# Patient Record
Sex: Female | Born: 1945 | Race: Black or African American | Hispanic: No | Smoking: Former smoker
Health system: Southern US, Community
[De-identification: ages and names within clinical notes are randomized; demographics above are authoritative.]

## PROBLEM LIST (undated history)

## (undated) DIAGNOSIS — Z923 Personal history of irradiation: Secondary | ICD-10-CM

## (undated) DIAGNOSIS — I219 Acute myocardial infarction, unspecified: Secondary | ICD-10-CM

## (undated) DIAGNOSIS — J984 Other disorders of lung: Secondary | ICD-10-CM

## (undated) DIAGNOSIS — Z951 Presence of aortocoronary bypass graft: Secondary | ICD-10-CM

## (undated) DIAGNOSIS — E785 Hyperlipidemia, unspecified: Secondary | ICD-10-CM

## (undated) DIAGNOSIS — J309 Allergic rhinitis, unspecified: Secondary | ICD-10-CM

## (undated) DIAGNOSIS — I10 Essential (primary) hypertension: Secondary | ICD-10-CM

## (undated) DIAGNOSIS — R51 Headache: Secondary | ICD-10-CM

## (undated) DIAGNOSIS — I2510 Atherosclerotic heart disease of native coronary artery without angina pectoris: Secondary | ICD-10-CM

## (undated) DIAGNOSIS — K219 Gastro-esophageal reflux disease without esophagitis: Secondary | ICD-10-CM

## (undated) HISTORY — DX: Other disorders of lung: J98.4

## (undated) HISTORY — DX: Acute myocardial infarction, unspecified: I21.9

## (undated) HISTORY — PX: CORONARY ARTERY BYPASS GRAFT: SHX141

## (undated) HISTORY — DX: Headache: R51

## (undated) HISTORY — DX: Atherosclerotic heart disease of native coronary artery without angina pectoris: I25.10

## (undated) HISTORY — DX: Allergic rhinitis, unspecified: J30.9

## (undated) HISTORY — DX: Personal history of irradiation: Z92.3

## (undated) HISTORY — DX: Hyperlipidemia, unspecified: E78.5

## (undated) HISTORY — PX: CARDIAC CATHETERIZATION: SHX172

## (undated) HISTORY — DX: Presence of aortocoronary bypass graft: Z95.1

## (undated) HISTORY — DX: Gastro-esophageal reflux disease without esophagitis: K21.9

## (undated) HISTORY — DX: Essential (primary) hypertension: I10

## (undated) HISTORY — PX: OTHER SURGICAL HISTORY: SHX169

---

## 2006-03-29 HISTORY — PX: CARDIAC DEFIBRILLATOR PLACEMENT: SHX171

## 2006-08-13 ENCOUNTER — Ambulatory Visit: Payer: Self-pay | Admitting: Emergency Medicine

## 2006-08-13 ENCOUNTER — Inpatient Hospital Stay (HOSPITAL_COMMUNITY): Admission: EM | Admit: 2006-08-13 | Discharge: 2006-08-23 | Payer: Self-pay | Admitting: Emergency Medicine

## 2006-08-14 ENCOUNTER — Ambulatory Visit: Payer: Self-pay | Admitting: Thoracic Surgery (Cardiothoracic Vascular Surgery)

## 2006-08-14 ENCOUNTER — Encounter (INDEPENDENT_AMBULATORY_CARE_PROVIDER_SITE_OTHER): Payer: Self-pay | Admitting: Cardiology

## 2006-08-18 ENCOUNTER — Encounter (INDEPENDENT_AMBULATORY_CARE_PROVIDER_SITE_OTHER): Payer: Self-pay | Admitting: Interventional Cardiology

## 2006-09-08 ENCOUNTER — Ambulatory Visit: Payer: Self-pay | Admitting: Thoracic Surgery (Cardiothoracic Vascular Surgery)

## 2006-10-16 ENCOUNTER — Encounter (HOSPITAL_COMMUNITY): Admission: RE | Admit: 2006-10-16 | Discharge: 2006-11-14 | Payer: Self-pay | Admitting: Interventional Cardiology

## 2006-10-21 ENCOUNTER — Encounter: Admission: RE | Admit: 2006-10-21 | Discharge: 2006-10-21 | Payer: Self-pay | Admitting: Internal Medicine

## 2006-10-27 ENCOUNTER — Ambulatory Visit: Payer: Self-pay | Admitting: Thoracic Surgery (Cardiothoracic Vascular Surgery)

## 2006-10-30 ENCOUNTER — Ambulatory Visit (HOSPITAL_COMMUNITY): Admission: RE | Admit: 2006-10-30 | Discharge: 2006-10-30 | Payer: Self-pay | Admitting: Interventional Cardiology

## 2007-02-11 ENCOUNTER — Ambulatory Visit: Payer: Self-pay | Admitting: Internal Medicine

## 2007-03-05 ENCOUNTER — Ambulatory Visit (HOSPITAL_COMMUNITY): Admission: RE | Admit: 2007-03-05 | Discharge: 2007-03-05 | Payer: Self-pay | Admitting: Gastroenterology

## 2007-03-30 ENCOUNTER — Encounter: Admission: RE | Admit: 2007-03-30 | Discharge: 2007-03-30 | Payer: Self-pay | Admitting: Internal Medicine

## 2007-04-07 ENCOUNTER — Encounter: Admission: RE | Admit: 2007-04-07 | Discharge: 2007-04-07 | Payer: Self-pay | Admitting: *Deleted

## 2007-05-12 ENCOUNTER — Ambulatory Visit: Payer: Self-pay | Admitting: Internal Medicine

## 2007-06-30 ENCOUNTER — Encounter: Payer: Self-pay | Admitting: Pulmonary Disease

## 2007-06-30 ENCOUNTER — Ambulatory Visit (HOSPITAL_COMMUNITY): Admission: RE | Admit: 2007-06-30 | Discharge: 2007-06-30 | Payer: Self-pay | Admitting: Gastroenterology

## 2007-07-03 ENCOUNTER — Ambulatory Visit (HOSPITAL_COMMUNITY): Admission: RE | Admit: 2007-07-03 | Discharge: 2007-07-03 | Payer: Self-pay | Admitting: Gastroenterology

## 2007-07-24 DIAGNOSIS — E782 Mixed hyperlipidemia: Secondary | ICD-10-CM | POA: Insufficient documentation

## 2007-07-24 DIAGNOSIS — K219 Gastro-esophageal reflux disease without esophagitis: Secondary | ICD-10-CM | POA: Insufficient documentation

## 2007-07-24 DIAGNOSIS — E785 Hyperlipidemia, unspecified: Secondary | ICD-10-CM

## 2007-07-24 DIAGNOSIS — I10 Essential (primary) hypertension: Secondary | ICD-10-CM | POA: Insufficient documentation

## 2007-07-27 ENCOUNTER — Ambulatory Visit: Payer: Self-pay | Admitting: Pulmonary Disease

## 2007-07-27 DIAGNOSIS — R51 Headache: Secondary | ICD-10-CM

## 2007-07-27 DIAGNOSIS — I2510 Atherosclerotic heart disease of native coronary artery without angina pectoris: Secondary | ICD-10-CM

## 2007-07-27 DIAGNOSIS — J309 Allergic rhinitis, unspecified: Secondary | ICD-10-CM

## 2007-07-27 DIAGNOSIS — I25119 Atherosclerotic heart disease of native coronary artery with unspecified angina pectoris: Secondary | ICD-10-CM | POA: Insufficient documentation

## 2007-07-27 DIAGNOSIS — J302 Other seasonal allergic rhinitis: Secondary | ICD-10-CM | POA: Insufficient documentation

## 2007-07-27 DIAGNOSIS — J984 Other disorders of lung: Secondary | ICD-10-CM

## 2007-07-27 DIAGNOSIS — R519 Headache, unspecified: Secondary | ICD-10-CM | POA: Insufficient documentation

## 2007-08-14 ENCOUNTER — Ambulatory Visit: Payer: Self-pay | Admitting: Internal Medicine

## 2007-09-29 ENCOUNTER — Ambulatory Visit (HOSPITAL_COMMUNITY): Admission: RE | Admit: 2007-09-29 | Discharge: 2007-09-29 | Payer: Self-pay | Admitting: Pulmonary Disease

## 2007-10-06 ENCOUNTER — Encounter: Payer: Self-pay | Admitting: Pulmonary Disease

## 2007-10-26 ENCOUNTER — Ambulatory Visit: Payer: Self-pay | Admitting: *Deleted

## 2007-10-27 ENCOUNTER — Inpatient Hospital Stay (HOSPITAL_COMMUNITY): Admission: EM | Admit: 2007-10-27 | Discharge: 2007-10-28 | Payer: Self-pay | Admitting: Emergency Medicine

## 2008-01-26 ENCOUNTER — Ambulatory Visit: Payer: Self-pay | Admitting: Cardiology

## 2008-02-11 ENCOUNTER — Ambulatory Visit: Payer: Self-pay | Admitting: Internal Medicine

## 2008-05-26 ENCOUNTER — Encounter: Payer: Self-pay | Admitting: Internal Medicine

## 2008-08-04 ENCOUNTER — Encounter: Payer: Self-pay | Admitting: Pulmonary Disease

## 2008-08-08 ENCOUNTER — Encounter: Payer: Self-pay | Admitting: Pulmonary Disease

## 2008-08-11 ENCOUNTER — Ambulatory Visit: Payer: Self-pay | Admitting: Internal Medicine

## 2008-08-25 ENCOUNTER — Encounter: Payer: Self-pay | Admitting: Internal Medicine

## 2008-09-13 ENCOUNTER — Encounter: Payer: Self-pay | Admitting: Internal Medicine

## 2008-09-15 ENCOUNTER — Encounter: Payer: Self-pay | Admitting: Internal Medicine

## 2008-10-16 DIAGNOSIS — I4901 Ventricular fibrillation: Secondary | ICD-10-CM

## 2008-10-27 DEATH — deceased

## 2008-11-10 ENCOUNTER — Ambulatory Visit: Payer: Self-pay | Admitting: Internal Medicine

## 2008-11-10 ENCOUNTER — Encounter: Payer: Self-pay | Admitting: Internal Medicine

## 2008-11-16 ENCOUNTER — Encounter: Payer: Self-pay | Admitting: Internal Medicine

## 2008-11-16 ENCOUNTER — Encounter (INDEPENDENT_AMBULATORY_CARE_PROVIDER_SITE_OTHER): Payer: Self-pay | Admitting: *Deleted

## 2009-01-11 ENCOUNTER — Encounter: Payer: Self-pay | Admitting: Pulmonary Disease

## 2009-01-26 ENCOUNTER — Ambulatory Visit: Payer: Self-pay | Admitting: Internal Medicine

## 2009-06-07 ENCOUNTER — Encounter: Admission: RE | Admit: 2009-06-07 | Discharge: 2009-06-07 | Payer: Self-pay | Admitting: Interventional Cardiology

## 2009-08-10 ENCOUNTER — Ambulatory Visit: Payer: Self-pay | Admitting: Internal Medicine

## 2009-08-16 ENCOUNTER — Encounter: Payer: Self-pay | Admitting: Internal Medicine

## 2009-10-26 ENCOUNTER — Ambulatory Visit: Payer: Self-pay | Admitting: Internal Medicine

## 2009-10-26 DIAGNOSIS — R0609 Other forms of dyspnea: Secondary | ICD-10-CM

## 2009-10-26 DIAGNOSIS — F17200 Nicotine dependence, unspecified, uncomplicated: Secondary | ICD-10-CM | POA: Insufficient documentation

## 2009-10-26 DIAGNOSIS — J449 Chronic obstructive pulmonary disease, unspecified: Secondary | ICD-10-CM | POA: Insufficient documentation

## 2009-10-26 DIAGNOSIS — R0989 Other specified symptoms and signs involving the circulatory and respiratory systems: Secondary | ICD-10-CM

## 2010-01-25 ENCOUNTER — Encounter: Payer: Self-pay | Admitting: Internal Medicine

## 2010-02-01 ENCOUNTER — Ambulatory Visit: Payer: Self-pay

## 2010-04-06 ENCOUNTER — Emergency Department (HOSPITAL_COMMUNITY)
Admission: EM | Admit: 2010-04-06 | Discharge: 2010-04-06 | Payer: Self-pay | Source: Home / Self Care | Admitting: Emergency Medicine

## 2010-05-03 ENCOUNTER — Ambulatory Visit
Admission: RE | Admit: 2010-05-03 | Discharge: 2010-05-03 | Payer: Self-pay | Source: Home / Self Care | Attending: Internal Medicine | Admitting: Internal Medicine

## 2010-05-03 ENCOUNTER — Encounter: Payer: Self-pay | Admitting: Internal Medicine

## 2010-05-20 ENCOUNTER — Encounter: Payer: Self-pay | Admitting: Internal Medicine

## 2010-05-20 ENCOUNTER — Encounter: Payer: Self-pay | Admitting: Pulmonary Disease

## 2010-05-29 NOTE — Cardiovascular Report (Signed)
Summary: Office Visit   Office Visit   Imported By: Roderic Ovens 11/01/2009 15:42:57  _____________________________________________________________________  External Attachment:    Type:   Image     Comment:   External Document

## 2010-05-29 NOTE — Cardiovascular Report (Signed)
Summary: Office Visit Remote  Office Visit Remote   Imported By: Roderic Ovens 08/16/2009 14:19:34  _____________________________________________________________________  External Attachment:    Type:   Image     Comment:   External Document

## 2010-05-29 NOTE — Letter (Signed)
Summary: Remote Device Check  [facility], Main Office  [address]   [place], [place] [address]   Phone: [phone]  Fax: [phone]     August 16, 2009 MRN: [id number]   Karen [name] [address]  [address] [place], [place]  [address]   Dear Karen [name],   Your remote transmission was recieved and reviewed by your physician.  All diagnostics were within normal limits for you.    ___X___Your next office visit is scheduled for:   YOU ARE OVERDUE FOR AN APPOINTMENT WITH DR Graciela Husbands. Please call our office to schedule an appointment.    Sincerely,  Proofreader

## 2010-05-29 NOTE — Assessment & Plan Note (Signed)
Summary: rov   Visit Type:  Follow-up Referring Provider:  Everette Rank Primary Provider:  Fleet Contras  CC:  no complaints.  History of Present Illness: Mrs. Falkenstein is seen in followup for ICD implantation for primary prevention in the setting of ischemic heart disease. She underwent bypass surgery emergently on presentation with MI and associated VF.  She has significant complaints of dyspnea on exertion limited to vacuuming, ADLs, and certainly a flight of stairs. This is better compared to 2008 prior to surgery. She sees Dr. Hedy Jacob and has recently. I will be in touch with him regarding a recent evaluation.  She has some nocturnal dyspnea; peripheral edema has not been an issue. She does have ongoing complaints of chest discomfort  Current Medications (verified): 1)  Furosemide 20 Mg  Tabs (Furosemide) .... Once Daily 2)  Metoprolol Tartrate 25 Mg Tabs (Metoprolol Tartrate) .Marland Kitchen.. 1 By Mouth Two Times A Day 3)  Bayer Aspirin 325 Mg  Tabs (Aspirin) .... Once Daily 4)  Plavix 75 Mg  Tabs (Clopidogrel Bisulfate) .... Once Daily 5)  Ranexa 500 Mg  Tb12 (Ranolazine) .... Two Times A Day 6)  Lisinopril 10 Mg  Tabs (Lisinopril) .... Once Daily 7)  Simvastatin 40 Mg  Tabs (Simvastatin) .Marland Kitchen.. 1 1/2 By Mouth Daily 8)  Nexium 40 Mg  Cpdr (Esomeprazole Magnesium) .... Once Daily 9)  Nitroquick 0.4 Mg  Subl (Nitroglycerin) .... Use As Directed As Needed 10)  Imdur 120 Mg Xr24h-Tab (Isosorbide Mononitrate) .Marland Kitchen.. 1 By Mouth Daily 11)  Amlodipine Besylate 5 Mg Tabs (Amlodipine Besylate) .Marland Kitchen.. 1 By Mouth Daily  Allergies (verified): 1)  ! Codeine  Vital Signs:  Patient profile:   65 year old female Height:      63 inches Weight:      177 pounds BMI:     31.47 Pulse rate:   77 / minute Resp:     18 per minute BP sitting:   105 / 71  (left arm)  Vitals Entered By: Marrion Coy, CNA (October 26, 2009 9:09 AM)  Physical Exam  General:  The patient was alert and oriented in no acute  distress. HEENT Normal.  Neck veins were flat, carotids were brisk.  Lungs were clear.  Heart sounds were regular without murmurs or gallops.  Abdomen was soft with active bowel sounds. There is no clubbing cyanosis or edema. Skin Warm and dry     ICD Specifications Following MD:  Sherryl Manges, MD     ICD Vendor:  [facility] [employer]     ICD Model Number:  T175     ICD Serial Number:  [id number] ICD DOI:  04/02/2006     ICD Implanting MD:  Sherryl Manges, MD  Lead 1:    Location: RV     DOI: 04/02/2006     Model #: [id number]     Serial #: [id number]     Status: active  Indications::  NICM   ICD Follow Up Remote Check?  No Battery Voltage:  2.98 V     Charge Time:  7.9 seconds     Underlying rhythm:  SR ICD Dependent:  No       ICD Device Measurements Right Ventricle:  Amplitude: 9.1 mV, Impedance: 500 ohms, Threshold: 1.0 V at 0.4 msec Shock Impedance: 60 ohms   Episodes Shock:  0     ATP:  0     Nonsustained:  0     Ventricular Pacing:  0%  Brady Parameters Mode VVI  Lower Rate Limit:  40      Tachy Zones VF:  185     Next Remote Date:  01/25/2010     Next Cardiology Appt Due:  09/28/2010 Tech Comments:  Normal device function.  No changes made today.  Pt does Latitude transmissions.  ROV 12 months SK. Gypsy Balsam RN BSN  October 26, 2009 9:24 AM   Impression & Recommendations:  Problem # 1:  IMPLANTABLE DEFIBRILLATOR GDT (ICD-V45.02) Device parameters and data were reviewed and no changes were made  Problem # 2:  DYSPNEA ON EXERTION (ICD-786.09) This is her major concern. The differential could include ischemia, congestive heart failure, and unfortunately COPD as she continues to smoke cigarettes,, albeit infrequently.  We will track down her most recent echo done by Dr. Hedy Jacob as well as an ECG to determine whether there is any potential role for CRT upgrade. For now we'll continue her on her current medications Her updated medication list for this problem includes:    Furosemide 20 Mg  Tabs (Furosemide) ..... Once daily    Metoprolol Tartrate 25 Mg Tabs (Metoprolol tartrate) .Marland Kitchen... 1 by mouth two times a day    Bayer Aspirin 325 Mg Tabs (Aspirin) ..... Once daily    Lisinopril 10 Mg Tabs (Lisinopril) ..... Once daily    Amlodipine Besylate 5 Mg Tabs (Amlodipine besylate) .Marland Kitchen... 1 by mouth daily  Problem # 3:  CORONARY ARTERY BYPASS GRAFT, NORMAL LV FUNCTION (ICD-V45.81) as above; will obtain an echo  Problem # 4:  TOBACCO ABUSE (ICD-305.1) have encouraged her to stop smoking a little bit that she does.  Patient Instructions: 1)  Your physician wants you to follow-up in: 12 months with Dr Graciela Husbands.  You will receive a reminder letter in the mail two months in advance. If you don't receive a letter, please call our office to schedule the follow-up appointment.

## 2010-05-29 NOTE — Cardiovascular Report (Signed)
Summary: Office Visit Remote   Office Visit Remote   Imported By: Roderic Ovens 03/08/2010 15:19:36  _____________________________________________________________________  External Attachment:    Type:   Image     Comment:   External Document

## 2010-06-20 NOTE — Cardiovascular Report (Signed)
Summary: Office Visit Remote   Office Visit Remote   Imported By: Roderic Ovens 06/12/2010 14:27:23  _____________________________________________________________________  External Attachment:    Type:   Image     Comment:   External Document

## 2010-06-21 ENCOUNTER — Other Ambulatory Visit: Payer: Self-pay | Admitting: Internal Medicine

## 2010-06-21 DIAGNOSIS — Z1231 Encounter for screening mammogram for malignant neoplasm of breast: Secondary | ICD-10-CM

## 2010-07-11 ENCOUNTER — Ambulatory Visit
Admission: RE | Admit: 2010-07-11 | Discharge: 2010-07-11 | Disposition: A | Discharge status: Home / Self Care | Payer: Medicaid Other | Source: Ambulatory Visit | Attending: Internal Medicine | Admitting: Internal Medicine

## 2010-07-11 DIAGNOSIS — Z1231 Encounter for screening mammogram for malignant neoplasm of breast: Secondary | ICD-10-CM

## 2010-08-02 ENCOUNTER — Ambulatory Visit (INDEPENDENT_AMBULATORY_CARE_PROVIDER_SITE_OTHER): Payer: Medicaid Other | Admitting: *Deleted

## 2010-08-02 DIAGNOSIS — I428 Other cardiomyopathies: Secondary | ICD-10-CM

## 2010-08-02 DIAGNOSIS — R0989 Other specified symptoms and signs involving the circulatory and respiratory systems: Secondary | ICD-10-CM

## 2010-08-02 DIAGNOSIS — Z95810 Presence of automatic (implantable) cardiac defibrillator: Secondary | ICD-10-CM

## 2010-08-03 ENCOUNTER — Other Ambulatory Visit: Payer: Self-pay

## 2010-08-07 NOTE — Progress Notes (Signed)
icd remote check  

## 2010-08-19 ENCOUNTER — Encounter: Payer: Self-pay | Admitting: *Deleted

## 2010-09-11 NOTE — Assessment & Plan Note (Signed)
OFFICE VISIT   AUBREYANA, SALTZ  DOB:  Jul 11, 1945                                        October 27, 2006  CHART #:  [id number]   HISTORY OF PRESENT ILLNESS:  Ms. Curtice returns for further follow up  status post emergency coronary artery bypass grafting x4 on August 14, 2006 for critical left main disease status post acute myocardial  infarction with ventricular fibrillation arrest.  She was last seen here  in the office on Sep 08, 2006.  Since then, she has continued to improve  overall, and she was seen in follow up by Dr. Eldridge Dace approximately 2  weeks ago.  She has had some chest pains off and on since surgery, some  of which sounds to be related to her surgery and her mediansternotomy.  However, she now describes some exertional chest pain that sounds more  worrisome for angina.  She states that over the last 2 weeks she has  been involved in the cardiac rehabilitation program.  When she gets on  the treadmill, she will sometimes develop some burning pain in the  epigastric region.  This usually is relieved by rest, although with more  severe episodes she has taken sublingual nitroglycerin for it to  resolve.  She denies any prolonged episodes of pain, although she has  had some occasionally mild episodes of pain in bed when she is trying to  go to sleep in the evening.  The pain is associated with some mild  shortness of breath.  She has not had any tachypalpitations or dizzy  spells.  She has not had any prolonged episodes of pain.  Her appetite  is good.  She notes that she had been taking some Nexium, and she  thought this was improving her symptoms, but she no longer thinks this  is the case.   REVIEW OF SYSTEMS:  The remainder of her review of systems is  unremarkable.   MEDICATIONS:  Remain unchanged from her last examination.   PHYSICAL EXAMINATION:  GENERAL:  A well-appearing female with blood  pressure of 133/80, pulse 75, oxygen 99% on  room air.  CHEST:  Notable for a mediansternotomy incision that has healed nicely.  The sternum is stable on palpation.  LUNGS:  Breath sounds are clear to auscultation and symmetrical  bilaterally.  No wheezes or rhonchi are noted.  CARDIOVASCULAR:  Regular rate and rhythm.  No murmurs, rubs, or gallops  are appreciated.  ABDOMEN:  Soft and nontender.  EXTREMITIES:  Warm and well perfused.  There is no lower extremity  edema.  (The remainder of her physical exam is unrevealing.)   IMPRESSION:  I am concerned that Ms. Losasso may have recurrence of  symptoms of angina.  It is difficult to sort out some of her symptoms,  and it is possible that she may have symptoms related to GE reflux  disease and/or some residual postoperative discomfort related to her  recent surgery.  However, her exertional chest pain that she describes  presently sounds fairly convincing for angina pectoris.  As she has  become more active, the symptoms have become more noticeable to her.  She was noted to have diffuse coronary artery disease with relatively  poor target vessels for grafting at the time of surgery, particularly  the distal right coronary artery and its  branches.  All of her coronary  arteries were relatively small and diffusely diseased.  Under the  circumstances it might make sense to proceed with repeat  catheterization, or, at the very least, a follow up stress test.   PLAN:  I have discussed matters over the telephone with Dr. Eldridge Dace,  who plans to contact Ms. Grega and get her in promptly this week for  further evaluation.  She remains on aspirin, Plavix, isosorbide  mononitrate and metoprolol.  She continues on simvastatin, as well.  She  will continue to use sublingual nitroglycerin as needed, and she knows  that if she has any prolonged episodes of pain unrelieved by  nitroglycerin, she will summon EMS and present to the emergency room as  soon as possible.  All of her questions  have been addressed.   Salvatore Decent. Cornelius Moras, M.D.  Electronically Signed   CHO/MEDQ  D:  10/27/2006  T:  10/28/2006  Job:  [id number]   cc:   Corky Crafts, MD

## 2010-09-11 NOTE — Cardiovascular Report (Signed)
NAME:  Karen [name], Karen [name] NO.:  [id number]   MEDICAL RECORD NO.:  [id number]          PATIENT TYPE:  OIB   LOCATION:  2899                         FACILITY:  [facility]   PHYSICIAN:  Corky Crafts, MDDATE OF BIRTH:  1945/05/11   DATE OF PROCEDURE:  10/30/2006  DATE OF DISCHARGE:                            CARDIAC CATHETERIZATION   REFERRING PHYSICIAN:  Salvatore Decent. Cornelius Moras, M.D. and Fleet Contras, M.D.   PROCEDURES PERFORMED:  Left heart catheterization, left ventriculogram,  coronary angiogram, abdominal aortogram, arterial conduit angiogram, SVG  angiogram.   OPERATOR:  Corky Crafts, M.D.   INDICATIONS:  Stable angina.   PROCEDURE:  The risks and benefits of cardiac catheterization were  explained to the patient and informed consent was obtained. The patient  was brought to the catheterization lab. She was prepped and draped in  the usual sterile fashion.  Her right groin was infiltrated 1%  lidocaine, 6-French arterial sheath was placed into the right femoral  artery using modified Seldinger technique. A JL-4.0 catheter was used to  perform left coronary artery angiography.  The catheter was advanced the  ascending aorta and the vessel under fluoroscopic guidance.  Digital  angiography was performed in multiple projections using hand injection  of contrast. The right coronary artery angiography was then performed  using a 6-French JR-4.0 catheter.  The catheter was advanced to the  vessel ostium under fluoroscopic guidance.  Digital angiography was  performed in multiple projections using hand injection of contrast. The  JR-4.0 catheter was then used to engage the SVG to diagonal and SVG to  OM and could not successfully be used to engage the SVG to RCA. The JR-4  was pulled back and placed in the subclavian. An exchange-length wire  was placed into the subclavian vessel.  The catheter was exchanged for  an IMA catheter. This catheter was removed. An RCB  catheter was then  inserted to localize the graft to the right coronary artery.  The  pigtail catheter was then advanced to the ascending aorta and across the  aortic valve.  Power injection of contrast on the RAO projection to  image the left ventricle.  The catheter was pulled back under continuous  hemodynamic pressure monitoring for a pullback. Catheter was then  withdrawn to the level of renal arteries.  Power injection of contrast  on the AP projection to visualize the renal arteries. Because of  question of ostial disease of the right, a 4-French JR-4.0 catheter was  then advanced to the ascending aorta and used to engage the right  coronary artery.  There is no significant ostial disease however, the  midportion of the vessel appeared occluded.  200 mcg intracoronary  nitroglycerin were administered and then subsequently angiography showed  that the right coronary artery was patent.   FINDINGS:  The left main is widely patent, left circumflex is a large  vessel, the OM-1 is large vessel with mild irregularities, OM-2 patent  proximally and subtotally occluded distally.  There are left-to-left  collaterals which weakly fill the distal vessel. Left anterior  descending is a large vessel with  mild irregularities. Flow to the LIMA  is noted.  There is also competitive flow in the first diagonal. The  right coronary artery is a medium-size dominant vessel with moderate  atherosclerosis throughout.  There is a diffusely diseased PDA  posterolateral branch bifurcation.  The SVG to first diagonal is widely  patent.  There is mild disease at the ostium and at the anastomosis. The  SVG to OM-2 is atretic without any significant forward flow.  The LIMA  to LAD is widely patent.  The SVG to RCA also has mild irregularities  proximally but is widely patent. Left ventriculogram shows mild  inferobasal hypokinesis.  Overall there is normal ventricular function  with estimated ejection fraction  of 60%. The abdominal aortogram shows a  small infrarenal abdominal aortic aneurysm.  There are single renal  arteries bilaterally which are both widely patent.  With the 4-French JR-  4 catheter no significant ostial RCA disease was found. The mid RCA  opened with nitroglycerin as well.   HEMODYNAMIC RESULTS:  145/10 was left ventricular pressure, LVEDP of 13,  aortic pressure 153/76 with a mean aortic pressure of 106.   IMPRESSION:  1. Patent native left sided coronary circulation except for the distal      OM-2 which is occluded.  There are weak collaterals which fill the      distal vessel.  2. Patent right coronary artery with moderate disease.  3. Patent left internal mammary artery to left anterior descending,      patent saphenous vein graft to D1, patent saphenous vein graft to      right coronary artery, occluded saphenous vein graft to obtuse      marginal 2.  4. Normal ventricular function.  5. No renal artery stenosis.  6. Small infrarenal abdominal aortic aneurysm.   RECOMMENDATIONS:  Continue aggressive medical therapy including beta  blocker, nitrate, ACE inhibitor, statin. Ranexa was also recently added.  If the patient continues to have persistent pain, we would consider  attempted revascularization of that distal OM-2 which is occluded.  However, this is a small territory and appears to be a small distal  vessel. Hopefully medical management will be adequate.      Corky Crafts, MD  Electronically Signed     JSV/MEDQ  D:  10/30/2006  T:  10/30/2006  Job:  [id number]

## 2010-09-11 NOTE — H&P (Signed)
NAME:  Karen [name], Karen NO.:  [id number]   MEDICAL RECORD NO.:  [id number]          PATIENT TYPE:  INP   LOCATION:  2927                         FACILITY:  [facility]   PHYSICIAN:  Audery Amel, MD    DATE OF BIRTH:  1946-01-08   DATE OF ADMISSION:  10/27/2007  DATE OF DISCHARGE:                              HISTORY & PHYSICAL   PRIMARY CARDIOLOGIST:  Dr. Eldridge Dace.   CHIEF COMPLAINT:  Chest pain.   HISTORY OF PRESENT ILLNESS:  Karen [name] is a 65 year old African  American female with a history of coronary artery disease status post  CABG who presents to the emergency department for further evaluation of  chest discomfort.  The patient states her symptoms initially began on  Saturday with exertion.  The patient did not seek medical attention at  that time and her symptoms spontaneously resolved.  Her symptoms  appeared again early Monday morning and actually awoke her from sleep.  She characterizes a sharp substernal chest pain radiating to the left  arm.  She did endorse associated shortness of breath, nausea and  palpitations.  She denied any presyncope or syncope.  The patient has  taken approximately five to six sublingual nitroglycerins throughout the  day.  She states that his symptoms initially to respond.  However, with  additional exertion, they promptly returned.  On arrival to the  emergency department, the patient was continued to have chest  discomfort.  Again this was improved with nitroglycerin.  Her initial  EKG revealed normal sinus rhythm with no evidence of acute injury or  ischemia.  There was evidence of prior inferior infarction.  Initial  cardiac biomarkers of point care testing were negative times one.  Of  note, while attending her brother's funeral in [place], the patient  was admitted to a local hospital there.  There she was told she had a  95% coronary artery blockage and underwent percutaneous intervention.  The patient has been  on aspirin and Plavix and presumably received a  drug-eluting stent at that time.  Otherwise, she is without complaints.   PAST MEDICAL HISTORY:  1. CAD.      a.     Status post CABG.      b.     Status post PCI within the last month in [place] .  2. 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  3. Hypertension.  4. Hyperlipidemia.  5. GERD.  6. History of TAH.  7. Status post lap chole.   ALLERGIES:  CODEINE.   CURRENT MEDICATIONS:  1. Aspirin 81 mg daily  2. Imdur 60 mg daily  3. Lasix 20 mg daily  4. Lisinopril 40 mg daily  5. Metoprolol 50 mg daily  6. Nitroglycerin sublingual p.r.n.  7. Omeprazole 20 mg b.i.d.  8. Plavix 75 mg once daily  9. Procardia XL 30 mg once daily   SOCIAL HISTORY:  The patient lives in [place] with her son.  She  denies any tobacco, alcohol or illicit substance.   FAMILY HISTORY:  There is a general diffuse history of coronary disease  in her family.   REVIEW OF SYSTEMS:  As per HPI, otherwise complete review of systems was  obtained and is negative except as documented.   PHYSICAL EXAMINATION:  VITAL SIGNS:  Blood pressure 149/79, heart rate  62, O2 sats 100% on 2 liters nasal cannula.  GENERAL:  The patient is alert and oriented x3 in moderate amount of  distress.  HEENT: Normocephalic, atraumatic, EOMI, PERL, nares patent, OP is clear  without erythema or exudate.  NECK:  Supple, full range of motion, no appreciable JVD.  There is no  palpable thyromegaly or lymphadenopathy.  CARDIOVASCULAR:  Normal S1-S2.  No audible murmurs, rubs or gallops.  PMI is nondisplaced in left midclavicular line.  She has a well-healed  median sternotomy scar.  Peripheral pulses are 1+ and symmetric  bilaterally.  LUNGS:  Clear to auscultation bilaterally.  SKIN:  No rash or lesions noted.  ABDOMEN:  Obese, soft, nontender, nondistended.  Positive bowel sounds.  No hepatosplenomegaly.  EXTREMITIES:   Reveal no clubbing, cyanosis or edema.  MUSCULOSKELETAL:  No joint deformity or effusions.  NEUROLOGIC:  Grossly nonfocal.   STUDIES:  EKG normal sinus rhythm with office nonspecific ST-T wave  changes.  There is no evidence of acute injury ischemia.  She does have  inferior Q-waves consistent with a prior inferior myocardial infarction.   LABORATORY DATA:  White count 5.0, hematocrit 38.9, platelet count 178.  Sodium 140, potassium 3.6, chloride 105, CO2 is 25, BUN is 10,  creatinine 1.1, glucose 128, CK-MB 1.5, troponin I 0.06.   IMPRESSION:  1. Unstable angina.  2. History of coronary artery disease status post coronary artery      bypass graft.  3. History of VF arrest status post AICD.  4. Hypertension.  5. Hyperlipidemia.  6. Gastroesophageal reflux disease.   PLAN:  Will admit the patient to the telemetry bed for further  monitoring and evaluation.  Will cycle cardiac biomarkers to rule out  myocardial infarction.  Her symptoms are somewhat unusual in that while  I was examining the patient in the emergency department, she began to  belch numerous times claiming relief of her symptoms.  Despite this, the  patient has extensive coronary disease, and therefore, cardiac etiology  has to be presumed until ruled out.  I will continue her in fractionated  heparin drip, and I do not currently see an indication for a 2B3  inhibitor.  The patient did recently receive a stent in [place],  and is currently on aspirin and Plavix which we will continue throughout  her hospital course.  Her blood pressure is somewhat elevated at this  time.  However, given that she is having ongoing chest pain, we will  continue her nitroglycerin drip and titrate for effect.  Will continue  metoprolol 25 mg p.o. b.i.d.., as well as lisinopril 40 mg once daily.  Will check her fasting lipid profile and initiate statin therapy.  We  will keep the patient n.p.o. after midnight in anticipation of  possible  cardiac catheterization in the a.m..      Audery Amel, MD  Electronically Signed     SHG/MEDQ  D:  10/27/2007  T:  10/27/2007  Job:  [id number]

## 2010-09-11 NOTE — Assessment & Plan Note (Signed)
[facility]                         ELECTROPHYSIOLOGY OFFICE NOTE   Karen [name], Karen [name]                       MRN:          [id number]  DATE:05/12/2007                            DOB:          1945-10-15    [name] is seen following ICD implantation for ischemic heart  disease and LV dysfunction.  She has had no intercurrent episodes.  She  is having no chest pain or shortness of breath.   Her medications are reviewed and are unchanged.  On examination, her  blood pressure is 156/83.  Her pulse is 64 but she did not take her  blood pressure medications this morning.  Her lungs were clear, heart  sounds were regular, and extremities were without edema.   Interrogation of her Guidant ICD demonstrates an R wave of 11.2 with  impedance of 494, a threshold of 0.8 at 0.4.  Battery voltage was 3.22.  No intercurrent episodes were noted.   IMPRESSION:  1. Ischemic cardiomyopathy.  2. Status post internal cardioverter-defibrillator for primary      prevention.   [name] is stable.  We will see her again in one year's time and  she will be followed remotely in the interim.     [name] Salvia, MD, [facility]  Electronically Signed    SCK/MedQ  DD: 05/12/2007  DT: 05/12/2007  Job #: [id number]   cc:   Corky Crafts, MD

## 2010-09-11 NOTE — Letter (Signed)
February 11, 2007    Corky Crafts, MD  [address]  [place], [place] [address]   RE:  Karen, [name]  MRN:  [id number]  /  DOB:  January 31, 1946   Dear Vonna Kotyk:   It was a pleasure to see Karen [name] at your request to establish ICD  followup.   As you know, she is a 65 year old woman with a history of ischemic heart  disease with multiple MI's, and heart attacks, although interestingly,  she has normal left ventricular function.  She had an MI accompanied by  ventricular fibrillation in [place].  The details of this are not  available, but apparently she underwent EP testing, and was found to  have easily inducible VT/VF, and a defibrillator was implanted.  She has  had no intercurrent discharges.   She did, however, have a heart attack in April where she presented with  occlusion of her left main.  Thanks to obviously quick work by you and  Dr. Katrinka Blazing, her left main was stented, and Dr. Cornelius Moras was able to do  emergency surgery.  She survived with normal left ventricular function.  She subsequently had a repeat catheterization in July demonstrated  patency of the grafts, and there is some disease in her OM-2, but  otherwise had normal left ventricular function.  She was started on  Ranexa, and has done really quite well from a chest pain/breathing point  of view.  She has had no palpitations, and no syncope.   Lower extremity claudication is somewhat improved since stopping  cigarettes in April.   MEDICATIONS:  Include:  1. Nexium.  2. Ecotrin.  3. Plavix.  4. Metoprolol succinate 100.  5. Ranexa 500 twice daily.  6. Simvastatin 40.  7. Nitroglycerin.  8. Lisinopril 10.  9. Furosemide 20 daily.  10.Isosorbide 60 daily.   SHE IS ALLERGIC TO CODEINE.   SOCIAL HISTORY:  She is married.  She has 4 children, 2 live in  [place], 2 live in [place], and she has moved from there to here  because of family.  She denies cigarettes, alcohol,  or recreational  drugs at this point (above).  She is walking daily.   PAST MEDICAL HISTORY:  In addition to the above, is notable for:  1. Hypertension.  2. Hypercholesterolemia.  3. Gastroesophageal reflux disease and peptic ulcer disease.  4. Cancer.  Gynecological.   PAST SURGICAL HISTORY:  Notable for:  1. Hysterectomy at the time of her gynecological cancer.  She is not      sure if it was ovarian or not.  2. Low back surgery.  3. Cardiac surgery.   EXAMINATION:  She is a older [place] American female appearing her  stated age of 81.  Her blood pressure is 123/85.  Her pulse is 90.  Her weight was 170.  HEENT EXAM:  Demonstrated no xanthoma.  NECK:  Veins were flat.  The carotids were brisk and full bilaterally  without bruits.  BACK:  Without kyphosis or scoliosis.  LUNGS:  Were clear.  HEART:  Sounds were regular without murmurs or gallops.  ABDOMEN:  Soft with active bowel sounds without midline pulsation or  hepatomegaly.  Femoral pulses were 2+.  Distal pulses were intact.  There is no  clubbing, cyanosis, or edema.  NEUROLOGICAL EXAM:  Grossly normal.  SKIN:  Warm and dry.   Interrogation of her Guidant ICD demonstrated a single-chamber  defibrillator, model T175 with an R wave of 14.6,  impendence of 557.  The thresholds were 0.8 and 0.4.  Battery voltage was 3.23.  There were  no intercurrent episodes.  It was programmed as a single-zone device.   IMPRESSION:  1. Ischemic heart disease.      a.     Prior myocardial infarction.      b.     Normal left ventricular function.      c.     Status post emergency bypass April 2008.  2. 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  3. Peripheral claudication.  Improved.   [name], [name], has a stable rhythm issue.  We will plan to try and  get her set up on [facility]'s Latitude Remote System, and  we  will see her again in 3 months trying to make sure that is established,  and then we will see her annually after that.   Thanks very much for allowing [place] to participate in her care.    Sincerely,      [name] Salvia, MD, [facility]  Electronically Signed    SCK/MedQ  DD: 02/11/2007  DT: 02/12/2007  Job #: [id number]   CC:    Evelina Bucy, M.D.

## 2010-09-14 NOTE — Consult Note (Signed)
NAME:  Karen [name], Karen [name] NO.:  [id number]   MEDICAL RECORD NO.:  [id number]          PATIENT TYPE:  INP   LOCATION:  2307                         FACILITY:  [facility]   PHYSICIAN:  Salvatore Decent. Cornelius Moras, M.D. DATE OF BIRTH:  [birth year]   DATE OF CONSULTATION:  08/14/2006  DATE OF DISCHARGE:                                 CONSULTATION   REQUESTING PHYSICIAN:  Dr. Everette Rank.   REASON FOR CONSULTATION:  Left main disease with three-vessel coronary  artery disease.   HISTORY OF PRESENT ILLNESS:  Karen [name] is a 65 year old African-  American female who recently relocated to [place] from [place],  [place].  She apparently has known history of severe coronary  artery disease with ischemic cardiomyopathy, including previous  myocardial infarction in December 2007, as well as placement of an  implantable defibrillator at that time.  She had previously undergone  percutaneous coronary intervention with stenting, some time in 2006.  The details of her cardiac history are otherwise completely unknown,  including any documentation of previous left ventricular dysfunction or  coronary anatomy.  The patient presented acutely to the hospital  yesterday, with acute coronary syndrome.  She was awoken from her sleep  at 4:30 in the morning on April 16, with substernal chest pain  associated with shortness of breath, diaphoresis, nausea and dizziness.  She took sublingual nitroglycerin with some relief.  Presented to the  emergency room, where she was started on IV nitroglycerin and Heparin.  A 12-lead electrocardiogram at that time revealed sinus rhythm with  first-degree AV block and no acute ischemic changes.  The initial set of  cardiac enzymes were negative.  She was pain-free at the time of her  initial evaluation.  She was admitted with the diagnosis of unstable  angina and scheduled for elective cardiac catheterization this morning.   Apparently, upon arrival in  the cath lab earlier this morning, the  patient reported ongoing symptoms of chest pain.  Telemetry demonstrated  inferior ST-segment elevation, and the patient was notably diaphoretic.  She promptly became unresponsive and went into pulseless electrical  activity, without any associated blood pressure.  She underwent prompt  cardiac catheterization demonstrating left main occlusion.  This was  followed with acute PCI of the left main coronary artery, re-  establishing some flow into the left coronary system.  Intra-aortic  balloon pump was placed, and the patient was further intubated and  resuscitated throughout the case, with intermittent closed chest  compressions.  By the end of the procedure, the patient was maintaining  stable blood pressure, and she was transferred to the intensive care  unit in critical condition.  Cardiac surgical consultation has now been  requested to assist with her evaluation and management.   REVIEW OF SYSTEMS:  Is not obtainable.  The patient is sedated on the  ventilator.  She is arousable and will follow some simple commands.   PAST MEDICAL HISTORY:  Remains somewhat unclear.  The patient has a  known history of coronary artery disease with ischemic cardiomyopathy  and previous implantable defibrillator placement.  The patient also  has  history of hypertension, hyperlipidemia, and GE reflux disease.  There  is no documented history of diabetes.   PAST SURGICAL HISTORY:  1. Total abdominal hysterectomy.  2. Back surgery, details unclear.  3. Cholecystectomy.   MEDICATIONS PRIOR TO ADMISSION:  1. Aspirin 325 mg daily.  2. Imdur twice daily, dose unknown.  3. Lasix 20 mg daily.  4. Lisinopril 40 mg daily.  5. Lopressor 50 mg daily.  6. Omeprazole 20 mg daily.  7. Plavix 75 mg daily.  8. Procardia 30 mg daily.  9. Simvastatin 20 mg daily.   DRUG ALLERGIES:  CODEINE.   FAMILY HISTORY:  The patient's mother died of myocardial infarction, age   64.  The patient's sister died of myocardial infarction, age 64.  The  patient has another sister and brother, both alive with coronary artery  disease.   SOCIAL HISTORY:  The patient is married with 4 adult children, and she  lives with her husband here in [place].  She is currently admitted to  occasional tobacco use, although she denied alcohol or illicit drug use  at the time of her initial presentation.   CURRENT MEDICATIONS:  Aspirin, Plavix, metoprolol, lisinopril,  simvastatin, Colace, Protonix, nifedipine, Lasix, Benadryl, Heparin,  nitroglycerin, and dopamine.   PHYSICAL EXAM:  The patient is an obese African-American female, who is  currently is moderately sedated, supine in the ventilator in the  intensive care unit.  There is intraaortic balloon pump in place via the  left common femoral approach.  There is a right femoral sheath in place.  Mean arterial pressure is approximately 75 mmHg.  She remains in sinus  rhythm.  Oxygen saturation was 100%.  HEENT:  Exam is grossly unremarkable.  NECK:  There is no obvious jugular venous distension.  CHEST:  Auscultation of the chest reveals symmetrical breath sounds with  fairly clear breath sounds and no wheezes or rhonchi noted.  CARDIOVASCULAR:  Includes regular rate and rhythm.  Soft systolic murmur  is suggested.  ABDOMEN:  Obese but soft and nondistended.  Bowel sounds are hypoactive.  EXTREMITIES:  Warm and adequately perfused, although slightly cool to  the touch in the periphery.  Distal pulses are not palpable in either  lower leg.  There is no obvious sign of venous insufficiency.  RECTAL and GU:  Both exams deferred.  NEUROLOGIC:  Examination is grossly nonfocal, although the patient  remains sedated on ventilator.   LABORATORY DATA:  Include hemoglobin 10.1 with hematocrit 30.9%.  These  were down from 13.8 and 41% , early this morning prior to catheterization.  White blood count is 6,600, platelet count  156,000,  down from 194,000 serum electrolytes include sodium 138, potassium 2.8,  chloride 105, bicarb 23, BUN 12, creatinine 1.4.  This is up from 1.0  prior to catheterization.  Glucose was most recently 324.  Cardiac  enzymes notable for a troponin-I of 0.01, with a total CK of 127 and a  CK-MB of 2.0, at 8:53 this morning.  This is the most recent set  available currently.   DIAGNOSTIC TESTS:  Cardiac catheterization performed by Dr. Eldridge Dace  today is reviewed.  This initially revealed 100% occlusion of the left  main coronary artery.  Following percutaneous coronary intervention,  there was significant residual left main disease and proximal coronary  artery disease involving both the left circumflex and the left anterior  descending coronary artery.  However, there was TIMI III flow in all  vascular territories.  The distal left anterior descending coronary  artery and the second diagonal branch were both fairly large vessels.  The second circumflex marginal branch was also a reasonably large  vessel.  There is high-grade proximal stenosis of small to medium-size  first diagonal branch and ramus intermediate branch.  The right coronary  artery with relatively small and diffusely diseased.  Left ventricular  function was not assessed at time of catheterization.   IMPRESSION:  That left main disease with three-vessel coronary artery  disease and underlying ischemic cardiomyopathy, status post acute left  main coronary occlusion, complicated by cardiac arrest in the cath lab  earlier today.  The patient underwent salvage percutaneous coronary  intervention of the left main coronary artery, and TIMI III flow was re-  established.  The patient has stabilized somewhat, although she remains  on ventilator with intra-aortic balloon pump in place.  The severity of  her underlying cardiac dysfunction prior to this event remains entirely  unclear, and this would be very helpful to know at  this point in time.   She does have potentially graftable distal coronary artery vessels in  the left coronary territory.  I do not feel it would be wise to take her  directly to the operating room at this juncture, but if she continues to  recover from this acute event earlier this morning, it is possible that  surgical revascularization would be a feasible treatment option.   PLAN:  We will continue to follow closely with her, as she is managed  here in the intensive care unit.  I would favor allowing her to wake up  and potentially be extubated if she recovers quickly, although I would  leave intra-aortic balloon pump in place indefinitely for the time  being.  It would be very helpful to obtain records from her previous  hospitalizations and treatment in [place].      Salvatore Decent. Cornelius Moras, M.D.  Electronically Signed     CHO/MEDQ  D:  08/14/2006  T:  08/14/2006  Job:  [id number]  cc:   Corky Crafts, MD

## 2010-09-14 NOTE — Cardiovascular Report (Signed)
NAME:  Karen [name], Karen [name] NO.:  [id number]   MEDICAL RECORD NO.:  [id number]          PATIENT TYPE:  INP   LOCATION:  2307                         FACILITY:  [facility]   PHYSICIAN:  Lyn Records, M.D.   DATE OF BIRTH:  05-15-1945   DATE OF PROCEDURE:  08/14/2006  DATE OF DISCHARGE:                            CARDIAC CATHETERIZATION   CARDIOLOGY PROCEDURE NOTE:   INDICATIONS FOR PROCEDURE:  Cardiogenic shock.   PROCEDURE PERFORMED:  Emergency insertion of intra-aortic balloon pump.   DESCRIPTION:  The patient was being managed by Dr. Everette Rank, doing  an acute hemodynamic crisis caused by spontaneous occlusion of the left  main coronary artery.  Dr. Eldridge Dace was busy trying to keep the left  main and it branches patent with PCI.  There was hemodynamic instability  present due to an acute ischemia that required insertion of an intra-  aortic balloon pump.  At his request, I assisted by placing an intra-  aortic balloon pump using the left femoral approach.  The 8-French  arterial sheath was placed during CPR.  Fluoroscopic guidance was used  for positioning of the balloon pump and a one-to-one pumping was begun  after successful placement.  The device was secured with sutures.   CONCLUSION:  Successful emergency placement of an intra-aortic balloon  pump via the left femoral approach without complications.      Lyn Records, M.D.     HWS/MEDQ  D:  08/15/2006  T:  08/15/2006  Job:  [id number]   cc:   Fleet Contras, M.D.  Corky Crafts, MD

## 2010-09-14 NOTE — Discharge Summary (Signed)
NAME:  Karen, [name] NO.:  [id number]   MEDICAL RECORD NO.:  [id number]          PATIENT TYPE:  INP   LOCATION:  2927                         FACILITY:  [facility]   PHYSICIAN:  Corky Crafts, MDDATE OF BIRTH:  [birth year]   DATE OF ADMISSION:  10/26/2007  DATE OF DISCHARGE:  10/28/2007                               DISCHARGE SUMMARY   DISCHARGE DIAGNOSES:  1. Coronary artery disease, status post percutaneous transluminal      coronary angioplasty to the first diagonal reducing that lesion      from 80% to a 20% stenosis.  2. History of ventricular fibrillation arrest in setting of ST-      elevation myocardial infarction.  3. Hypertension.  4. Hyperlipidemia.  5. Gastroesophageal reflux disease.  6. Status post total abdominal hysterectomy.  7. Former smoker, smoking cessation counseling  8. Long-term medication use.   Karen [name] is a 65 year old female with known coronary artery  disease with the following grafts:  Saphenous vein graft to the  diagonal, saphenous vein graft to circumflex, saphenous vein graft to  right coronary artery who awoke from sleep with sharp substernal chest  pain radiating into the left arm.  She had some shortness of breath and  nausea, but denied any diaphoresis, presyncope, or syncope.  She did  have some palpitations.  She took 5 or 6 sublingual nitroglycerin, which  did resolve the pain only for about an hour who presented to the  emergency room and remained pain free on IV nitroglycerin.  Her troponin  barely elevated at 0.09 with normal CK-MBs.  Plans were for her to  undergo cardiac catheterization on October 27, 2007.  She was found to have  a small, but patent LIMA to the LAD, saphenous vein graft to the  diagonal, saphenous vein graft to circumflex, saphenous vein graft to  right coronary artery were all occluded.  Her native vessel showed the  first diagonal to have an 80% stenosis in this area with angioplasty  down  to a 20% lesion.  Her RCA was diffusely diseased, the mid  circumflex was occluded.  The patient remained in the hospital overnight  and was ready for discharge to home the following day.   Lab studies during her hospital stay also included a sodium of 141,  potassium 3.7, BUN 9, creatinine 1.07, hemoglobin 12.5, hematocrit 36.8,  white count 6.0, platelets 157.   She was discharged to home in stable and improved condition on the  following medications:  1. Enteric-coated aspirin 325 mg a day.  2. Imdur 60 mg a day.  3. Lasix 20 mg a day.  4. Lisinopril 40 mg a day.  5. Lopressor 50 mg one-half tablet twice a day.  6. Sublingual nitroglycerin p.r.n. chest pain.  7. Omeprazole 20 mg twice a day.  8. Plavix 75 mg a day.  9. Procardia 30 mg a day.  10.Simvastatin daily.   The patient is to increase activity slowly.  No lifting greater than 10  pounds for 1 week.  No driving for 2 days.  Clean cath site gently with  soap and water, no scrubbing.   Follow up with Dr. Eldridge Dace on November 10, 2007, at 11:45 a.m.      Guy Franco, P.A.      Corky Crafts, MD  Electronically Signed    LB/MEDQ  D:  12/04/2007  T:  12/05/2007  Job:  [id number]

## 2010-09-14 NOTE — H&P (Signed)
NAME:  Karen [name], Karen [name] NO.:  [id number]   MEDICAL RECORD NO.:  [id number]          PATIENT TYPE:  EMS   LOCATION:  MAJO                         FACILITY:  [facility]   PHYSICIAN:  Tylene Fantasia, PA    DATE OF BIRTH:  1945/10/15   DATE OF ADMISSION:  08/13/2006  DATE OF DISCHARGE:                              HISTORY & PHYSICAL   PRIMARY CARE PHYSICIAN:  Research officer, [employer] on [place].   CHIEF COMPLAINT:  Chest pain.   HISTORY OF PRESENT ILLNESS:  Karen [name] is a 65 year old African  American female with a known history of coronary artery disease with a  prior MI in 03/2006 with a questionable stent.  She also had an AICD  implanted in 03/2006.  She also admitted to a previous stent  implantation in 2006.  At this point, the vessels that was stented are  unknown as the patient just recently relocated to [place] from  [place], [place], and her records have not yet been  obtained.   On last evening she complained of left arm numbness with tingling with a  sudden onset of nonexertional substernal chest pain awakening her from  her sleep about 4:30 this morning.  She took sublingual nitroglycerin x2  with relief.  She admitted to shortness of breath, diaphoresis, nausea,  and dizziness.  There were no other associated symptoms.  The pain is  similar in quality and character to previous chest pain episodes prior  to the MI she experienced in December 2007.  She was started on IV  nitroglycerin and IV heparin upon arrival to the [facility].  A  12-lead EKG revealed normal sinus rhythm with first degree AV block with  a ventricular rate of 63 beats per minute.  There were no acute ischemic  changes.  Point of care enzymes are negative times one.  The patient  currently admits to being chest pain free.   PAST MEDICAL HISTORY:  1. Coronary artery disease status post stent implantation times one,      possibly x2.  2. Hypertension.  3.  Dyslipidemia.  4. GERD.  5. Status post AICD in 03/2006.  6. Status post previous MI.   ALLERGIES:  CODEINE.   MEDICINES:  1. Aspirin 325 mg daily.  2. Imdur twice daily, unknown dosage.  3. Lasix 20 mg daily.  4. Lisinopril 40 mg daily.  5. Lopressor 50 mg daily.  6. Omeprazole 20 mg daily.  7. Plavix 75 mg daily.  8. Procardia 30 mg daily.  9. Simvastatin 20 mg daily.   PAST SURGICAL HISTORY:  1. Status post total abdominal hysterectomy.  2. Status post back surgery.  3. Status post cholecystectomy.   FAMILY HISTORY:  1. Mother deceased age 28, MI.  2. Father deceased leukemia  3. Sister deceased age 31, MI.  53. Sister living with coronary artery disease.  5. Brother living with coronary artery disease.   SOCIAL HISTORY:  Married with four adult children.  Lives with her  husband.  She admits to occasional tobacco use indicating that she  smokes approximately three cigarettes per  week.  She denies alcohol, or  illicit drug use.   REVIEW OF SYSTEMS:  All other systems are negative other than what is  stated in the HPI.   PHYSICAL EXAMINATION:  GENERAL:  A 65 year old female, pleasant and  cooperative, NAD.  VITALS:  Temperature 97.2, blood pressure 120/57, pulse 64, respirations  18, O2 saturations 99% over 2 liters.  HEENT: Benign.  NECK:  Supple without JVD or bilateral carotid bruits.  Carotid  upstrokes 2+.  PULMONARY:  Breath sounds are equal and clear to auscultation  bilaterally.  No wheezes, rhonchi, crackles or rales.  CV:  Regular rate and rhythm.  Normal S1-S2 without murmurs, gallops,  clicks or rubs.  ABDOMEN:  Benign.  EXTREMITIES:  No peripheral edema.  DP and femoral pulses 2+/2,  bilaterally.  SKIN:  Warm and dry without rashes or lesions.  NEUROLOGICAL:  No focal motor or sensory deficits.  :  Normal mood and affect.  BACK:  No kyphosis or scoliosis.   LABORATORY DATA:  White blood count 4.1, hemoglobin 12.5, hematocrit  37.2, platelets  184,000.  Sodium 139, potassium 3.7, chloride 108, CO2  25.7, BUN 12, creatinine 1.1, glucose 119, Myoglobin 87.1, CK-MB 1.8,  troponin I less than 0.05.  Chest x-ray:  No acute disease.  EKG as  stated in the HPI.   ASSESSMENT:  1. Chest pain consistent with unstable angina.  2. History of coronary artery disease with a prior MI in 03/2006 with      questionable stent in 03/2006.  Status post AICD in 03/2006.  Also      PCI with stent implantation in 2006.  3. Hypertension, controlled.  4. Dyslipidemia.  5. Status post AICD 03/2006.  6. Otherwise as stated in the past medical history.   PLAN:  1. Admits to cardiac telemetry unit under the service of Dr. Everette Rank with a diagnosis of chest pain consistent with unstable      angina.  2. Rule out MI with cardiac panel q.8 h x3.  3. Start IV nitroglycerin and IV heparin and then.  The latter drip      only with no bolus.  Titrate IV nitroglycerin 5-10 mcg per minute      keeping systolic blood pressure greater than 100.  4. Daily B-mets, CBC, and EKG.  5. Fasting lipid panel in a.m.  6. Initiate cardiology p.r.n. orders.  7. Cardiac catheterization on 08/14/2006 at 8:30 a.m. to rule out re-      in-stent stenosis and possible progression of coronary artery      disease.  The cardiac catheterization procedure, risks, and      potential complications were explained to the patient in detail      including MI, CVA, death, renal insufficiency, IV contrast dye      allergy, and vascular complications including bleeding, hematoma,      and pseudoaneurysm.  The patient wishes to proceed.  8. The patient was seen, interviewed, and examined by Dr. Eldridge Dace who      participate in the medical decision making and plan of care.  9. Previous cardiac records to be obtained from [facility].      Tylene Fantasia, [place]    RDM/MEDQ  D:  08/13/2006  T:  08/13/2006  Job:  [id number]   cc:   [employer]

## 2010-09-14 NOTE — Op Note (Signed)
NAME:  Karen [name], Karen [name] NO.:  [id number]   MEDICAL RECORD NO.:  [id number]          PATIENT TYPE:  INP   LOCATION:  2307                         FACILITY:  [facility]   PHYSICIAN:  Salvatore Decent. Cornelius Moras, M.D. DATE OF BIRTH:  1945/06/22   DATE OF PROCEDURE:  08/14/2006  DATE OF DISCHARGE:                               OPERATIVE REPORT   PREOPERATIVE DIAGNOSIS:  Critical left main disease, status post acute  myocardial infarction with ventricular fibrillation arrest.   POSTOPERATIVE DIAGNOSIS:  Critical left main disease, status post acute  myocardial infarction with ventricular fibrillation arrest.   PROCEDURE:  Emergency median sternotomy for coronary artery bypass  grafting x4 (left internal mammary artery to distal left anterior  descending coronary artery, saphenous vein graft to first diagonal  branch, saphenous vein graft to second circumflex marginal branch,  saphenous vein graft to distal right coronary artery, endoscopic  saphenous vein harvest from right thigh and right lower leg).   SURGEON:  Dr. Purcell Nails.   ASSISTANT:  Rowe Clack, P.A.-C.   ANESTHESIA:  General.   BRIEF CLINICAL NOTE:  The patient is a 65 year old African American  female with known history of coronary artery disease and numerous  previous myocardial infarctions.  She apparently has undergone  percutaneous coronary intervention and stenting of the right coronary  artery in the past.  In December 2007, she had sustained a myocardial  infarction complicated by V-fib arrest.  A defibrillator was placed at  that time.  Since then, the patient has done poorly with recurrent  symptoms of severe angina over the last several weeks.  Her  defibrillator has fired a total of four times since it was placed in  December.  She was admitted to the hospital on August 13, 2006, with  acute coronary syndrome and unstable angina.  Initial cardiac enzymes  were negative.  She was taken to the cath  lab in the morning of August 14, 2006, where she probably suffered a V-fib arrest after she was  placed on the cath lab table.  Cardiac catheterization revealed acute  occlusion of the left main coronary artery.  The patient underwent  angioplasty of the left main coronary artery, reestablishing TIMI-3 flow  in the left coronary circulation.  The patient was left with critical  left main disease and three-vessel coronary artery disease.  Intra-  aortic balloon pump was placed.  The patient stabilized remarkably and  was transported to the intensive care unit.  The patient subsequently  woke up and was following commands and complaining of some ongoing chest  pain.  Subsequent electrocardiograms confirmed persistent ST-segment  elevation in lateral leads.  Bedside echocardiogram revealed only mild  left ventricular dysfunction.  Emergent surgical revascularization is  felt the most prudent course of treatment under the circumstances.   OPERATIVE CONSENT:  The patient's son and daughter and husband have been  counseled regarding the need for coronary revascularization.  Alternative treatment strategies have been reviewed.  They understand  the emergent nature of the circumstances as well as the potential for a  variety of complications.  They specifically understand and accept all  potential risks including, but not limited to, risk of death, stroke,  myocardial infarction, cardiac failure requiring mechanical circulatory  support, respiratory failure, congestive heart failure, renal failure,  bleeding requiring blood transfusion, arrhythmia, infection, recurrent  coronary artery disease.  They desire for [place] to proceed to surgery  directly as planned.   OPERATIVE FINDINGS:  1. Mild-to-moderate left ventricular dysfunction with lateral wall      akinesis and inferior wall hypokinesis.  2. Mild mitral regurgitation.  3. Diffusely diseased distal right coronary artery.  4. Good-quality  left internal mammary artery and saphenous vein      conduit for grafting.  5. Relatively small-caliber coronary arteries.   OPERATIVE NOTE IN DETAIL:  The patient was brought directly from the  intensive care unit to the operating room on the afternoon of August 14, 2006, and placed in the supine position on the operating table.  The  patient remains intubated on mechanical ventilatory support with intra-  aortic balloon pump in place.  Adequate general anesthesia is verified  under the care and direction Dr. Bedelia Person.  The patient's chest,  abdomen, both groins, and both lower extremities are prepared and draped  in sterile manner.  Baseline transesophageal echocardiogram is performed  by Dr. Gypsy Balsam.  This demonstrates only mild left ventricular dysfunction  with a lateral wall hypokinesis or akinesis and inferior wall  hypokinesis.  There is mild mitral regurgitation.  Ejection fraction is  estimated greater than 45%.   A median sternotomy incision is performed and the left internal mammary  artery is dissected from the chest wall and prepared for bypass  grafting.  The left internal mammary artery is a good-quality conduit  for grafting.  Simultaneously, saphenous vein is obtained from the  patient's right thigh and the upper portion of the right lower leg using  endoscopic vein harvest technique.  The saphenous vein is good-quality  conduit.  After the saphenous vein is removed, the small incisions in  the right lower extremity are closed in multiple layers with running  absorbable suture.  The patient is heparinized systemically and left  internal mammary artery transected distally.  It is noted to have  excellent flow.   The pericardium is opened.  The ascending aorta is moderately diseased  with atherosclerosis.  The ascending aorta and the right atrium are  cannulated for cardiopulmonary bypass.  A retrograde cardioplegic catheter is placed through the right atrium into the  coronary sinus.  Cardiopulmonary bypass is begun and the surface of the heart is  inspected.  Distal sites are selected for coronary bypass grafting.  A  temperature probe is placed in the left ventricular septum.  A  cardioplegic catheter is placed in the ascending aorta.   The patient is allowed to cool passively to 32 degrees systemic  temperature.  The aortic crossclamp is applied and cold blood  cardioplegia is administered initially in antegrade fashion through the  aortic root.  Iced saline slush is applied for topical hypothermia.  The  initial cardioplegic arrest, myocardial cooling is felt to be excellent.  Supplemental cardioplegia is administered retrograde through the  coronary sinus catheter.  Repeat doses of cardioplegia are administered  intermittently throughout the crossclamp portion of the operation  through the aortic root, down the subsequently placed vein grafts, and  retrograde through the coronary sinus catheter to maintain left  ventricular septal temperature below 15 degrees centigrade.   The following distal coronary anastomoses  are performed:  1. The second circumflex marginal branch is grafted with a saphenous      vein graft in end-to-side fashion.  This vessel measures 1.4 mm in      diameter and is a fair to good quality target vessel for grafting.      The ramus intermediate branch and the first circumflex marginal      branch are too small for grafting.  2. The first diagonal branch off the left anterior descending coronary      artery is grafted with a saphenous vein graft in end-to-side      fashion.  This vessel measures 1.5 mm in diameter and is a good-      quality target vessel for grafting.  3. The distal right coronary artery is grafted at the level of its      bifurcation in end-to-side fashion with a saphenous vein graft.      This vessel measures 1.2 mm in diameter, is chronically diseased      and a poor quality target vessel for  grafting.  4. The distal left anterior descending coronary artery is grafted with      the left internal mammary artery in end-to-side fashion.  This      vessel measures 1.5 mm in diameter and is a fair to good quality      target vessel for grafting.  There is some diffuse disease      proximally and distally but a 1.5 probe will pass easily in both      directions.   All three proximal and saphenous vein anastomoses are performed directly  to the ascending aorta prior to removal of the aortic crossclamp.  The  left ventricular septal temperature rises rapidly with reperfusion of  the left internal mammary artery.  One final dose of warm retrograde hot  shot cardioplegia is administered.  The aortic crossclamp is removed  after a total crossclamp time of 79 minutes.   The heart is defibrillated.  Normal sinus rhythm resumes.  All proximal  and distal coronary anastomoses are inspected for hemostasis and appropriate graft orientation.  Epicardial pacing wires are fixed right  ventricular outflow tract and to the right atrial appendage.  The  patient is rewarmed to 37 degrees centigrade temperature.  The patient  is weaned from cardiopulmonary bypass after the institution of the intra-  aortic balloon counter pulsation.  The patient is weaned from bypass on  low-dose dopamine at 3 mcg/kg/minute.  Total cardiopulmonary bypass time  for the operation is 103 minutes.  The rhythm at separation from bypass  is normal sinus rhythm.  Left ventricular function appears initially  somewhat worse after separation from bypass but it gradually improves  during the post bypass portion of the operation.  There remains  hypokinesis of the inferior wall and akinesis of the lateral wall.  The  anterior wall and septum seem to be moving well.  There is mild mitral  regurgitation.   The venous and arterial cannulae are removed uneventfully.  Protamine is  administered to reverse the anticoagulation.   There is moderate  coagulopathy.  The patient is transfused two packs of platelets and 2  units fresh frozen plasma for coagulopathy with history of longstanding  Plavix therapy prior to presentation.   The mediastinum and left chest are drained with three chest tubes exited  through separate stab incisions inferiorly.  The median sternotomy is  closed with double-strength sternal wire.  The soft tissues  anterior to  the sternum are closed in multiple layers and the skin is closed with a  running subcuticular skin closure.  The patient tolerated the procedure  reasonably well and is transported to the surgical intensive care unit  in stable but serious condition.  There are no intraoperative  complications.  All sponge, instrument and needle counts are verified  correct at completion of the operation.  The patient was transfused a  total of 3 units packed red blood cells during surgery.      Salvatore Decent. Cornelius Moras, M.D.  Electronically Signed     CHO/MEDQ  D:  08/14/2006  T:  08/15/2006  Job:  [id number]

## 2010-09-14 NOTE — Discharge Summary (Signed)
NAME:  Karen [name], Karen [name] NO.:  [id number]   MEDICAL RECORD NO.:  [id number]          PATIENT TYPE:  INP   LOCATION:  2011                         FACILITY:  [facility]   PHYSICIAN:  Salvatore Decent. Cornelius Moras, M.D. DATE OF BIRTH:  08/27/1945   DATE OF ADMISSION:  08/13/2006  DATE OF DISCHARGE:  08/23/2006                               DISCHARGE SUMMARY   HISTORY OF PRESENT ILLNESS:  The patient is a 65 year old black female  with a known history of coronary artery disease status post myocardial  infarction in December 2007 with a questionable stent.  She also had an  AICD implanted in December 2007.  She also admitted to a previous stent  implant in 2006.  At the time of admission the vessels that were stented  were unknown as the patient just recently relocated to [facility] from  [place], [place], and records were not available.  On the  evening prior to admission she complained of left arm numbness with  tingling, with a sudden onset of nonexertional substernal chest pain  that awoke her from sleep.  She took two sublingual nitroglycerin with  relief.  She did additionally have shortness of breath, diaphoresis,  nausea and dizziness.  There were no other associated symptoms.  The  pain was felt to be similar in quality and character to a previous chest  pain episode prior to her myocardial infarction that she had in December  2007.  She presented to the [facility] and was placed on  intravenous nitroglycerin and heparin.  A 12-lead EKG revealed normal  sinus rhythm with first-degree AV block and a ventricular rate of 63  beats per minute.  There were no acute ischemic changes.  Initial  cardiac enzymes were negative.  The patient became chest pain-free but  was felt to require admission for further evaluation and treatment.   PAST MEDICAL HISTORY:  1. Coronary artery disease status post stent implant x1, possibly x2.  2. Hypertension.  3.  Hyperlipidemia.  4. Gastroesophageal reflux.  5. 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  6. Status post previous myocardial infarction.   ALLERGIES:  CODEINE.   MEDICATIONS PRIOR TO ADMISSION:  1. Aspirin 325 mg daily.  2. Imdur twice daily, unknown dosage.  3. Lasix 20 mg daily.  4. Lisinopril 40 mg daily.  5. Lopressor 50 mg daily.  6. Omeprazole 20 mg daily.  7. Plavix 75 mg daily.  8. Procardia 30 mg daily.  9. Simvastatin 20 mg daily.   PAST SURGICAL HISTORY:  1. Status post total abdominal hysterectomy.  2. Status post back surgery.  3. Status post cholecystectomy.   Family history, social history, review of systems and physical exam:  Please see the history and physical done at the time of admission.   HOSPITAL COURSE:  The patient was admitted to cardiac telemetry under  Dr. Hoyle Barr service.  The initial plan was to rule out myocardial  infarction, start intravenous nitroglycerin and heparin, check labs, and  plan for cardiac catheterization.   Hospital course:  The patient had the above done and cardiac  catheterization was  scheduled and on August 14, 2006, she was taken to  the labs, where findings included a left main occlusion and a small but  patent right coronary artery when the patient arrived she was having  chest pain and telemetry showed inferior ST elevations, and she became  diaphoretic.  She then became unresponsive and her rhythm became PEA.  She lost her blood pressure as well.  A prompt coronary angiogram  revealed the left main occlusion.  A balloon angioplasty was emergently  done.  Flow was improved to the left main but thrombus was found in the  proximal LAD.  The the patient had a balloon pump placed emergently by  Dr. Katrinka Blazing.  ACLS protocols were used.  She was anticoagulated with  heparin and Integrilin.  She was started on dopamine and CPR was  performed intermittently.  The patient stabilized and was placed back in  the CCU for aggressive  medical management.  A prompt critical care  medicine consultation was obtained by Dr. Delton Coombes.  The patient then was  also seen in emergent surgical consultation by Dr. Cornelius Moras.  The patient  initially was not felt to be a surgical candidate; however, she did  awake and followed commands.  A stat bedside echocardiogram was  obtained, which actually revealed fairly well-preserved left ventricular  function although she was on a balloon pump and low-dose dopamine.  In  further discussion with the family, she was then felt to have a better  prognosis with surgery, although high-risk.  The patient was then taken  to the operating room on August 14, 2006, where the following procedure  was performed:  Procedure:  Coronary artery bypass grafting x4:   1. Left internal mammary artery to the LAD.  2. Saphenous vein graft to the obtuse marginal.  3. Saphenous vein graft to the right coronary artery.  4. Saphenous vein graft to diagonal.   The patient tolerated the procedure well and was taken to the surgical  intensive care unit in critical condition.   Postoperative hospital course:  The patient initially had stable  hemodynamics on the balloon pump with aggressive volume replacement and  inotropic support.  She was assisted in management postoperatively by  the critical care service for her ventilator.  She was able to be  extubated without significant difficulties on postoperative day #1.  The  pressors were weaned and the intra-aortic balloon pump was discontinued  in the evening of postoperative day #1.  The patient did require a few  days further in the intensive care unit for monitoring purposes and  aggressive pulmonary management.  She was able to have all routine  lines, monitors and drainage devices discontinued in a fairly routine  manner.  She showed a good and gradual improvement.  She did have a postoperative acute blood loss anemia, but this has stabilized.  Her  volume excess is  slowly improving with diuretics.  Her blood pressure  has been somewhat marginal in the postoperative period, and she has not  been able to restart her ACE inhibitor.  She was transferred to the 2000  telemetry unit on postoperative day #5.  An echocardiogram was obtained  on August 18, 2006, which revealed an ejection fraction of 55% with mild  hypokinesis in the anteroseptal wall.  She had some mild low-grade  fevers postoperatively with leukocytosis and did rule in for a urinary  tract infection and has been started on Cipro.  The patient's incisions  are all healing well  without signs of infection.  She is slowly  improving regarding her activities, commensurate to level of  postoperative convalescence using standard cardiac rehabilitation phase  1 modalities.  Her overall status is felt to be tentatively stable for  discharge on or about the morning of August 23, 2006.  The patient did  have an episode of chest tightness on August 21, 2006, and it was Dr.  Michaelle Copas opinion that the patient should be started on a long-acting  nitrate for possible recurrent ischemic pain due to the possibility of  endothelial dysfunction.  He therefore started her on Imdur 60 mg daily.   Medications at the time of dictation are as follows:  1. Aspirin 325 mg daily.  2. Lopressor 50 mg three times daily.  3. Zocor 20 mg q.h.s.  4. Omeprazole 20 mg daily.  5. Lasix 40 mg daily for 3 days.  6. Potassium chloride 20 mEq daily for 3 days.  7. Cipro 500 mg twice daily for 2 additional days.  8. Imdur 60 mg daily for pain.  9. Oxycodone 5 mg one to two every 4-6 hours as needed.   INSTRUCTIONS:  The patient received written instructions regarding  medications, activity, diet, wound care and follow-up.  Follow-up will  include Dr. Eldridge Dace on Sep 05, 2006, and will obtain chest x-ray at that  time condition.  In addition, the patient should see Dr. Cornelius Moras in  approximately 3 weeks.  The office will call with  this appointment.   FINAL DIAGNOSES:  1. Severe coronary artery disease as delineated above, now status post      emergency surgical revascularization.  2. Postoperative volume overload.  3. Postoperative ventilatory-dependent respiratory failure, resolved.  4. Postoperative urinary tract infection.  5. Acute ST-segment elevation myocardial infarction on admission.  6. Hypertension.  7. Hyperlipidemia.  8. Gastroesophageal reflux.  9. History of previous automatic implantable cardioverter      defibrillator placement.  10.History of total abdominal hysterectomy.  11.History of back surgery.  12.History of cholecystectomy.  13.History of allergy to CODEINE.  14.Strong family history coronary disease.      Rowe Clack, P.A.-C.      Salvatore Decent. Cornelius Moras, M.D.  Electronically Signed    WEG/MEDQ  D:  08/22/2006  T:  08/22/2006  Job:  [id number]   cc:   Leslye Peer, MD Corky Crafts, MD

## 2010-09-14 NOTE — Discharge Summary (Signed)
NAME:  Karen [name], Karen [name] NO.:  [id number]   MEDICAL RECORD NO.:  [id number]          PATIENT TYPE:  INP   LOCATION:  2011                         FACILITY:  [facility]   PHYSICIAN:  Theda Belfast, PA DATE OF BIRTH:  1946-03-18   DATE OF ADMISSION:  08/13/2006  DATE OF DISCHARGE:                               DISCHARGE SUMMARY   PRIMARY DIAGNOSIS:  Critical left main disease status post acute  myocardial infarction with ventricular fibrillation and rest.   IN-HOSPITAL DIAGNOSES:  1. Cardiogenic shock.  2. Ventilator dependent respiratory failure.  3. Acute blood loss anemia postoperatively.  4. Volume overload postoperatively.   SECONDARY DIAGNOSES:  1. Coronary artery disease status post stent implantation x1.  2. Hypertension.  3. Hyperlipidemia.  4. Gastroesophageal reflux disease.  5. Status post automatic implantable cardioverter defibrillator.   DICTATION ENDED AT THIS POINT      Theda Belfast, [place]     KMD/MEDQ  D:  08/21/2006  T:  08/21/2006  Job:  [id number]

## 2010-09-14 NOTE — Cardiovascular Report (Signed)
NAME:  Karen [name], Karen [name] NO.:  [id number]   MEDICAL RECORD NO.:  [id number]          PATIENT TYPE:  INP   LOCATION:  2011                         FACILITY:  [facility]   PHYSICIAN:  Corky Crafts, MDDATE OF BIRTH:  1945/11/16   DATE OF PROCEDURE:  08/14/2006  DATE OF DISCHARGE:                            CARDIAC CATHETERIZATION   PROCEDURES PERFORMED:  Left heart catheterization, coronary angiogram,  PCI of the left main coronary artery; and PCI of the LAD, intra-aortic  balloon pump placement   OPERATOR:  Corky Crafts, MD   INDICATIONS:  Acute ST-segment elevation MI.   PROCEDURE NARRATIVE:  The patient had been admitted to the hospital on  the previous day.  She ruled out for MI; but given her symptoms of  unstable angina, a cardiac cath was planned for the following day.  The  patient was transported to the cath lab; she was having chest pain.  When she was placed on the monitor in the cath lab, she was found to  have inferior ST elevation.  The patient was quite diaphoretic and  actively having chest pain.  Her right groin was infiltrated with 1%  lidocaine.  A 6-French arterial sheath was placed into the right femoral  artery, quickly, using the modified Seldinger technique.  During the  arterial access the patient did have pulsatile blood flow through the  needle; she was talking as well.  Once was the left coronary artery  catheter was advanced just into the aorta, no pressure tracing was  noted.  The patient was unresponsive.  CPR was started the JL-4.0  catheter was then advanced to the left coronary cusp.  A nonselective  cusp shot was taken which revealed an occluded left main.  The JL-4.0  catheter was quickly removed.  CPR was resumed a CLS 3.5 guiding  catheter was advanced to the ostium of the left main.  The patient was  bolused with heparin and subsequently Integrilin.  A Prowater wire was  placed into the left coronary artery and flow  was quickly restored.  The  wire was guided down the LAD.  Initially the patient's heart rhythm and  blood pressure did return.  A 2.5 x 9 Maverick balloon was then placed  across the lesion in the left main and inflated to 6 atmospheres for 10  seconds.  The flow was improved in the left main.  There was thrombus  noted in the proximal LAD.  The same balloon was then advanced to the  proximal LAD and inflated to 6 atmospheres for 10 seconds; and flow was  restored throughout the left coronary system.   The patient's ST-segment elevations resolved; however, her blood  pressure was still quite low.  She required dopamine to maintain her  blood pressure.  An intra-aortic balloon pump was placed into her left  femoral artery and into the aorta by Dr. Katrinka Blazing.  The patient did lose  her rhythm, again, at one point.  A repeat angiogram showed the  circumflex was occluded.  A BMW wire was placed into the circumflex down  the obtuse marginal  which was enough to restore flow.  CPR was  continued, the patients did regain a pulse under pressure.  Epinephrine  and atropine were given along with sodium bicarb.  The patient was also  intubated during the case.  Her dopamine was used to maintain her blood  pressure.  By the end the case she had TIMI 3 flow in her left system;  and was maintaining her blood pressure on balloon pump and intravenous  dopamine.  She was transferred to the CCU, CVTS was called for  evaluation of possible emergent bypass surgery.   FINDINGS:  The left main was occluded.  The RCA was engaged with a JR-  4.0 catheter; and it was found to be small but patent.   HEMODYNAMICS:  LV pressure 115/20 with an LVEDP of 26.  Aortic pressure  of 112/73 with a mean aortic pressure of 94.  These pressures were  obtained after the intervention and balloon pump was placed.   IMPRESSIONS:  1. Acute myocardial infarction caused by an occluded left main.  2. Percutaneous transluminal  coronary angioplasty to the left main and      LAD restoring flow.  3. At the end of the procedure the patient was on dopamine and intra-      aortic balloon pump, and intubated; but she had TIMI 3 flow to her      entire myocardium.   RECOMMENDATIONS:  We will watch the patient in the CCU.  We will  continue to try to wean the dopamine, continue the IABP and  anticoagulation.  We will get a surgical opinion from CVTS for more  definitive revascularization.  Her condition certainly is critical.  This was discussed with the patient's family as well.      Corky Crafts, MD  Electronically Signed     JSV/MEDQ  D:  08/20/2006  T:  08/20/2006  Job:  [id number]

## 2010-10-02 ENCOUNTER — Encounter: Payer: Self-pay | Admitting: Internal Medicine

## 2010-11-02 ENCOUNTER — Ambulatory Visit (INDEPENDENT_AMBULATORY_CARE_PROVIDER_SITE_OTHER): Payer: Medicaid Other | Admitting: Internal Medicine

## 2010-11-02 ENCOUNTER — Encounter: Payer: Self-pay | Admitting: Internal Medicine

## 2010-11-02 DIAGNOSIS — I2510 Atherosclerotic heart disease of native coronary artery without angina pectoris: Secondary | ICD-10-CM

## 2010-11-02 DIAGNOSIS — Z95810 Presence of automatic (implantable) cardiac defibrillator: Secondary | ICD-10-CM | POA: Insufficient documentation

## 2010-11-02 DIAGNOSIS — I428 Other cardiomyopathies: Secondary | ICD-10-CM

## 2010-11-02 LAB — ICD DEVICE OBSERVATION
DEV-0020ICD: NEGATIVE
DEVICE MODEL ICD: 130884
PACEART VT: 0
TOT-0001: 2
TOT-0002: 1
VENTRICULAR PACING ICD: 0 pct

## 2010-11-02 NOTE — Assessment & Plan Note (Signed)
The patient's device was interrogated.  The information was reviewed. No changes were made in the programming.    

## 2010-11-02 NOTE — Assessment & Plan Note (Signed)
Continues to struggle with pain and shortness of breath

## 2010-11-02 NOTE — Progress Notes (Signed)
  HPI  Karen [name] is a 64 y.o. female in followup for ICD implantation for primary prevention in the setting of ischemic heart disease. She underwent bypass surgery emergently on presentation with MI and associated VF.    She continues to have complaints of dyspnea on exertion limited to vacuuming, ADLs, and certainly a flight of stairs.some days are much worse than others.  She continues to have chest pains This is better compared to 2008 prior to surgery  LV function 2008 normal by cath and echo     Past Medical History  Diagnosis Date  . Postsurgical aortocoronary bypass status     hx of it.   Marland Kitchen CAD (coronary artery disease)   . Acute myocardial infarction, unspecified site, episode of care unspecified   . HTN (hypertension)   . Other and unspecified hyperlipidemia   . Other diseases of lung, not elsewhere classified   . Headache   . Allergic rhinitis, cause unspecified   . GERD (gastroesophageal reflux disease)     Past Surgical History  Procedure Date  . Cardiac defibrillator placement 03/2006    Guidant. remote-yes.   . Coronary artery bypass graft     x4  . Stent implant     x1. possibly x2.     Current Outpatient Prescriptions  Medication Sig Dispense Refill  . amLODipine (NORVASC) 5 MG tablet Take 5 mg by mouth daily.        Marland Kitchen aspirin 325 MG tablet Take 325 mg by mouth daily.        . clopidogrel (PLAVIX) 75 MG tablet Take 75 mg by mouth daily.        Marland Kitchen esomeprazole (NEXIUM) 40 MG capsule Take 40 mg by mouth daily.        . furosemide (LASIX) 20 MG tablet Take 20 mg by mouth daily.        . isosorbide mononitrate (IMDUR) 120 MG 24 hr tablet Take 120 mg by mouth daily.        Marland Kitchen lisinopril (PRINIVIL,ZESTRIL) 10 MG tablet Take 10 mg by mouth daily.        . metoprolol tartrate (LOPRESSOR) 25 MG tablet Take 25 mg by mouth 2 (two) times daily.        . nitroGLYCERIN (NITROSTAT) 0.4 MG SL tablet Place 0.4 mg under the tongue every 5 (five) minutes as needed.         . ranolazine (RANEXA) 500 MG 12 hr tablet Take 500 mg by mouth 2 (two) times daily.        . rosuvastatin (CRESTOR) 10 MG tablet Take 10 mg by mouth daily.        Marland Kitchen DISCONTD: simvastatin (ZOCOR) 40 MG tablet Take 60 mg by mouth daily.          Allergies  Allergen Reactions  . Codeine     Review of Systems negative except from HPI and PMH  Physical Exam Well developed and well nourished in no acute distress HENT normal E scleral and icterus clear Neck Supple JVP flat;  Clear to ausculation Device pocket well healed Regular rate and rhythm, no murmurs gallops or rub Soft with active bowel sounds No clubbing cyanosis and edema Alert and oriented, grossly normal motor and sensory function Skin Warm and Dry     Assessment and  Plan

## 2010-11-02 NOTE — Patient Instructions (Signed)
Your physician wants you to follow-up in: 1 year  You will receive a reminder letter in the mail two months in advance. If you don't receive a letter, please call our office to schedule the follow-up appointment.  Your physician recommends that you continue on your current medications as directed. Please refer to the Current Medication list given to you today.  

## 2011-01-24 LAB — DIFFERENTIAL
Basophils Relative: 1
Lymphs Abs: 2.3
Monocytes Relative: 6
Neutro Abs: 2.2
Neutrophils Relative %: 44

## 2011-01-24 LAB — POCT I-STAT, CHEM 8
Chloride: 105
Glucose, Bld: 128 — ABNORMAL HIGH
HCT: 40
Potassium: 3.6
Sodium: 140

## 2011-01-24 LAB — BASIC METABOLIC PANEL
BUN: 8
BUN: 9
CO2: 27
Calcium: 9.1
Chloride: 111
Chloride: 106
Creatinine, Ser: 1.07
GFR calc Af Amer: >60
GFR calc non Af Amer: 48 — ABNORMAL LOW
Glucose, Bld: 120 — ABNORMAL HIGH
Potassium: 3.7

## 2011-01-24 LAB — CBC
HCT: 36.8
Hemoglobin: 12.5
MCHC: 33.8
MCV: 91.3
Platelets: 157
RBC: 4.03
RBC: 4.27
RBC: 4.04
WBC: 5
WBC: 5.2
WBC: 6

## 2011-01-24 LAB — LIPID PANEL
HDL: 44
LDL Cholesterol: 97
Total CHOL/HDL Ratio: 3.5
Triglycerides: 68
VLDL: 14

## 2011-01-24 LAB — POCT CARDIAC MARKERS
CKMB, poc: 1.5
CKMB, poc: 1.8
Myoglobin, poc: 62.2
Troponin i, poc: 0.06 — ABNORMAL HIGH

## 2011-01-24 LAB — HEPARIN LEVEL (UNFRACTIONATED): Heparin Unfractionated: 1.45 — ABNORMAL HIGH

## 2011-01-24 LAB — CARDIAC PANEL(CRET KIN+CKTOT+MB+TROPI)
CK, MB: 1.9
CK, MB: 2.5
Relative Index: INVALID
Total CK: 87
Troponin I: 0.07 — ABNORMAL HIGH

## 2011-01-24 LAB — PROTIME-INR: INR: 1.1

## 2011-01-31 ENCOUNTER — Ambulatory Visit (INDEPENDENT_AMBULATORY_CARE_PROVIDER_SITE_OTHER): Payer: Medicaid Other | Admitting: *Deleted

## 2011-01-31 ENCOUNTER — Encounter: Payer: Self-pay | Admitting: Internal Medicine

## 2011-01-31 DIAGNOSIS — I4901 Ventricular fibrillation: Secondary | ICD-10-CM

## 2011-02-11 NOTE — Progress Notes (Signed)
icd remote check  

## 2011-02-12 LAB — REMOTE ICD DEVICE
BATTERY VOLTAGE: 2.61 V
BRDY-0002RV: 40 {beats}/min
HV IMPEDENCE: 59 Ohm
RV LEAD AMPLITUDE: 11.8 mv
RV LEAD IMPEDENCE ICD: 524 Ohm

## 2011-05-09 ENCOUNTER — Ambulatory Visit (INDEPENDENT_AMBULATORY_CARE_PROVIDER_SITE_OTHER): Payer: Medicaid Other | Admitting: *Deleted

## 2011-05-09 ENCOUNTER — Encounter: Payer: Self-pay | Admitting: Internal Medicine

## 2011-05-09 ENCOUNTER — Other Ambulatory Visit: Payer: Self-pay | Admitting: Internal Medicine

## 2011-05-09 DIAGNOSIS — I4901 Ventricular fibrillation: Secondary | ICD-10-CM

## 2011-05-12 LAB — REMOTE ICD DEVICE
BATTERY VOLTAGE: 2.6 V
BRDY-0002RV: 40 {beats}/min
CHARGE TIME: 17.7 s
HV IMPEDENCE: 59 Ohm
RV LEAD IMPEDENCE ICD: 494 Ohm
VENTRICULAR PACING ICD: 0 pct

## 2011-05-15 NOTE — Progress Notes (Signed)
ICD remote 

## 2011-08-15 ENCOUNTER — Encounter: Payer: Self-pay | Admitting: Internal Medicine

## 2011-08-15 ENCOUNTER — Ambulatory Visit (INDEPENDENT_AMBULATORY_CARE_PROVIDER_SITE_OTHER): Payer: Medicaid Other | Admitting: *Deleted

## 2011-08-15 DIAGNOSIS — I4901 Ventricular fibrillation: Secondary | ICD-10-CM

## 2011-08-19 LAB — REMOTE ICD DEVICE
BRDY-0002RV: 40 {beats}/min
CHARGE TIME: 18.2 s
DEV-0020ICD: NEGATIVE
HV IMPEDENCE: 65 Ohm
RV LEAD AMPLITUDE: 9.4 mv
RV LEAD IMPEDENCE ICD: 530 Ohm
TOT-0006: 20130110000000
VF: 0

## 2011-08-23 ENCOUNTER — Encounter: Payer: Self-pay | Admitting: *Deleted

## 2011-08-26 NOTE — Progress Notes (Signed)
Remote icd check  

## 2011-10-24 ENCOUNTER — Encounter: Payer: Self-pay | Admitting: *Deleted

## 2011-11-01 ENCOUNTER — Ambulatory Visit (INDEPENDENT_AMBULATORY_CARE_PROVIDER_SITE_OTHER): Payer: Medicare Other | Admitting: Internal Medicine

## 2011-11-01 ENCOUNTER — Encounter: Payer: Self-pay | Admitting: Internal Medicine

## 2011-11-01 VITALS — BP 120/70 | HR 80 | Ht 64.0 in | Wt 175.0 lb

## 2011-11-01 DIAGNOSIS — I4901 Ventricular fibrillation: Secondary | ICD-10-CM

## 2011-11-01 DIAGNOSIS — Z95810 Presence of automatic (implantable) cardiac defibrillator: Secondary | ICD-10-CM

## 2011-11-01 DIAGNOSIS — I2510 Atherosclerotic heart disease of native coronary artery without angina pectoris: Secondary | ICD-10-CM

## 2011-11-01 LAB — ICD DEVICE OBSERVATION
BATTERY VOLTAGE: 2.58 V
RV LEAD IMPEDENCE ICD: 500 Ohm
VENTRICULAR PACING ICD: 1 pct

## 2011-11-01 NOTE — Patient Instructions (Signed)
Your physician wants you to follow-up in: 1 year with Dr. Klein. You will receive a reminder letter in the mail two months in advance. If you don't receive a letter, please call our office to schedule the follow-up appointment.  Your physician recommends that you continue on your current medications as directed. Please refer to the Current Medication list given to you today.  

## 2011-11-01 NOTE — Assessment & Plan Note (Signed)
The patient's device was interrogated.  The information was reviewed. No changes were made in the programming.   The device was implanted for reasons that are not clear. I broached the subject with her as to whether we should review implantation indications prior to its being replaced. She is not inclined at this point to review it and would like the device reimplanted at that point

## 2011-11-01 NOTE — Assessment & Plan Note (Signed)
Stable. I have asked her to review with Dr. Hedy Jacob whether she needs to stay on Plavix

## 2011-11-01 NOTE — Assessment & Plan Note (Signed)
I think this occurred in the context of MI.

## 2011-11-01 NOTE — Progress Notes (Signed)
  HPI  Karen [name] is a 66 y.o. female in followup for ICD implantation for primary prevention in the setting of ischemic heart disease.  This was done in 21st apparently has good EP study showed her to have inducible ventricular arrhythmias. Subsequently, she presented with acute MI and underwent left main stenting followed by emergency bypass surgery done by Dr. Marline Backbone and subsequent catheterization demonstrated normal left ventricular function.  Her primary cardiologist is JV  Her symptoms are stable. She denies significant chest pain. She does take her isosorbide and ranolazine for this and finds that these have been helpful.  She is also on Plavix.    Past Medical History  Diagnosis Date  . Postsurgical aortocoronary bypass status     hx of it.   Marland Kitchen CAD (coronary artery disease)   . Acute myocardial infarction, unspecified site, episode of care unspecified   . HTN (hypertension)   . Other and unspecified hyperlipidemia   . Other diseases of lung, not elsewhere classified   . Headache   . Allergic rhinitis, cause unspecified   . GERD (gastroesophageal reflux disease)     Past Surgical History  Procedure Date  . Cardiac defibrillator placement 03/2006    Guidant. remote-yes.   . Coronary artery bypass graft     x4  . Stent implant     x1. possibly x2.     Current Outpatient Prescriptions  Medication Sig Dispense Refill  . amLODipine (NORVASC) 5 MG tablet Take 5 mg by mouth daily.        Marland Kitchen aspirin 325 MG tablet Take 81 mg by mouth daily.       Marland Kitchen atorvastatin (LIPITOR) 20 MG tablet Take 20 mg by mouth daily.      . clopidogrel (PLAVIX) 75 MG tablet Take 75 mg by mouth daily.        . furosemide (LASIX) 20 MG tablet Take 20 mg by mouth daily.        . isosorbide mononitrate (IMDUR) 120 MG 24 hr tablet Take 120 mg by mouth daily.        Marland Kitchen lisinopril (PRINIVIL,ZESTRIL) 10 MG tablet Take 10 mg by mouth daily.        . metoprolol tartrate (LOPRESSOR) 25 MG tablet Take 25 mg by  mouth 2 (two) times daily.        . nitroGLYCERIN (NITROSTAT) 0.4 MG SL tablet Place 0.4 mg under the tongue every 5 (five) minutes as needed.        . pantoprazole (PROTONIX) 40 MG tablet Take 40 mg by mouth daily.      . ranolazine (RANEXA) 500 MG 12 hr tablet Take 500 mg by mouth 2 (two) times daily.          Allergies  Allergen Reactions  . Codeine     Review of Systems negative except from HPI and PMH  Physical Exam BP 120/70  Pulse 80  Ht 5\' 4"  (1.626 m)  Wt 175 lb (79.379 kg)  BMI 30.04 kg/m2 Well developed and well nourished in no acute distress HENT normal E scleral and icterus clear Neck Supple JVP flat; carotids brisk and full Clear to ausculation Regular rate and rhythm, +S4 softt with active bowel sounds No clubbing cyanosis none Edema Alert and oriented, grossly normal motor and sensory function Skin Warm and Dry    Assessment and  Plan

## 2011-12-23 ENCOUNTER — Other Ambulatory Visit: Payer: Self-pay | Admitting: Internal Medicine

## 2011-12-23 DIAGNOSIS — Z1231 Encounter for screening mammogram for malignant neoplasm of breast: Secondary | ICD-10-CM

## 2012-01-03 ENCOUNTER — Ambulatory Visit
Admission: RE | Admit: 2012-01-03 | Discharge: 2012-01-03 | Disposition: A | Discharge status: Home / Self Care | Payer: Medicare Other | Source: Ambulatory Visit | Attending: Internal Medicine | Admitting: Internal Medicine

## 2012-01-03 DIAGNOSIS — Z1231 Encounter for screening mammogram for malignant neoplasm of breast: Secondary | ICD-10-CM

## 2012-02-06 ENCOUNTER — Ambulatory Visit (INDEPENDENT_AMBULATORY_CARE_PROVIDER_SITE_OTHER): Payer: Medicare Other | Admitting: *Deleted

## 2012-02-06 ENCOUNTER — Encounter: Payer: Self-pay | Admitting: Internal Medicine

## 2012-02-06 DIAGNOSIS — Z95810 Presence of automatic (implantable) cardiac defibrillator: Secondary | ICD-10-CM

## 2012-02-06 DIAGNOSIS — I4901 Ventricular fibrillation: Secondary | ICD-10-CM

## 2012-02-06 LAB — REMOTE ICD DEVICE
DEV-0020ICD: NEGATIVE
FVT: 0
TOT-0006: 20130418000000
VENTRICULAR PACING ICD: 0 pct
VF: 0

## 2012-02-11 ENCOUNTER — Encounter: Payer: Self-pay | Admitting: *Deleted

## 2012-05-07 ENCOUNTER — Ambulatory Visit (INDEPENDENT_AMBULATORY_CARE_PROVIDER_SITE_OTHER): Payer: Medicare Other | Admitting: *Deleted

## 2012-05-07 DIAGNOSIS — I4901 Ventricular fibrillation: Secondary | ICD-10-CM

## 2012-05-07 DIAGNOSIS — Z95810 Presence of automatic (implantable) cardiac defibrillator: Secondary | ICD-10-CM

## 2012-05-11 LAB — REMOTE ICD DEVICE
DEV-0020ICD: NEGATIVE
DEVICE MODEL ICD: 130884
FVT: 0
HV IMPEDENCE: 61 Ohm
PACEART VT: 0
RV LEAD AMPLITUDE: 8.6 mv

## 2012-05-25 ENCOUNTER — Encounter: Payer: Self-pay | Admitting: *Deleted

## 2012-05-29 ENCOUNTER — Encounter: Payer: Self-pay | Admitting: Internal Medicine

## 2012-08-06 ENCOUNTER — Other Ambulatory Visit: Payer: Self-pay

## 2012-08-06 ENCOUNTER — Ambulatory Visit (INDEPENDENT_AMBULATORY_CARE_PROVIDER_SITE_OTHER): Payer: Medicare Other | Admitting: *Deleted

## 2012-08-06 DIAGNOSIS — Z95810 Presence of automatic (implantable) cardiac defibrillator: Secondary | ICD-10-CM

## 2012-08-06 DIAGNOSIS — I4901 Ventricular fibrillation: Secondary | ICD-10-CM

## 2012-08-13 LAB — REMOTE ICD DEVICE
BATTERY VOLTAGE: 2.58 V
DEVICE MODEL ICD: 130884
FVT: 0
PACEART VT: 0
VENTRICULAR PACING ICD: 0 pct
VF: 0

## 2012-09-11 ENCOUNTER — Encounter: Payer: Self-pay | Admitting: *Deleted

## 2012-09-30 ENCOUNTER — Encounter: Payer: Self-pay | Admitting: Internal Medicine

## 2012-11-09 ENCOUNTER — Encounter: Payer: Self-pay | Admitting: *Deleted

## 2012-11-10 ENCOUNTER — Encounter: Payer: Self-pay | Admitting: Internal Medicine

## 2012-11-10 ENCOUNTER — Ambulatory Visit (INDEPENDENT_AMBULATORY_CARE_PROVIDER_SITE_OTHER): Payer: Medicare Other | Admitting: Internal Medicine

## 2012-11-10 VITALS — BP 112/63 | HR 78 | Ht 64.0 in | Wt 172.0 lb

## 2012-11-10 DIAGNOSIS — I4901 Ventricular fibrillation: Secondary | ICD-10-CM

## 2012-11-10 DIAGNOSIS — I2510 Atherosclerotic heart disease of native coronary artery without angina pectoris: Secondary | ICD-10-CM

## 2012-11-10 LAB — ICD DEVICE OBSERVATION
BRDY-0002RV: 40 {beats}/min
DEV-0020ICD: NEGATIVE
DEVICE MODEL ICD: 130884
RV LEAD THRESHOLD: 0.8 V

## 2012-11-10 NOTE — Assessment & Plan Note (Signed)
chrnic chest pain per Dr Hedy Jacob

## 2012-11-10 NOTE — Assessment & Plan Note (Signed)
The patient's device was interrogated.  The information was reviewed. No changes were made in the programming.   Approaching ERI

## 2012-11-10 NOTE — Assessment & Plan Note (Signed)
No intercurrent Ventricular tachycardia  

## 2012-11-10 NOTE — Patient Instructions (Addendum)
Remote monitoring is used to monitor your Pacemaker of ICD from home. This monitoring reduces the number of office visits required to check your device to one time per year. It allows [place] to keep an eye on the functioning of your device to ensure it is working properly. You are scheduled for a device check from home on 02/11/13.. You may send your transmission at any time that day. If you have a wireless device, the transmission will be sent automatically. After your physician reviews your transmission, you will receive a postcard with your next transmission date.  Your physician wants you to follow-up in: 1 year with Dr. Graciela Husbands. You will receive a reminder letter in the mail two months in advance. If you don't receive a letter, please call our office to schedule the follow-up appointment.  Your physician recommends that you continue on your current medications as directed. Please refer to the Current Medication list given to you today.

## 2012-11-10 NOTE — Progress Notes (Signed)
[facility] Patient Care Team: Dorrene German, MD as PCP - General (Internal Medicine)   HPI  Karen [name] is a 67 y.o. female in followup for ICD implantation for primary prevention in the setting of ischemic heart disease. This was done in 21st apparently has good EP study showed her to have inducible ventricular arrhythmias. Subsequently, she presented with acute MI and underwent left main stenting followed by emergency bypass surgery done by Dr. Marline Backbone and subsequent catheterization demonstrated normal left ventricular function.  Her primary cardiologist is JV   Her symptoms are stable. Shecontinues to have problems with chest pain. This is nonexertional. It does respond to nitroglycerin. she is on multiple medications for this.  Past Medical History  Diagnosis Date  . Postsurgical aortocoronary bypass status     hx of it.   Marland Kitchen CAD (coronary artery disease)   . Acute myocardial infarction, unspecified site, episode of care unspecified   . HTN (hypertension)   . Other and unspecified hyperlipidemia   . Other diseases of lung, not elsewhere classified   . Headache(784.0)   . Allergic rhinitis, cause unspecified   . GERD (gastroesophageal reflux disease)     Past Surgical History  Procedure Laterality Date  . Cardiac defibrillator placement  03/2006    Guidant. remote-yes.   . Coronary artery bypass graft      x4  . Stent implant      x1. possibly x2.     Current Outpatient Prescriptions  Medication Sig Dispense Refill  . amLODipine (NORVASC) 5 MG tablet Take 5 mg by mouth daily.        Marland Kitchen aspirin 325 MG tablet Take 81 mg by mouth daily.       Marland Kitchen atorvastatin (LIPITOR) 20 MG tablet Take 20 mg by mouth daily.      . clopidogrel (PLAVIX) 75 MG tablet Take 75 mg by mouth daily.        . furosemide (LASIX) 20 MG tablet Take 20 mg by mouth daily.        . isosorbide mononitrate (IMDUR) 120 MG 24 hr tablet Take 120 mg by mouth daily.        Marland Kitchen lisinopril (PRINIVIL,ZESTRIL) 10 MG tablet Take  10 mg by mouth daily.        . metoprolol tartrate (LOPRESSOR) 25 MG tablet Take 25 mg by mouth 2 (two) times daily.        . nitroGLYCERIN (NITROSTAT) 0.4 MG SL tablet Place 0.4 mg under the tongue every 5 (five) minutes as needed.        . pantoprazole (PROTONIX) 40 MG tablet Take 40 mg by mouth daily.       No current facility-administered medications for this visit.    Allergies  Allergen Reactions  . Codeine     Review of Systems negative except from HPI and PMH  Physical Exam BP 112/63  Pulse 78  Ht 5\' 4"  (1.626 m)  Wt 172 lb (78.019 kg)  BMI 29.51 kg/m2 Well developed and well nourished in no acute distress HENT normal E scleral and icterus clear Neck Supple JVP flat; carotids brisk and full Clear to ausculation Device pocket well healed; without hematoma or erythema.  There is no tethering  Regular rate and rhythm, no murmurs gallops or rub Soft with active bowel sounds No clubbing cyanosis noneno Edema Alert and oriented, grossly normal motor and sensory function Skin Warm and Dry    fEKG demonstrates sinus rhythm at 78 intervals 16/08/38 Axis is 43  Assessment and  Plan

## 2012-11-13 ENCOUNTER — Encounter: Payer: Self-pay | Admitting: Internal Medicine

## 2013-01-14 ENCOUNTER — Encounter: Payer: Self-pay | Admitting: Internal Medicine

## 2013-01-19 ENCOUNTER — Encounter: Payer: Self-pay | Admitting: *Deleted

## 2013-01-25 ENCOUNTER — Ambulatory Visit (INDEPENDENT_AMBULATORY_CARE_PROVIDER_SITE_OTHER): Payer: Medicare Other | Admitting: Internal Medicine

## 2013-01-25 ENCOUNTER — Encounter: Payer: Self-pay | Admitting: Internal Medicine

## 2013-01-25 VITALS — BP 119/69 | HR 86 | Ht 63.0 in | Wt 178.0 lb

## 2013-01-25 DIAGNOSIS — I2510 Atherosclerotic heart disease of native coronary artery without angina pectoris: Secondary | ICD-10-CM

## 2013-01-25 DIAGNOSIS — I4901 Ventricular fibrillation: Secondary | ICD-10-CM

## 2013-01-25 DIAGNOSIS — Z95810 Presence of automatic (implantable) cardiac defibrillator: Secondary | ICD-10-CM

## 2013-01-25 NOTE — Assessment & Plan Note (Signed)
stable °

## 2013-01-25 NOTE — Assessment & Plan Note (Addendum)
Device has reached ERI   Her husband has been very ill so we will tentatively schedule at the end of the October  .We have reviewed the benefits and risks of generator replacement.  These include but are not limited to lead fracture and infection.  The patient understands, agrees and is willing to proceed.

## 2013-01-25 NOTE — Patient Instructions (Addendum)
CALL OFFICE TO MAKE APPOINTMENT FOR A GENERATOR CHANGE OUT [phone]. MAKE SURE TO CALL us AROUND THE END OF OCTOBER

## 2013-01-25 NOTE — Assessment & Plan Note (Signed)
No recurrent VF

## 2013-01-25 NOTE — Progress Notes (Signed)
Patient Care Team: Dorrene German, MD as PCP - General (Internal Medicine)   HPI  Karen [name] is a 67 y.o. female  Seen in followup for ICD implantation for primary prevention in the setting of ischemic heart disease. This was done  in the context of an EP study showed her to have inducible ventricular arrhythmias. Subsequently, she presented with acute MI and underwent left main stenting followed by emergency bypass surgery done by Dr. Marline Backbone and subsequent catheterization demonstrated normal left ventricular function.  Her primary cardiologist is JV   Her symptoms are stable. Shecontinues to have problems with chest pain. This is nonexertional. It does respond to nitroglycerin. she is on multiple medications for this.    Her device has reached ER*I   Her husband is currently hospitalized and very ill  Past Surgical History  Procedure Laterality Date  . Cardiac defibrillator placement  03/2006    Guidant. remote-yes.   . Coronary artery bypass graft      x4  . Stent implant      x1. possibly x2.     Current Outpatient Prescriptions  Medication Sig Dispense Refill  . amLODipine (NORVASC) 5 MG tablet Take 5 mg by mouth daily.        Marland Kitchen aspirin 325 MG tablet Take 81 mg by mouth daily.       Marland Kitchen atorvastatin (LIPITOR) 20 MG tablet Take 20 mg by mouth daily.      . clopidogrel (PLAVIX) 75 MG tablet Take 75 mg by mouth daily.        . furosemide (LASIX) 20 MG tablet Take 20 mg by mouth daily.        . isosorbide mononitrate (IMDUR) 120 MG 24 hr tablet Take 120 mg by mouth daily.        Marland Kitchen lisinopril (PRINIVIL,ZESTRIL) 10 MG tablet Take 10 mg by mouth daily.        . metoprolol tartrate (LOPRESSOR) 25 MG tablet Take 25 mg by mouth 2 (two) times daily.        . nitroGLYCERIN (NITROSTAT) 0.4 MG SL tablet Place 0.4 mg under the tongue every 5 (five) minutes as needed.        . pantoprazole (PROTONIX) 40 MG tablet Take 40 mg by mouth daily.       No current facility-administered medications  for this visit.    Allergies  Allergen Reactions  . Codeine     Review of Systems negative except from HPI and PMH  Physical Exam BP 119/69  Pulse 86  Ht 5\' 3"  (1.6 m)  Wt 178 lb (80.74 kg)  BMI 31.54 kg/m2  SpO2 100% Well developed and well nourished in no acute distress HENT normal E scleral and icterus clear Neck Supple JVP flat; carotids brisk and full Clear to ausculation Device pocket well healed; without hematoma or erythema.  There is no tethering *Regular rate and rhythm, no murmurs gallops or rub Soft with active bowel sounds No clubbing cyanosis none Edema Alert and oriented, grossly normal motor and sensory function Skin Warm and Dry    Assessment and  Plan

## 2013-01-28 ENCOUNTER — Telehealth: Payer: Self-pay | Admitting: *Deleted

## 2013-01-28 NOTE — Telephone Encounter (Signed)
A user error has taken place: encounter opened in error, closed for administrative reasons.

## 2013-01-29 ENCOUNTER — Encounter: Payer: Medicare Other | Admitting: Internal Medicine

## 2013-02-04 ENCOUNTER — Telehealth: Payer: Self-pay | Admitting: *Deleted

## 2013-02-04 NOTE — Telephone Encounter (Signed)
Left message for patient to call me to schedule generator change.

## 2013-02-11 NOTE — Telephone Encounter (Signed)
Patient requesting to wait until next week to make procedure date. I explained that I would try back near end of next week to schedule her. She reached [facility] September 12. Patient agreeable to plan.

## 2013-02-18 ENCOUNTER — Other Ambulatory Visit: Payer: Medicare Other

## 2013-02-18 ENCOUNTER — Other Ambulatory Visit: Payer: Self-pay | Admitting: *Deleted

## 2013-02-18 ENCOUNTER — Encounter: Payer: Self-pay | Admitting: *Deleted

## 2013-02-18 DIAGNOSIS — I25810 Atherosclerosis of coronary artery bypass graft(s) without angina pectoris: Secondary | ICD-10-CM

## 2013-02-18 NOTE — Telephone Encounter (Signed)
Follow up    Pt called back to see if she should fast for labs    thanks

## 2013-02-18 NOTE — Telephone Encounter (Signed)
Called patient to schedule ICD generator change - scheduled for 03/03/2013. Pre-procedure lab work scheduled for 02/19/2013. Discussed procedure instructions and left instructions at front desk for patient to pick up tomorrow when she comes in for lab work. Patient verbalized understanding of instructions and agreeable to plan.

## 2013-02-19 ENCOUNTER — Other Ambulatory Visit (INDEPENDENT_AMBULATORY_CARE_PROVIDER_SITE_OTHER): Payer: Medicare Other

## 2013-02-19 DIAGNOSIS — I25810 Atherosclerosis of coronary artery bypass graft(s) without angina pectoris: Secondary | ICD-10-CM

## 2013-02-19 LAB — CBC WITH DIFFERENTIAL/PLATELET
Basophils Absolute: 0 10*3/uL (ref 0.0–0.1)
Eosinophils Relative: 1 % (ref 0.0–5.0)
Monocytes Absolute: 0.3 10*3/uL (ref 0.1–1.0)
Monocytes Relative: 6.2 % (ref 3.0–12.0)
Neutrophils Relative %: 49.8 % (ref 43.0–77.0)
Platelets: 179 10*3/uL (ref 150.0–400.0)
RDW: 13.1 % (ref 11.5–14.6)
WBC: 4.5 10*3/uL (ref 4.5–10.5)

## 2013-02-19 LAB — BASIC METABOLIC PANEL
BUN: 11 mg/dL (ref 6–23)
Creatinine, Ser: 1 mg/dL (ref 0.4–1.2)
GFR: 69.56 mL/min (ref >60.00)
Glucose, Bld: 88 mg/dL (ref 70–99)

## 2013-02-26 ENCOUNTER — Encounter (HOSPITAL_COMMUNITY): Payer: Self-pay | Admitting: Pharmacy Technician

## 2013-03-02 MED ORDER — SODIUM CHLORIDE 0.9 % IR SOLN
80.0000 mg | Status: DC
  Filled 2013-03-02: qty 2

## 2013-03-02 MED ORDER — CEFAZOLIN SODIUM-DEXTROSE 2-3 GM-% IV SOLR: 2.0000 g | INTRAVENOUS | Status: DC

## 2013-03-03 ENCOUNTER — Encounter (HOSPITAL_COMMUNITY)
Admission: RE | Disposition: A | Discharge status: Home / Self Care | Payer: Self-pay | Source: Ambulatory Visit | Attending: Internal Medicine

## 2013-03-03 ENCOUNTER — Ambulatory Visit (HOSPITAL_COMMUNITY)
Admission: RE | Admit: 2013-03-03 | Discharge: 2013-03-03 | Disposition: A | Discharge status: Home / Self Care | Payer: Medicare Other | Source: Ambulatory Visit | Attending: Internal Medicine | Admitting: Internal Medicine

## 2013-03-03 DIAGNOSIS — I428 Other cardiomyopathies: Secondary | ICD-10-CM | POA: Insufficient documentation

## 2013-03-03 DIAGNOSIS — I10 Essential (primary) hypertension: Secondary | ICD-10-CM | POA: Insufficient documentation

## 2013-03-03 DIAGNOSIS — Z4502 Encounter for adjustment and management of automatic implantable cardiac defibrillator: Secondary | ICD-10-CM | POA: Insufficient documentation

## 2013-03-03 DIAGNOSIS — I252 Old myocardial infarction: Secondary | ICD-10-CM | POA: Insufficient documentation

## 2013-03-03 DIAGNOSIS — I2510 Atherosclerotic heart disease of native coronary artery without angina pectoris: Secondary | ICD-10-CM | POA: Insufficient documentation

## 2013-03-03 DIAGNOSIS — E785 Hyperlipidemia, unspecified: Secondary | ICD-10-CM | POA: Insufficient documentation

## 2013-03-03 DIAGNOSIS — T82598A Other mechanical complication of other cardiac and vascular devices and implants, initial encounter: Secondary | ICD-10-CM

## 2013-03-03 DIAGNOSIS — K219 Gastro-esophageal reflux disease without esophagitis: Secondary | ICD-10-CM | POA: Insufficient documentation

## 2013-03-03 DIAGNOSIS — I2589 Other forms of chronic ischemic heart disease: Secondary | ICD-10-CM

## 2013-03-03 HISTORY — PX: IMPLANTABLE CARDIOVERTER DEFIBRILLATOR GENERATOR CHANGE: SHX5474

## 2013-03-03 LAB — SURGICAL PCR SCREEN: Staphylococcus aureus: NEGATIVE

## 2013-03-03 SURGERY — IMPLANTABLE CARDIOVERTER DEFIBRILLATOR GENERATOR CHANGE
Anesthesia: LOCAL

## 2013-03-03 MED ORDER — SODIUM CHLORIDE 0.9 % IV SOLN
INTRAVENOUS | Status: DC
  Administered 2013-03-03: 11:00:00 via INTRAVENOUS

## 2013-03-03 MED ORDER — FENTANYL CITRATE 0.05 MG/ML IJ SOLN
INTRAMUSCULAR | Status: AC
  Filled 2013-03-03: qty 2

## 2013-03-03 MED ORDER — CHLORHEXIDINE GLUCONATE 4 % EX LIQD
60.0000 mL | Freq: Once | CUTANEOUS | Status: DC
  Filled 2013-03-03: qty 60

## 2013-03-03 MED ORDER — LIDOCAINE HCL (PF) 1 % IJ SOLN
INTRAMUSCULAR | Status: AC
  Filled 2013-03-03: qty 60

## 2013-03-03 MED ORDER — MIDAZOLAM HCL 5 MG/5ML IJ SOLN
INTRAMUSCULAR | Status: AC
  Filled 2013-03-03: qty 5

## 2013-03-03 MED ORDER — MUPIROCIN 2 % EX OINT
TOPICAL_OINTMENT | CUTANEOUS | Status: AC
  Administered 2013-03-03: 1 via NASAL
  Filled 2013-03-03: qty 22

## 2013-03-03 MED ORDER — MUPIROCIN 2 % EX OINT
TOPICAL_OINTMENT | Freq: Two times a day (BID) | CUTANEOUS | Status: DC
  Administered 2013-03-03: 1 via NASAL
  Filled 2013-03-03: qty 22

## 2013-03-03 MED ORDER — ACETAMINOPHEN 325 MG PO TABS: 325.0000 mg | ORAL_TABLET | ORAL | Status: DC | PRN

## 2013-03-03 MED ORDER — ONDANSETRON HCL 4 MG/2ML IJ SOLN: 4.0000 mg | Freq: Four times a day (QID) | INTRAMUSCULAR | Status: DC | PRN

## 2013-03-03 MED ORDER — SODIUM CHLORIDE 0.9 % IV SOLN: INTRAVENOUS | Status: DC

## 2013-03-03 NOTE — Interval H&P Note (Signed)
History and Physical Interval Note:  03/03/2013 12:05 PM  Karen [name]  has presented today for surgery, with the diagnosis of v fib  The various methods of treatment have been discussed with the patient and family. After consideration of risks, benefits and other options for treatment, the patient has consented to  Procedure(s): IMPLANTABLE CARDIOVERTER DEFIBRILLATOR GENERATOR CHANGE (N/A) as a surgical intervention .  The patient's history has been reviewed, patient examined, no change in status, stable for surgery.  I have reviewed the patient's chart and labs.  Questions were answered to the patient's satisfaction.     Sherryl Manges .icdICD Criteria  Current LVEF (within 6 months):N/A%  NYHA Functional Classification: Class II  Heart Failure History:  Yes, Duration of heart failure since onset is > 9 months  Non-Ischemic Dilated Cardiomyopathy History:  No.  Atrial Fibrillation/Atrial Flutter:  No.  Ventricular Tachycardia History:  Yes, Hemodynamic status unknown. VT Type:  SVT - Monomorphic.  Cardiac Arrest History:  No  History of Syndromes with Risk of Sudden Death:  No.  Previous ICD:  Yes, ICD Type:  Single, Reason for ICD:  Secondary, reason for secondary prevention:  Syncope with Inducible VT.  Electrophysiology Study: Yes, EP Study timeframe: > 6 months, Ventricular arrhythmias induced,  VT ablation NOT performed.  Anticoagulation Therapy:  Patient is NOT on anticoagulation therapy.   Beta Blocker Therapy:  Yes.   Ace Inhibitor/ARB Therapy:  Yes.

## 2013-03-03 NOTE — CV Procedure (Signed)
Preoperative diagnosis cardiomyopathy, previous ICD now @ERI  Postoperative diagnosis same/   Procedure: Generator replacement  Lead repair and pocket revision  Following informed consent the patient was brought to the electrophysiology laboratory in place of the fluoroscopic table in the supine position after routine prep and drape lidocaine was infiltrated in the region of the previous incision and carried down to later the device pocket using sharp dissection and electrocautery. The pocket was opened the device was freed up and was explanted.  Interrogation of the previously implanted ICD ventricular lead [employer]  demonstrated an R wave of 7.4  millivolts., and impedance of 416 ohms, and a pacing threshold of 0.8 volts at 0.5 msec.        The lead ws  inspected. Repair was needed. The lead was then attached to a [employer]  E141 pulse generator, serial number [id number].    Through the device the ICD ventricular lead demonstrated an R wave of 7.0  millivolts., and impedance of 427 ohms, and a pacing threshold of 0.9 volts at 0.5 msec High voltage impedances were 67 ohms  The pocket was revised for the larger foot print and  The device was moved cephalal about 2 inches  The pocket was then irrigated with antibiotic containing saline solution hemostasis was assured and the leads and the device were placed in the pocket. The wound was then closed in 2  layers in normal fashion.A dermabond dressing was applied  The patient tolerated the procedure without apparent complication.  DFT testing was not  performed  Sherryl Manges   \

## 2013-03-03 NOTE — H&P (Signed)
       Patient Care Team: Dorrene German, MD as PCP - General (Internal Medicine)   HPI  Karen [name] is a 67 y.o. female here for ICD generator replacement   She underwent implantation for primary prevention in the setting of ischemic heart disease. This was done in the context of an EP study showed her to have inducible ventricular arrhythmias. Subsequently, she presented with acute MI and underwent left main stenting followed by emergency bypass surgery done by Dr. Marline Backbone and subsequent catheterization demonstrated normal left ventricular function.    Her symptoms are stable. Shecontinues to have problems with chest pain. This is nonexertional. It does respond to nitroglycerin.  Her husband who has been very ill arrived home yesterday  Past Medical History  Diagnosis Date  . Postsurgical aortocoronary bypass status     hx of it.   Marland Kitchen CAD (coronary artery disease)   . Acute myocardial infarction, unspecified site, episode of care unspecified   . HTN (hypertension)   . Other and unspecified hyperlipidemia   . Other diseases of lung, not elsewhere classified   . Headache(784.0)   . Allergic rhinitis, cause unspecified   . GERD (gastroesophageal reflux disease)     Past Surgical History  Procedure Laterality Date  . Cardiac defibrillator placement  03/2006    Guidant. remote-yes.   . Coronary artery bypass graft      x4  . Stent implant      x1. possibly x2.     Current Facility-Administered Medications  Medication Dose Route Frequency Provider Last Rate Last Dose  . 0.9 %  sodium chloride infusion   Intravenous Continuous [name] Salvia, MD 50 mL/hr at 03/03/13 1040    . ceFAZolin (ANCEF) IVPB 2 g/50 mL premix  2 g Intravenous On Call [name] Salvia, MD      . chlorhexidine (HIBICLENS) 4 % liquid 4 application  60 mL Topical Once [name] Salvia, MD      . gentamicin (GARAMYCIN) 80 mg in sodium chloride irrigation 0.9 % 500 mL irrigation  80 mg Irrigation On Call [name] Salvia, MD      . mupirocin ointment (BACTROBAN) 2 %   Nasal BID [name] Salvia, MD   1 application at 03/03/13 1040    Allergies  Allergen Reactions  . Codeine Itching and Swelling  . Percocet [Oxycodone-Acetaminophen] Hives and Itching    Review of Systems negative except from HPI and PMH  Physical Exam BP 111/69  Pulse 71  Temp(Src) 98.2 F (36.8 C) (Oral)  Resp 18  Ht 5\' 3"  (1.6 m)  Wt 168 lb (76.204 kg)  BMI 29.77 kg/m2  SpO2 100% Well developed and nourished in no acute distress HENT normal Neck supple with JVP-flat Clear Device pocket well healed; without hematoma or erythema.  There is no tethering  Regular rate and rhythm, no murmurs or gallops Abd-soft with active BS No Clubbing cyanosis edema Skin-warm and dry A & Oriented  Grossly normal sensory and motor function     Assessment and  Plan  ICM  ICD @ ERI  We have reviewed the benefits and risks of generator replacement.  These include but are not limited to lead fracture and infection.  The patient understands, agrees and is willing to proceed.  She ate breakfast this am;  We will maintain hydration and keep npo

## 2013-03-03 NOTE — Progress Notes (Signed)
Discharge instruction given to pt .  Pt able to verbalize understanding.  Pt denies any pain at this time.  Pt to car via wheelchair.

## 2013-03-05 ENCOUNTER — Telehealth: Payer: Self-pay | Admitting: Internal Medicine

## 2013-03-05 NOTE — Telephone Encounter (Signed)
New message    Have wound ck on 11-19----pt cannot come on that day.  I know she needs to be seen within a certain amount of days----Will you please, please, please call her and reschedule this appt.

## 2013-03-17 ENCOUNTER — Ambulatory Visit: Payer: Medicare Other

## 2013-03-18 ENCOUNTER — Encounter (INDEPENDENT_AMBULATORY_CARE_PROVIDER_SITE_OTHER): Payer: Self-pay

## 2013-03-18 ENCOUNTER — Encounter: Payer: Self-pay | Admitting: Internal Medicine

## 2013-03-18 ENCOUNTER — Ambulatory Visit (INDEPENDENT_AMBULATORY_CARE_PROVIDER_SITE_OTHER): Payer: Medicare Other | Admitting: *Deleted

## 2013-03-18 DIAGNOSIS — I4901 Ventricular fibrillation: Secondary | ICD-10-CM

## 2013-03-18 LAB — MDC_IDC_ENUM_SESS_TYPE_INCLINIC
Brady Statistic RV Percent Paced: 0 %
HighPow Impedance: 59 Ohm
Implantable Pulse Generator Serial Number: 126146
Lead Channel Pacing Threshold Pulse Width: 0.5 ms
Lead Channel Sensing Intrinsic Amplitude: 11.5 mV
Lead Channel Setting Pacing Amplitude: 2.4 V
Lead Channel Setting Sensing Sensitivity: 0.6 mV
Zone Setting Detection Interval: 324 ms

## 2013-03-18 NOTE — Progress Notes (Signed)
Wound check appointment for device change out.  Wound without redness or edema. Incision edges approximated, wound well healed. Normal device function. Thresholds, sensing, and impedances consistent with implant measurements.  Histogram distribution appropriate for patient and level of activity. No mode switches or ventricular arrhythmias noted. Patient educated about wound care, arm mobility, lifting restrictions, shock plan. Latitude 06/21/13.

## 2013-05-23 ENCOUNTER — Other Ambulatory Visit: Payer: Self-pay | Admitting: Interventional Cardiology

## 2013-06-21 ENCOUNTER — Encounter: Payer: Medicare Other | Admitting: *Deleted

## 2013-07-06 ENCOUNTER — Encounter: Payer: Self-pay | Admitting: *Deleted

## 2013-07-08 ENCOUNTER — Ambulatory Visit: Payer: Medicare Other | Admitting: Interventional Cardiology

## 2013-07-12 ENCOUNTER — Ambulatory Visit (INDEPENDENT_AMBULATORY_CARE_PROVIDER_SITE_OTHER): Payer: Medicare Other

## 2013-07-12 ENCOUNTER — Telehealth: Payer: Self-pay | Admitting: *Deleted

## 2013-07-12 DIAGNOSIS — I428 Other cardiomyopathies: Secondary | ICD-10-CM

## 2013-07-12 LAB — MDC_IDC_ENUM_SESS_TYPE_REMOTE
Battery Remaining Longevity: 144 mo
Brady Statistic RV Percent Paced: 0 %
Date Time Interrogation Session: 20150316213700
HighPow Impedance: 60 Ohm
Implantable Pulse Generator Serial Number: 126146
Lead Channel Impedance Value: 427 Ohm
Lead Channel Sensing Intrinsic Amplitude: 4.2 mV
Lead Channel Setting Pacing Amplitude: 2.4 V
Lead Channel Setting Pacing Pulse Width: 0.5 ms
Zone Setting Detection Interval: 324 ms

## 2013-07-12 NOTE — Telephone Encounter (Signed)
Gave pt instructions to send test transmission.  Unable to successfully send transmission; gave pt tech svcs number.

## 2013-07-21 ENCOUNTER — Other Ambulatory Visit: Payer: Self-pay | Admitting: Interventional Cardiology

## 2013-07-25 ENCOUNTER — Other Ambulatory Visit: Payer: Self-pay | Admitting: Interventional Cardiology

## 2013-07-26 NOTE — Progress Notes (Signed)
ICD remote 

## 2013-07-28 ENCOUNTER — Encounter: Payer: Self-pay | Admitting: *Deleted

## 2013-08-05 ENCOUNTER — Encounter: Payer: Self-pay | Admitting: Internal Medicine

## 2013-08-17 ENCOUNTER — Telehealth: Payer: Self-pay | Admitting: Interventional Cardiology

## 2013-08-17 MED ORDER — CLOPIDOGREL BISULFATE 75 MG PO TABS: ORAL_TABLET | ORAL | Status: DC

## 2013-08-17 MED ORDER — METOPROLOL TARTRATE 25 MG PO TABS: ORAL_TABLET | ORAL | Status: DC

## 2013-08-17 MED ORDER — AMLODIPINE BESYLATE 10 MG PO TABS: ORAL_TABLET | ORAL | Status: DC

## 2013-08-17 MED ORDER — LISINOPRIL 10 MG PO TABS: ORAL_TABLET | ORAL | Status: DC

## 2013-08-17 MED ORDER — ATORVASTATIN CALCIUM 20 MG PO TABS: ORAL_TABLET | ORAL | Status: DC

## 2013-08-17 NOTE — Telephone Encounter (Signed)
Pt needs Refills     ATORCASTATIN 20 mg    METOTOPROL 25 mg  AMLODIPINEDESYLATE 10 mg,  LISINOPRIL 10 mg and CLOPIPOGREL 75 mg     faxed to [employer],

## 2013-08-17 NOTE — Telephone Encounter (Signed)
Refills sent

## 2013-08-26 ENCOUNTER — Ambulatory Visit: Payer: Medicare Other | Admitting: Interventional Cardiology

## 2013-09-27 ENCOUNTER — Encounter: Payer: Self-pay | Admitting: Interventional Cardiology

## 2013-09-27 ENCOUNTER — Other Ambulatory Visit: Payer: Medicare Other

## 2013-09-27 ENCOUNTER — Ambulatory Visit (INDEPENDENT_AMBULATORY_CARE_PROVIDER_SITE_OTHER): Payer: Medicare Other | Admitting: Interventional Cardiology

## 2013-09-27 VITALS — BP 132/80 | HR 85 | Ht 63.0 in | Wt 166.0 lb

## 2013-09-27 DIAGNOSIS — E785 Hyperlipidemia, unspecified: Secondary | ICD-10-CM

## 2013-09-27 DIAGNOSIS — I2510 Atherosclerotic heart disease of native coronary artery without angina pectoris: Secondary | ICD-10-CM

## 2013-09-27 DIAGNOSIS — I10 Essential (primary) hypertension: Secondary | ICD-10-CM

## 2013-09-27 DIAGNOSIS — R079 Chest pain, unspecified: Secondary | ICD-10-CM

## 2013-09-27 LAB — HEPATIC FUNCTION PANEL
ALK PHOS: 89 U/L (ref 39–117)
ALT: 19 U/L (ref 0–35)
AST: 19 U/L (ref 0–37)
Albumin: 3.9 g/dL (ref 3.5–5.2)
BILIRUBIN TOTAL: 0.5 mg/dL (ref 0.2–1.2)
Bilirubin, Direct: 0.1 mg/dL (ref 0.0–0.3)
Total Protein: 6.8 g/dL (ref 6.0–8.3)

## 2013-09-27 LAB — LIPID PANEL
CHOL/HDL RATIO: 3
Cholesterol: 132 mg/dL (ref 0–200)
HDL: 50.4 mg/dL (ref >39.00)
LDL CALC: 71 mg/dL (ref 0–99)
Triglycerides: 54 mg/dL (ref 0.0–149.0)
VLDL: 10.8 mg/dL (ref 0.0–40.0)

## 2013-09-27 NOTE — Progress Notes (Signed)
Patient ID: Karen [name], female   DOB: Feb 09, 1946, 68 y.o.   MRN: [id number]    [place], [place], [place]  [address] Phone: [phone] Fax:  [phone]  Date:  09/27/2013   ID:  Karen [name], DOB 03/05/1946, MRN [id number]  PCP:  Philis Fendt, MD      History of Present Illness: Karen [name] is a 68 y.o. female who has had a complicated cardiac history. She had a cardiac arrest in the early 2000. At that time, she had an AICD placed in [place] by Dr. Chuck Hint. In 2007, she had another cardiac arrest at the time of hospitalization. Cardiac cath showed an occluded left main which was angioplastied. She then went to emergency CABG. she recovered from that episode.   Since that time, she has had chronic chest discomfort. His lead to a repeat cardiac cath. It showed that her grafts were closed. Her native left main had open. She did have some other branch vessel disease. She has been managed medically. SHe has had some chest pain since stopping the Ranexa which was no longer covered by insurance.  Last stress test was in July 2012 and showed no evidence of ischemia with normal LV function.  Reports some chest pain 2-3x per month with good relief from nitro. Usually had to take 2 nitros consecutive for relief of chest pain. SHOB with exertion and lying flat occasionally. Walks everyday for 15-49min, slow-paced w/o chest pain. Denies LE edema, dizziness. No bleeding. Lost weight since last visit, was 178lb in March, 2015. Still smokes cigarette occasionally, especially when feeling upset (smoked about 3 cigarettes this past year).    Wt Readings from Last 3 Encounters:  09/27/13 166 lb (75.297 kg)  03/03/13 168 lb (76.204 kg)  03/03/13 168 lb (76.204 kg)     Past Medical History  Diagnosis Date  . Postsurgical aortocoronary bypass status     hx of it.   Marland Kitchen CAD (coronary artery disease)   . Acute myocardial infarction, unspecified site, episode of care  unspecified   . HTN (hypertension)   . Other and unspecified hyperlipidemia   . Other diseases of lung, not elsewhere classified   . Headache(784.0)   . Allergic rhinitis, cause unspecified   . GERD (gastroesophageal reflux disease)     Current Outpatient Prescriptions  Medication Sig Dispense Refill  . acetaminophen (TYLENOL) 500 MG tablet Take 500 mg by mouth every 6 (six) hours as needed for pain.      Marland Kitchen amLODipine (NORVASC) 10 MG tablet take 1 tablet by mouth once daily  30 tablet  1  . aspirin EC 81 MG tablet Take 81 mg by mouth daily.      Marland Kitchen atorvastatin (LIPITOR) 20 MG tablet take 1 tablet by mouth once daily  30 tablet  1  . brimonidine (ALPHAGAN P) 0.1 % SOLN Place 1 drop into the left eye 2 (two) times daily.      . clopidogrel (PLAVIX) 75 MG tablet take 1 tablet by mouth once daily  30 tablet  1  . furosemide (LASIX) 20 MG tablet Take 20 mg by mouth daily.        . isosorbide mononitrate (IMDUR) 120 MG 24 hr tablet take 1 tablet by mouth once daily  30 tablet  2  . lisinopril (PRINIVIL,ZESTRIL) 10 MG tablet take 1 tablet by mouth once daily  30 tablet  1  . metoprolol tartrate (LOPRESSOR) 25 MG tablet take 1 tablet  by mouth twice a day  60 tablet  1  . nitroGLYCERIN (NITROSTAT) 0.4 MG SL tablet Place 0.4 mg under the tongue every 5 (five) minutes as needed for chest pain.       . pantoprazole (PROTONIX) 40 MG tablet Take 40 mg by mouth daily.       No current facility-administered medications for this visit.    Allergies:    Allergies  Allergen Reactions  . Codeine Itching and Swelling  . Percocet [Oxycodone-Acetaminophen] Hives and Itching    Social History:  The patient  reports that she has quit smoking. She does not have any smokeless tobacco history on file.   Family History:  The patient's family history is not on file.   ROS:  Please see the history of present illness.  No nausea, vomiting.  No fevers, chills.  No focal weakness.  No dysuria. CP as described  above.   All other systems reviewed and negative.   PHYSICAL EXAM: VS:  BP 132/80  Pulse 85  Ht 5\' 3"  (1.6 m)  Wt 166 lb (75.297 kg)  BMI 29.41 kg/m2 Well nourished, well developed, in no acute distress HEENT: normal Neck: no JVD, no carotid bruits Cardiac:  normal S1, S2; RRR;  Lungs:  clear to auscultation bilaterally, no wheezing, rhonchi or rales Abd: soft, nontender, no hepatomegaly Ext: no edema Skin: warm and dry Neuro:   no focal abnormalities noted  EKG:  NSR NSST   ASSESSMENT AND PLAN:  Coronary atherosclerosis of native coronary artery  Continue Plavix Tablet, 75 MG, take 1 tablet by mouth once daily, 30, Refills 11 Decrease Ecotrin Tablet Delayed Release, 81 MG, 1 tablet as needed, Orally, Once a day Continue Isosorbide Mononitrate Tablet Extended Release 24 Hour, 120 MG, 1 tablet, Orally, Once a day, 30 days, 30, Refills 11 Refill Amlodipine Besylate Tablet, 10 MG, take 1 tablet by mouth once daily, Orally, 30 days, 30, Refills 11 Notes: CONtinue exercise. Ranexa not approved by insurance anymore. Few episodes of chest pain. Last cath with PTCA in 2009. Stress test in 2012 was normal. She neds to remember to take her meds. Pain is at its worst if she forgets her meds.   Will repeat nuclear stress with her on her meds.   2. Pure hypercholesterolemia  Refill Atorvastatin Calcium Tablet, 20 MG, 1 tablet, Orally, Once a day, 30 day(s), 30, Refills 11 Notes: LDL 71. In 2013.  Lipids to be checked today.   3. Essential hypertension, benign  Continue Lisinopril Tablet, 10 MG, take 1 tablet by mouth once daily, Orally, Once a day, 30, Refills 11 Notes: COntrolled at home. 120/69 is typical reading.    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    Preventive Medicine  Adult topics discussed:  Exercise: 5 days a week, at least 30 minutes of aerobic exercise.      Signed, Mina Marble,  MD, [facility] 09/27/2013 9:48 AM

## 2013-09-27 NOTE — Patient Instructions (Signed)
Your physician has requested that you have a lexiscan myoview. For further information please visit [email or web address]. Please follow instruction sheet, as given.  Your physician recommends that you continue on your current medications as directed. Please refer to the Current Medication list given to you today.  Your physician wants you to follow-up in: 1 year with Dr. Irish Lack. You will receive a reminder letter in the mail two months in advance. If you don't receive a letter, please call our office to schedule the follow-up appointment.

## 2013-09-29 ENCOUNTER — Other Ambulatory Visit: Payer: Self-pay | Admitting: Cardiology

## 2013-09-29 ENCOUNTER — Encounter: Payer: Self-pay | Admitting: Cardiology

## 2013-09-29 DIAGNOSIS — E785 Hyperlipidemia, unspecified: Secondary | ICD-10-CM

## 2013-10-07 ENCOUNTER — Ambulatory Visit (HOSPITAL_COMMUNITY): Payer: Medicare Other | Attending: Internal Medicine | Admitting: Radiology

## 2013-10-07 VITALS — BP 94/49 | Ht 63.0 in | Wt 163.0 lb

## 2013-10-07 DIAGNOSIS — F17200 Nicotine dependence, unspecified, uncomplicated: Secondary | ICD-10-CM | POA: Insufficient documentation

## 2013-10-07 DIAGNOSIS — R079 Chest pain, unspecified: Secondary | ICD-10-CM | POA: Insufficient documentation

## 2013-10-07 DIAGNOSIS — I2510 Atherosclerotic heart disease of native coronary artery without angina pectoris: Secondary | ICD-10-CM

## 2013-10-07 DIAGNOSIS — R42 Dizziness and giddiness: Secondary | ICD-10-CM | POA: Insufficient documentation

## 2013-10-07 DIAGNOSIS — R002 Palpitations: Secondary | ICD-10-CM | POA: Insufficient documentation

## 2013-10-07 DIAGNOSIS — Z951 Presence of aortocoronary bypass graft: Secondary | ICD-10-CM | POA: Insufficient documentation

## 2013-10-07 DIAGNOSIS — I252 Old myocardial infarction: Secondary | ICD-10-CM | POA: Insufficient documentation

## 2013-10-07 DIAGNOSIS — Z95810 Presence of automatic (implantable) cardiac defibrillator: Secondary | ICD-10-CM | POA: Insufficient documentation

## 2013-10-07 DIAGNOSIS — I10 Essential (primary) hypertension: Secondary | ICD-10-CM | POA: Insufficient documentation

## 2013-10-07 DIAGNOSIS — Z8249 Family history of ischemic heart disease and other diseases of the circulatory system: Secondary | ICD-10-CM | POA: Insufficient documentation

## 2013-10-07 MED ORDER — REGADENOSON 0.4 MG/5ML IV SOLN
0.4000 mg | Freq: Once | INTRAVENOUS | Status: AC
  Administered 2013-10-07: 0.4 mg via INTRAVENOUS

## 2013-10-07 MED ORDER — TECHNETIUM TC 99M SESTAMIBI GENERIC - CARDIOLITE
33.0000 | Freq: Once | INTRAVENOUS | Status: AC | PRN
  Administered 2013-10-07: 33 via INTRAVENOUS

## 2013-10-07 MED ORDER — TECHNETIUM TC 99M SESTAMIBI GENERIC - CARDIOLITE
11.0000 | Freq: Once | INTRAVENOUS | Status: AC | PRN
  Administered 2013-10-07: 11 via INTRAVENOUS

## 2013-10-07 NOTE — Progress Notes (Signed)
[facility] [address] [place], [place] [address] [phone]    Cardiology Nuclear Med Study  Karen [name] is a 68 y.o. female     MRN : [id number]     DOB: June 08, 1945  Procedure Date: 10/07/2013  Nuclear Med Background Indication for Stress Test:  Evaluation for Ischemia, Graft Patency and Stent Patency History:  CAD, MI,CABG, AICD, 2012 MPI Normal EF 67% Cardiac Risk Factors: Strong Premature Family History - CAD, Hypertension, Lipids and Smoker  Symptoms:  Chest Pain, Dizziness and Palpitations   Nuclear Pre-Procedure Caffeine/Decaff Intake:  None> 12 hrs NPO After: 8:00pm   Lungs:  clear O2 Sat: 96% on room air. IV 0.9% NS with Angio Cath:  24g  IV Site: R Forearm x 1, tolerated well IV Started by:  Irven Baltimore, RN  Chest Size (in):  34 Cup Size: B  Height: 5\' 3"  (1.6 m)  Weight:  163 lb (73.936 kg)  BMI:  Body mass index is 28.88 kg/(m^2). Tech Comments:  Patient took Lopressor and morning medications except lasix. Irven Baltimore, RN.    Nuclear Med Study 1 or 2 day study: 1 day  Stress Test Type:  Carlton Adam  Reading MD: N/A  Order Authorizing Provider:  Larae Grooms, MD  Resting Radionuclide: Technetium 75m Sestamibi  Resting Radionuclide Dose: 11.0 mCi   Stress Radionuclide:  Technetium 55m Sestamibi  Stress Radionuclide Dose: 33.0 mCi           Stress Protocol Rest HR: 57 Stress HR: 91  Rest BP: 94/49 Stress BP: 120/52  Exercise Time (min): n/a METS: n/a   Predicted Max HR: 153 bpm % Max HR: 59.48 bpm Rate Pressure Product: 10920   Dose of Adenosine (mg):  n/a Dose of Lexiscan: 0.4 mg  Dose of Atropine (mg): n/a Dose of Dobutamine: n/a mcg/kg/min (at max HR)  Stress Test Technologist: Crissie Figures, RN  Nuclear Technologist:  Charlton Amor, CNMT     Rest Procedure:  Myocardial perfusion imaging was performed at rest 45 minutes following the intravenous administration of Technetium 21m Sestamibi. Rest ECG: NSR -  NSR with nonspecific T wave abnormality  Stress Procedure:  The patient received IV Lexiscan 0.4 mg over 15-seconds.  Technetium 71m Sestamibi injected at 30-seconds.  Quantitative spect images were obtained after a 45 minute delay. Stress ECG: No significant change from baseline ECG  QPS Raw Data Images:  Mild breast attenuation.  Normal left ventricular size.  Stress Images:  Normal homogeneous uptake in all areas of the myocardium. Rest Images:  Normal homogeneous uptake in all areas of the myocardium. Subtraction (SDS):  No evidence of ischemia. Transient Ischemic Dilatation (Normal <1.22):  1.12 Lung/Heart Ratio (Normal <0.45):  0.28  Quantitative Gated Spect Images QGS EDV:  81 ml QGS ESV:  28 ml  Impression Exercise Capacity:  Lexiscan with no exercise. BP Response:  Normal blood pressure response. Clinical Symptoms:  There is dyspnea. ECG Impression:  No significant ST segment change suggestive of ischemia. Comparison with Prior Nuclear Study: No images to compare  Overall Impression:  Normal stress nuclear study.  LV Ejection Fraction: 66%.  LV Wall Motion:  NL LV Function; NL Wall Motion  Signed: Fransico Him, MD [facility]

## 2013-10-13 ENCOUNTER — Ambulatory Visit (INDEPENDENT_AMBULATORY_CARE_PROVIDER_SITE_OTHER): Payer: Medicare Other | Admitting: *Deleted

## 2013-10-13 DIAGNOSIS — I428 Other cardiomyopathies: Secondary | ICD-10-CM

## 2013-10-13 NOTE — Progress Notes (Signed)
Remote ICD transmission.   

## 2013-10-18 LAB — MDC_IDC_ENUM_SESS_TYPE_REMOTE
Battery Remaining Longevity: 144 mo
Brady Statistic RV Percent Paced: 0 %
HighPow Impedance: 68 Ohm
Lead Channel Impedance Value: 441 Ohm
Lead Channel Setting Pacing Pulse Width: 0.5 ms
MDC IDC PG SERIAL: 126146
MDC IDC SET LEADCHNL RV PACING AMPLITUDE: 2.4 V
Zone Setting Detection Interval: 324 ms

## 2013-10-26 ENCOUNTER — Other Ambulatory Visit: Payer: Self-pay | Admitting: *Deleted

## 2013-10-26 MED ORDER — ATORVASTATIN CALCIUM 20 MG PO TABS: ORAL_TABLET | ORAL | Status: DC

## 2013-10-26 MED ORDER — LISINOPRIL 10 MG PO TABS: ORAL_TABLET | ORAL | Status: DC

## 2013-10-26 MED ORDER — AMLODIPINE BESYLATE 10 MG PO TABS: ORAL_TABLET | ORAL | Status: DC

## 2013-10-26 MED ORDER — METOPROLOL TARTRATE 25 MG PO TABS: ORAL_TABLET | ORAL | Status: DC

## 2013-10-26 MED ORDER — ISOSORBIDE MONONITRATE ER 120 MG PO TB24: ORAL_TABLET | ORAL | Status: DC

## 2013-10-26 MED ORDER — CLOPIDOGREL BISULFATE 75 MG PO TABS: ORAL_TABLET | ORAL | Status: DC

## 2013-11-02 ENCOUNTER — Encounter: Payer: Self-pay | Admitting: Cardiology

## 2013-11-09 ENCOUNTER — Encounter: Payer: Self-pay | Admitting: Internal Medicine

## 2014-01-17 ENCOUNTER — Encounter: Payer: Medicare Other | Admitting: *Deleted

## 2014-01-17 ENCOUNTER — Telehealth: Payer: Self-pay | Admitting: Cardiology

## 2014-01-17 NOTE — Telephone Encounter (Signed)
Attempted to confirm remote transmission number was busy.

## 2014-01-18 ENCOUNTER — Encounter: Payer: Self-pay | Admitting: Cardiology

## 2014-03-08 ENCOUNTER — Encounter: Payer: Self-pay | Admitting: Cardiology

## 2014-04-07 ENCOUNTER — Encounter (HOSPITAL_COMMUNITY): Payer: Self-pay | Admitting: Internal Medicine

## 2014-04-26 ENCOUNTER — Other Ambulatory Visit: Payer: Self-pay | Admitting: Interventional Cardiology

## 2014-04-26 MED ORDER — ISOSORBIDE MONONITRATE ER 120 MG PO TB24: ORAL_TABLET | ORAL | Status: DC

## 2014-09-28 ENCOUNTER — Other Ambulatory Visit: Payer: Medicare Other

## 2014-10-25 ENCOUNTER — Other Ambulatory Visit: Payer: Self-pay | Admitting: *Deleted

## 2014-10-25 MED ORDER — AMLODIPINE BESYLATE 10 MG PO TABS: ORAL_TABLET | ORAL | Status: DC

## 2014-10-25 MED ORDER — METOPROLOL TARTRATE 25 MG PO TABS: ORAL_TABLET | ORAL | Status: DC

## 2014-10-25 MED ORDER — CLOPIDOGREL BISULFATE 75 MG PO TABS: ORAL_TABLET | ORAL | Status: DC

## 2014-10-25 MED ORDER — LISINOPRIL 10 MG PO TABS: ORAL_TABLET | ORAL | Status: DC

## 2014-10-28 ENCOUNTER — Other Ambulatory Visit: Payer: Self-pay | Admitting: *Deleted

## 2014-10-28 MED ORDER — ISOSORBIDE MONONITRATE ER 120 MG PO TB24: ORAL_TABLET | ORAL | Status: DC

## 2014-10-29 ENCOUNTER — Other Ambulatory Visit: Payer: Self-pay | Admitting: Interventional Cardiology

## 2014-11-23 ENCOUNTER — Other Ambulatory Visit: Payer: Self-pay

## 2014-11-23 MED ORDER — ATORVASTATIN CALCIUM 20 MG PO TABS: ORAL_TABLET | ORAL | Status: DC

## 2014-12-23 ENCOUNTER — Other Ambulatory Visit: Payer: Self-pay

## 2014-12-23 MED ORDER — ISOSORBIDE MONONITRATE ER 120 MG PO TB24: ORAL_TABLET | ORAL | Status: DC

## 2014-12-27 ENCOUNTER — Encounter: Payer: Self-pay | Admitting: Interventional Cardiology

## 2014-12-27 ENCOUNTER — Other Ambulatory Visit: Payer: Self-pay | Admitting: *Deleted

## 2014-12-27 ENCOUNTER — Ambulatory Visit (INDEPENDENT_AMBULATORY_CARE_PROVIDER_SITE_OTHER): Payer: Medicare Other | Admitting: Interventional Cardiology

## 2014-12-27 VITALS — BP 124/70 | HR 65 | Ht 63.0 in | Wt 166.8 lb

## 2014-12-27 DIAGNOSIS — I252 Old myocardial infarction: Secondary | ICD-10-CM

## 2014-12-27 DIAGNOSIS — I2510 Atherosclerotic heart disease of native coronary artery without angina pectoris: Secondary | ICD-10-CM | POA: Diagnosis not present

## 2014-12-27 DIAGNOSIS — E785 Hyperlipidemia, unspecified: Secondary | ICD-10-CM | POA: Diagnosis not present

## 2014-12-27 DIAGNOSIS — I2111 ST elevation (STEMI) myocardial infarction involving right coronary artery: Secondary | ICD-10-CM | POA: Insufficient documentation

## 2014-12-27 DIAGNOSIS — I10 Essential (primary) hypertension: Secondary | ICD-10-CM

## 2014-12-27 LAB — COMPREHENSIVE METABOLIC PANEL
ALBUMIN: 4 g/dL (ref 3.5–5.2)
ALK PHOS: 88 U/L (ref 39–117)
ALT: 16 U/L (ref 0–35)
AST: 18 U/L (ref 0–37)
BILIRUBIN TOTAL: 0.4 mg/dL (ref 0.2–1.2)
BUN: 11 mg/dL (ref 6–23)
CO2: 29 mEq/L (ref 19–32)
Calcium: 9.7 mg/dL (ref 8.4–10.5)
Chloride: 106 mEq/L (ref 96–112)
Creatinine, Ser: 0.99 mg/dL (ref 0.40–1.20)
GFR: 71.6 mL/min (ref >60.00)
Glucose, Bld: 98 mg/dL (ref 70–99)
Potassium: 4 mEq/L (ref 3.5–5.1)
Sodium: 140 mEq/L (ref 135–145)
TOTAL PROTEIN: 6.8 g/dL (ref 6.0–8.3)

## 2014-12-27 LAB — LIPID PANEL
CHOLESTEROL: 137 mg/dL (ref 0–200)
HDL: 51 mg/dL (ref >39.00)
LDL Cholesterol: 75 mg/dL (ref 0–99)
NONHDL: 86.46
TRIGLYCERIDES: 58 mg/dL (ref 0.0–149.0)
Total CHOL/HDL Ratio: 3
VLDL: 11.6 mg/dL (ref 0.0–40.0)

## 2014-12-27 NOTE — Patient Instructions (Signed)
Medication Instructions:  Same-no change  Labwork: Today (Lipids/CMET)  Testing/Procedures: None  Follow-Up: Your physician wants you to follow-up in: 1 year. You will receive a reminder letter in the mail two months in advance. If you don't receive a letter, please call our office to schedule the follow-up appointment.

## 2014-12-27 NOTE — Progress Notes (Signed)
Patient ID: Karen [name], female   DOB: 1945/06/08, 69 y.o.   MRN: [id number]     Cardiology Office Note   Date:  12/27/2014   ID:  Karen [name], DOB [birth year], MRN [id number]  PCP:  Philis Fendt, MD    No chief complaint on file.  CAD  Wt Readings from Last 3 Encounters:  12/27/14 166 lb 12.8 oz (75.66 kg)  10/07/13 163 lb (73.936 kg)  09/27/13 166 lb (75.297 kg)       History of Present Illness: Karen [name] is a 69 y.o. female  who has had a complicated cardiac history. She had a cardiac arrest in the early 2000. At that time, she had an AICD placed in [place] by Dr. Chuck Hint. In 2007, she had another cardiac arrest at the time of hospitalization. Cardiac cath showed an occluded left main which was angioplastied. She then went to emergency CABG. she recovered from that episode.   Since that time, she has had chronic chest discomfort. His lead to a repeat cardiac cath. It showed that her grafts were closed. Her native left main had opened. She did have some other branch vessel disease. She has been managed medically. SHe has had some chest pain since stopping the Ranexa which was no longer covered by insurance, but improved with Imdur. Last stress test was in July 2015and showed no evidence of ischemia with normal LV function.  She has been feeling well of late. She denies any chest discomfort or shortness of breath. She walks twice a day for about 15 minutes at a time.    Past Medical History  Diagnosis Date  . Postsurgical aortocoronary bypass status     hx of it.   Marland Kitchen CAD (coronary artery disease)   . Acute myocardial infarction, unspecified site, episode of care unspecified   . HTN (hypertension)   . Other and unspecified hyperlipidemia   . Other diseases of lung, not elsewhere classified   . Headache(784.0)   . Allergic rhinitis, cause unspecified   . GERD (gastroesophageal reflux disease)     Past Surgical History  Procedure Laterality Date  .  Cardiac defibrillator placement  03/2006    Guidant. remote-yes.   . Coronary artery bypass graft      x4  . Stent implant      x1. possibly x2.   . Implantable cardioverter defibrillator generator change N/A 03/03/2013    Procedure: IMPLANTABLE CARDIOVERTER DEFIBRILLATOR GENERATOR CHANGE;  Surgeon: Deboraha Sprang, MD;  Location: [facility] C[facility] LAB;  Service: Cardiovascular;  Laterality: N/A;     Current Outpatient Prescriptions  Medication Sig Dispense Refill  . acetaminophen (TYLENOL) 500 MG tablet Take 500 mg by mouth every 6 (six) hours as needed for pain.    Marland Kitchen amLODipine (NORVASC) 10 MG tablet take 1 tablet by mouth once daily 30 tablet 2  . aspirin EC 81 MG tablet Take 81 mg by mouth daily.    Marland Kitchen atorvastatin (LIPITOR) 10 MG tablet Take 10 mg by mouth daily.  0  . clopidogrel (PLAVIX) 75 MG tablet take 1 tablet by mouth once daily 30 tablet 5  . furosemide (LASIX) 20 MG tablet Take 20 mg by mouth daily.      . isosorbide mononitrate (IMDUR) 120 MG 24 hr tablet take 1 tablet by mouth once daily 30 tablet 0  . lisinopril (PRINIVIL,ZESTRIL) 10 MG tablet take 1 tablet by mouth once daily 30 tablet 2  . metoprolol tartrate (LOPRESSOR) 25 MG tablet take  1 tablet by mouth twice a day 60 tablet 2  . NITROSTAT 0.4 MG SL tablet TAKE 1 TABLET UNDER THE TONGUE AS DIRECTED SUBLINGUAL 25 tablet 1  . pantoprazole (PROTONIX) 40 MG tablet Take 40 mg by mouth daily.    Marland Kitchen PREVNAR 13 SUSP injection inject 0.5 milliliter intramuscularly  0   No current facility-administered medications for this visit.    Allergies:   Codeine and Percocet    Social History:  The patient  reports that she has quit smoking. She does not have any smokeless tobacco history on file. She reports that she does not drink alcohol or use illicit drugs.   Family History:  The patient's family history includes Heart attack in her mother; Heart disease in her mother.    ROS:  Please see the history of present illness.   Otherwise,  review of systems are positive for needed follow up for a defibrillator..   All other systems are reviewed and negative.    PHYSICAL EXAM: VS:  BP 124/70 mmHg  Pulse 65  Ht '5\' 3"'  (1.6 m)  Wt 166 lb 12.8 oz (75.66 kg)  BMI 29.55 kg/m2 , BMI Body mass index is 29.55 kg/(m^2). GEN: Well nourished, well developed, in no acute distress HEENT: normal Neck: no JVD, carotid bruits, or masses Cardiac: RRR; no murmurs, rubs, or gallops,no edema  Respiratory:  clear to auscultation bilaterally, normal work of breathing GI: soft, nontender, nondistended, + BS MS: no deformity or atrophy Skin: warm and dry, no rash Neuro:  Strength and sensation are intact Psych: euthymic mood, full affect   EKG:   The ekg ordered today demonstrates normal   Recent Labs: No results found for requested labs within last 365 days.   Lipid Panel    Component Value Date/Time   CHOL 132 09/27/2013 1025   TRIG 54.0 09/27/2013 1025   HDL 50.40 09/27/2013 1025   CHOLHDL 3 09/27/2013 1025   VLDL 10.8 09/27/2013 1025   LDLCALC 71 09/27/2013 1025     Other studies Reviewed: Additional studies/ records that were reviewed today with results demonstrating: 2015 nuclear study reviewed-no ischemia.   ASSESSMENT AND PLAN:  1. CAD: Continue aspirin and Plavix. Continue isosorbide. Continue amlodipine as well. She had been on Ranexa in the past but this was not approved by insurance. Last stress test showed no ischemia. Angina is well under control. Continue current medical therapy. 2. Pure hypercholesterolemia: Continue atorvastatin. Will check lipids today along with other labs. 3. Hypertension: Blood pressure well controlled. Continue current medications. 4. 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 Encouraged her to continue to exercise regularly.   Current medicines are reviewed at length with the patient today.  The patient concerns regarding her medicines were addressed.  The following changes have been made:  No  change  Labs/ tests ordered today include:   Orders Placed This Encounter  Procedures  . Lipid Profile  . Comp Met (CMET)  . EKG 12-Lead    Recommend 150 minutes/week of aerobic exercise Low fat, low carb, high fiber diet recommended  Disposition:   FU in one year   Teresita Madura., MD  12/27/2014 1:17 PM    [facility] [place], [place], [place]  [address] Phone: [phone]; Fax: [phone]

## 2015-01-03 ENCOUNTER — Other Ambulatory Visit: Payer: Self-pay

## 2015-01-03 NOTE — Telephone Encounter (Signed)
Approved      Disp Refills Start End    atorvastatin (LIPITOR) 20 MG tablet 30 tablet 1 11/23/2014 12/27/2014    Sig:  take 1 tablet by mouth once daily    Class:  Normal    DAW:  No    Authorizing Provider:  Jettie Booze, MD    Ordering User:  Milderd Meager, CMA

## 2015-01-19 ENCOUNTER — Telehealth: Payer: Self-pay | Admitting: Interventional Cardiology

## 2015-01-19 NOTE — Telephone Encounter (Signed)
Pt calling re all  heart meds that were to be called in at last visit for 90 days with refills and none were called in-uses [facility] [place] --pt out of Isosorbide

## 2015-01-20 ENCOUNTER — Other Ambulatory Visit: Payer: Self-pay

## 2015-01-20 MED ORDER — CLOPIDOGREL BISULFATE 75 MG PO TABS: ORAL_TABLET | ORAL | Status: DC

## 2015-01-20 MED ORDER — FUROSEMIDE 20 MG PO TABS: 20.0000 mg | ORAL_TABLET | Freq: Every day | ORAL | Status: DC

## 2015-01-20 MED ORDER — AMLODIPINE BESYLATE 10 MG PO TABS: ORAL_TABLET | ORAL | Status: DC

## 2015-01-20 MED ORDER — LISINOPRIL 10 MG PO TABS: ORAL_TABLET | ORAL | Status: DC

## 2015-01-20 MED ORDER — ISOSORBIDE MONONITRATE ER 120 MG PO TB24: ORAL_TABLET | ORAL | Status: DC

## 2015-01-20 MED ORDER — METOPROLOL TARTRATE 25 MG PO TABS: ORAL_TABLET | ORAL | Status: DC

## 2015-01-20 MED ORDER — ATORVASTATIN CALCIUM 10 MG PO TABS: 10.0000 mg | ORAL_TABLET | Freq: Every day | ORAL | Status: DC

## 2015-01-20 NOTE — Telephone Encounter (Signed)
All Rx sent in to [facility] for 90 day with 3 refills

## 2015-02-04 DIAGNOSIS — Z23 Encounter for immunization: Secondary | ICD-10-CM | POA: Diagnosis not present

## 2015-03-07 ENCOUNTER — Encounter: Payer: Medicare Other | Admitting: Internal Medicine

## 2015-03-14 ENCOUNTER — Ambulatory Visit (INDEPENDENT_AMBULATORY_CARE_PROVIDER_SITE_OTHER): Payer: Medicare Other | Admitting: Internal Medicine

## 2015-03-14 ENCOUNTER — Encounter: Payer: Self-pay | Admitting: Internal Medicine

## 2015-03-14 VITALS — BP 122/76 | HR 85 | Ht 64.0 in | Wt 169.0 lb

## 2015-03-14 DIAGNOSIS — I4901 Ventricular fibrillation: Secondary | ICD-10-CM | POA: Diagnosis not present

## 2015-03-14 DIAGNOSIS — Z95810 Presence of automatic (implantable) cardiac defibrillator: Secondary | ICD-10-CM

## 2015-03-14 LAB — CUP PACEART INCLINIC DEVICE CHECK
HighPow Impedance: 77 Ohm
Implantable Lead Implant Date: 20071205
Implantable Lead Location: 753860
Implantable Lead Serial Number: 102363
Lead Channel Pacing Threshold Amplitude: 0.6 V
Lead Channel Setting Pacing Pulse Width: 0.5 ms
MDC IDC MSMT LEADCHNL RV IMPEDANCE VALUE: 505 Ohm
MDC IDC MSMT LEADCHNL RV PACING THRESHOLD PULSEWIDTH: 0.5 ms
MDC IDC MSMT LEADCHNL RV SENSING INTR AMPL: 9.9 mV
MDC IDC PG SERIAL: 126146
MDC IDC SESS DTM: 20161115050000
MDC IDC SET LEADCHNL RV PACING AMPLITUDE: 2.4 V
MDC IDC SET LEADCHNL RV SENSING SENSITIVITY: 0.6 mV
MDC IDC STAT BRADY RV PERCENT PACED: 1 % — AB

## 2015-03-14 NOTE — Progress Notes (Signed)
Patient Care Team: Nolene Ebbs, MD as PCP - General (Internal Medicine)   HPI  Karen [name] is a 69 y.o. female Seen in followup for ICD implantation for primary prevention in the setting of ischemic heart disease. This was done in the context of an EP study showed her to have inducible ventricular arrhythmias. Subsequently, she presented with acute MI and underwent left main stenting followed by emergency bypass surgery done by Dr. Faye Ramsay and subsequent catheterization demonstrated normal left ventricular function.  These specifics are somewhat discordant from the story as outlined by Dr. Saundra Shelling her primary cardiologist.  She underwent generator replacement 0214    Her symptoms are stable. Shecontinues to have problems with chest pain. This is nonexertional. It does respond to nitroglycerin. she is on multiple medications for this.      Records and Results [place]   Past Medical History  Diagnosis Date  . Postsurgical aortocoronary bypass status     hx of it.   Marland Kitchen CAD (coronary artery disease)   . Acute myocardial infarction, unspecified site, episode of care unspecified   . HTN (hypertension)   . Other and unspecified hyperlipidemia   . Other diseases of lung, not elsewhere classified   . Headache(784.0)   . Allergic rhinitis, cause unspecified   . GERD (gastroesophageal reflux disease)     Past Surgical History  Procedure Laterality Date  . Cardiac defibrillator placement  03/2006    Guidant. remote-yes.   . Coronary artery bypass graft      x4  . Stent implant      x1. possibly x2.   . Implantable cardioverter defibrillator generator change N/A 03/03/2013    Procedure: IMPLANTABLE CARDIOVERTER DEFIBRILLATOR GENERATOR CHANGE;  Surgeon: Deboraha Sprang, MD;  Location: [facility] CATH LAB;  Service: Cardiovascular;  Laterality: N/A;    Current Outpatient Prescriptions  Medication Sig Dispense Refill  . acetaminophen (TYLENOL) 500 MG tablet Take 500 mg by mouth every 6  (six) hours as needed for pain.    Marland Kitchen amLODipine (NORVASC) 10 MG tablet take 1 tablet by mouth once daily 90 tablet 3  . aspirin EC 81 MG tablet Take 81 mg by mouth daily.    Marland Kitchen atorvastatin (LIPITOR) 10 MG tablet Take 1 tablet (10 mg total) by mouth daily. 90 tablet 3  . clopidogrel (PLAVIX) 75 MG tablet take 1 tablet by mouth once daily 90 tablet 3  . furosemide (LASIX) 20 MG tablet Take 1 tablet (20 mg total) by mouth daily. 90 tablet 3  . isosorbide mononitrate (IMDUR) 120 MG 24 hr tablet take 1 tablet by mouth once daily 90 tablet 3  . lisinopril (PRINIVIL,ZESTRIL) 10 MG tablet take 1 tablet by mouth once daily 90 tablet 3  . metoprolol tartrate (LOPRESSOR) 25 MG tablet take 1 tablet by mouth twice a day 180 tablet 3  . NITROSTAT 0.4 MG SL tablet TAKE 1 TABLET UNDER THE TONGUE AS DIRECTED SUBLINGUAL 25 tablet 1  . pantoprazole (PROTONIX) 40 MG tablet Take 40 mg by mouth daily.    Marland Kitchen PREVNAR 13 SUSP injection inject 0.5 milliliter intramuscularly  0   No current facility-administered medications for this visit.    Allergies  Allergen Reactions  . Codeine Itching and Swelling  . Percocet [Oxycodone-Acetaminophen] Hives and Itching      Review of Systems negative except from HPI and PMH  Physical Exam BP 122/76 mmHg  Pulse 85  Ht '5\' 4"'$  (1.626 m)  Wt 169 lb (76.658  kg)  BMI 28.99 kg/m2 Well developed and well nourished in no acute distress HENT normal E scleral and icterus clear Neck Supple JVP flat; carotids brisk and full Clear to ausculation Device pocket well healed; without hematoma or erythema.  There is no tethering Regular rate and rhythm, no murmurs gallops or rub Soft with active bowel sounds No clubbing cyanosis  Edema Alert and oriented, grossly normal motor and sensory function Skin Warm and Dry    Assessment and  Plan  Cardiac Arrest   Implantable defibrillator  [employer] The patient's device was interrogated.  The information was reviewed. No  changes were made in the programming.    Stable

## 2015-03-14 NOTE — Patient Instructions (Signed)
Medication Instructions: - no changes   Labwork: - none  Procedures/Testing: - none  Follow-Up: - Remote monitoring is used to monitor your Pacemaker of ICD from home. This monitoring reduces the number of office visits required to check your device to one time per year. It allows [place] to keep an eye on the functioning of your device to ensure it is working properly. You are scheduled for a device check from home on 06/13/15. You may send your transmission at any time that day. If you have a wireless device, the transmission will be sent automatically. After your physician reviews your transmission, you will receive a postcard with your next transmission date.  - Your physician wants you to follow-up in: 1 year with Dr. Caryl Comes. You will receive a reminder letter in the mail two months in advance. If you don't receive a letter, please call our office to schedule the follow-up appointment.  Any Additional Special Instructions Will Be Listed Below (If Applicable).

## 2015-04-03 ENCOUNTER — Encounter: Payer: Self-pay | Admitting: Interventional Cardiology

## 2015-06-13 ENCOUNTER — Encounter: Payer: Medicare Other | Admitting: *Deleted

## 2015-06-13 ENCOUNTER — Telehealth: Payer: Self-pay | Admitting: Cardiology

## 2015-06-13 NOTE — Telephone Encounter (Signed)
Attempted to confirm remote transmission with pt. No answer and was unable to leave a message.   

## 2015-06-14 ENCOUNTER — Encounter: Payer: Self-pay | Admitting: Cardiology

## 2015-06-19 ENCOUNTER — Telehealth: Payer: Self-pay | Admitting: Internal Medicine

## 2015-06-19 NOTE — Telephone Encounter (Signed)
Patient has not received machine to do remote checks.

## 2015-06-19 NOTE — Telephone Encounter (Signed)
Spoke w/ pt and informed her that I ordered her home monitor today. Pt verbalized understanding.

## 2015-06-23 ENCOUNTER — Telehealth: Payer: Self-pay | Admitting: Internal Medicine

## 2015-06-23 NOTE — Telephone Encounter (Signed)
Attempted to call pt back. No answer and unable to leave message.

## 2015-06-23 NOTE — Telephone Encounter (Signed)
New message     Pt got the wireless adaptor for her device.  Did we get her remote transmission?

## 2015-06-27 NOTE — Telephone Encounter (Signed)
Spoke w/ pt and informed her that we have not received a transmission from her home monitor. She is going to call tech services to get help trouble shooting the monitor.

## 2015-12-17 ENCOUNTER — Other Ambulatory Visit: Payer: Self-pay | Admitting: Interventional Cardiology

## 2016-01-02 ENCOUNTER — Encounter: Payer: Self-pay | Admitting: Interventional Cardiology

## 2016-01-02 ENCOUNTER — Ambulatory Visit (INDEPENDENT_AMBULATORY_CARE_PROVIDER_SITE_OTHER): Payer: Medicare Other | Admitting: Interventional Cardiology

## 2016-01-02 ENCOUNTER — Encounter (INDEPENDENT_AMBULATORY_CARE_PROVIDER_SITE_OTHER): Payer: Self-pay

## 2016-01-02 VITALS — BP 120/80 | HR 73 | Ht 64.0 in | Wt 160.1 lb

## 2016-01-02 DIAGNOSIS — I10 Essential (primary) hypertension: Secondary | ICD-10-CM | POA: Diagnosis not present

## 2016-01-02 DIAGNOSIS — R0789 Other chest pain: Secondary | ICD-10-CM

## 2016-01-02 DIAGNOSIS — I209 Angina pectoris, unspecified: Secondary | ICD-10-CM | POA: Insufficient documentation

## 2016-01-02 DIAGNOSIS — I252 Old myocardial infarction: Secondary | ICD-10-CM | POA: Diagnosis not present

## 2016-01-02 DIAGNOSIS — E785 Hyperlipidemia, unspecified: Secondary | ICD-10-CM | POA: Diagnosis not present

## 2016-01-02 DIAGNOSIS — I25708 Atherosclerosis of coronary artery bypass graft(s), unspecified, with other forms of angina pectoris: Secondary | ICD-10-CM

## 2016-01-02 MED ORDER — ATORVASTATIN CALCIUM 10 MG PO TABS: 10.0000 mg | ORAL_TABLET | Freq: Every day | ORAL | 3 refills | Status: DC

## 2016-01-02 MED ORDER — METOPROLOL TARTRATE 25 MG PO TABS: ORAL_TABLET | ORAL | 3 refills | Status: DC

## 2016-01-02 MED ORDER — AMLODIPINE BESYLATE 10 MG PO TABS: ORAL_TABLET | ORAL | 3 refills | Status: DC

## 2016-01-02 MED ORDER — LISINOPRIL 10 MG PO TABS: ORAL_TABLET | ORAL | 3 refills | Status: DC

## 2016-01-02 MED ORDER — CLOPIDOGREL BISULFATE 75 MG PO TABS: ORAL_TABLET | ORAL | 0 refills | Status: DC

## 2016-01-02 MED ORDER — FUROSEMIDE 20 MG PO TABS: 20.0000 mg | ORAL_TABLET | Freq: Every day | ORAL | 3 refills | Status: DC

## 2016-01-02 MED ORDER — NITROGLYCERIN 0.4 MG SL SUBL
0.4000 mg | SUBLINGUAL_TABLET | SUBLINGUAL | 3 refills | Status: DC | PRN
Start: 2016-01-02 — End: 2018-02-03

## 2016-01-02 MED ORDER — ISOSORBIDE MONONITRATE ER 120 MG PO TB24: ORAL_TABLET | ORAL | 3 refills | Status: DC

## 2016-01-02 NOTE — Progress Notes (Signed)
Patient ID: Karen [name], female   DOB: 06-Aug-1945, 70 y.o.   MRN: [id number]     Cardiology Office Note   Date:  01/02/2016   ID:  Karen [name], DOB May 18, 1945, MRN [id number]  PCP:  Philis Fendt, MD    No chief complaint on file.  CAD  Wt Readings from Last 3 Encounters:  01/02/16 160 lb 1.9 oz (72.6 kg)  03/14/15 169 lb (76.7 kg)  12/27/14 166 lb 12.8 oz (75.7 kg)       History of Present Illness: Karen [name] is a 70 y.o. female  who has had a complicated cardiac history. She had a cardiac arrest in the early 2000. At that time, she had an AICD placed in [place] by Dr. Chuck Hint. In 2007, she had another cardiac arrest at the time of hospitalization. Cardiac cath showed an occluded left main which was angioplastied. She then went to emergency CABG. she recovered from that episode.   Since that time, she has had chronic chest discomfort. His lead to a repeat cardiac cath in 2008. It showed that her grafts were closed. Her native left main had opened. She did have some other branch vessel disease. She has been managed medically. SHe has had some chest pain since stopping the Ranexa which was no longer covered by insurance, but improved with Imdur. Last stress test was in July 2015and showed no evidence of ischemia with normal LV function.  She has had intermittent chest discomfort. Feels a pressure in the center of her chest. On occasion, there is no associated diaphoresis. It goes away with nitroglycerin under her tongue. She has been out of her medicines for little while. She needs refills. She thinks she does feel better when she has her medications. The discomfort is not consistent with activity. It also can come on with emotional stress more likely. There are other times where she walks with her 73-year-old grandson and has no problems. She can vacuum the house occasionally without problems.    Past Medical History:  Diagnosis Date  . Acute myocardial infarction,  unspecified site, episode of care unspecified   . Allergic rhinitis, cause unspecified   . CAD (coronary artery disease)   . GERD (gastroesophageal reflux disease)   . Headache(784.0)   . HTN (hypertension)   . Other and unspecified hyperlipidemia   . Other diseases of lung, not elsewhere classified   . Postsurgical aortocoronary bypass status    hx of it.     Past Surgical History:  Procedure Laterality Date  . CARDIAC DEFIBRILLATOR PLACEMENT  03/2006   Guidant. remote-yes.   . CORONARY ARTERY BYPASS GRAFT     x4  . IMPLANTABLE CARDIOVERTER DEFIBRILLATOR GENERATOR CHANGE N/A 03/03/2013   Procedure: IMPLANTABLE CARDIOVERTER DEFIBRILLATOR GENERATOR CHANGE;  Surgeon: Deboraha Sprang, MD;  Location: [facility] CATH LAB;  Service: Cardiovascular;  Laterality: N/A;  . stent implant     x1. possibly x2.      Current Outpatient Prescriptions  Medication Sig Dispense Refill  . nitroGLYCERIN (NITROSTAT) 0.4 MG SL tablet Place 0.4 mg under the tongue every 5 (five) minutes as needed for chest pain (X3 DOSES MAX).    Marland Kitchen acetaminophen (TYLENOL) 500 MG tablet Take 500 mg by mouth every 6 (six) hours as needed for pain.    Marland Kitchen amLODipine (NORVASC) 10 MG tablet take 1 tablet by mouth once daily 90 tablet 3  . aspirin EC 81 MG tablet Take 81 mg by mouth daily.    Marland Kitchen  atorvastatin (LIPITOR) 10 MG tablet Take 1 tablet (10 mg total) by mouth daily. 90 tablet 3  . clopidogrel (PLAVIX) 75 MG tablet AS DIRECTED 90 tablet 0  . furosemide (LASIX) 20 MG tablet Take 1 tablet (20 mg total) by mouth daily. 90 tablet 3  . isosorbide mononitrate (IMDUR) 120 MG 24 hr tablet take 1 tablet by mouth once daily 90 tablet 3  . lisinopril (PRINIVIL,ZESTRIL) 10 MG tablet take 1 tablet by mouth once daily 90 tablet 3  . metoprolol tartrate (LOPRESSOR) 25 MG tablet take 1 tablet by mouth twice a day 180 tablet 3  . pantoprazole (PROTONIX) 40 MG tablet Take 40 mg by mouth daily.    Marland Kitchen PREVNAR 13 SUSP injection inject 0.5 milliliter  intramuscularly  0   No current facility-administered medications for this visit.     Allergies:   Codeine and Percocet [oxycodone-acetaminophen]    Social History:  The patient  reports that she has quit smoking. She does not have any smokeless tobacco history on file. She reports that she does not drink alcohol or use drugs.   Family History:  The patient's family history includes Heart attack in her mother; Heart disease in her mother.    ROS:  Please see the history of present illness.   Otherwise, review of systems are positive for needed follow up for a defibrillator..   All other systems are reviewed and negative.    PHYSICAL EXAM: VS:  Ht '5\' 4"'$  (1.626 m)   Wt 160 lb 1.9 oz (72.6 kg)   BMI 27.48 kg/m  , BMI Body mass index is 27.48 kg/m. GEN: Well nourished, well developed, in no acute distress HEENT: normal Neck: no JVD, carotid bruits, or masses Cardiac: RRR; no murmurs, rubs, or gallops,no edema  Respiratory:  clear to auscultation bilaterally, normal work of breathing GI: soft, nontender, nondistended, + BS MS: no deformity or atrophy Skin: warm and dry, no rash Neuro:  Strength and sensation are intact Psych: euthymic mood, full affect   EKG:   The ekg ordered today demonstrates NSR, no ST segment changes   Recent Labs: No results found for requested labs within last 8760 hours.   Lipid Panel    Component Value Date/Time   CHOL 137 12/27/2014 1155   TRIG 58.0 12/27/2014 1155   HDL 51.00 12/27/2014 1155   CHOLHDL 3 12/27/2014 1155   VLDL 11.6 12/27/2014 1155   LDLCALC 75 12/27/2014 1155     Other studies Reviewed: Additional studies/ records that were reviewed today with results demonstrating: 2015 nuclear study reviewed-no ischemia.   ASSESSMENT AND PLAN:  1. CAD: Continue aspirin and Plavix. Continue isosorbide. Continue amlodipine as well. She had been on Ranexa in the past but this was not approved by insurance. Last stress test showed no  ischemia. She has angina. It is intermittent. It is not well activities. It is somewhat inconsistent. She has not had any symptoms she thinks in about 2 months. We'll check echocardiogram to evaluate LV function. If her symptoms persist, would plan for cardiac cath. She has been out of her medications. Will restart her medicines and hopefully this will reduce her symptoms. 2. Pure hypercholesterolemia: Continue atorvastatin. Will check lipids today along with other labs. 3. Hypertension: Blood pressure well controlled. Continue current medications. 4. 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 Encouraged her to continue to exercise regularly.   Current medicines are reviewed at length with the patient today.  The patient concerns regarding her medicines were addressed.  The following changes have been made:  No change  Labs/ tests ordered today include:   No orders of the defined types were placed in this encounter.   Recommend 150 minutes/week of aerobic exercise Low fat, low carb, high fiber diet recommended  Disposition:   FU in one year   Signed, Larae Grooms, MD  01/02/2016 1:26 PM    [facility] [place], [place], [place]  [address] Phone: [phone]; Fax: [phone]

## 2016-01-02 NOTE — Patient Instructions (Signed)
Medication Instructions:  Same-no changes  Labwork: None  Testing/Procedures: Your physician has requested that you have an echocardiogram. Echocardiography is a painless test that uses sound waves to create images of your heart. It provides your doctor with information about the size and shape of your heart and how well your heart's chambers and valves are working. This procedure takes approximately one hour. There are no restrictions for this procedure.  Follow-Up: Based on Echo results  Any Other Special Instructions Will Be Listed Below (If Applicable).     If you need a refill on your cardiac medications before your next appointment, please call your pharmacy.

## 2016-01-02 NOTE — Addendum Note (Signed)
Addended by: Gaetano Net on: 01/02/2016 02:25 PM   Modules accepted: Orders

## 2016-01-16 ENCOUNTER — Other Ambulatory Visit (HOSPITAL_COMMUNITY): Payer: Medicare Other

## 2016-01-29 ENCOUNTER — Ambulatory Visit (HOSPITAL_COMMUNITY): Payer: Medicare Other | Attending: Cardiology

## 2016-01-29 ENCOUNTER — Other Ambulatory Visit: Payer: Self-pay

## 2016-01-29 DIAGNOSIS — R0789 Other chest pain: Secondary | ICD-10-CM

## 2016-01-29 DIAGNOSIS — R079 Chest pain, unspecified: Secondary | ICD-10-CM | POA: Diagnosis not present

## 2016-04-08 ENCOUNTER — Other Ambulatory Visit: Payer: Self-pay | Admitting: Interventional Cardiology

## 2016-04-12 ENCOUNTER — Encounter: Payer: Medicare Other | Admitting: Internal Medicine

## 2016-04-16 ENCOUNTER — Encounter: Payer: Self-pay | Admitting: Internal Medicine

## 2016-04-16 ENCOUNTER — Telehealth: Payer: Self-pay | Admitting: Internal Medicine

## 2016-07-23 ENCOUNTER — Telehealth: Payer: Self-pay | Admitting: Nurse Practitioner

## 2016-07-23 NOTE — Telephone Encounter (Signed)
called pt to scheduled defib check with Amber, no voicemail.

## 2016-07-30 ENCOUNTER — Encounter: Payer: Self-pay | Admitting: Cardiology

## 2016-11-05 ENCOUNTER — Encounter (HOSPITAL_COMMUNITY): Payer: Self-pay | Admitting: *Deleted

## 2016-11-05 ENCOUNTER — Emergency Department (HOSPITAL_COMMUNITY)
Admission: EM | Admit: 2016-11-05 | Discharge: 2016-11-06 | Disposition: A | Discharge status: Home / Self Care | Payer: Medicare Other | Attending: Emergency Medicine | Admitting: Emergency Medicine

## 2016-11-05 DIAGNOSIS — I252 Old myocardial infarction: Secondary | ICD-10-CM | POA: Diagnosis not present

## 2016-11-05 DIAGNOSIS — Z951 Presence of aortocoronary bypass graft: Secondary | ICD-10-CM | POA: Insufficient documentation

## 2016-11-05 DIAGNOSIS — M79661 Pain in right lower leg: Secondary | ICD-10-CM | POA: Diagnosis present

## 2016-11-05 DIAGNOSIS — M79604 Pain in right leg: Secondary | ICD-10-CM | POA: Insufficient documentation

## 2016-11-05 DIAGNOSIS — I2510 Atherosclerotic heart disease of native coronary artery without angina pectoris: Secondary | ICD-10-CM | POA: Diagnosis not present

## 2016-11-05 DIAGNOSIS — M7989 Other specified soft tissue disorders: Secondary | ICD-10-CM

## 2016-11-05 DIAGNOSIS — Z7982 Long term (current) use of aspirin: Secondary | ICD-10-CM | POA: Insufficient documentation

## 2016-11-05 DIAGNOSIS — Z95810 Presence of automatic (implantable) cardiac defibrillator: Secondary | ICD-10-CM | POA: Diagnosis not present

## 2016-11-05 DIAGNOSIS — Z87891 Personal history of nicotine dependence: Secondary | ICD-10-CM | POA: Diagnosis not present

## 2016-11-05 DIAGNOSIS — Z7902 Long term (current) use of antithrombotics/antiplatelets: Secondary | ICD-10-CM | POA: Insufficient documentation

## 2016-11-05 DIAGNOSIS — I10 Essential (primary) hypertension: Secondary | ICD-10-CM | POA: Insufficient documentation

## 2016-11-05 DIAGNOSIS — R2241 Localized swelling, mass and lump, right lower limb: Secondary | ICD-10-CM | POA: Insufficient documentation

## 2016-11-05 DIAGNOSIS — Z79899 Other long term (current) drug therapy: Secondary | ICD-10-CM | POA: Insufficient documentation

## 2016-11-05 LAB — CBC
HEMATOCRIT: 37.9 % (ref 36.0–46.0)
HEMOGLOBIN: 12.3 g/dL (ref 12.0–15.0)
MCH: 29.6 pg (ref 26.0–34.0)
MCHC: 32.5 g/dL (ref 30.0–36.0)
MCV: 91.3 fL (ref 78.0–100.0)
Platelets: 195 10*3/uL (ref 150–400)
RBC: 4.15 MIL/uL (ref 3.87–5.11)
RDW: 12.5 % (ref 11.5–15.5)
WBC: 4.9 10*3/uL (ref 4.0–10.5)

## 2016-11-05 LAB — BASIC METABOLIC PANEL
ANION GAP: 10 (ref 5–15)
BUN: 12 mg/dL (ref 6–20)
CHLORIDE: 106 mmol/L (ref 101–111)
CO2: 26 mmol/L (ref 22–32)
Calcium: 9.6 mg/dL (ref 8.9–10.3)
Creatinine, Ser: 1.01 mg/dL — ABNORMAL HIGH (ref 0.44–1.00)
GFR calc non Af Amer: 55 mL/min — ABNORMAL LOW (ref >60)
Glucose, Bld: 118 mg/dL — ABNORMAL HIGH (ref 65–99)
POTASSIUM: 3.6 mmol/L (ref 3.5–5.1)
Sodium: 142 mmol/L (ref 135–145)

## 2016-11-05 NOTE — ED Triage Notes (Signed)
Pt here with right right leg pain, pain is increased with walking and standing.  Not associated with any trauma.  Pt statse that its a "aching, pins and needles and tightness".  No obvious swelling noted at this time. No sob or Cp with this

## 2016-11-05 NOTE — ED Notes (Signed)
Spoke with Dr. Billy Fischer.  V.O for labs.  Pt may go to fast track

## 2016-11-05 NOTE — ED Notes (Signed)
Pt has strong bilateral pedal pulses

## 2016-11-05 NOTE — ED Provider Notes (Signed)
[facility] DEPT Provider Note   CSN: [id number] Arrival date & time: 11/05/16  6283  By signing my name below, I, Reola Mosher, attest that this documentation has been prepared under the direction and in the presence of Varney Biles, MD. Electronically Signed: Reola Mosher, ED Scribe. 11/06/16. 12:12 AM.  History   Chief Complaint Chief Complaint  Patient presents with  . Leg Pain   The history is provided by the patient. No language interpreter was used.    HPI Comments: Karen [name] is a 71 y.o. female with a PMHx of CAD s/p CABG and stent placement, HTN/HLD, prior MI, who presents to the Emergency Department complaining of persistent right lower leg pain beginning yesterday, worsening throughout today. She reports that her pain begins in her right foot and lower calf and radiates upwards. No recent trauma or injury to the leg. Pt reports that she has experienced similar pain to the leg associated with her ongoing lower back issues. She notes associated paraesthesias to the area of her pain which began today; this is worse with standing or ambulating. Her leg pain is also worse with standing and ambulation. No h/o PE/DVT, recent long travel, surgery, fracture, prolonged immobilization, or recent usage of exogenous estrogen therapy. No h/o cancer. She denies chest pain, shortness of breath, or any other associated symptoms.   Past Medical History:  Diagnosis Date  . Acute myocardial infarction, unspecified site, episode of care unspecified   . Allergic rhinitis, cause unspecified   . CAD (coronary artery disease)   . GERD (gastroesophageal reflux disease)   . Headache(784.0)   . HTN (hypertension)   . Other and unspecified hyperlipidemia   . Other diseases of lung, not elsewhere classified   . Postsurgical aortocoronary bypass status    hx of it.    Patient Active Problem List   Diagnosis Date Noted  . Angina pectoris ([place]) 01/02/2016  . Old MI  (myocardial infarction) 12/27/2014  . ICD-[employer] 11/02/2010  . DYSPNEA ON EXERTION 10/26/2009  . VENTRICULAR FIBRILLATION 10/16/2008  . Coronary artery disease-prior MI  s/p CABG 07/27/2007  . PULMONARY NODULE, RIGHT LOWER LOBE 07/27/2007  . HEADACHE, CHRONIC 07/27/2007  . Hyperlipidemia 07/24/2007  . Essential hypertension 07/24/2007  . G E R D 07/24/2007   Past Surgical History:  Procedure Laterality Date  . CARDIAC DEFIBRILLATOR PLACEMENT  03/2006   Guidant. remote-yes.   . CORONARY ARTERY BYPASS GRAFT     x4  . IMPLANTABLE CARDIOVERTER DEFIBRILLATOR GENERATOR CHANGE N/A 03/03/2013   Procedure: IMPLANTABLE CARDIOVERTER DEFIBRILLATOR GENERATOR CHANGE;  Surgeon: Deboraha Sprang, MD;  Location: [facility] CATH LAB;  Service: Cardiovascular;  Laterality: N/A;  . stent implant     x1. possibly x2.    OB History    No data available     Home Medications    Prior to Admission medications   Medication Sig Start Date End Date Taking? Authorizing Provider  amLODipine (NORVASC) 10 MG tablet take 1 tablet by mouth once daily 01/02/16  Yes Jettie Booze, MD  aspirin EC 81 MG tablet Take 81 mg by mouth daily.   Yes [provider]  atorvastatin (LIPITOR) 10 MG tablet Take 1 tablet (10 mg total) by mouth daily. 01/02/16  Yes Jettie Booze, MD  clopidogrel (PLAVIX) 75 MG tablet TAKE AS DIRECTED Patient taking differently: take 1 tablet daily 04/09/16  Yes Jettie Booze, MD  furosemide (LASIX) 20 MG tablet Take 1 tablet (20 mg total) by mouth  daily. 01/02/16  Yes Jettie Booze, MD  isosorbide mononitrate (IMDUR) 120 MG 24 hr tablet take 1 tablet by mouth once daily 01/02/16  Yes Jettie Booze, MD  lisinopril (PRINIVIL,ZESTRIL) 10 MG tablet take 1 tablet by mouth once daily 01/02/16  Yes Jettie Booze, MD  metoprolol tartrate (LOPRESSOR) 25 MG tablet take 1 tablet by mouth twice a day 01/02/16  Yes Jettie Booze, MD  nitroGLYCERIN (NITROSTAT) 0.4  MG SL tablet Place 1 tablet (0.4 mg total) under the tongue every 5 (five) minutes as needed for chest pain (X3 DOSES MAX). 01/02/16  Yes Jettie Booze, MD  pantoprazole (PROTONIX) 40 MG tablet Take 40 mg by mouth daily.   Yes [provider]  acetaminophen (TYLENOL) 325 MG tablet Take 2 tablets (650 mg total) by mouth every 6 (six) hours as needed. 11/06/16   Varney Biles, MD   Family History Family History  Problem Relation Age of Onset  . Heart attack Mother   . Heart disease Mother    Social History Social History  Substance Use Topics  . Smoking status: Former Research scientist (life sciences)  . Smokeless tobacco: Not on file  . Alcohol use No   Allergies   Codeine and Percocet [oxycodone-acetaminophen]  Review of Systems Review of Systems A complete review of systems was obtained and all systems are negative except as noted in the HPI and PMH.   Physical Exam Updated Vital Signs BP (!) 111/55   Pulse 74   Temp 98.3 F (36.8 C) (Oral)   Resp 18   Ht 5\' 3"  (1.6 m)   Wt 72.6 kg (160 lb)   SpO2 100%   BMI 28.34 kg/m   Physical Exam  Constitutional: She appears well-developed and well-nourished. No distress.  HENT:  Head: Normocephalic and atraumatic.  Eyes: Conjunctivae are normal.  Neck: Normal range of motion.  Cardiovascular: Normal rate.   Pulmonary/Chest: Effort normal.  Abdominal: She exhibits no distension.  Musculoskeletal: Normal range of motion. She exhibits edema and tenderness.  2+ DP pulses b/l. RLE has unilateral swelling and calf tenderness.   Neurological: She is alert.  Skin: Skin is warm. No pallor.  Psychiatric: She has a normal mood and affect. Her behavior is normal.  Nursing note and vitals reviewed.  ED Treatments / Results  DIAGNOSTIC STUDIES: Oxygen Saturation is 100% on RA, normal by my interpretation.   COORDINATION OF CARE: 12:12 AM-Discussed next steps with pt. Pt verbalized understanding and is agreeable with the plan.   Labs (all  labs ordered are listed, but only abnormal results are displayed) Labs Reviewed  BASIC METABOLIC PANEL - Abnormal; Notable for the following:       Result Value   Glucose, Bld 118 (*)    Creatinine, Ser 1.01 (*)    GFR calc non Af Amer 55 (*)    All other components within normal limits  CBC   EKG  EKG Interpretation None      Radiology No results found.  Procedures Procedures   Medications Ordered in ED Medications  traMADol (ULTRAM) tablet 100 mg (100 mg Oral Given 11/06/16 0025)  Rivaroxaban (XARELTO) tablet 15 mg (15 mg Oral Given 11/06/16 0025)     Initial Impression / Assessment and Plan / ED Course  I have reviewed the triage vital signs and the nursing notes.  Pertinent labs & imaging results that were available during my care of the patient were reviewed by me and considered in my medical decision making (see  chart for details).    Pt comes in with cc of leg pain. R leg is swollen as well.  Pt has no hx of PE, DVT and denies any exogenous hormone  use, long distance travels or surgery in the past 6 weeks, active cancer, recent immobilization. She has no signs of infection or trauma. Plan is to get DVT study as outpatient. PCP f/u advised if the DVT study is neg.   Final Clinical Impressions(s) / ED Diagnoses   Final diagnoses:  Right leg swelling  Leg pain, anterior, right   New Prescriptions Discharge Medication List as of 11/06/2016 12:18 AM     I personally performed the services described in this documentation, which was scribed in my presence. The recorded information has been reviewed and is accurate.    Varney Biles, MD 11/06/16 [phone]

## 2016-11-06 ENCOUNTER — Ambulatory Visit (HOSPITAL_BASED_OUTPATIENT_CLINIC_OR_DEPARTMENT_OTHER)
Admission: RE | Admit: 2016-11-06 | Discharge: 2016-11-06 | Disposition: A | Discharge status: Home / Self Care | Payer: Medicare Other | Source: Ambulatory Visit | Attending: Emergency Medicine | Admitting: Emergency Medicine

## 2016-11-06 DIAGNOSIS — M79604 Pain in right leg: Secondary | ICD-10-CM | POA: Insufficient documentation

## 2016-11-06 DIAGNOSIS — M79609 Pain in unspecified limb: Secondary | ICD-10-CM | POA: Diagnosis not present

## 2016-11-06 DIAGNOSIS — M7989 Other specified soft tissue disorders: Secondary | ICD-10-CM | POA: Insufficient documentation

## 2016-11-06 DIAGNOSIS — R2241 Localized swelling, mass and lump, right lower limb: Secondary | ICD-10-CM | POA: Diagnosis not present

## 2016-11-06 MED ORDER — TRAMADOL HCL 50 MG PO TABS
100.0000 mg | ORAL_TABLET | Freq: Once | ORAL | Status: AC
  Administered 2016-11-06: 100 mg via ORAL
  Filled 2016-11-06: qty 2

## 2016-11-06 MED ORDER — RIVAROXABAN 15 MG PO TABS
15.0000 mg | ORAL_TABLET | Freq: Once | ORAL | Status: AC
  Administered 2016-11-06: 15 mg via ORAL
  Filled 2016-11-06: qty 1

## 2016-11-06 MED ORDER — ACETAMINOPHEN 325 MG PO TABS: 650.0000 mg | ORAL_TABLET | Freq: Four times a day (QID) | ORAL | 0 refills | Status: DC | PRN

## 2016-11-06 NOTE — Progress Notes (Signed)
Preliminary results by tech - Right Lower Ext. Venous Duplex Completed. Negative for deep and superficial vein thrombosis. Oda Cogan, BS, RDMS, RVT

## 2016-11-06 NOTE — Discharge Instructions (Signed)
You will need to get the blood clot study tomorrow. See your doctor in 5 days even if the DVT study is normal. If there is no blood clot, we would recommend taking motrin 400 mg along with the tylenol prescription. If there is a blood clot, you will be asked to come to the ER or see your doctor for treatment.

## 2016-12-31 ENCOUNTER — Other Ambulatory Visit: Payer: Self-pay | Admitting: Internal Medicine

## 2016-12-31 DIAGNOSIS — Z1231 Encounter for screening mammogram for malignant neoplasm of breast: Secondary | ICD-10-CM

## 2017-01-09 ENCOUNTER — Other Ambulatory Visit: Payer: Self-pay | Admitting: *Deleted

## 2017-01-09 ENCOUNTER — Other Ambulatory Visit: Payer: Self-pay | Admitting: Interventional Cardiology

## 2017-01-09 MED ORDER — ATORVASTATIN CALCIUM 10 MG PO TABS: 10.0000 mg | ORAL_TABLET | Freq: Every day | ORAL | 0 refills | Status: DC

## 2017-01-09 MED ORDER — METOPROLOL TARTRATE 25 MG PO TABS: ORAL_TABLET | ORAL | 0 refills | Status: DC

## 2017-01-10 ENCOUNTER — Ambulatory Visit: Payer: Medicare Other

## 2017-01-16 ENCOUNTER — Ambulatory Visit: Payer: Medicare Other | Admitting: Interventional Cardiology

## 2017-02-11 ENCOUNTER — Other Ambulatory Visit: Payer: Self-pay | Admitting: Interventional Cardiology

## 2017-02-25 ENCOUNTER — Encounter: Payer: Self-pay | Admitting: Interventional Cardiology

## 2017-02-25 ENCOUNTER — Ambulatory Visit (INDEPENDENT_AMBULATORY_CARE_PROVIDER_SITE_OTHER): Payer: Medicare Other | Admitting: Interventional Cardiology

## 2017-02-25 VITALS — BP 110/80 | HR 74 | Ht 63.0 in | Wt 159.0 lb

## 2017-02-25 DIAGNOSIS — I25708 Atherosclerosis of coronary artery bypass graft(s), unspecified, with other forms of angina pectoris: Secondary | ICD-10-CM

## 2017-02-25 DIAGNOSIS — I10 Essential (primary) hypertension: Secondary | ICD-10-CM | POA: Diagnosis not present

## 2017-02-25 DIAGNOSIS — I252 Old myocardial infarction: Secondary | ICD-10-CM | POA: Diagnosis not present

## 2017-02-25 DIAGNOSIS — Z95810 Presence of automatic (implantable) cardiac defibrillator: Secondary | ICD-10-CM

## 2017-02-25 DIAGNOSIS — E785 Hyperlipidemia, unspecified: Secondary | ICD-10-CM | POA: Diagnosis not present

## 2017-02-25 MED ORDER — LISINOPRIL 10 MG PO TABS: 10.0000 mg | ORAL_TABLET | Freq: Every day | ORAL | 3 refills | Status: DC

## 2017-02-25 MED ORDER — AMLODIPINE BESYLATE 10 MG PO TABS: 10.0000 mg | ORAL_TABLET | Freq: Every day | ORAL | 3 refills | Status: DC

## 2017-02-25 MED ORDER — CLOPIDOGREL BISULFATE 75 MG PO TABS: 75.0000 mg | ORAL_TABLET | Freq: Every day | ORAL | 3 refills | Status: DC

## 2017-02-25 MED ORDER — FUROSEMIDE 20 MG PO TABS: ORAL_TABLET | ORAL | 3 refills | Status: DC

## 2017-02-25 MED ORDER — METOPROLOL TARTRATE 25 MG PO TABS: ORAL_TABLET | ORAL | 3 refills | Status: DC

## 2017-02-25 MED ORDER — ATORVASTATIN CALCIUM 10 MG PO TABS: 10.0000 mg | ORAL_TABLET | Freq: Every day | ORAL | 3 refills | Status: DC

## 2017-02-25 MED ORDER — ISOSORBIDE MONONITRATE ER 120 MG PO TB24: 120.0000 mg | ORAL_TABLET | Freq: Every day | ORAL | 3 refills | Status: DC

## 2017-02-25 NOTE — Progress Notes (Signed)
Cardiology Office Note   Date:  02/25/2017   ID:  Karen [name], Karen [name] August 16, 1945, MRN [id number]  PCP:  Karen Ebbs, MD    No chief complaint on file. CAD   Wt Readings from Last 3 Encounters:  02/25/17 159 lb (72.1 kg)  11/05/16 160 lb (72.6 kg)  01/02/16 160 lb 1.9 oz (72.6 kg)       History of Present Illness: Karen [name] is a 71 y.o. female  who has had a complicated cardiac history. She had a cardiac arrest in the early 2000. At that time, she had an AICD placed in [place] by Dr. Chuck Hint. In 2007, she had another cardiac arrest at the time of hospitalization. Cardiac cath showed an occluded left main which was angioplastied. She then went to emergency CABG. she recovered from that episode.   Since that time, she has had chronic chest discomfort. His lead to a repeat cardiac cath in 2008. It showed that her grafts were closed. Her native left main had opened. She did have some other branch vessel disease. She has been managed medically. SHe has had some chest pain since stopping the Ranexa which was no longer covered by insurance, but improved with Imdur. Last stress test was in July 2015 and showed no evidence of ischemia with normal LV function.  She walks 3x/week, for about 25 minutes.  Going up hill does not cause chest pain.    Denies :  Dizziness. Leg edema. Nitroglycerin use. Orthopnea. Palpitations. Paroxysmal nocturnal dyspnea. Shortness of breath. Syncope.   Rare chest pain if she forgets her medicines.    She had a nosebleed in the summer, but it stopped easily.  No other bleeding issues.       Past Medical History:  Diagnosis Date  . Acute myocardial infarction, unspecified site, episode of care unspecified   . Allergic rhinitis, cause unspecified   . CAD (coronary artery disease)   . GERD (gastroesophageal reflux disease)   . Headache(784.0)   . HTN (hypertension)   . Other and unspecified hyperlipidemia   . Other diseases of lung, not  elsewhere classified   . Postsurgical aortocoronary bypass status    hx of it.     Past Surgical History:  Procedure Laterality Date  . CARDIAC DEFIBRILLATOR PLACEMENT  03/2006   Guidant. remote-yes.   . CORONARY ARTERY BYPASS GRAFT     x4  . IMPLANTABLE CARDIOVERTER DEFIBRILLATOR GENERATOR CHANGE N/A 03/03/2013   Procedure: IMPLANTABLE CARDIOVERTER DEFIBRILLATOR GENERATOR CHANGE;  Surgeon: Deboraha Sprang, MD;  Location: [facility] CATH LAB;  Service: Cardiovascular;  Laterality: N/A;  . stent implant     x1. possibly x2.      Current Outpatient Prescriptions  Medication Sig Dispense Refill  . acetaminophen (TYLENOL) 325 MG tablet Take 2 tablets (650 mg total) by mouth every 6 (six) hours as needed. 60 tablet 0  . amLODipine (NORVASC) 10 MG tablet TAKE 1 TABLET BY MOUTH EVERY DAY 30 tablet 0  . aspirin EC 81 MG tablet Take 81 mg by mouth daily.    Marland Kitchen atorvastatin (LIPITOR) 10 MG tablet Take 1 tablet (10 mg total) by mouth daily. 30 tablet 0  . clopidogrel (PLAVIX) 75 MG tablet Take 75 mg by mouth daily.    . furosemide (LASIX) 20 MG tablet TAKE 1 TABLET(20 MG) BY MOUTH DAILY 30 tablet 0  . isosorbide mononitrate (IMDUR) 120 MG 24 hr tablet Take 1 tablet (120 mg total) by mouth daily. Please keep 02/25/17  appointment for further refills 30 tablet 0  . lisinopril (PRINIVIL,ZESTRIL) 10 MG tablet TAKE 1 TABLET BY MOUTH EVERY DAY 30 tablet 0  . metoprolol tartrate (LOPRESSOR) 25 MG tablet take 1 tablet by mouth twice a day 30 tablet 0  . nitroGLYCERIN (NITROSTAT) 0.4 MG SL tablet Place 1 tablet (0.4 mg total) under the tongue every 5 (five) minutes as needed for chest pain (X3 DOSES MAX). 25 tablet 3  . tiZANidine (ZANAFLEX) 4 MG tablet Take 1 tablet by mouth 2 (two) times daily.     No current facility-administered medications for this visit.     Allergies:   Codeine and Percocet [oxycodone-acetaminophen]    Social History:  The patient  reports that she has quit smoking. She has never used  smokeless tobacco. She reports that she does not drink alcohol or use drugs.   Family History:  The patient's family history includes Heart attack in her mother; Heart disease in her mother.    ROS:  Please see the history of present illness.   Otherwise, review of systems are positive for rare nosebleed.   All other systems are reviewed and negative.    PHYSICAL EXAM: VS:  BP 110/80   Pulse 74   Ht 5\' 3"  (1.6 m)   Wt 159 lb (72.1 kg)   SpO2 97%   BMI 28.17 kg/m  , BMI Body mass index is 28.17 kg/m. GEN: Well nourished, well developed, in no acute distress  HEENT: normal  Neck: no JVD, carotid bruits, or masses Cardiac: RRR; no murmurs, rubs, or gallops,no edema  Respiratory:  clear to auscultation bilaterally, normal work of breathing GI: soft, nontender, nondistended, + BS MS: no deformity or atrophy  Skin: warm and dry, no rash Neuro:  Strength and sensation are intact Psych: euthymic mood, full affect   EKG:   The ekg ordered today demonstrates NSR, no ST segment changes   Recent Labs: 11/05/2016: BUN 12; Creatinine, Ser 1.01; Hemoglobin 12.3; Platelets 195; Potassium 3.6; Sodium 142   Lipid Panel    Component Value Date/Time   CHOL 137 12/27/2014 1155   TRIG 58.0 12/27/2014 1155   HDL 51.00 12/27/2014 1155   CHOLHDL 3 12/27/2014 1155   VLDL 11.6 12/27/2014 1155   LDLCALC 75 12/27/2014 1155     Other studies Reviewed: Additional studies/ records that were reviewed today with results demonstrating: NSR, nonspecific ST changes.   ASSESSMENT AND PLAN:  1. CAD, old MI:  H/o cardiac arrest x 2.  No angina on medical therapy.  Continue current meds.  She will let us know if sx change 2. Hyperlipidemia:  WIll check liver and lipid tests when she is fasting.  Contineu statin at this time given prior CAD.  3. HTN: Controlled. COntinue current meds. 4. AICD f/u with EP.   Current medicines are reviewed at length with the patient today.  The patient concerns  regarding her medicines were addressed.  The following changes have been made:  No change  Labs/ tests ordered today include:  No orders of the defined types were placed in this encounter.   Recommend 150 minutes/week of aerobic exercise Low fat, low carb, high fiber diet recommended  Disposition:   FU in 1 year   Signed, Larae Grooms, MD  02/25/2017 4:28 PM    [facility] [address], [place], [place]  [address] Phone: [phone]; Fax: [phone]

## 2017-02-25 NOTE — Patient Instructions (Signed)
Medication Instructions:  Your physician recommends that you continue on your current medications as directed. Please refer to the Current Medication list given to you today.   Labwork: Your physician recommends that you return for FASTING lab work: LIPIDS, LFTS   Testing/Procedures: None ordered  Follow-Up: Your physician wants you to follow-up in: 1 year with Dr. Irish Lack. You will receive a reminder letter in the mail two months in advance. If you don't receive a letter, please call our office to schedule the follow-up appointment.   Any Other Special Instructions Will Be Listed Below (If Applicable).     If you need a refill on your cardiac medications before your next appointment, please call your pharmacy.

## 2017-03-06 ENCOUNTER — Other Ambulatory Visit: Payer: Medicare Other | Admitting: *Deleted

## 2017-03-06 DIAGNOSIS — E785 Hyperlipidemia, unspecified: Secondary | ICD-10-CM

## 2017-03-06 LAB — HEPATIC FUNCTION PANEL
ALT: 11 IU/L (ref 0–32)
AST: 13 IU/L (ref 0–40)
Albumin: 4.3 g/dL (ref 3.5–4.8)
Alkaline Phosphatase: 95 IU/L (ref 39–117)
Bilirubin Total: 0.4 mg/dL (ref 0.0–1.2)
Bilirubin, Direct: 0.13 mg/dL (ref 0.00–0.40)
Total Protein: 6.6 g/dL (ref 6.0–8.5)

## 2017-03-06 LAB — LIPID PANEL
CHOLESTEROL TOTAL: 149 mg/dL (ref 100–199)
Chol/HDL Ratio: 2.4 ratio (ref 0.0–4.4)
HDL: 61 mg/dL (ref >39)
LDL Calculated: 75 mg/dL (ref 0–99)
TRIGLYCERIDES: 65 mg/dL (ref 0–149)
VLDL CHOLESTEROL CAL: 13 mg/dL (ref 5–40)

## 2017-04-18 ENCOUNTER — Ambulatory Visit: Payer: Medicare Other

## 2017-04-23 ENCOUNTER — Ambulatory Visit
Admission: RE | Admit: 2017-04-23 | Discharge: 2017-04-23 | Disposition: A | Discharge status: Home / Self Care | Payer: Medicare Other | Source: Ambulatory Visit | Attending: Internal Medicine | Admitting: Internal Medicine

## 2017-04-23 DIAGNOSIS — Z1231 Encounter for screening mammogram for malignant neoplasm of breast: Secondary | ICD-10-CM

## 2017-10-31 ENCOUNTER — Encounter: Payer: Self-pay | Admitting: Cardiology

## 2017-12-06 ENCOUNTER — Other Ambulatory Visit: Payer: Self-pay | Admitting: Interventional Cardiology

## 2017-12-31 ENCOUNTER — Encounter: Payer: Self-pay | Admitting: Internal Medicine

## 2018-01-15 ENCOUNTER — Ambulatory Visit (INDEPENDENT_AMBULATORY_CARE_PROVIDER_SITE_OTHER): Payer: Medicare Other | Admitting: Internal Medicine

## 2018-01-15 ENCOUNTER — Encounter: Payer: Self-pay | Admitting: Internal Medicine

## 2018-01-15 VITALS — BP 110/72 | HR 76 | Ht 63.0 in | Wt 164.4 lb

## 2018-01-15 DIAGNOSIS — I25708 Atherosclerosis of coronary artery bypass graft(s), unspecified, with other forms of angina pectoris: Secondary | ICD-10-CM

## 2018-01-15 DIAGNOSIS — I4901 Ventricular fibrillation: Secondary | ICD-10-CM

## 2018-01-15 DIAGNOSIS — I10 Essential (primary) hypertension: Secondary | ICD-10-CM

## 2018-01-15 DIAGNOSIS — Z95810 Presence of automatic (implantable) cardiac defibrillator: Secondary | ICD-10-CM

## 2018-01-15 DIAGNOSIS — I252 Old myocardial infarction: Secondary | ICD-10-CM

## 2018-01-15 LAB — CUP PACEART INCLINIC DEVICE CHECK
Brady Statistic RV Percent Paced: 1 % — CL
HIGH POWER IMPEDANCE MEASURED VALUE: 76 Ohm
Implantable Lead Implant Date: 20071205
Implantable Lead Location: 753860
Implantable Lead Model: 137
Implantable Lead Serial Number: 102363
Lead Channel Pacing Threshold Amplitude: 0.7 V
Lead Channel Pacing Threshold Pulse Width: 0.5 ms
Lead Channel Setting Pacing Amplitude: 2.4 V
Lead Channel Setting Sensing Sensitivity: 0.6 mV
MDC IDC MSMT LEADCHNL RV IMPEDANCE VALUE: 480 Ohm
MDC IDC MSMT LEADCHNL RV SENSING INTR AMPL: 7.7 mV
MDC IDC PG IMPLANT DT: 20141105
MDC IDC SESS DTM: 20190919040000
MDC IDC SET LEADCHNL RV PACING PULSEWIDTH: 0.5 ms
Pulse Gen Serial Number: 126146

## 2018-01-15 NOTE — Patient Instructions (Addendum)
Medication Instructions:  Your physician recommends that you continue on your current medications as directed. Please refer to the Current Medication list given to you today.  Labwork: You will have labs drawn today: CBC, BMP, LFT, Direct LDL  Testing/Procedures: None ordered.  Follow-Up:  Your physician wants you to follow-up in: One Year with Chanetta Marshall, NP. You will receive a reminder letter in the mail two months in advance. If you don't receive a letter, please call our office to schedule the follow-up appointment.   Remote monitoring is used to monitor your Pacemaker of ICD from home. This monitoring reduces the number of office visits required to check your device to one time per year. It allows [place] to keep an eye on the functioning of your device to ensure it is working properly. You are scheduled for a device check from home on 04/16/2018. You may send your transmission at any time that day. If you have a wireless device, the transmission will be sent automatically. After your physician reviews your transmission, you will receive a postcard with your next transmission date.    Any Other Special Instructions Will Be Listed Below (If Applicable).     If you need a refill on your cardiac medications before your next appointment, please call your pharmacy.

## 2018-01-15 NOTE — Progress Notes (Signed)
Patient Care Team: Nolene Ebbs, MD as PCP - General (Internal Medicine)   HPI  Karen [name] is a 72 y.o. female Seen in followup for ICD implantation for primary prevention in the setting of ischemic heart disease.  Initially implanted 2007 with generator replacement in 2014.  This was done in the context of an EP study showed her to have inducible ventricular arrhythmias. Subsequently, she presented with acute MI complicated by ventricular fibrillation and underwent left main stenting followed by emergency bypass surgery done by Dr. Faye Ramsay and subsequent catheterization demonstrated normal left ventricular function.       DATE TEST EF   6/15 MYOVIEW    % No ischemia  10/17 Echo   60-65 %          Denies shortness of breath or edema; nocturnal dyspnea or orthopnea.  She does have chest discomfort which can be quite severe.  It is responsive to nitroglycerin and does not occur she takes her isosorbide regularly.  She describes how much easier life is since her husband passed away in 08-24-22.  He was a double amputee, unable to care for himself.  She also has a bipolar son who is on dialysis and a couple strokes for whom she cares    Records and Results [place]   Past Medical History:  Diagnosis Date  . Acute myocardial infarction, unspecified site, episode of care unspecified   . Allergic rhinitis, cause unspecified   . CAD (coronary artery disease)   . GERD (gastroesophageal reflux disease)   . Headache(784.0)   . HTN (hypertension)   . Other and unspecified hyperlipidemia   . Other diseases of lung, not elsewhere classified   . Postsurgical aortocoronary bypass status    hx of it.     Past Surgical History:  Procedure Laterality Date  . CARDIAC DEFIBRILLATOR PLACEMENT  03/2006   Guidant. remote-yes.   . CORONARY ARTERY BYPASS GRAFT     x4  . IMPLANTABLE CARDIOVERTER DEFIBRILLATOR GENERATOR CHANGE N/A 03/03/2013   Procedure: IMPLANTABLE CARDIOVERTER  DEFIBRILLATOR GENERATOR CHANGE;  Surgeon: Deboraha Sprang, MD;  Location: [facility] CATH LAB;  Service: Cardiovascular;  Laterality: N/A;  . stent implant     x1. possibly x2.     Current Outpatient Medications  Medication Sig Dispense Refill  . acetaminophen (TYLENOL) 325 MG tablet Take 2 tablets (650 mg total) by mouth every 6 (six) hours as needed. 60 tablet 0  . amLODipine (NORVASC) 10 MG tablet Take 1 tablet (10 mg total) by mouth daily. 90 tablet 3  . aspirin EC 81 MG tablet Take 81 mg by mouth daily.    Marland Kitchen atorvastatin (LIPITOR) 10 MG tablet Take 1 tablet (10 mg total) by mouth daily. 90 tablet 3  . clopidogrel (PLAVIX) 75 MG tablet Take 1 tablet (75 mg total) by mouth daily. 90 tablet 3  . furosemide (LASIX) 20 MG tablet TAKE 1 TABLET(20 MG) BY MOUTH DAILY 90 tablet 3  . isosorbide mononitrate (IMDUR) 120 MG 24 hr tablet Take 1 tablet (120 mg total) by mouth daily. Please keep upcoming appt for future refills. Thank you 60 tablet 0  . lisinopril (PRINIVIL,ZESTRIL) 10 MG tablet Take 1 tablet (10 mg total) by mouth daily. 90 tablet 3  . metoprolol tartrate (LOPRESSOR) 25 MG tablet take 1 tablet by mouth twice a day 180 tablet 3  . nitroGLYCERIN (NITROSTAT) 0.4 MG SL tablet Place 1 tablet (0.4 mg total) under the tongue every 5 (five) minutes  as needed for chest pain (X3 DOSES MAX). 25 tablet 3   No current facility-administered medications for this visit.     Allergies  Allergen Reactions  . Codeine Itching and Swelling  . Percocet [Oxycodone-Acetaminophen] Hives and Itching      Review of Systems negative except from HPI and PMH  Physical Exam BP 110/72   Pulse 76   Ht 5\' 3"  (1.6 m)   Wt 164 lb 6.4 oz (74.6 kg)   SpO2 92%   BMI 29.12 kg/m  Well developed and nourished in no acute distress HENT normal Neck supple with JVP-flat Clear Regular rate and rhythm, no murmurs or gallops Abd-soft with active BS No Clubbing cyanosis edema Skin-warm and dry A & Oriented  Grossly  normal sensory and motor function   ECG sinus @ 76 16/07/42  Assessment and  Plan  Ischemic heart disease with prior CABG and normal LV function   Hyperlipidemia   VT NS  Implantable defibrillator  [employer] The patient's device was interrogated.  The information was reviewed. No changes were made in the programming.       Without symptoms of ischemia   Check surveillance labs on ACE  VT nonsustained  Current medical therapy

## 2018-01-16 LAB — BASIC METABOLIC PANEL
BUN/Creatinine Ratio: 11 — ABNORMAL LOW (ref 12–28)
BUN: 13 mg/dL (ref 8–27)
CHLORIDE: 104 mmol/L (ref 96–106)
CO2: 23 mmol/L (ref 20–29)
Calcium: 9.6 mg/dL (ref 8.7–10.3)
Creatinine, Ser: 1.21 mg/dL — ABNORMAL HIGH (ref 0.57–1.00)
GFR calc Af Amer: 52 mL/min/{1.73_m2} — ABNORMAL LOW (ref >59)
GFR, EST NON AFRICAN AMERICAN: 45 mL/min/{1.73_m2} — AB (ref >59)
Glucose: 98 mg/dL (ref 65–99)
POTASSIUM: 4 mmol/L (ref 3.5–5.2)
Sodium: 141 mmol/L (ref 134–144)

## 2018-01-16 LAB — CBC
HEMATOCRIT: 39.6 % (ref 34.0–46.6)
HEMOGLOBIN: 12.8 g/dL (ref 11.1–15.9)
MCH: 29.5 pg (ref 26.6–33.0)
MCHC: 32.3 g/dL (ref 31.5–35.7)
MCV: 91 fL (ref 79–97)
Platelets: 225 10*3/uL (ref 150–450)
RBC: 4.34 x10E6/uL (ref 3.77–5.28)
RDW: 13.1 % (ref 12.3–15.4)
WBC: 5.8 10*3/uL (ref 3.4–10.8)

## 2018-01-16 LAB — HEPATIC FUNCTION PANEL
ALBUMIN: 4.5 g/dL (ref 3.5–4.8)
ALT: 13 IU/L (ref 0–32)
AST: 17 IU/L (ref 0–40)
Alkaline Phosphatase: 92 IU/L (ref 39–117)
Bilirubin Total: 0.3 mg/dL (ref 0.0–1.2)
Bilirubin, Direct: 0.11 mg/dL (ref 0.00–0.40)
Total Protein: 6.5 g/dL (ref 6.0–8.5)

## 2018-01-16 LAB — LDL CHOLESTEROL, DIRECT: LDL DIRECT: 82 mg/dL (ref 0–99)

## 2018-02-02 NOTE — Progress Notes (Signed)
Cardiology Office Note    Date:  02/03/2018   ID:  KELLY RANIERI, DOB [birth year], MRN [id number]  PCP:  Nolene Ebbs, MD  Cardiologist: Larae Grooms, MD EPS: Virl Axe, MD  No chief complaint on file.   History of Present Illness:  Karen [name] is a 72 y.o. female history of cardiac arrest in the early 2000's treated with AICD in [place], replacement in 2014, cardiac arrest with V. fib in 2007 cardiac cath showed occluded left main treated with angioplasty followed by emergency CABG.  Chronic chest pain with repeat cath in 2008 all her grafts were occluded.  Her native left main remained open.  Managed medically.  Last stress test 2015 no ischemia with normal LV function.  Also has hypertension, hyperlipidemia.  Last saw Dr. Caryl Comes 01/15/2018 at which time she was doing well without angina.Last saw Dr. Irish Lack 01/2017.  Patient comes in for yearly f/u. Complains of chest pressure about 3 times/month. Usually when she first gets up in am or when she lays down at night. Usually relieved with rest or NTG. Sometimes she feels like it's heartburn. Walks 1/2 mile daily without chest pain but does complain of leg pain/cramps with walking.      Past Medical History:  Diagnosis Date  . Acute myocardial infarction, unspecified site, episode of care unspecified   . Allergic rhinitis, cause unspecified   . CAD (coronary artery disease)   . GERD (gastroesophageal reflux disease)   . Headache(784.0)   . HTN (hypertension)   . Other and unspecified hyperlipidemia   . Other diseases of lung, not elsewhere classified   . Postsurgical aortocoronary bypass status    hx of it.     Past Surgical History:  Procedure Laterality Date  . CARDIAC DEFIBRILLATOR PLACEMENT  03/2006   [employer]. remote-yes.   . CORONARY ARTERY BYPASS GRAFT     x4  . IMPLANTABLE CARDIOVERTER DEFIBRILLATOR GENERATOR CHANGE N/A 03/03/2013   Procedure: IMPLANTABLE CARDIOVERTER DEFIBRILLATOR GENERATOR CHANGE;   Surgeon: Deboraha Sprang, MD;  Location: [facility] CATH LAB;  Service: Cardiovascular;  Laterality: N/A;  . stent implant     x1. possibly x2.     Current Medications: Current Meds  Medication Sig  . acetaminophen (TYLENOL) 325 MG tablet Take 2 tablets (650 mg total) by mouth every 6 (six) hours as needed.  Marland Kitchen amLODipine (NORVASC) 10 MG tablet Take 1 tablet (10 mg total) by mouth daily.  Marland Kitchen aspirin EC 81 MG tablet Take 81 mg by mouth daily.  Marland Kitchen atorvastatin (LIPITOR) 10 MG tablet Take 1 tablet (10 mg total) by mouth daily.  . clopidogrel (PLAVIX) 75 MG tablet Take 1 tablet (75 mg total) by mouth daily.  . furosemide (LASIX) 20 MG tablet TAKE 1 TABLET(20 MG) BY MOUTH DAILY  . isosorbide mononitrate (IMDUR) 120 MG 24 hr tablet Take 1 tablet (120 mg total) by mouth daily.  Marland Kitchen lisinopril (PRINIVIL,ZESTRIL) 10 MG tablet Take 1 tablet (10 mg total) by mouth daily.  . metoprolol tartrate (LOPRESSOR) 25 MG tablet take 1 tablet by mouth twice a day  . nitroGLYCERIN (NITROSTAT) 0.4 MG SL tablet Place 1 tablet (0.4 mg total) under the tongue every 5 (five) minutes as needed for chest pain (X3 DOSES MAX).  . [DISCONTINUED] amLODipine (NORVASC) 10 MG tablet Take 1 tablet (10 mg total) by mouth daily.  . [DISCONTINUED] atorvastatin (LIPITOR) 10 MG tablet Take 1 tablet (10 mg total) by mouth daily.  . [DISCONTINUED] clopidogrel (PLAVIX) 75 MG tablet Take  1 tablet (75 mg total) by mouth daily.  . [DISCONTINUED] furosemide (LASIX) 20 MG tablet TAKE 1 TABLET(20 MG) BY MOUTH DAILY  . [DISCONTINUED] isosorbide mononitrate (IMDUR) 120 MG 24 hr tablet Take 1 tablet (120 mg total) by mouth daily. Please keep upcoming appt for future refills. Thank you  . [DISCONTINUED] lisinopril (PRINIVIL,ZESTRIL) 10 MG tablet Take 1 tablet (10 mg total) by mouth daily.  . [DISCONTINUED] metoprolol tartrate (LOPRESSOR) 25 MG tablet take 1 tablet by mouth twice a day  . [DISCONTINUED] nitroGLYCERIN (NITROSTAT) 0.4 MG SL tablet Place 1 tablet  (0.4 mg total) under the tongue every 5 (five) minutes as needed for chest pain (X3 DOSES MAX).     Allergies:   Codeine and Percocet [oxycodone-acetaminophen]   Social History   Socioeconomic History  . Marital status: Legally Separated    Spouse name: Not on file  . Number of children: Not on file  . Years of education: Not on file  . Highest education level: Not on file  Occupational History  . Not on file  Social Needs  . Financial resource strain: Not on file  . Food insecurity:    Worry: Not on file    Inability: Not on file  . Transportation needs:    Medical: Not on file    Non-medical: Not on file  Tobacco Use  . Smoking status: Former Research scientist (life sciences)  . Smokeless tobacco: Never Used  Substance and Sexual Activity  . Alcohol use: No  . Drug use: No  . Sexual activity: Not on file  Lifestyle  . Physical activity:    Days per week: Not on file    Minutes per session: Not on file  . Stress: Not on file  Relationships  . Social connections:    Talks on phone: Not on file    Gets together: Not on file    Attends religious service: Not on file    Active member of club or organization: Not on file    Attends meetings of clubs or organizations: Not on file    Relationship status: Not on file  Other Topics Concern  . Not on file  Social History Narrative   Married, 4 children.      Family History:  The patient's family history includes Heart attack in her mother; Heart disease in her mother.   ROS:   Please see the history of present illness.    Review of Systems  Constitution: Negative.  HENT: Negative.   Eyes: Negative.   Cardiovascular: Positive for chest pain and claudication.  Respiratory: Negative.   Hematologic/Lymphatic: Bruises/bleeds easily.  Musculoskeletal: Negative.  Negative for joint pain.  Gastrointestinal: Positive for heartburn.  Genitourinary: Negative.   Neurological: Positive for headaches.   All other systems reviewed and are  negative.   PHYSICAL EXAM:   VS:  BP 122/76   Pulse 78   Ht 5\' 3"  (1.6 m)   Wt 168 lb 12.8 oz (76.6 kg)   SpO2 99%   BMI 29.90 kg/m   Physical Exam  GEN: Well nourished, well developed, in no acute distress  Neck: Right carotid bruit no JVD,  or masses Cardiac:RRR; no murmurs, rubs, or gallops  Respiratory:  clear to auscultation bilaterally, normal work of breathing GI: soft, nontender, nondistended, + BS Ext: without cyanosis, clubbing, or edema, Good distal pulses bilaterally Neuro:  Alert and Oriented x 3 Psych: euthymic mood, full affect  Wt Readings from Last 3 Encounters:  02/03/18 168 lb 12.8 oz (76.6  kg)  01/15/18 164 lb 6.4 oz (74.6 kg)  02/25/17 159 lb (72.1 kg)      Studies/Labs Reviewed:   EKG:  EKG is not ordered today.   Recent Labs: 01/15/2018: ALT 13; BUN 13; Creatinine, Ser 1.21; Hemoglobin 12.8; Platelets 225; Potassium 4.0; Sodium 141   Lipid Panel    Component Value Date/Time   CHOL 149 03/06/2017 1037   TRIG 65 03/06/2017 1037   HDL 61 03/06/2017 1037   CHOLHDL 2.4 03/06/2017 1037   CHOLHDL 3 12/27/2014 1155   VLDL 11.6 12/27/2014 1155   LDLCALC 75 03/06/2017 1037   LDLDIRECT 82 01/15/2018 1536    Additional studies/ records that were reviewed today include:  2D echo 2017Study Conclusions   - Left ventricle: The cavity size was normal. Systolic function was   normal. The estimated ejection fraction was in the range of 60%   to 65%. Wall motion was normal; there were no regional wall   motion abnormalities. Left ventricular diastolic function   parameters were normal. - Aortic valve: Trileaflet; normal thickness leaflets. There was no   regurgitation. - Aortic root: The aortic root was normal in size. - Mitral valve: Structurally normal valve. There was mild   regurgitation. - Left atrium: The atrium was normal in size. - Right ventricle: The cavity size was normal. Wall thickness was   normal. Pacer wire or catheter noted in right  ventricle. Systolic   function was mildly reduced. - Right atrium: The atrium was normal in size. Pacer wire or   catheter noted in right atrium. - Tricuspid valve: There was mild regurgitation. - Inferior vena cava: The vessel was normal in size. - Pericardium, extracardiac: There was no pericardial effusion.       ASSESSMENT:    1. Coronary artery disease involving coronary bypass graft of native heart with other forms of angina pectoris ([place])   2. VENTRICULAR FIBRILLATION   3. Implantable cardioverter-defibrillator (ICD) in situ   4. Essential hypertension   5. Hyperlipidemia, unspecified hyperlipidemia type   6. Claudication ([place])   7. Carotid bruit, unspecified laterality   8. Right carotid bruit      PLAN:  In order of problems listed above:  CAD status post cardiac arrest x2 in the past treated with PCI to the left main followed by emergency CABG.  Follow-up cath showed all bypass grafts occluded.  Treated medically.  2D echo 2017 with normal LVEF.  Complains of angina about 3 times a month.  Usually in the morning.  Relieved with nitroglycerin.  Will try Ranexa 500 mg twice daily.  Her insurance would not pay for it in the past but now it is generic.  She is already on Imdur 120 mg daily.  She could also try taking Imdur 60 mg twice a day to see if this would help.  Follow-up with Dr. Irish Lack in 6 months or sooner if needed.  History of V. fib arrest treated with ICD followed by Dr. Caryl Comes and seen recently doing well.  Essential hypertension blood pressure controlled  Hyperlipidemia on low-dose Lipitor.  Last lipid panel a year ago.  Will need repeat lipids and LFTs.  Claudication symptoms with walking a half a mile.  Check lower extremity arterial Dopplers  Right carotid bruit check carotid Dopplers   Medication Adjustments/Labs and Tests Ordered: Current medicines are reviewed at length with the patient today.  Concerns regarding medicines are outlined above.   Medication changes, Labs and Tests ordered today are listed in  the Patient Instructions below. Patient Instructions  Medication Instructions:  Your physician has recommended you make the following change in your medication:   1. START: ranolazine (ranexa) 500 mg tablet: Take 1 tablet by mouth twice a day   If you need a refill on your cardiac medications before your next appointment, please call your pharmacy.   Lab work: Your physician recommends that you return for a FASTING lipid profile   If you have labs (blood work) drawn today and your tests are completely normal, you will receive your results only by: Marland Kitchen MyChart Message (if you have MyChart) OR . A paper copy in the mail If you have any lab test that is abnormal or we need to change your treatment, we will call you to review the results.  Testing/Procedures: Your physician has requested that you have a carotid duplex. This test is an ultrasound of the carotid arteries in your neck. It looks at blood flow through these arteries that supply the brain with blood. Allow one hour for this exam. There are no restrictions or special instructions.  Your physician has requested that you have a lower extremity arterial duplex. This test is an ultrasound of the arteries in the legs. It looks at arterial blood flow in the legs. Allow one hour for Lower Arterial scans. There are no restrictions or special instructions.   Follow-Up: At [facility], you and your health needs are our priority.  As part of our continuing mission to provide you with exceptional heart care, we have created designated Provider Care Teams.  These Care Teams include your primary Cardiologist (physician) and Advanced Practice Providers (APPs -  Physician Assistants and Nurse Practitioners) who all work together to provide you with the care you need, when you need it. . You will need a follow up appointment in 6 months.  Please call our office 2 months in advance to  schedule this appointment.  You may see Casandra Doffing, MD or one of the following Advanced Practice Providers on your designated Care Team:   . Lyda Jester, PA-C . Dayna Dunn, PA-C . Ermalinda Barrios, PA-C  Any Other Special Instructions Will Be Listed Below (If Applicable).       Sumner Boast, PA-C  02/03/2018 11:26 AM    [facility] [address], [place], [place]  [address] Phone: [phone]; Fax: [phone]

## 2018-02-03 ENCOUNTER — Encounter: Payer: Self-pay | Admitting: Physician Assistant

## 2018-02-03 ENCOUNTER — Ambulatory Visit (INDEPENDENT_AMBULATORY_CARE_PROVIDER_SITE_OTHER): Payer: Medicare Other | Admitting: Physician Assistant

## 2018-02-03 VITALS — BP 122/76 | HR 78 | Ht 63.0 in | Wt 168.8 lb

## 2018-02-03 DIAGNOSIS — E785 Hyperlipidemia, unspecified: Secondary | ICD-10-CM

## 2018-02-03 DIAGNOSIS — Z95810 Presence of automatic (implantable) cardiac defibrillator: Secondary | ICD-10-CM | POA: Diagnosis not present

## 2018-02-03 DIAGNOSIS — I10 Essential (primary) hypertension: Secondary | ICD-10-CM | POA: Diagnosis not present

## 2018-02-03 DIAGNOSIS — I4901 Ventricular fibrillation: Secondary | ICD-10-CM | POA: Diagnosis not present

## 2018-02-03 DIAGNOSIS — I25708 Atherosclerosis of coronary artery bypass graft(s), unspecified, with other forms of angina pectoris: Secondary | ICD-10-CM | POA: Diagnosis not present

## 2018-02-03 DIAGNOSIS — R0989 Other specified symptoms and signs involving the circulatory and respiratory systems: Secondary | ICD-10-CM

## 2018-02-03 DIAGNOSIS — I739 Peripheral vascular disease, unspecified: Secondary | ICD-10-CM | POA: Insufficient documentation

## 2018-02-03 MED ORDER — AMLODIPINE BESYLATE 10 MG PO TABS: 10.0000 mg | ORAL_TABLET | Freq: Every day | ORAL | 3 refills | Status: DC

## 2018-02-03 MED ORDER — NITROGLYCERIN 0.4 MG SL SUBL: 0.4000 mg | SUBLINGUAL_TABLET | SUBLINGUAL | 3 refills | Status: DC | PRN

## 2018-02-03 MED ORDER — RANOLAZINE ER 500 MG PO TB12: 500.0000 mg | ORAL_TABLET | Freq: Two times a day (BID) | ORAL | 3 refills | Status: DC

## 2018-02-03 MED ORDER — ISOSORBIDE MONONITRATE ER 120 MG PO TB24: 120.0000 mg | ORAL_TABLET | Freq: Every day | ORAL | 3 refills | Status: DC

## 2018-02-03 MED ORDER — FUROSEMIDE 20 MG PO TABS: ORAL_TABLET | ORAL | 3 refills | Status: DC

## 2018-02-03 MED ORDER — LISINOPRIL 10 MG PO TABS: 10.0000 mg | ORAL_TABLET | Freq: Every day | ORAL | 3 refills | Status: DC

## 2018-02-03 MED ORDER — METOPROLOL TARTRATE 25 MG PO TABS: ORAL_TABLET | ORAL | 3 refills | Status: DC

## 2018-02-03 MED ORDER — ATORVASTATIN CALCIUM 10 MG PO TABS: 10.0000 mg | ORAL_TABLET | Freq: Every day | ORAL | 3 refills | Status: DC

## 2018-02-03 MED ORDER — CLOPIDOGREL BISULFATE 75 MG PO TABS: 75.0000 mg | ORAL_TABLET | Freq: Every day | ORAL | 3 refills | Status: DC

## 2018-02-03 NOTE — Patient Instructions (Addendum)
Medication Instructions:  Your physician has recommended you make the following change in your medication:   1. START: ranolazine (ranexa) 500 mg tablet: Take 1 tablet by mouth twice a day   If you need a refill on your cardiac medications before your next appointment, please call your pharmacy.   Lab work: Your physician recommends that you return for a FASTING lipid profile   If you have labs (blood work) drawn today and your tests are completely normal, you will receive your results only by: Marland Kitchen MyChart Message (if you have MyChart) OR . A paper copy in the mail If you have any lab test that is abnormal or we need to change your treatment, we will call you to review the results.  Testing/Procedures: Your physician has requested that you have a carotid duplex. This test is an ultrasound of the carotid arteries in your neck. It looks at blood flow through these arteries that supply the brain with blood. Allow one hour for this exam. There are no restrictions or special instructions.  Your physician has requested that you have a lower extremity arterial duplex. This test is an ultrasound of the arteries in the legs. It looks at arterial blood flow in the legs. Allow one hour for Lower Arterial scans. There are no restrictions or special instructions.   Follow-Up: At [facility], you and your health needs are our priority.  As part of our continuing mission to provide you with exceptional heart care, we have created designated Provider Care Teams.  These Care Teams include your primary Cardiologist (physician) and Advanced Practice Providers (APPs -  Physician Assistants and Nurse Practitioners) who all work together to provide you with the care you need, when you need it. . You will need a follow up appointment in 6 months.  Please call our office 2 months in advance to schedule this appointment.  You may see Casandra Doffing, MD or one of the following Advanced Practice Providers on your  designated Care Team:   . Lyda Jester, PA-C . Dayna Dunn, PA-C . Ermalinda Barrios, PA-C  Any Other Special Instructions Will Be Listed Below (If Applicable).

## 2018-02-05 ENCOUNTER — Other Ambulatory Visit: Payer: Medicare Other | Admitting: *Deleted

## 2018-02-05 DIAGNOSIS — E785 Hyperlipidemia, unspecified: Secondary | ICD-10-CM

## 2018-02-06 LAB — LIPID PANEL
CHOL/HDL RATIO: 2.2 ratio (ref 0.0–4.4)
CHOLESTEROL TOTAL: 160 mg/dL (ref 100–199)
HDL: 74 mg/dL (ref >39)
LDL Calculated: 71 mg/dL (ref 0–99)
Triglycerides: 74 mg/dL (ref 0–149)
VLDL Cholesterol Cal: 15 mg/dL (ref 5–40)

## 2018-02-09 ENCOUNTER — Other Ambulatory Visit: Payer: Self-pay | Admitting: Physician Assistant

## 2018-02-09 DIAGNOSIS — I739 Peripheral vascular disease, unspecified: Secondary | ICD-10-CM

## 2018-02-12 ENCOUNTER — Ambulatory Visit (HOSPITAL_COMMUNITY)
Admission: RE | Admit: 2018-02-12 | Discharge: 2018-02-12 | Disposition: A | Discharge status: Home / Self Care | Payer: Medicare Other | Source: Ambulatory Visit | Attending: Cardiology | Admitting: Cardiology

## 2018-02-12 ENCOUNTER — Ambulatory Visit (HOSPITAL_BASED_OUTPATIENT_CLINIC_OR_DEPARTMENT_OTHER)
Admission: RE | Admit: 2018-02-12 | Discharge: 2018-02-12 | Disposition: A | Discharge status: Home / Self Care | Payer: Medicare Other | Source: Ambulatory Visit | Attending: Physician Assistant | Admitting: Physician Assistant

## 2018-02-12 DIAGNOSIS — R0989 Other specified symptoms and signs involving the circulatory and respiratory systems: Secondary | ICD-10-CM | POA: Insufficient documentation

## 2018-02-12 DIAGNOSIS — I739 Peripheral vascular disease, unspecified: Secondary | ICD-10-CM | POA: Insufficient documentation

## 2018-02-16 ENCOUNTER — Telehealth: Payer: Self-pay

## 2018-02-16 NOTE — Telephone Encounter (Signed)
-----   Message from Imogene Burn, PA-C sent at 02/16/2018 11:32 AM EDT ----- Carotid Doppler stable at 1 to 39%.  Repeat in 1 year.

## 2018-02-16 NOTE — Telephone Encounter (Signed)
Attempted to contact patient but there was no answer and VM did not pick up.  Will try again at another time.

## 2018-02-16 NOTE — Telephone Encounter (Signed)
-----   Message from Imogene Burn, PA-C sent at 02/16/2018 12:20 PM EDT ----- Lower extremity arterial Dopplers are normal

## 2018-02-17 NOTE — Telephone Encounter (Signed)
Attempted ton contact patient multiple times but the phone keeps ringing busy. Will try again at another time.

## 2018-02-20 NOTE — Telephone Encounter (Signed)
The patient has been notified of her carotid and LEA results and verbalized understanding.  All questions (if any) were answered. Cleon Gustin, RN 02/20/2018 5:31 PM

## 2018-03-23 ENCOUNTER — Other Ambulatory Visit: Payer: Self-pay | Admitting: Internal Medicine

## 2018-03-23 DIAGNOSIS — Z1231 Encounter for screening mammogram for malignant neoplasm of breast: Secondary | ICD-10-CM

## 2018-04-16 ENCOUNTER — Telehealth: Payer: Self-pay

## 2018-04-16 NOTE — Telephone Encounter (Signed)
Spoke with pt and reminded pt of remote transmission that is due today. Pt verbalized understanding.   

## 2018-04-17 ENCOUNTER — Encounter: Payer: Self-pay | Admitting: Cardiology

## 2018-05-05 ENCOUNTER — Ambulatory Visit: Payer: Medicare Other

## 2018-05-14 ENCOUNTER — Ambulatory Visit: Payer: Medicare Other

## 2018-06-11 ENCOUNTER — Ambulatory Visit
Admission: RE | Admit: 2018-06-11 | Discharge: 2018-06-11 | Disposition: A | Discharge status: Home / Self Care | Payer: Medicare Other | Source: Ambulatory Visit | Attending: Internal Medicine | Admitting: Internal Medicine

## 2018-06-11 DIAGNOSIS — Z1231 Encounter for screening mammogram for malignant neoplasm of breast: Secondary | ICD-10-CM

## 2018-08-10 ENCOUNTER — Telehealth: Payer: Self-pay

## 2018-08-10 NOTE — Telephone Encounter (Signed)
Called pt to set up evisit. Unable to leave voicemail.

## 2018-08-10 NOTE — Telephone Encounter (Signed)
Called pt to set up possible evisit. No answer and unable to leave voicemail.

## 2018-08-11 NOTE — Telephone Encounter (Addendum)
Attempted to contact patient regarding her appointment on Thursday but the patient did not answer and VM did not pick up. Attempted to contact patient's son and daughter and I was not able to reach them or leave a VM. Will try again at another time.

## 2018-08-12 NOTE — Telephone Encounter (Signed)
Left message for patient to call back regarding her appointment tomorrow.

## 2018-08-12 NOTE — Telephone Encounter (Signed)
Virtual Visit Pre-Appointment Phone Call  TELEPHONE CALL NOTE  Karen [name] has been deemed a candidate for a follow-up tele-health visit to limit community exposure during the Covid-19 pandemic. I spoke with the patient via phone to ensure availability of phone/video source, confirm preferred email & phone number, and discuss instructions and expectations.  I reminded Karen [name] to be prepared with any vital sign and/or heart rhythm information that could potentially be obtained via home monitoring, at the time of her visit. I reminded Karen [name] to expect a phone call at the time of her visit if her visit.  Patient agrees to consent below.  Cleon Gustin, RN 08/12/2018 9:26 AM    FULL LENGTH CONSENT FOR TELE-HEALTH VISIT   I hereby voluntarily request, consent and authorize [facility] and its employed or contracted physicians, physician assistants, nurse practitioners or other licensed health care professionals (the Practitioner), to provide me with telemedicine health care services (the "Services") as deemed necessary by the treating Practitioner. I acknowledge and consent to receive the Services by the Practitioner via telemedicine. I understand that the telemedicine visit will involve communicating with the Practitioner through live audiovisual communication technology and the disclosure of certain medical information by electronic transmission. I acknowledge that I have been given the opportunity to request an in-person assessment or other available alternative prior to the telemedicine visit and am voluntarily participating in the telemedicine visit.  I understand that I have the right to withhold or withdraw my consent to the use of telemedicine in the course of my care at any time, without affecting my right to future care or treatment, and that the Practitioner or I may terminate the telemedicine visit at any time. I understand that I have the right to inspect  all information obtained and/or recorded in the course of the telemedicine visit and may receive copies of available information for a reasonable fee.  I understand that some of the potential risks of receiving the Services via telemedicine include:  Marland Kitchen Delay or interruption in medical evaluation due to technological equipment failure or disruption; . Information transmitted may not be sufficient (e.g. poor resolution of images) to allow for appropriate medical decision making by the Practitioner; and/or  . In rare instances, security protocols could fail, causing a breach of personal health information.  Furthermore, I acknowledge that it is my responsibility to provide information about my medical history, conditions and care that is complete and accurate to the best of my ability. I acknowledge that Practitioner's advice, recommendations, and/or decision may be based on factors not within their control, such as incomplete or inaccurate data provided by me or distortions of diagnostic images or specimens that may result from electronic transmissions. I understand that the practice of medicine is not an exact science and that Practitioner makes no warranties or guarantees regarding treatment outcomes. I acknowledge that I will receive a copy of this consent concurrently upon execution via email to the email address I last provided but may also request a printed copy by calling the office of [facility].    I understand that my insurance will be billed for this visit.   I have read or had this consent read to me. . I understand the contents of this consent, which adequately explains the benefits and risks of the Services being provided via telemedicine.  . I have been provided ample opportunity to ask questions regarding this consent and the Services and have had my questions  answered to my satisfaction. . I give my informed consent for the services to be provided through the use of telemedicine in my  medical care  By participating in this telemedicine visit I agree to the above.

## 2018-08-12 NOTE — Progress Notes (Signed)
Virtual Visit via Video Note   This visit type was conducted due to national recommendations for restrictions regarding the COVID-19 Pandemic (e.g. social distancing) in an effort to limit this patient's exposure and mitigate transmission in our community.  Due to her co-morbid illnesses, this patient is at least at moderate risk for complications without adequate follow up.  This format is felt to be most appropriate for this patient at this time.  All issues noted in this document were discussed and addressed.  A limited physical exam was performed with this format.  Please refer to the patient's chart for her consent to telehealth for [facility].   Evaluation Performed:  Follow-up visit  Date:  08/13/2018   ID:  Karen [name], DOB 19-Dec-1945, MRN [id number]  Patient Location: Home Provider Location: Home  PCP:  Nolene Ebbs, MD  Cardiologist:  Larae Grooms, MD  Electrophysiologist:  Virl Axe, MD   Chief Complaint:  CAD  History of Present Illness:    Karen [name] is a 73 y.o. female who has had a complicated cardiac history. She had a cardiac arrest in the early 08-02-1998. At that time, she had an AICD placed in [place] by Dr. Chuck Hint. In 08/01/05, she had another cardiac arrest at the time of hospitalization. Cardiac cath showed an occluded left main which was angioplastied. She then went to emergency CABG. she recovered from that episode.   Since that time, she has had chronic chest discomfort. His lead to a repeat cardiac cath in 08-02-06. It showed that her grafts were closed. Her native left main had opened. She did have some other branch vessel disease. She has been managed medically. SHe has had some chest pain since stopping the Ranexa which was no longer covered by insurance, but improved with Imdur. Last stress test was in July 2015 and showed no evidence of ischemia with normal LV function.  Husband passed away in 08/01/2017.  The patient does not have symptoms  concerning for COVID-19 infection (fever, chills, cough, or new shortness of breath).   Denies : Chest pain. Dizziness. Leg edema. Nitroglycerin use. Orthopnea. Palpitations. Paroxysmal nocturnal dyspnea. Shortness of breath. Syncope.   Staying in the house.  Avoiding going outside.  No bleeding problems.    Past Medical History:  Diagnosis Date  . Acute myocardial infarction, unspecified site, episode of care unspecified   . Allergic rhinitis, cause unspecified   . CAD (coronary artery disease)   . GERD (gastroesophageal reflux disease)   . Headache(784.0)   . HTN (hypertension)   . Other and unspecified hyperlipidemia   . Other diseases of lung, not elsewhere classified   . Postsurgical aortocoronary bypass status    hx of it.    Past Surgical History:  Procedure Laterality Date  . CARDIAC DEFIBRILLATOR PLACEMENT  03/2006   [employer]. remote-yes.   . CORONARY ARTERY BYPASS GRAFT     x4  . IMPLANTABLE CARDIOVERTER DEFIBRILLATOR GENERATOR CHANGE N/A 03/03/2013   Procedure: IMPLANTABLE CARDIOVERTER DEFIBRILLATOR GENERATOR CHANGE;  Surgeon: Deboraha Sprang, MD;  [facility];  Service: Cardiovascular;  Laterality: N/A;  . stent implant     x1. possibly x2.      Current Meds  Medication Sig  . acetaminophen (TYLENOL) 325 MG tablet Take 2 tablets (650 mg total) by mouth every 6 (six) hours as needed.  Marland Kitchen amLODipine (NORVASC) 10 MG tablet Take 1 tablet (10 mg total) by mouth daily.  Marland Kitchen aspirin EC 81 MG tablet Take 81  mg by mouth daily.  Marland Kitchen atorvastatin (LIPITOR) 10 MG tablet Take 1 tablet (10 mg total) by mouth daily.  . clopidogrel (PLAVIX) 75 MG tablet Take 1 tablet (75 mg total) by mouth daily.  . furosemide (LASIX) 20 MG tablet TAKE 1 TABLET(20 MG) BY MOUTH DAILY  . isosorbide mononitrate (IMDUR) 120 MG 24 hr tablet Take 1 tablet (120 mg total) by mouth daily.  Marland Kitchen lisinopril (PRINIVIL,ZESTRIL) 10 MG tablet Take 1 tablet (10 mg total) by mouth daily.  . metoprolol tartrate  (LOPRESSOR) 25 MG tablet take 1 tablet by mouth twice a day  . nitroGLYCERIN (NITROSTAT) 0.4 MG SL tablet Place 1 tablet (0.4 mg total) under the tongue every 5 (five) minutes as needed for chest pain (X3 DOSES MAX).  Marland Kitchen ranolazine (RANEXA) 500 MG 12 hr tablet Take 1 tablet (500 mg total) by mouth 2 (two) times daily.     Allergies:   Codeine and Percocet [oxycodone-acetaminophen]   Social History   Tobacco Use  . Smoking status: Former Research scientist (life sciences)  . Smokeless tobacco: Never Used  Substance Use Topics  . Alcohol use: No  . Drug use: No     Family Hx: The patient's family history includes Breast cancer in her maternal aunt; Heart attack in her mother; Heart disease in her mother.  ROS:   Please see the history of present illness.    Back pain All other systems reviewed and are negative.   Prior CV studies:   The following studies were reviewed today:  10/19: Right: Resting right ankle-brachial index is within normal range. No evidence of significant right lower extremity arterial disease. The right toe-brachial index is mildly abnormal.  Left: Resting left ankle-brachial index is within normal range. No evidence of significant left lower extremity arterial disease. The left toe-brachial index is normal.  Labs/Other Tests and Data Reviewed:    EKG:  NSR, inferior Q waves in 9/19  Recent Labs: 01/15/2018: ALT 13; BUN 13; Creatinine, Ser 1.21; Hemoglobin 12.8; Platelets 225; Potassium 4.0; Sodium 141   Recent Lipid Panel Lab Results  Component Value Date/Time   CHOL 160 02/05/2018 01:14 PM   TRIG 74 02/05/2018 01:14 PM   HDL 74 02/05/2018 01:14 PM   CHOLHDL 2.2 02/05/2018 01:14 PM   CHOLHDL 3 12/27/2014 11:55 AM   LDLCALC 71 02/05/2018 01:14 PM   LDLDIRECT 82 01/15/2018 03:36 PM    Wt Readings from Last 3 Encounters:  08/13/18 168 lb (76.2 kg)  02/03/18 168 lb 12.8 oz (76.6 kg)  01/15/18 164 lb 6.4 oz (74.6 kg)     Objective:    Vital Signs:  BP 133/77   Pulse 94    Ht 5\' 3"  (1.6 m)   Wt 168 lb (76.2 kg)   BMI 29.76 kg/m    Well nourished, well developed female in no acute distress. No shortness of breath  ASSESSMENT & PLAN:    1. CAD: No angina.  Continue aggressive secondary prevention.  SOme easy bruising.  Stop aspirin.  Continue clopidogrel.   2. AICD: Followed by EP.  3. H/o V-Fib arrest: s/p AICD.  She is trying to get remote checks set up.  Not currently working.  4. Hyperlipidemia: Lipids well controlled in 10/19. 5. 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    COVID-19 Education: The signs and symptoms of COVID-19 were discussed with the patient and how to seek care for testing (follow up with PCP or arrange E-visit).  The importance of social distancing was  discussed today.  Time:   Today, I have spent 25 minutes with the patient with telehealth technology discussing the above problems.     Medication Adjustments/Labs and Tests Ordered: Current medicines are reviewed at length with the patient today.  Concerns regarding medicines are outlined above.   Tests Ordered: No orders of the defined types were placed in this encounter.   Medication Changes: No orders of the defined types were placed in this encounter.   Disposition:  Follow up in 1 year(s)  Signed, Larae Grooms, MD  08/13/2018 1:51 PM    [place]

## 2018-08-13 ENCOUNTER — Encounter: Payer: Self-pay | Admitting: Interventional Cardiology

## 2018-08-13 ENCOUNTER — Telehealth (INDEPENDENT_AMBULATORY_CARE_PROVIDER_SITE_OTHER): Payer: Medicare Other | Admitting: Interventional Cardiology

## 2018-08-13 ENCOUNTER — Telehealth: Payer: Self-pay | Admitting: *Deleted

## 2018-08-13 ENCOUNTER — Other Ambulatory Visit: Payer: Self-pay

## 2018-08-13 VITALS — BP 133/77 | HR 94 | Ht 63.0 in | Wt 168.0 lb

## 2018-08-13 DIAGNOSIS — I252 Old myocardial infarction: Secondary | ICD-10-CM | POA: Diagnosis not present

## 2018-08-13 DIAGNOSIS — E785 Hyperlipidemia, unspecified: Secondary | ICD-10-CM

## 2018-08-13 DIAGNOSIS — I4901 Ventricular fibrillation: Secondary | ICD-10-CM

## 2018-08-13 DIAGNOSIS — I25708 Atherosclerosis of coronary artery bypass graft(s), unspecified, with other forms of angina pectoris: Secondary | ICD-10-CM | POA: Diagnosis not present

## 2018-08-13 DIAGNOSIS — Z95810 Presence of automatic (implantable) cardiac defibrillator: Secondary | ICD-10-CM

## 2018-08-13 MED ORDER — LISINOPRIL 10 MG PO TABS: 10.0000 mg | ORAL_TABLET | Freq: Every day | ORAL | 3 refills | Status: DC

## 2018-08-13 MED ORDER — ATORVASTATIN CALCIUM 10 MG PO TABS: 10.0000 mg | ORAL_TABLET | Freq: Every day | ORAL | 3 refills | Status: DC

## 2018-08-13 MED ORDER — ISOSORBIDE MONONITRATE ER 120 MG PO TB24: 120.0000 mg | ORAL_TABLET | Freq: Every day | ORAL | 3 refills | Status: DC

## 2018-08-13 MED ORDER — CLOPIDOGREL BISULFATE 75 MG PO TABS: 75.0000 mg | ORAL_TABLET | Freq: Every day | ORAL | 3 refills | Status: DC

## 2018-08-13 MED ORDER — AMLODIPINE BESYLATE 10 MG PO TABS: 10.0000 mg | ORAL_TABLET | Freq: Every day | ORAL | 3 refills | Status: DC

## 2018-08-13 MED ORDER — METOPROLOL TARTRATE 25 MG PO TABS: ORAL_TABLET | ORAL | 3 refills | Status: DC

## 2018-08-13 MED ORDER — NITROGLYCERIN 0.4 MG SL SUBL: 0.4000 mg | SUBLINGUAL_TABLET | SUBLINGUAL | 3 refills | Status: DC | PRN

## 2018-08-13 NOTE — Telephone Encounter (Signed)
-----   Message from Cleon Gustin, RN sent at 08/13/2018  2:07 PM EDT ----- Regarding: Remote checks Patient had a Virtual Visit with JV today and stated that she needed help with getting set up for remote checks. Can you help her if she is not already set up?   Thanks

## 2018-08-13 NOTE — Telephone Encounter (Signed)
Attempted to reach patient. No answer, no option to LM.   Pt's monitor has not connected since 06/2015. Per Heron Sabins, [employer] rep, monitor software is too old to update and she will need a new monitor. He can get one to her next week if we are able to reach pt to confirm address/phone number. Please also confirm if pt will need cell adapter.

## 2018-08-13 NOTE — Patient Instructions (Signed)
Medication Instructions:  Your physician has recommended you make the following change in your medication:   1. STOP: aspirin  2. CONTINUE: clopidogrel (plavix)  Refills have been sent in for all of your cardiac meds   Lab work: None Ordered  If you have labs (blood work) drawn today and your tests are completely normal, you will receive your results only by: Marland Kitchen MyChart Message (if you have MyChart) OR . A paper copy in the mail If you have any lab test that is abnormal or we need to change your treatment, we will call you to review the results.  Testing/Procedures: None ordered  Follow-Up: At [facility], you and your health needs are our priority.  As part of our continuing mission to provide you with exceptional heart care, we have created designated Provider Care Teams.  These Care Teams include your primary Cardiologist (physician) and Advanced Practice Providers (APPs -  Physician Assistants and Nurse Practitioners) who all work together to provide you with the care you need, when you need it. . You will need a follow up appointment in 1 year.  Please call our office 2 months in advance to schedule this appointment.  You may see Casandra Doffing, MD or one of the following Advanced Practice Providers on your designated Care Team:   . Lyda Jester, PA-C . Dayna Dunn, PA-C . Ermalinda Barrios, PA-C  Any Other Special Instructions Will Be Listed Below (If Applicable).

## 2018-08-14 NOTE — Telephone Encounter (Signed)
-----   Message from Cleon Gustin, RN sent at 08/13/2018  2:07 PM EDT ----- Regarding: Remote checks Patient had a Virtual Visit with JV today and stated that she needed help with getting set up for remote checks. Can you help her if she is not already set up?   Thanks

## 2018-08-14 NOTE — Telephone Encounter (Signed)
Spoke w/ pt and she stated that she has tried setting monitor up in the past and has even called tech support and they have been unable to get the monitor working. Pt stated that she has the monitor hooked up w/ her land line phone. Ordered a cell adapter. Informed pt she will receive this in 7-14 business day's. Once it is received she is going to call back. Sent a staff message to myself as a reminder to call pt back on 5/4 if she hasn't called back.

## 2018-08-28 ENCOUNTER — Telehealth: Payer: Self-pay | Admitting: Cardiology

## 2018-08-28 NOTE — Telephone Encounter (Signed)
Called pt and she stated that she has received her new monitor but has been unsuccessful at setting it up. E-mail sent to [employer] rep to call pt and help w/ monitor set up. Pt aware to be expecting his call.

## 2018-08-28 NOTE — Telephone Encounter (Signed)
-----   Message from Tiajuana Amass, [place] sent at 08/14/2018  9:02 AM EDT ----- Regarding: call pt on 5/4 to help w/ home monitor Call pt back on 5/4 to help w/ monitor

## 2018-08-28 NOTE — Telephone Encounter (Signed)
[employer] rep will be taking a new monitor to pt on Monday.

## 2018-08-31 ENCOUNTER — Ambulatory Visit (INDEPENDENT_AMBULATORY_CARE_PROVIDER_SITE_OTHER): Payer: Medicare Other | Admitting: *Deleted

## 2018-08-31 DIAGNOSIS — I4901 Ventricular fibrillation: Secondary | ICD-10-CM

## 2018-08-31 NOTE — Telephone Encounter (Signed)
Transmission received and added to the remote schedule.

## 2018-09-01 LAB — CUP PACEART REMOTE DEVICE CHECK
Battery Remaining Longevity: 102 mo
Battery Remaining Percentage: 100 %
Brady Statistic RV Percent Paced: 0 %
Date Time Interrogation Session: 20200504182900
HighPow Impedance: 67 Ohm
Implantable Lead Implant Date: 20071205
Implantable Lead Location: 753860
Implantable Lead Model: 137
Implantable Lead Serial Number: 102363
Implantable Pulse Generator Implant Date: 20141105
Lead Channel Impedance Value: 437 Ohm
Lead Channel Pacing Threshold Amplitude: 0.7 V
Lead Channel Pacing Threshold Pulse Width: 0.5 ms
Lead Channel Setting Pacing Amplitude: 2.4 V
Lead Channel Setting Pacing Pulse Width: 0.5 ms
Lead Channel Setting Sensing Sensitivity: 0.6 mV
Pulse Gen Serial Number: 126146

## 2018-09-07 ENCOUNTER — Encounter: Payer: Self-pay | Admitting: Cardiology

## 2018-09-07 NOTE — Progress Notes (Signed)
Remote ICD transmission.   

## 2018-11-30 ENCOUNTER — Ambulatory Visit (INDEPENDENT_AMBULATORY_CARE_PROVIDER_SITE_OTHER): Payer: Medicare Other | Admitting: *Deleted

## 2018-11-30 DIAGNOSIS — I4901 Ventricular fibrillation: Secondary | ICD-10-CM | POA: Diagnosis not present

## 2018-12-01 LAB — CUP PACEART REMOTE DEVICE CHECK
Battery Remaining Longevity: 102 mo
Battery Remaining Percentage: 100 %
Brady Statistic RV Percent Paced: 0 %
Date Time Interrogation Session: 20200804065835
HighPow Impedance: 72 Ohm
Implantable Lead Implant Date: 20071205
Implantable Lead Location: 753860
Implantable Lead Model: 137
Implantable Lead Serial Number: 102363
Implantable Pulse Generator Implant Date: 20141105
Lead Channel Impedance Value: 418 Ohm
Lead Channel Pacing Threshold Amplitude: 0.7 V
Lead Channel Pacing Threshold Pulse Width: 0.5 ms
Lead Channel Setting Pacing Amplitude: 2.4 V
Lead Channel Setting Pacing Pulse Width: 0.5 ms
Lead Channel Setting Sensing Sensitivity: 0.6 mV
Pulse Gen Serial Number: 126146

## 2018-12-09 ENCOUNTER — Encounter: Payer: Self-pay | Admitting: Cardiology

## 2018-12-09 NOTE — Progress Notes (Signed)
Remote ICD transmission.   

## 2019-01-14 NOTE — Progress Notes (Signed)
Electrophysiology Office Note Date: 01/15/2019  ID:  Karen [name], DOB 1946-01-26, MRN [id number]  PCP: Nolene Ebbs, MD Primary Cardiologist: Beau Fanny Electrophysiologist: Caryl Comes  CC: Routine ICD follow-up  Karen [name] is a 73 y.o. female seen today for Dr Caryl Comes.  She presents today for routine electrophysiology followup.  Since last being seen in our clinic, the patient reports doing very well. She denies chest pain, palpitations, dyspnea, PND, orthopnea, nausea, vomiting, dizziness, syncope, edema, weight gain, or early satiety.  She has not had ICD shocks. She does have nocturnal leg cramps    Past Medical History:  Diagnosis Date  . Acute myocardial infarction, unspecified site, episode of care unspecified   . Allergic rhinitis, cause unspecified   . CAD (coronary artery disease)   . GERD (gastroesophageal reflux disease)   . Headache(784.0)   . HTN (hypertension)   . Other and unspecified hyperlipidemia   . Other diseases of lung, not elsewhere classified   . Postsurgical aortocoronary bypass status    hx of it.    Past Surgical History:  Procedure Laterality Date  . CARDIAC DEFIBRILLATOR PLACEMENT  03/2006   Guidant. remote-yes.   . CORONARY ARTERY BYPASS GRAFT     x4  . IMPLANTABLE CARDIOVERTER DEFIBRILLATOR GENERATOR CHANGE N/A 03/03/2013   Procedure: IMPLANTABLE CARDIOVERTER DEFIBRILLATOR GENERATOR CHANGE;  Surgeon: Deboraha Sprang, MD;  Location: [facility] CATH LAB;  Service: Cardiovascular;  Laterality: N/A;  . stent implant     x1. possibly x2.     Current Outpatient Medications  Medication Sig Dispense Refill  . acetaminophen (TYLENOL) 325 MG tablet Take 2 tablets (650 mg total) by mouth every 6 (six) hours as needed. 60 tablet 0  . amLODipine (NORVASC) 10 MG tablet Take 1 tablet (10 mg total) by mouth daily. 90 tablet 3  . atorvastatin (LIPITOR) 10 MG tablet Take 1 tablet (10 mg total) by mouth daily. 90 tablet 3  . clopidogrel (PLAVIX) 75 MG tablet Take  1 tablet (75 mg total) by mouth daily. 90 tablet 3  . furosemide (LASIX) 20 MG tablet TAKE 1 TABLET(20 MG) BY MOUTH DAILY 90 tablet 3  . isosorbide mononitrate (IMDUR) 120 MG 24 hr tablet Take 1 tablet (120 mg total) by mouth daily. 90 tablet 3  . lisinopril (ZESTRIL) 10 MG tablet Take 1 tablet (10 mg total) by mouth daily. 90 tablet 3  . metoprolol tartrate (LOPRESSOR) 25 MG tablet take 1 tablet by mouth twice a day 180 tablet 3  . nitroGLYCERIN (NITROSTAT) 0.4 MG SL tablet Place 1 tablet (0.4 mg total) under the tongue every 5 (five) minutes as needed for chest pain (X3 DOSES MAX). 25 tablet 3  . ranolazine (RANEXA) 500 MG 12 hr tablet Take 1 tablet (500 mg total) by mouth 2 (two) times daily. 180 tablet 3   No current facility-administered medications for this visit.     Allergies:   Codeine and Percocet [oxycodone-acetaminophen]   Social History: Social History   Socioeconomic History  . Marital status: Legally Separated    Spouse name: Not on file  . Number of children: Not on file  . Years of education: Not on file  . Highest education level: Not on file  Occupational History  . Not on file  Social Needs  . Financial resource strain: Not on file  . Food insecurity    Worry: Not on file    Inability: Not on file  . Transportation needs    Medical: Not on file  Non-medical: Not on file  Tobacco Use  . Smoking status: Former Research scientist (life sciences)  . Smokeless tobacco: Never Used  Substance and Sexual Activity  . Alcohol use: No  . Drug use: No  . Sexual activity: Not on file  Lifestyle  . Physical activity    Days per week: Not on file    Minutes per session: Not on file  . Stress: Not on file  Relationships  . Social Herbalist on phone: Not on file    Gets together: Not on file    Attends religious service: Not on file    Active member of club or organization: Not on file    Attends meetings of clubs or organizations: Not on file    Relationship status: Not on  file  . Intimate partner violence    Fear of current or ex partner: Not on file    Emotionally abused: Not on file    Physically abused: Not on file    Forced sexual activity: Not on file  Other Topics Concern  . Not on file  Social History Narrative   Married, 4 children.     Family History: Family History  Problem Relation Age of Onset  . Heart attack Mother   . Heart disease Mother   . Breast cancer Maternal Aunt     Review of Systems: All other systems reviewed and are otherwise negative except as noted above.   Physical Exam: VS:  BP (!) 112/58   Pulse 77   Ht 5\' 3"  (1.6 m)   Wt 168 lb (76.2 kg)   SpO2 99%   BMI 29.76 kg/m  , BMI Body mass index is 29.76 kg/m.  GEN- The patient is well appearing, alert and oriented x 3 today.   HEENT: normocephalic, atraumatic; sclera clear, conjunctiva pink; hearing intact; oropharynx clear; neck supple  Lungs- Clear to ausculation bilaterally, normal work of breathing.  No wheezes, rales, rhonchi Heart- Regular rate and rhythm  GI- soft, non-tender, non-distended, bowel sounds present  Extremities- no clubbing, cyanosis, or edema  MS- no significant deformity or atrophy Skin- warm and dry, no rash or lesion; ICD pocket well healed Psych- euthymic mood, full affect Neuro- strength and sensation are intact  ICD interrogation- reviewed in detail today,  See PACEART report  EKG:  EKG is not ordered today.  Recent Labs: 01/15/2018: ALT 13; BUN 13; Creatinine, Ser 1.21; Hemoglobin 12.8; Platelets 225; Potassium 4.0; Sodium 141   Wt Readings from Last 3 Encounters:  01/15/19 168 lb (76.2 kg)  08/13/18 168 lb (76.2 kg)  02/03/18 168 lb 12.8 oz (76.6 kg)     Other studies Reviewed: Additional studies/ records that were reviewed today include: Dr Caryl Comes and Dr Irish Lack office notes   Assessment and Plan:  1.  VF arrest No arrhythmia recurrence Normal ICD function See Pace Art report No changes today  2.  CAD No recent  ischemic symptoms  Continue medical therapy  3.  Leg cramps BMET, Mg today   Current medicines are reviewed at length with the patient today.   The patient does not have concerns regarding her medicines.  The following changes were made today:  none  Labs/ tests ordered today include  Orders Placed This Encounter  Procedures  . Basic Metabolic Panel (BMET)  . Magnesium     Disposition:   Follow up with [facility], Dr Caryl Comes 1 year     Signed, Chanetta Marshall, NP 01/15/2019 10:19 AM  [facility]  [address] [place] [address] [phone] (office) [phone] (fax)

## 2019-01-15 ENCOUNTER — Ambulatory Visit (INDEPENDENT_AMBULATORY_CARE_PROVIDER_SITE_OTHER): Payer: Medicare Other | Admitting: Nurse Practitioner

## 2019-01-15 ENCOUNTER — Other Ambulatory Visit: Payer: Self-pay

## 2019-01-15 VITALS — BP 112/58 | HR 77 | Ht 63.0 in | Wt 168.0 lb

## 2019-01-15 DIAGNOSIS — I4901 Ventricular fibrillation: Secondary | ICD-10-CM

## 2019-01-15 DIAGNOSIS — I25708 Atherosclerosis of coronary artery bypass graft(s), unspecified, with other forms of angina pectoris: Secondary | ICD-10-CM

## 2019-01-15 LAB — BASIC METABOLIC PANEL
BUN/Creatinine Ratio: 8 — ABNORMAL LOW (ref 12–28)
BUN: 10 mg/dL (ref 8–27)
CO2: 24 mmol/L (ref 20–29)
Calcium: 9.7 mg/dL (ref 8.7–10.3)
Chloride: 102 mmol/L (ref 96–106)
Creatinine, Ser: 1.3 mg/dL — ABNORMAL HIGH (ref 0.57–1.00)
GFR calc Af Amer: 47 mL/min/{1.73_m2} — ABNORMAL LOW (ref >59)
GFR calc non Af Amer: 41 mL/min/{1.73_m2} — ABNORMAL LOW (ref >59)
Glucose: 117 mg/dL — ABNORMAL HIGH (ref 65–99)
Potassium: 4 mmol/L (ref 3.5–5.2)
Sodium: 140 mmol/L (ref 134–144)

## 2019-01-15 LAB — CUP PACEART INCLINIC DEVICE CHECK
Date Time Interrogation Session: 20200918103731
Implantable Lead Implant Date: 20071205
Implantable Lead Location: 753860
Implantable Lead Model: 137
Implantable Lead Serial Number: 102363
Implantable Pulse Generator Implant Date: 20141105
Pulse Gen Serial Number: 126146

## 2019-01-15 LAB — MAGNESIUM: Magnesium: 1.9 mg/dL (ref 1.6–2.3)

## 2019-01-15 NOTE — Patient Instructions (Addendum)
Medication Instructions:  none If you need a refill on your cardiac medications before your next appointment, please call your pharmacy.   Lab work:TODAY Magnesium bmet If you have labs (blood work) drawn today and your tests are completely normal, you will receive your results only by: Marland Kitchen MyChart Message (if you have MyChart) OR . A paper copy in the mail If you have any lab test that is abnormal or we need to change your treatment, we will call you to review the results.  Testing/Procedures: none  Follow-Up: 1 Year with Dr Caryl Comes At [facility], you and your health needs are our priority.  As part of our continuing mission to provide you with exceptional heart care, we have created designated Provider Care Teams.  These Care Teams include your primary Cardiologist (physician) and Advanced Practice Providers (APPs -  Physician Assistants and Nurse Practitioners) who all work together to provide you with the care you need, when you need it. .   Any Other Special Instructions Will Be Listed Below (If Applicable). Remote monitoring is used to monitor your  ICD from home. This monitoring reduces the number of office visits required to check your device to one time per year. It allows [place] to keep an eye on the functioning of your device to ensure it is working properly. You are scheduled for a device check from home on 03/01/19. You may send your transmission at any time that day. If you have a wireless device, the transmission will be sent automatically. After your physician reviews your transmission, you will receive a postcard with your next transmission date.

## 2019-01-19 ENCOUNTER — Telehealth: Payer: Self-pay

## 2019-01-19 NOTE — Telephone Encounter (Signed)
-----   Message from Patsey Berthold, NP sent at 01/19/2019  8:43 AM EDT ----- Please notify patient of stable labs. Thanks!

## 2019-01-19 NOTE — Telephone Encounter (Signed)
Notes recorded by Frederik Schmidt, RN on 01/19/2019 at 9:29 AM EDT  The patient has been notified of the result and verbalized understanding. All questions (if any) were answered.  Frederik Schmidt, RN 01/19/2019 9:29 AM

## 2019-02-02 ENCOUNTER — Other Ambulatory Visit: Payer: Self-pay | Admitting: Physician Assistant

## 2019-02-03 ENCOUNTER — Other Ambulatory Visit: Payer: Self-pay | Admitting: Physician Assistant

## 2019-02-26 ENCOUNTER — Other Ambulatory Visit: Payer: Self-pay

## 2019-02-26 DIAGNOSIS — Z20822 Contact with and (suspected) exposure to covid-19: Secondary | ICD-10-CM

## 2019-02-27 LAB — NOVEL CORONAVIRUS, NAA: SARS-CoV-2, NAA: NOT DETECTED

## 2019-03-01 ENCOUNTER — Ambulatory Visit (INDEPENDENT_AMBULATORY_CARE_PROVIDER_SITE_OTHER): Payer: Medicare Other | Admitting: *Deleted

## 2019-03-01 DIAGNOSIS — I4901 Ventricular fibrillation: Secondary | ICD-10-CM

## 2019-03-01 LAB — CUP PACEART REMOTE DEVICE CHECK
Battery Remaining Longevity: 96 mo
Battery Remaining Percentage: 100 %
Brady Statistic RV Percent Paced: 0 %
Date Time Interrogation Session: 20201102081734
HighPow Impedance: 75 Ohm
Implantable Lead Implant Date: 20071205
Implantable Lead Location: 753860
Implantable Lead Model: 137
Implantable Lead Serial Number: 102363
Implantable Pulse Generator Implant Date: 20141105
Lead Channel Impedance Value: 424 Ohm
Lead Channel Pacing Threshold Amplitude: 0.8 V
Lead Channel Pacing Threshold Pulse Width: 0.5 ms
Lead Channel Setting Pacing Amplitude: 2.4 V
Lead Channel Setting Pacing Pulse Width: 0.5 ms
Lead Channel Setting Sensing Sensitivity: 0.6 mV
Pulse Gen Serial Number: 126146

## 2019-03-24 NOTE — Progress Notes (Signed)
Remote ICD transmission.   

## 2019-04-26 ENCOUNTER — Other Ambulatory Visit: Payer: Self-pay | Admitting: Orthopedic Surgery

## 2019-04-26 DIAGNOSIS — M545 Low back pain, unspecified: Secondary | ICD-10-CM

## 2019-05-04 ENCOUNTER — Ambulatory Visit
Admission: RE | Admit: 2019-05-04 | Discharge: 2019-05-04 | Disposition: A | Discharge status: Home / Self Care | Payer: Medicare Other | Source: Ambulatory Visit | Attending: Orthopedic Surgery | Admitting: Orthopedic Surgery

## 2019-05-04 ENCOUNTER — Other Ambulatory Visit: Payer: Medicare Other

## 2019-05-04 DIAGNOSIS — M545 Low back pain, unspecified: Secondary | ICD-10-CM

## 2019-05-12 ENCOUNTER — Telehealth: Payer: Self-pay | Admitting: Interventional Cardiology

## 2019-05-12 NOTE — Telephone Encounter (Signed)
I will send this call to both Gen Card Dr. Hassell Done nurse as well as to Dr. Olin Pia nurse as not sure what the test results are for.

## 2019-05-12 NOTE — Telephone Encounter (Signed)
I spoke to the patient and she will have Dr Rolena Infante' office fax report to Dr Irish Lack to review.  She verbalized understanding.

## 2019-05-12 NOTE — Telephone Encounter (Signed)
New Message     Pt is calling and says she had a test done at another Dr and they gave her the results and she is needing to give it  To her cardiologist    Please call back

## 2019-05-31 ENCOUNTER — Ambulatory Visit (INDEPENDENT_AMBULATORY_CARE_PROVIDER_SITE_OTHER): Payer: Medicare Other | Admitting: *Deleted

## 2019-05-31 DIAGNOSIS — I4901 Ventricular fibrillation: Secondary | ICD-10-CM

## 2019-05-31 LAB — CUP PACEART REMOTE DEVICE CHECK
Battery Remaining Longevity: 90 mo
Battery Remaining Percentage: 97 %
Brady Statistic RV Percent Paced: 0 %
Date Time Interrogation Session: 20210201044200
HighPow Impedance: 68 Ohm
Implantable Lead Implant Date: 20071205
Implantable Lead Location: 753860
Implantable Lead Model: 137
Implantable Lead Serial Number: 102363
Implantable Pulse Generator Implant Date: 20141105
Lead Channel Impedance Value: 414 Ohm
Lead Channel Pacing Threshold Amplitude: 0.8 V
Lead Channel Pacing Threshold Pulse Width: 0.5 ms
Lead Channel Setting Pacing Amplitude: 2.4 V
Lead Channel Setting Pacing Pulse Width: 0.5 ms
Lead Channel Setting Sensing Sensitivity: 0.6 mV
Pulse Gen Serial Number: 126146

## 2019-06-01 NOTE — Progress Notes (Signed)
ICD Remote  

## 2019-08-15 NOTE — Progress Notes (Signed)
Cardiology Office Note   Date:  08/16/2019   ID:  Karen [name], DOB [birth year], MRN [id number]  PCP:  Nolene Ebbs, MD    No chief complaint on file.  CAD  Wt Readings from Last 3 Encounters:  08/16/19 163 lb 12.8 oz (74.3 kg)  01/15/19 168 lb (76.2 kg)  08/13/18 168 lb (76.2 kg)       History of Present Illness: Karen [name] is a 74 y.o. female  who has had a complicated cardiac history. She had a cardiac arrest in the early 08/01/98. At that time, she had an AICD placed in [place] by Dr. Chuck Hint. In 07-31-05, she had another cardiac arrest at the time of hospitalization. Cardiac cath showed an occluded left main which was angioplastied. She then went to emergency CABG. she recovered from that episode.   Since that time, she has had chronic chest discomfort. His lead to a repeat cardiac cath in August 01, 2006. It showed that her grafts were closed. Her native left main had opened. She did have some other branch vessel disease. She has been managed medically. SHe has had some chest pain since stopping the Ranexa which was no longer covered by insurance, but improved with Imdur. Last stress test was in July 2015 and showed no evidence of ischemia with normal LV function.  Husband passed away in 07-31-2017.  Since the last visit, she has chronic back pain.    Denies :  Dizziness. Leg edema. Nitroglycerin use. Orthopnea. Palpitations. Paroxysmal nocturnal dyspnea. Shortness of breath. Syncope.   Rare episodes of chest pain.  Used NTG once.    Has had both COVID shots.     Past Medical History:  Diagnosis Date  . Acute myocardial infarction, unspecified site, episode of care unspecified   . Allergic rhinitis, cause unspecified   . CAD (coronary artery disease)   . GERD (gastroesophageal reflux disease)   . Headache(784.0)   . HTN (hypertension)   . Other and unspecified hyperlipidemia   . Other diseases of lung, not elsewhere classified   . Postsurgical aortocoronary bypass  status    hx of it.     Past Surgical History:  Procedure Laterality Date  . CARDIAC DEFIBRILLATOR PLACEMENT  03/2006   Guidant. remote-yes.   . CORONARY ARTERY BYPASS GRAFT     x4  . IMPLANTABLE CARDIOVERTER DEFIBRILLATOR GENERATOR CHANGE N/A 03/03/2013   Procedure: IMPLANTABLE CARDIOVERTER DEFIBRILLATOR GENERATOR CHANGE;  Surgeon: Deboraha Sprang, MD;  Location: [facility] CATH LAB;  Service: Cardiovascular;  Laterality: N/A;  . stent implant     x1. possibly x2.      Current Outpatient Medications  Medication Sig Dispense Refill  . acetaminophen (TYLENOL) 325 MG tablet Take 2 tablets (650 mg total) by mouth every 6 (six) hours as needed. 60 tablet 0  . amLODipine (NORVASC) 10 MG tablet Take 1 tablet (10 mg total) by mouth daily. 90 tablet 3  . atorvastatin (LIPITOR) 10 MG tablet Take 1 tablet (10 mg total) by mouth daily. 90 tablet 3  . clopidogrel (PLAVIX) 75 MG tablet TAKE 1 TABLET(75 MG) BY MOUTH DAILY 90 tablet 3  . furosemide (LASIX) 20 MG tablet TAKE 1 TABLET(20 MG) BY MOUTH DAILY 90 tablet 3  . isosorbide mononitrate (IMDUR) 120 MG 24 hr tablet Take 1 tablet (120 mg total) by mouth daily. 90 tablet 3  . lisinopril (ZESTRIL) 10 MG tablet Take 1 tablet (10 mg total) by mouth daily. 90 tablet 3  . metoprolol tartrate (LOPRESSOR)  25 MG tablet take 1 tablet by mouth twice a day 180 tablet 3  . nitroGLYCERIN (NITROSTAT) 0.4 MG SL tablet Place 1 tablet (0.4 mg total) under the tongue every 5 (five) minutes as needed for chest pain (X3 DOSES MAX). 25 tablet 3  . oxyCODONE-acetaminophen (PERCOCET) 7.5-325 MG tablet oxycodone-acetaminophen 7.5 mg-325 mg tablet    . ranolazine (RANEXA) 500 MG 12 hr tablet TAKE 1 TABLET(500 MG) BY MOUTH TWICE DAILY 180 tablet 3   No current facility-administered medications for this visit.    Allergies:   Codeine and Percocet [oxycodone-acetaminophen]    Social History:  The patient  reports that she has quit smoking. She has never used smokeless tobacco.  She reports that she does not drink alcohol or use drugs.   Family History:  The patient's family history includes Breast cancer in her maternal aunt; Heart attack in her mother; Heart disease in her mother.    ROS:  Please see the history of present illness.   Otherwise, review of systems are positive for back pain.   All other systems are reviewed and negative.    PHYSICAL EXAM: VS:  BP (!) 120/54   Pulse 68   Ht 5\' 3"  (1.6 m)   Wt 163 lb 12.8 oz (74.3 kg)   SpO2 98%   BMI 29.02 kg/m  , BMI Body mass index is 29.02 kg/m. GEN: Well nourished, well developed, in no acute distress  HEENT: normal  Neck: no JVD, carotid bruits, or masses Cardiac: RRR; no murmurs, rubs, or gallops,no edema  Respiratory:  clear to auscultation bilaterally, normal work of breathing GI: soft, nontender, nondistended, + BS MS: no deformity or atrophy  Skin: warm and dry, no rash Neuro:  Strength and sensation are intact Psych: euthymic mood, full affect   EKG:   The ekg ordered today demonstrates NSR, no ST changes   Recent Labs: 01/15/2019: BUN 10; Creatinine, Ser 1.30; Magnesium 1.9; Potassium 4.0; Sodium 140   Lipid Panel    Component Value Date/Time   CHOL 160 02/05/2018 1314   TRIG 74 02/05/2018 1314   HDL 74 02/05/2018 1314   CHOLHDL 2.2 02/05/2018 1314   CHOLHDL 3 12/27/2014 1155   VLDL 11.6 12/27/2014 1155   LDLCALC 71 02/05/2018 1314   LDLDIRECT 82 01/15/2018 1536     Other studies Reviewed: Additional studies/ records that were reviewed today with results demonstrating: PMD labs reviewed.   ASSESSMENT AND PLAN:  1. CAD/Old MI: Angina well controlled. COntinue aggressive secondary prevention.  2. AICD: Follows with Dr. Caryl Comes 3. H/o VF arrest: s/p AICD. 4. Hyperlipidemia: LDL 82 in 2019.  Recheck today.   5. ? Aneurysm [facility].  PT was stopped based on this result, per cardiology.  I do not think our ofice stopped the PT.  I have not seen the CT scan  result.  Patient has result and can bring it to the office. If it is a small dilatation of the aorta, PT should not be a problem but I will see based on the report, and communicate with Dr. Rolena Infante.  Currently, she is not planning on any back surgery.     Current medicines are reviewed at length with the patient today.  The patient concerns regarding her medicines were addressed.  The following changes have been made:  No change  Labs/ tests ordered today include:  No orders of the defined types were placed in this encounter.   Recommend 150 minutes/week of aerobic  exercise Low fat, low carb, high fiber diet recommended  Disposition:   FU in 1 year   Signed, Larae Grooms, MD  08/16/2019 9:36 AM    [facility] [address], [place], [place]  [address] Phone: [phone]; Fax: [phone]

## 2019-08-16 ENCOUNTER — Other Ambulatory Visit: Payer: Self-pay

## 2019-08-16 ENCOUNTER — Ambulatory Visit (INDEPENDENT_AMBULATORY_CARE_PROVIDER_SITE_OTHER): Payer: Medicare Other | Admitting: Interventional Cardiology

## 2019-08-16 ENCOUNTER — Encounter: Payer: Self-pay | Admitting: Interventional Cardiology

## 2019-08-16 VITALS — BP 120/54 | HR 68 | Ht 63.0 in | Wt 163.8 lb

## 2019-08-16 DIAGNOSIS — I25708 Atherosclerosis of coronary artery bypass graft(s), unspecified, with other forms of angina pectoris: Secondary | ICD-10-CM

## 2019-08-16 DIAGNOSIS — I252 Old myocardial infarction: Secondary | ICD-10-CM | POA: Diagnosis not present

## 2019-08-16 DIAGNOSIS — I4901 Ventricular fibrillation: Secondary | ICD-10-CM | POA: Diagnosis not present

## 2019-08-16 DIAGNOSIS — Z95810 Presence of automatic (implantable) cardiac defibrillator: Secondary | ICD-10-CM

## 2019-08-16 DIAGNOSIS — E785 Hyperlipidemia, unspecified: Secondary | ICD-10-CM

## 2019-08-16 LAB — CBC
Hematocrit: 37.8 % (ref 34.0–46.6)
Hemoglobin: 12.9 g/dL (ref 11.1–15.9)
MCH: 31.5 pg (ref 26.6–33.0)
MCHC: 34.1 g/dL (ref 31.5–35.7)
MCV: 92 fL (ref 79–97)
Platelets: 184 10*3/uL (ref 150–450)
RBC: 4.09 x10E6/uL (ref 3.77–5.28)
RDW: 12 % (ref 11.7–15.4)
WBC: 4.1 10*3/uL (ref 3.4–10.8)

## 2019-08-16 LAB — COMPREHENSIVE METABOLIC PANEL
ALT: 10 IU/L (ref 0–32)
AST: 13 IU/L (ref 0–40)
Albumin/Globulin Ratio: 2 (ref 1.2–2.2)
Albumin: 4.5 g/dL (ref 3.7–4.7)
Alkaline Phosphatase: 86 IU/L (ref 39–117)
BUN/Creatinine Ratio: 10 — ABNORMAL LOW (ref 12–28)
BUN: 12 mg/dL (ref 8–27)
Bilirubin Total: 0.3 mg/dL (ref 0.0–1.2)
CO2: 23 mmol/L (ref 20–29)
Calcium: 9.7 mg/dL (ref 8.7–10.3)
Chloride: 105 mmol/L (ref 96–106)
Creatinine, Ser: 1.17 mg/dL — ABNORMAL HIGH (ref 0.57–1.00)
GFR calc Af Amer: 53 mL/min/{1.73_m2} — ABNORMAL LOW (ref >59)
GFR calc non Af Amer: 46 mL/min/{1.73_m2} — ABNORMAL LOW (ref >59)
Globulin, Total: 2.3 g/dL (ref 1.5–4.5)
Glucose: 94 mg/dL (ref 65–99)
Potassium: 4.2 mmol/L (ref 3.5–5.2)
Sodium: 142 mmol/L (ref 134–144)
Total Protein: 6.8 g/dL (ref 6.0–8.5)

## 2019-08-16 LAB — LIPID PANEL
Chol/HDL Ratio: 2.2 ratio (ref 0.0–4.4)
Cholesterol, Total: 165 mg/dL (ref 100–199)
HDL: 74 mg/dL (ref >39)
LDL Chol Calc (NIH): 80 mg/dL (ref 0–99)
Triglycerides: 54 mg/dL (ref 0–149)
VLDL Cholesterol Cal: 11 mg/dL (ref 5–40)

## 2019-08-16 NOTE — Patient Instructions (Signed)
Medication Instructions:  Your physician recommends that you continue on your current medications as directed. Please refer to the Current Medication list given to you today.  *If you need a refill on your cardiac medications before your next appointment, please call your pharmacy*   Lab Work: TODAY: CBC, CMET, LIPIDS  If you have labs (blood work) drawn today and your tests are completely normal, you will receive your results only by: Marland Kitchen MyChart Message (if you have MyChart) OR . A paper copy in the mail If you have any lab test that is abnormal or we need to change your treatment, we will call you to review the results.   Testing/Procedures: None ordered   Follow-Up: At [facility], you and your health needs are our priority.  As part of our continuing mission to provide you with exceptional heart care, we have created designated Provider Care Teams.  These Care Teams include your primary Cardiologist (physician) and Advanced Practice Providers (APPs -  Physician Assistants and Nurse Practitioners) who all work together to provide you with the care you need, when you need it.  We recommend signing up for the patient portal called "MyChart".  Sign up information is provided on this After Visit Summary.  MyChart is used to connect with patients for Virtual Visits (Telemedicine).  Patients are able to view lab/test results, encounter notes, upcoming appointments, etc.  Non-urgent messages can be sent to your provider as well.   To learn more about what you can do with MyChart, go to [email or web address].    Your next appointment:   12 month(s)  The format for your next appointment:   In Person  Provider:   You may see Larae Grooms, MD or one of the following Advanced Practice Providers on your designated Care Team:    Melina Copa, PA-C  Ermalinda Barrios, PA-C    Other Instructions  Please send [place] a copy of the CT scan that you had done at [facility]   High-Fiber  Diet Fiber, also called dietary fiber, is a type of carbohydrate that is found in fruits, vegetables, whole grains, and beans. A high-fiber diet can have many health benefits. Your health care provider may recommend a high-fiber diet to help:  Prevent constipation. Fiber can make your bowel movements more regular.  Lower your cholesterol.  Relieve the following conditions: ? Swelling of veins in the anus (hemorrhoids). ? Swelling and irritation (inflammation) of specific areas of the digestive tract (uncomplicated diverticulosis). ? A problem of the large intestine (colon) that sometimes causes pain and diarrhea (irritable bowel syndrome, IBS).  Prevent overeating as part of a weight-loss plan.  Prevent heart disease, type 2 diabetes, and certain cancers. What is my plan? The recommended daily fiber intake in grams (g) includes:  38 g for men age 46 or younger.  30 g for men over age 42.  45 g for women age 38 or younger.  21 g for women over age 62. You can get the recommended daily intake of dietary fiber by:  Eating a variety of fruits, vegetables, grains, and beans.  Taking a fiber supplement, if it is not possible to get enough fiber through your diet. What do I need to know about a high-fiber diet?  It is better to get fiber through food sources rather than from fiber supplements. There is not a lot of research about how effective supplements are.  Always check the fiber content on the nutrition facts label of any prepackaged food. Look  for foods that contain 5 g of fiber or more per serving.  Talk with a diet and nutrition specialist (dietitian) if you have questions about specific foods that are recommended or not recommended for your medical condition, especially if those foods are not listed below.  Gradually increase how much fiber you consume. If you increase your intake of dietary fiber too quickly, you may have bloating, cramping, or gas.  Drink plenty of water.  Water helps you to digest fiber. What are tips for following this plan?  Eat a wide variety of high-fiber foods.  Make sure that half of the grains that you eat each day are whole grains.  Eat breads and cereals that are made with whole-grain flour instead of refined flour or white flour.  Eat brown rice, bulgur wheat, or millet instead of white rice.  Start the day with a breakfast that is high in fiber, such as a cereal that contains 5 g of fiber or more per serving.  Use beans in place of meat in soups, salads, and pasta dishes.  Eat high-fiber snacks, such as berries, raw vegetables, nuts, and popcorn.  Choose whole fruits and vegetables instead of processed forms like juice or sauce. What foods can I eat?  Fruits Berries. Pears. Apples. Oranges. Avocado. Prunes and raisins. Dried figs. Vegetables Sweet potatoes. Spinach. Kale. Artichokes. Cabbage. Broccoli. Cauliflower. Green peas. Carrots. Squash. Grains Whole-grain breads. Multigrain cereal. Oats and oatmeal. Brown rice. Barley. Bulgur wheat. Jamul. Quinoa. Bran muffins. Popcorn. Rye wafer crackers. Meats and other proteins Navy, kidney, and pinto beans. Soybeans. Split peas. Lentils. Nuts and seeds. Dairy Fiber-fortified yogurt. Beverages Fiber-fortified soy milk. Fiber-fortified orange juice. Other foods Fiber bars. The items listed above may not be a complete list of recommended foods and beverages. Contact a dietitian for more options. What foods are not recommended? Fruits Fruit juice. Cooked, strained fruit. Vegetables Fried potatoes. Canned vegetables. Well-cooked vegetables. Grains White bread. Pasta made with refined flour. White rice. Meats and other proteins Fatty cuts of meat. Fried chicken or fried fish. Dairy Milk. Yogurt. Cream cheese. Sour cream. Fats and oils Butters. Beverages Soft drinks. Other foods Cakes and pastries. The items listed above may not be a complete list of foods and  beverages to avoid. Contact a dietitian for more information. Summary  Fiber is a type of carbohydrate. It is found in fruits, vegetables, whole grains, and beans.  There are many health benefits of eating a high-fiber diet, such as preventing constipation, lowering blood cholesterol, helping with weight loss, and reducing your risk of heart disease, diabetes, and certain cancers.  Gradually increase your intake of fiber. Increasing too fast can result in cramping, bloating, and gas. Drink plenty of water while you increase your fiber.  The best sources of fiber include whole fruits and vegetables, whole grains, nuts, seeds, and beans. This information is not intended to replace advice given to you by your health care provider. Make sure you discuss any questions you have with your health care provider. Document Revised: 02/17/2017 Document Reviewed: 02/17/2017 Elsevier Patient Education  2020 [employer].

## 2019-08-23 ENCOUNTER — Other Ambulatory Visit: Payer: Self-pay

## 2019-08-23 MED ORDER — METOPROLOL TARTRATE 25 MG PO TABS: ORAL_TABLET | ORAL | 3 refills | Status: DC

## 2019-08-30 ENCOUNTER — Ambulatory Visit (INDEPENDENT_AMBULATORY_CARE_PROVIDER_SITE_OTHER): Payer: Medicare Other | Admitting: *Deleted

## 2019-08-30 DIAGNOSIS — I4901 Ventricular fibrillation: Secondary | ICD-10-CM | POA: Diagnosis not present

## 2019-08-30 DIAGNOSIS — I428 Other cardiomyopathies: Secondary | ICD-10-CM

## 2019-08-30 DIAGNOSIS — I429 Cardiomyopathy, unspecified: Secondary | ICD-10-CM

## 2019-08-30 LAB — CUP PACEART REMOTE DEVICE CHECK
Battery Remaining Longevity: 90 mo
Battery Remaining Percentage: 94 %
Brady Statistic RV Percent Paced: 0 %
Date Time Interrogation Session: 20210503044100
HighPow Impedance: 72 Ohm
Implantable Lead Implant Date: 20071205
Implantable Lead Location: 753860
Implantable Lead Model: 137
Implantable Lead Serial Number: 102363
Implantable Pulse Generator Implant Date: 20141105
Lead Channel Impedance Value: 470 Ohm
Lead Channel Pacing Threshold Amplitude: 0.8 V
Lead Channel Pacing Threshold Pulse Width: 0.5 ms
Lead Channel Setting Pacing Amplitude: 2.4 V
Lead Channel Setting Pacing Pulse Width: 0.5 ms
Lead Channel Setting Sensing Sensitivity: 0.6 mV
Pulse Gen Serial Number: 126146

## 2019-08-31 NOTE — Progress Notes (Signed)
Remote ICD transmission.   

## 2019-11-02 ENCOUNTER — Other Ambulatory Visit: Payer: Self-pay

## 2019-11-02 MED ORDER — AMLODIPINE BESYLATE 10 MG PO TABS: 10.0000 mg | ORAL_TABLET | Freq: Every day | ORAL | 2 refills | Status: DC

## 2019-11-02 MED ORDER — ATORVASTATIN CALCIUM 10 MG PO TABS: 10.0000 mg | ORAL_TABLET | Freq: Every day | ORAL | 2 refills | Status: DC

## 2019-11-02 MED ORDER — LISINOPRIL 10 MG PO TABS: 10.0000 mg | ORAL_TABLET | Freq: Every day | ORAL | 2 refills | Status: DC

## 2019-11-02 MED ORDER — ISOSORBIDE MONONITRATE ER 120 MG PO TB24: 120.0000 mg | ORAL_TABLET | Freq: Every day | ORAL | 2 refills | Status: DC

## 2019-11-29 ENCOUNTER — Ambulatory Visit (INDEPENDENT_AMBULATORY_CARE_PROVIDER_SITE_OTHER): Payer: Medicare Other | Admitting: *Deleted

## 2019-11-29 DIAGNOSIS — I4901 Ventricular fibrillation: Secondary | ICD-10-CM

## 2019-11-29 LAB — CUP PACEART REMOTE DEVICE CHECK
Battery Remaining Longevity: 84 mo
Battery Remaining Percentage: 91 %
Brady Statistic RV Percent Paced: 0 %
Date Time Interrogation Session: 20210802044100
HighPow Impedance: 72 Ohm
Implantable Lead Implant Date: 20071205
Implantable Lead Location: 753860
Implantable Lead Model: 137
Implantable Lead Serial Number: 102363
Implantable Pulse Generator Implant Date: 20141105
Lead Channel Impedance Value: 492 Ohm
Lead Channel Pacing Threshold Amplitude: 0.8 V
Lead Channel Pacing Threshold Pulse Width: 0.5 ms
Lead Channel Setting Pacing Amplitude: 2.4 V
Lead Channel Setting Pacing Pulse Width: 0.5 ms
Lead Channel Setting Sensing Sensitivity: 0.6 mV
Pulse Gen Serial Number: 126146

## 2019-12-02 NOTE — Progress Notes (Signed)
Remote ICD transmission.   

## 2020-01-29 ENCOUNTER — Other Ambulatory Visit: Payer: Self-pay | Admitting: Physician Assistant

## 2020-01-31 ENCOUNTER — Other Ambulatory Visit: Payer: Self-pay

## 2020-01-31 ENCOUNTER — Encounter: Payer: Self-pay | Admitting: Internal Medicine

## 2020-01-31 ENCOUNTER — Ambulatory Visit (INDEPENDENT_AMBULATORY_CARE_PROVIDER_SITE_OTHER): Payer: Medicare Other | Admitting: Internal Medicine

## 2020-01-31 VITALS — BP 126/66 | HR 65 | Ht 63.0 in | Wt 163.6 lb

## 2020-01-31 DIAGNOSIS — I4901 Ventricular fibrillation: Secondary | ICD-10-CM

## 2020-01-31 DIAGNOSIS — Z95810 Presence of automatic (implantable) cardiac defibrillator: Secondary | ICD-10-CM | POA: Diagnosis not present

## 2020-01-31 LAB — CUP PACEART INCLINIC DEVICE CHECK
Date Time Interrogation Session: 20211004171815
HighPow Impedance: 76 Ohm
Implantable Lead Implant Date: 20071205
Implantable Lead Location: 753860
Implantable Lead Model: 137
Implantable Lead Serial Number: 102363
Implantable Pulse Generator Implant Date: 20141105
Lead Channel Impedance Value: 454 Ohm
Lead Channel Pacing Threshold Amplitude: 0.8 V
Lead Channel Pacing Threshold Pulse Width: 0.5 ms
Lead Channel Sensing Intrinsic Amplitude: 7.4 mV
Lead Channel Setting Pacing Amplitude: 2.4 V
Lead Channel Setting Pacing Pulse Width: 0.5 ms
Lead Channel Setting Sensing Sensitivity: 0.6 mV
Pulse Gen Serial Number: 126146

## 2020-01-31 NOTE — Patient Instructions (Addendum)
Medication Instructions:  Your physician recommends that you continue on your current medications as directed. Please refer to the Current Medication list given to you today.  *If you need a refill on your cardiac medications before your next appointment, please call your pharmacy*   Lab Work: None ordered.  If you have labs (blood work) drawn today and your tests are completely normal, you will receive your results only by: Marland Kitchen MyChart Message (if you have MyChart) OR . A paper copy in the mail If you have any lab test that is abnormal or we need to change your treatment, we will call you to review the results.   Testing/Procedures: None ordered.    Follow-Up: At [facility], you and your health needs are our priority.  As part of our continuing mission to provide you with exceptional heart care, we have created designated Provider Care Teams.  These Care Teams include your primary Cardiologist (physician) and Advanced Practice Providers (APPs -  Physician Assistants and Nurse Practitioners) who all work together to provide you with the care you need, when you need it.  We recommend signing up for the patient portal called "MyChart".  Sign up information is provided on this After Visit Summary.  MyChart is used to connect with patients for Virtual Visits (Telemedicine).  Patients are able to view lab/test results, encounter notes, upcoming appointments, etc.  Non-urgent messages can be sent to your provider as well.   To learn more about what you can do with MyChart, go to [email or web address].    Your next appointment:   12 month(s)  The format for your next appointment:   In Person  Provider:   Virl Axe, MD or APP

## 2020-01-31 NOTE — Progress Notes (Signed)
Patient Care Team: Nolene Ebbs, MD as PCP - General (Internal Medicine) Jettie Booze, MD as PCP - Cardiology (Cardiology) Deboraha Sprang, MD as PCP - Electrophysiology (Cardiology)   HPI  Karen [name] is a 74 y.o. female Seen in followup for ICD implantation for primary prevention in the setting of ischemic heart disease.  Initially implanted 2007 with generator replacement in 2014.  This was done in the context of an EP study >>inducible ventricular arrhythmias.   Subsequently, she presented with acute MI complicated by ventricular fibrillation and underwent left main stenting followed by emergency bypass surgery done by Dr. Faye Ramsay    The patient denies chest pain, shortness of breath, nocturnal dyspnea, orthopnea or peripheral edema.  There have been no palpitations, lightheadedness or syncope.    She continues to care for her disabled son.  Alone.    DATE TEST EF   6/15 MYOVIEW    % No ischemia  10/17 Echo   60-65 %         Date Cr K Hgb  4/21 1.17 4.2 12.9            Records and Results [place]   Past Medical History:  Diagnosis Date  . Acute myocardial infarction, unspecified site, episode of care unspecified   . Allergic rhinitis, cause unspecified   . CAD (coronary artery disease)   . GERD (gastroesophageal reflux disease)   . Headache(784.0)   . HTN (hypertension)   . Other and unspecified hyperlipidemia   . Other diseases of lung, not elsewhere classified   . Postsurgical aortocoronary bypass status    hx of it.     Past Surgical History:  Procedure Laterality Date  . CARDIAC DEFIBRILLATOR PLACEMENT  03/2006   Guidant. remote-yes.   . CORONARY ARTERY BYPASS GRAFT     x4  . IMPLANTABLE CARDIOVERTER DEFIBRILLATOR GENERATOR CHANGE N/A 03/03/2013   Procedure: IMPLANTABLE CARDIOVERTER DEFIBRILLATOR GENERATOR CHANGE;  Surgeon: Deboraha Sprang, MD;  Location: [facility] CATH LAB;  Service: Cardiovascular;  Laterality: N/A;  . stent implant     x1.  possibly x2.     Current Outpatient Medications  Medication Sig Dispense Refill  . acetaminophen (TYLENOL) 325 MG tablet Take 2 tablets (650 mg total) by mouth every 6 (six) hours as needed. 60 tablet 0  . amLODipine (NORVASC) 10 MG tablet Take 1 tablet (10 mg total) by mouth daily. 90 tablet 2  . atorvastatin (LIPITOR) 10 MG tablet Take 1 tablet (10 mg total) by mouth daily. 90 tablet 2  . clopidogrel (PLAVIX) 75 MG tablet TAKE 1 TABLET(75 MG) BY MOUTH DAILY 90 tablet 3  . isosorbide mononitrate (IMDUR) 120 MG 24 hr tablet Take 1 tablet (120 mg total) by mouth daily. 90 tablet 2  . lisinopril (ZESTRIL) 10 MG tablet Take 1 tablet (10 mg total) by mouth daily. 90 tablet 2  . metoprolol tartrate (LOPRESSOR) 25 MG tablet take 1 tablet by mouth twice a day 180 tablet 3  . nitroGLYCERIN (NITROSTAT) 0.4 MG SL tablet Place 1 tablet (0.4 mg total) under the tongue every 5 (five) minutes as needed for chest pain (X3 DOSES MAX). 25 tablet 3  . oxyCODONE-acetaminophen (PERCOCET) 7.5-325 MG tablet oxycodone-acetaminophen 7.5 mg-325 mg tablet    . ranolazine (RANEXA) 500 MG 12 hr tablet TAKE 1 TABLET(500 MG) BY MOUTH TWICE DAILY 180 tablet 3  . furosemide (LASIX) 20 MG tablet TAKE 1 TABLET(20 MG) BY MOUTH DAILY 90 tablet 3  No current facility-administered medications for this visit.    Allergies  Allergen Reactions  . Codeine Itching and Swelling  . Percocet [Oxycodone-Acetaminophen] Hives and Itching      Review of Systems negative except from HPI and PMH  Physical Exam BP 126/66   Pulse 65   Ht 5\' 3"  (1.6 m)   Wt 163 lb 9.6 oz (74.2 kg)   BMI 28.98 kg/m  Well developed and well nourished in no acute distress HENT normal Neck supple with JVP-flat Clear Device pocket well healed; without hematoma or erythema.  There is no tethering  Regular rate and rhythm, no  * murmur Abd-soft with active BS No Clubbing cyanosis   edema Skin-warm and dry A & Oriented  Grossly normal sensory and  motor function  ECG sinus at 65 Intervals 15/08/40 Q waves V2, 3, F T wave flattening V3-V6  Assessment and  Plan  Ischemic heart disease with prior CABG and normal LV function   Hyperlipidemia   VT NS  Implantable defibrillator  [employer]  The patient's device was interrogated.  The information was reviewed. No changes were made in the programming.     Without symptoms of ischemia  No intercurrent Ventricular tachycardia  Discussed palliative care as help in the care of her son.  Suggested she talk to his PCP.

## 2020-02-28 ENCOUNTER — Ambulatory Visit (INDEPENDENT_AMBULATORY_CARE_PROVIDER_SITE_OTHER): Payer: Medicare Other

## 2020-02-28 DIAGNOSIS — I429 Cardiomyopathy, unspecified: Secondary | ICD-10-CM

## 2020-02-28 DIAGNOSIS — I428 Other cardiomyopathies: Secondary | ICD-10-CM

## 2020-02-29 LAB — CUP PACEART REMOTE DEVICE CHECK
Battery Remaining Longevity: 84 mo
Battery Remaining Percentage: 87 %
Brady Statistic RV Percent Paced: 0 %
Date Time Interrogation Session: 20211101044000
HighPow Impedance: 73 Ohm
Implantable Lead Implant Date: 20071205
Implantable Lead Location: 753860
Implantable Lead Model: 137
Implantable Lead Serial Number: 102363
Implantable Pulse Generator Implant Date: 20141105
Lead Channel Impedance Value: 435 Ohm
Lead Channel Pacing Threshold Amplitude: 0.8 V
Lead Channel Pacing Threshold Pulse Width: 0.5 ms
Lead Channel Setting Pacing Amplitude: 2.4 V
Lead Channel Setting Pacing Pulse Width: 0.5 ms
Lead Channel Setting Sensing Sensitivity: 0.6 mV
Pulse Gen Serial Number: 126146

## 2020-03-01 NOTE — Progress Notes (Signed)
Remote ICD transmission.   

## 2020-03-21 ENCOUNTER — Other Ambulatory Visit: Payer: Self-pay

## 2020-03-21 MED ORDER — CLOPIDOGREL BISULFATE 75 MG PO TABS: ORAL_TABLET | ORAL | 1 refills | Status: DC

## 2020-03-21 NOTE — Telephone Encounter (Signed)
Pt's medication was sent to pt's pharmacy as requested. Confirmation received.  °

## 2020-03-25 ENCOUNTER — Other Ambulatory Visit: Payer: Self-pay | Admitting: Physician Assistant

## 2020-05-29 ENCOUNTER — Ambulatory Visit (INDEPENDENT_AMBULATORY_CARE_PROVIDER_SITE_OTHER): Payer: Medicare Other

## 2020-05-29 DIAGNOSIS — I428 Other cardiomyopathies: Secondary | ICD-10-CM

## 2020-05-29 DIAGNOSIS — I429 Cardiomyopathy, unspecified: Secondary | ICD-10-CM

## 2020-05-29 LAB — CUP PACEART REMOTE DEVICE CHECK
Battery Remaining Longevity: 78 mo
Battery Remaining Percentage: 83 %
Brady Statistic RV Percent Paced: 0 %
Date Time Interrogation Session: 20220131044100
HighPow Impedance: 70 Ohm
Implantable Lead Implant Date: 20071205
Implantable Lead Location: 753860
Implantable Lead Model: 137
Implantable Lead Serial Number: 102363
Implantable Pulse Generator Implant Date: 20141105
Lead Channel Impedance Value: 400 Ohm
Lead Channel Pacing Threshold Amplitude: 0.8 V
Lead Channel Pacing Threshold Pulse Width: 0.5 ms
Lead Channel Setting Pacing Amplitude: 2.4 V
Lead Channel Setting Pacing Pulse Width: 0.5 ms
Lead Channel Setting Sensing Sensitivity: 0.6 mV
Pulse Gen Serial Number: 126146

## 2020-06-07 NOTE — Progress Notes (Signed)
Remote ICD transmission.   

## 2020-07-27 ENCOUNTER — Other Ambulatory Visit: Payer: Self-pay | Admitting: Interventional Cardiology

## 2020-08-28 ENCOUNTER — Ambulatory Visit (INDEPENDENT_AMBULATORY_CARE_PROVIDER_SITE_OTHER): Payer: Medicare Other

## 2020-08-28 DIAGNOSIS — I428 Other cardiomyopathies: Secondary | ICD-10-CM | POA: Diagnosis not present

## 2020-08-28 DIAGNOSIS — I429 Cardiomyopathy, unspecified: Secondary | ICD-10-CM

## 2020-08-29 LAB — CUP PACEART REMOTE DEVICE CHECK
Battery Remaining Longevity: 78 mo
Battery Remaining Percentage: 80 %
Brady Statistic RV Percent Paced: 0 %
Date Time Interrogation Session: 20220502044100
HighPow Impedance: 72 Ohm
Implantable Lead Implant Date: 20071205
Implantable Lead Location: 753860
Implantable Lead Model: 137
Implantable Lead Serial Number: 102363
Implantable Pulse Generator Implant Date: 20141105
Lead Channel Impedance Value: 442 Ohm
Lead Channel Pacing Threshold Amplitude: 0.8 V
Lead Channel Pacing Threshold Pulse Width: 0.5 ms
Lead Channel Setting Pacing Amplitude: 2.4 V
Lead Channel Setting Pacing Pulse Width: 0.5 ms
Lead Channel Setting Sensing Sensitivity: 0.6 mV
Pulse Gen Serial Number: 126146

## 2020-08-31 ENCOUNTER — Other Ambulatory Visit: Payer: Self-pay | Admitting: Physician Assistant

## 2020-08-31 DIAGNOSIS — Z122 Encounter for screening for malignant neoplasm of respiratory organs: Secondary | ICD-10-CM

## 2020-09-05 NOTE — Progress Notes (Signed)
Cardiology Office Note   Date:  09/06/2020   ID:  Karen [name], DOB [birth year], MRN [id number]  PCP:  Karen Ebbs, MD    No chief complaint on file.  CAD  Wt Readings from Last 3 Encounters:  09/06/20 154 lb 12.8 oz (70.2 kg)  01/31/20 163 lb 9.6 oz (74.2 kg)  08/16/19 163 lb 12.8 oz (74.3 kg)       History of Present Illness: Karen [name] is a 75 y.o. female  who has had a complicated cardiac history. She had a cardiac arrest in the early 07/29/98. At that time, she had an AICD placed in [place] by Dr. Chuck Hint. In Jul 28, 2005, she had another cardiac arrest at the time of hospitalization. Cardiac cath showed an occluded left main which was angioplastied. She then went to emergency CABG. she recovered from that episode.   Since that time, she has had chronic chest discomfort. His lead to a repeat cardiac cath in Jul 29, 2006. It showed that her grafts were closed. Her native left main had opened. She did have some other branch vessel disease. She has been managed medically. SHe has had some chest pain since stopping the Ranexa which was no longer covered by insurance, but improved with Imdur. Last stress test was in July 2015 and showed no evidence of ischemia with normal LV function.  Husband passed away in 07-28-2017.  She has chronic back pain.    Has had both COVID shots. She plans to take the second booster.    Rare episodes of chest pain.  Did not have any NTG.     Past Medical History:  Diagnosis Date  . Acute myocardial infarction, unspecified site, episode of care unspecified   . Allergic rhinitis, cause unspecified   . CAD (coronary artery disease)   . GERD (gastroesophageal reflux disease)   . Headache(784.0)   . HTN (hypertension)   . Other and unspecified hyperlipidemia   . Other diseases of lung, not elsewhere classified   . Postsurgical aortocoronary bypass status    hx of it.     Past Surgical History:  Procedure Laterality Date  . CARDIAC  DEFIBRILLATOR PLACEMENT  03/2006   [employer]. remote-yes.   . CORONARY ARTERY BYPASS GRAFT     x4  . IMPLANTABLE CARDIOVERTER DEFIBRILLATOR GENERATOR CHANGE N/A 03/03/2013   Procedure: IMPLANTABLE CARDIOVERTER DEFIBRILLATOR GENERATOR CHANGE;  Surgeon: Deboraha Sprang, MD;  Location: [facility] CATH LAB;  Service: Cardiovascular;  Laterality: N/A;  . stent implant     x1. possibly x2.      Current Outpatient Medications  Medication Sig Dispense Refill  . acetaminophen (TYLENOL) 325 MG tablet Take 2 tablets (650 mg total) by mouth every 6 (six) hours as needed. 60 tablet 0  . amLODipine (NORVASC) 10 MG tablet TAKE 1 TABLET(10 MG) BY MOUTH DAILY 90 tablet 0  . atorvastatin (LIPITOR) 10 MG tablet TAKE 1 TABLET(10 MG) BY MOUTH DAILY 90 tablet 0  . clopidogrel (PLAVIX) 75 MG tablet TAKE 1 TABLET(75 MG) BY MOUTH DAILY 90 tablet 0  . furosemide (LASIX) 20 MG tablet TAKE 1 TABLET(20 MG) BY MOUTH DAILY 90 tablet 3  . isosorbide mononitrate (IMDUR) 120 MG 24 hr tablet TAKE 1 TABLET(120 MG) BY MOUTH DAILY 90 tablet 0  . lisinopril (ZESTRIL) 10 MG tablet TAKE 1 TABLET(10 MG) BY MOUTH DAILY 90 tablet 0  . metoprolol tartrate (LOPRESSOR) 25 MG tablet take 1 tablet by mouth twice a day 180 tablet 3  . nitroGLYCERIN (NITROSTAT)  0.4 MG SL tablet Place 1 tablet (0.4 mg total) under the tongue every 5 (five) minutes as needed for chest pain (X3 DOSES MAX). 25 tablet 3  . oxyCODONE-acetaminophen (PERCOCET) 7.5-325 MG tablet oxycodone-acetaminophen 7.5 mg-325 mg tablet    . ranolazine (RANEXA) 500 MG 12 hr tablet TAKE 1 TABLET BY MOUTH TWICE DAILY 180 tablet 1   No current facility-administered medications for this visit.    Allergies:   Codeine and Percocet [oxycodone-acetaminophen]    Social History:  The patient  reports that she has quit smoking. She has never used smokeless tobacco. She reports that she does not drink alcohol and does not use drugs.   Family History:  The patient's family history includes Breast  cancer in her maternal aunt; Heart attack in her mother; Heart disease in her mother.    ROS:  Please see the history of present illness.   Otherwise, review of systems are positive for back pain- limits walking.   All other systems are reviewed and negative.    PHYSICAL EXAM: VS:  BP (!) 104/58   Pulse 73   Ht 5\' 3"  (1.6 m)   Wt 154 lb 12.8 oz (70.2 kg)   SpO2 96%   BMI 27.42 kg/m  , BMI Body mass index is 27.42 kg/m. GEN: Well nourished, well developed, in no acute distress  HEENT: normal  Neck: no JVD, carotid bruits, or masses Cardiac: RRR; no murmurs, rubs, or gallops,no edema  Respiratory:  clear to auscultation bilaterally, normal work of breathing GI: soft, nontender, nondistended, + BS MS: no deformity or atrophy  Skin: warm and dry, no rash Neuro:  Strength and sensation are intact Psych: euthymic mood, full affect   EKG:   The ekg ordered in 10/21 demonstrates NSR, nonspecific ST changes   Recent Labs: No results found for requested labs within last 8760 hours.   Lipid Panel    Component Value Date/Time   CHOL 165 08/16/2019 0959   TRIG 54 08/16/2019 0959   HDL 74 08/16/2019 0959   CHOLHDL 2.2 08/16/2019 0959   CHOLHDL 3 12/27/2014 1155   VLDL 11.6 12/27/2014 1155   LDLCALC 80 08/16/2019 0959   LDLDIRECT 82 01/15/2018 1536     Other studies Reviewed: Additional studies/ records that were reviewed today with results demonstrating: Prior echocardiogram showed normal LV function and normal valvular function.   ASSESSMENT AND PLAN:  1. CAD/Old MI: No clear angina.  Continue aggressive secondary prevention.  No volume overload or heart failure symptoms. 2. AICD: followed by EP.   3. 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  No recent shocks.   4. Hyperlipidemia: The current medical regimen is effective;  continue present plan and medications.  Continue statin. SHe just had blood work.  Will try to get results from [facility].   5. ?aneurysm: January 2021 CT  spine records now available to me.  Aortic atherosclerosis noted.  25 mm dilatation mentioned in the aorta.  This does not meet criteria for aneurysm.  There is some ectasia.  Follow-up ultrasound is recommended in 5 years, which would be January 2026.   Current medicines are reviewed at length with the patient today.  The patient concerns regarding her medicines were addressed.  The following changes have been made:  No change  Labs/ tests ordered today include:  No orders of the defined types were placed in this encounter.   Recommend 150 minutes/week of aerobic exercise Low fat, low carb, high fiber diet recommended  Disposition:   FU in 1 year   Signed, Larae Grooms, MD  09/06/2020 11:43 AM    [facility] [address], [place], [place]  [address] Phone: [phone]; Fax: [phone]

## 2020-09-06 ENCOUNTER — Other Ambulatory Visit: Payer: Self-pay

## 2020-09-06 ENCOUNTER — Ambulatory Visit (INDEPENDENT_AMBULATORY_CARE_PROVIDER_SITE_OTHER): Payer: Medicare Other | Admitting: Interventional Cardiology

## 2020-09-06 ENCOUNTER — Encounter: Payer: Self-pay | Admitting: Interventional Cardiology

## 2020-09-06 VITALS — BP 104/58 | HR 73 | Ht 63.0 in | Wt 154.8 lb

## 2020-09-06 DIAGNOSIS — I429 Cardiomyopathy, unspecified: Secondary | ICD-10-CM

## 2020-09-06 DIAGNOSIS — I25708 Atherosclerosis of coronary artery bypass graft(s), unspecified, with other forms of angina pectoris: Secondary | ICD-10-CM | POA: Diagnosis not present

## 2020-09-06 DIAGNOSIS — I428 Other cardiomyopathies: Secondary | ICD-10-CM | POA: Diagnosis not present

## 2020-09-06 DIAGNOSIS — I4901 Ventricular fibrillation: Secondary | ICD-10-CM

## 2020-09-06 DIAGNOSIS — Z95810 Presence of automatic (implantable) cardiac defibrillator: Secondary | ICD-10-CM

## 2020-09-06 DIAGNOSIS — I252 Old myocardial infarction: Secondary | ICD-10-CM

## 2020-09-06 DIAGNOSIS — E785 Hyperlipidemia, unspecified: Secondary | ICD-10-CM

## 2020-09-06 MED ORDER — NITROGLYCERIN 0.4 MG SL SUBL: 0.4000 mg | SUBLINGUAL_TABLET | SUBLINGUAL | 6 refills | Status: DC | PRN

## 2020-09-06 NOTE — Patient Instructions (Signed)

## 2020-09-14 ENCOUNTER — Ambulatory Visit
Admission: RE | Admit: 2020-09-14 | Discharge: 2020-09-14 | Disposition: A | Discharge status: Home / Self Care | Payer: Medicare Other | Source: Ambulatory Visit | Attending: Physician Assistant | Admitting: Physician Assistant

## 2020-09-14 ENCOUNTER — Other Ambulatory Visit: Payer: Self-pay

## 2020-09-14 DIAGNOSIS — Z122 Encounter for screening for malignant neoplasm of respiratory organs: Secondary | ICD-10-CM

## 2020-09-15 NOTE — Progress Notes (Signed)
Remote ICD transmission.   

## 2020-09-27 ENCOUNTER — Other Ambulatory Visit: Payer: Self-pay | Admitting: Physician Assistant

## 2020-10-04 ENCOUNTER — Ambulatory Visit (INDEPENDENT_AMBULATORY_CARE_PROVIDER_SITE_OTHER): Payer: Medicare Other | Admitting: Emergency Medicine

## 2020-10-04 ENCOUNTER — Other Ambulatory Visit: Payer: Self-pay

## 2020-10-04 ENCOUNTER — Encounter: Payer: Self-pay | Admitting: Emergency Medicine

## 2020-10-04 VITALS — BP 124/60 | HR 78 | Temp 97.3°F | Ht 63.0 in | Wt 153.8 lb

## 2020-10-04 DIAGNOSIS — Z720 Tobacco use: Secondary | ICD-10-CM

## 2020-10-04 DIAGNOSIS — R918 Other nonspecific abnormal finding of lung field: Secondary | ICD-10-CM | POA: Diagnosis not present

## 2020-10-04 NOTE — Progress Notes (Signed)
Subjective:    Patient ID: Karen [name], female    DOB: 10-24-1945, 75 y.o.   MRN: [id number]  HPI 75 year old woman, smoker (~25 pack years) with a history of CAD/CABG, hypertension, GERD, allergic rhinitis, cervical cancer remotely w TAH.  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 She has been seen in our office remotely by Dr. Gwenette Greet for a pulmonary nodule in the right lower lobe originally seen on CT scan of the abdomen (2009).  She is referred today for evaluation of pulmonary nodular disease identified on lung cancer screening CT 09/15/2020 as below  She denies any dyspnea, does sometimes have exertional fatigue especially with vacuuming. Intermittent CP, stable angina that responds to NTG, sees Dr Irish Lack.   LDCT 09/15/2020 reviewed by me, shows diffuse bronchial wall thickening, scattered bilateral pulmonary nodules including a solid spiculated left superior segmental nodule 11 mm, a groundglass pulmonary nodule in the superior segment of the right lower lobe.  Review of Systems As per HPI  Past Medical History:  Diagnosis Date  . Acute myocardial infarction, unspecified site, episode of care unspecified   . Allergic rhinitis, cause unspecified   . CAD (coronary artery disease)   . GERD (gastroesophageal reflux disease)   . Headache(784.0)   . HTN (hypertension)   . Other and unspecified hyperlipidemia   . Other diseases of lung, not elsewhere classified   . Postsurgical aortocoronary bypass status    hx of it.      Family History  Problem Relation Age of Onset  . Heart attack Mother   . Heart disease Mother   . Breast cancer Maternal Aunt      Social History   Socioeconomic History  . Marital status: Legally Separated    Spouse name: Not on file  . Number of children: Not on file  . Years of education: Not on file  . Highest education level: Not on file  Occupational History  . Not on file  Tobacco Use  . Smoking status: Current Some Day Smoker     Packs/day: 0.25    Types: Cigarettes  . Smokeless tobacco: Never Used  . Tobacco comment: 5-6 cigs a day  Vaping Use  . Vaping Use: Never used  Substance and Sexual Activity  . Alcohol use: No  . Drug use: No  . Sexual activity: Not on file  Other Topics Concern  . Not on file  Social History Narrative   Married, 4 children.    Social Determinants of Health   Financial Resource Strain: Not on file  Food Insecurity: Not on file  Transportation Needs: Not on file  Physical Activity: Not on file  Stress: Not on file  Social Connections: Not on file  Intimate Partner Violence: Not on file    No hx TB exposure Has not lived in the [place] or [place] in [place] and [place]   Allergies  Allergen Reactions  . Codeine Itching and Swelling  . Percocet [Oxycodone-Acetaminophen] Hives and Itching     Outpatient Medications Prior to Visit  Medication Sig Dispense Refill  . acetaminophen (TYLENOL) 325 MG tablet Take 2 tablets (650 mg total) by mouth every 6 (six) hours as needed. 60 tablet 0  . amLODipine (NORVASC) 10 MG tablet TAKE 1 TABLET(10 MG) BY MOUTH DAILY 90 tablet 0  . atorvastatin (LIPITOR) 10 MG tablet TAKE 1 TABLET(10 MG) BY MOUTH DAILY 90 tablet 0  . clopidogrel (PLAVIX) 75 MG tablet TAKE 1 TABLET(75 MG) BY MOUTH DAILY 90  tablet 0  . furosemide (LASIX) 20 MG tablet TAKE 1 TABLET(20 MG) BY MOUTH DAILY 90 tablet 3  . isosorbide mononitrate (IMDUR) 120 MG 24 hr tablet TAKE 1 TABLET(120 MG) BY MOUTH DAILY 90 tablet 0  . lisinopril (ZESTRIL) 10 MG tablet TAKE 1 TABLET(10 MG) BY MOUTH DAILY 90 tablet 0  . metoprolol tartrate (LOPRESSOR) 25 MG tablet take 1 tablet by mouth twice a day 180 tablet 3  . nitroGLYCERIN (NITROSTAT) 0.4 MG SL tablet Place 1 tablet (0.4 mg total) under the tongue every 5 (five) minutes as needed for chest pain (X3 DOSES MAX). 25 tablet 6  . oxyCODONE-acetaminophen (PERCOCET) 7.5-325 MG tablet oxycodone-acetaminophen 7.5 mg-325 mg tablet    . ranolazine  (RANEXA) 500 MG 12 hr tablet TAKE 1 TABLET BY MOUTH TWICE DAILY 180 tablet 1   No facility-administered medications prior to visit.          Objective:   Physical Exam  Vitals:   10/04/20 0942  BP: 124/60  Pulse: 78  Temp: (!) 97.3 F (36.3 C)  TempSrc: Temporal  SpO2: 99%  Weight: 153 lb 12.8 oz (69.8 kg)  Height: 5\' 3"  (1.6 m)   Gen: Pleasant, well-nourished, in no distress,  normal affect  ENT: No lesions,  mouth clear, dentures, oropharynx clear, no postnasal drip  Neck: No JVD, no stridor  Lungs: No use of accessory muscles, no crackles or wheezing on normal respiration, very slight end expiratory wheeze on forced expiration  Cardiovascular: RRR, heart sounds normal, no murmur or gallops, no peripheral edema  Musculoskeletal: No deformities, no cyanosis or clubbing  Neuro: alert, awake, non focal  Skin: Warm, no lesions or rash     Assessment & Plan:  Pulmonary nodules/lesions, multiple New solid irregularly shaped left lower lobe pulmonary nodule, new groundglass right lower lobe pulmonary nodule, both suspicious for primary lung malignancy.  We will perform a PET scan to better characterize, suspect that she will require navigational bronchoscopy to sample both.  Will discuss with her after PET scan.  Tobacco use With suspected COPD.  Minimal symptoms but she does have some exertional limitation.  She was on bronchodilators in the remote past often when she was having bronchitic symptoms.  She needs pulmonary function testing to assess her degree of obstruction.  She may benefit from scheduled bronchodilator therapy going forward.  Discussed smoking cessation with her.  Baltazar Apo, MD, PhD 10/04/2020, 10:11 AM [facility] [phone] or if no answer before 7:00PM call [phone] For any issues after 7:00PM please call eLink [phone]

## 2020-10-04 NOTE — Assessment & Plan Note (Signed)
New solid irregularly shaped left lower lobe pulmonary nodule, new groundglass right lower lobe pulmonary nodule, both suspicious for primary lung malignancy.  We will perform a PET scan to better characterize, suspect that she will require navigational bronchoscopy to sample both.  Will discuss with her after PET scan.

## 2020-10-04 NOTE — Assessment & Plan Note (Signed)
With suspected COPD.  Minimal symptoms but she does have some exertional limitation.  She was on bronchodilators in the remote past often when she was having bronchitic symptoms.  She needs pulmonary function testing to assess her degree of obstruction.  She may benefit from scheduled bronchodilator therapy going forward.  Discussed smoking cessation with her.

## 2020-10-04 NOTE — Patient Instructions (Signed)
We will arrange for PET scan to further evaluate your small pulmonary nodules seen on CT scan of the chest. We will arrange for pulmonary function testing Follow with Dr Lamonte Sakai next available opening after your PET scan so that we can review the results together.

## 2020-10-05 ENCOUNTER — Other Ambulatory Visit: Payer: Self-pay | Admitting: Interventional Cardiology

## 2020-10-20 ENCOUNTER — Ambulatory Visit (HOSPITAL_COMMUNITY): Payer: Medicare Other

## 2020-10-25 ENCOUNTER — Other Ambulatory Visit: Payer: Self-pay | Admitting: Interventional Cardiology

## 2020-11-02 ENCOUNTER — Ambulatory Visit (HOSPITAL_COMMUNITY): Payer: Medicare Other

## 2020-11-03 ENCOUNTER — Encounter (HOSPITAL_COMMUNITY): Admission: RE | Admit: 2020-11-03 | Payer: Medicare Other | Source: Ambulatory Visit

## 2020-11-15 ENCOUNTER — Other Ambulatory Visit (HOSPITAL_COMMUNITY): Payer: Medicare Other

## 2020-11-17 ENCOUNTER — Ambulatory Visit: Payer: Medicare Other | Admitting: Emergency Medicine

## 2020-11-20 ENCOUNTER — Telehealth: Payer: Self-pay | Admitting: Emergency Medicine

## 2020-11-20 NOTE — Telephone Encounter (Signed)
I called Karen [name] back she found what she needed stated another [facility] provider was able to get Pet authorized

## 2020-11-27 ENCOUNTER — Ambulatory Visit: Payer: Medicare Other | Admitting: Emergency Medicine

## 2020-11-27 ENCOUNTER — Ambulatory Visit (INDEPENDENT_AMBULATORY_CARE_PROVIDER_SITE_OTHER): Payer: Medicare Other

## 2020-11-27 DIAGNOSIS — I429 Cardiomyopathy, unspecified: Secondary | ICD-10-CM

## 2020-11-27 DIAGNOSIS — I428 Other cardiomyopathies: Secondary | ICD-10-CM

## 2020-11-28 NOTE — Telephone Encounter (Signed)
Rec'd copy in the mail from [employer] for PET scan  - valid 7/19-22 - 12/29/2020.  Auth# [id number].  Provided Dr. Lamonte Sakai with copy of the letter.

## 2020-12-04 LAB — CUP PACEART REMOTE DEVICE CHECK
Battery Remaining Longevity: 72 mo
Battery Remaining Percentage: 77 %
Brady Statistic RV Percent Paced: 0 %
Date Time Interrogation Session: 20220801045300
HighPow Impedance: 72 Ohm
Implantable Lead Implant Date: 20071205
Implantable Lead Location: 753860
Implantable Lead Model: 137
Implantable Lead Serial Number: 102363
Implantable Pulse Generator Implant Date: 20141105
Lead Channel Impedance Value: 451 Ohm
Lead Channel Pacing Threshold Amplitude: 0.8 V
Lead Channel Pacing Threshold Pulse Width: 0.5 ms
Lead Channel Setting Pacing Amplitude: 2.4 V
Lead Channel Setting Pacing Pulse Width: 0.5 ms
Lead Channel Setting Sensing Sensitivity: 0.6 mV
Pulse Gen Serial Number: 126146

## 2020-12-08 ENCOUNTER — Encounter (HOSPITAL_COMMUNITY)
Admission: RE | Admit: 2020-12-08 | Discharge: 2020-12-08 | Disposition: A | Discharge status: Home / Self Care | Payer: Medicare Other | Source: Ambulatory Visit | Attending: Emergency Medicine | Admitting: Emergency Medicine

## 2020-12-08 ENCOUNTER — Other Ambulatory Visit: Payer: Self-pay

## 2020-12-08 DIAGNOSIS — R918 Other nonspecific abnormal finding of lung field: Secondary | ICD-10-CM | POA: Diagnosis present

## 2020-12-08 LAB — GLUCOSE, CAPILLARY: Glucose-Capillary: 92 mg/dL (ref 70–99)

## 2020-12-08 MED ORDER — FLUDEOXYGLUCOSE F - 18 (FDG) INJECTION
7.6000 | Freq: Once | INTRAVENOUS | Status: AC
  Administered 2020-12-08: 7.6 via INTRAVENOUS

## 2020-12-21 ENCOUNTER — Other Ambulatory Visit (HOSPITAL_COMMUNITY): Payer: Self-pay | Admitting: Pulmonary Disease

## 2020-12-21 ENCOUNTER — Encounter (HOSPITAL_COMMUNITY): Payer: Self-pay | Admitting: Radiology

## 2020-12-21 DIAGNOSIS — R918 Other nonspecific abnormal finding of lung field: Secondary | ICD-10-CM

## 2020-12-21 NOTE — Progress Notes (Signed)
Patient Name  Page, Pucciarelli Legal Sex  Female DOB  [birth year] SSN  [id number] Address  [address] [place] [address] Phone  [phone] (Home) *[phone] (Mobile)    RE: CT Biopsy Received: Today Suttle, Rosanne Ashing, MD  Garth Bigness D Approved for CT guided left lower lobe lung mass biopsy.   Dylan        Previous Messages   ----- Message -----  From: Garth Bigness D  Sent: 12/21/2020   9:34 AM EDT  To: Ir Procedure Requests  Subject: CT Biopsy                                       Procedure:  CT Biopsy   Reason:  Multiple lung nodules on CT   History:  NM PET, CT in computer   Provider:  Gwenevere Ghazi   Provider Contact:  [phone]

## 2020-12-21 NOTE — Progress Notes (Signed)
Remote ICD transmission.   

## 2020-12-22 ENCOUNTER — Encounter (HOSPITAL_COMMUNITY): Payer: Self-pay | Admitting: Radiology

## 2020-12-22 NOTE — Progress Notes (Signed)
Patient Name  Karen, [name] Legal Sex  Female DOB  03/13/1946 SSN  [id number] Address  [address] [place] [address] Phone  [phone] (Home) *[phone] (Mobile)    RE: CT Biopsy Received: Today Jettie Booze, MD  Garth Bigness D OK to hold Plavix for 5 days prior to biopsy   JV        Previous Messages   ----- Message -----  From: Jillyn Hidden  Sent: 12/21/2020   9:36 AM EDT  To: Jettie Booze, MD  Subject: FW: CT Biopsy                                   Good morning, Dr Irish Lack, there is an order placed for Karen [name] to have a biopsy done. I see that patient is on Plavix and we will need to hold that for 5 days prior to her having her biopsy. Please advise if okay to hold for 5 days. Thanks Aniceto Boss  ----- Message -----  From: Garth Bigness D  Sent: 12/21/2020   9:34 AM EDT  To: Ir Procedure Requests  Subject: CT Biopsy                                       Procedure:  CT Biopsy   Reason:  Multiple lung nodules on CT   History:  NM PET, CT in computer   Provider:  Gwenevere Ghazi   Provider Contact:  [phone]

## 2020-12-27 ENCOUNTER — Telehealth: Payer: Self-pay | Admitting: Emergency Medicine

## 2020-12-27 NOTE — Telephone Encounter (Signed)
Spoke with the patient. Looks like she never followed w me post-PET scan. She is currently scheduled for TTNA on 9/7. I'll try to get her in to discuss the procedure options. May be better to do FOB if we believe she is a candidate - would let us sample L and R lesions.   Please see if we can get her into a nodule slot before 9/7. Thanks.

## 2020-12-27 NOTE — Telephone Encounter (Signed)
Spoke with pt and scheduled pt on 01/02/21 at 1515 for OV. Nothing further needed at this time.

## 2020-12-28 ENCOUNTER — Ambulatory Visit (HOSPITAL_COMMUNITY): Admission: RE | Admit: 2020-12-28 | Payer: Medicare Other | Source: Ambulatory Visit

## 2021-01-02 ENCOUNTER — Telehealth: Payer: Self-pay | Admitting: Emergency Medicine

## 2021-01-02 ENCOUNTER — Other Ambulatory Visit: Payer: Self-pay

## 2021-01-02 ENCOUNTER — Encounter: Payer: Self-pay | Admitting: Emergency Medicine

## 2021-01-02 ENCOUNTER — Ambulatory Visit (INDEPENDENT_AMBULATORY_CARE_PROVIDER_SITE_OTHER): Payer: Medicare Other | Admitting: Emergency Medicine

## 2021-01-02 ENCOUNTER — Other Ambulatory Visit: Payer: Self-pay | Admitting: Radiology

## 2021-01-02 VITALS — BP 120/60 | HR 87 | Temp 98.6°F | Ht 63.0 in | Wt 154.8 lb

## 2021-01-02 DIAGNOSIS — R918 Other nonspecific abnormal finding of lung field: Secondary | ICD-10-CM

## 2021-01-02 DIAGNOSIS — Z23 Encounter for immunization: Secondary | ICD-10-CM | POA: Diagnosis not present

## 2021-01-02 NOTE — Patient Instructions (Signed)
Go ahead and get your needle biopsy of your left sided pulmonary nodule on 9/7 as planned. Dr. Lamonte Sakai will call you with the biopsy results and we will plan next steps Follow with Dr Lamonte Sakai in 1 month

## 2021-01-02 NOTE — Progress Notes (Signed)
Subjective:    Patient ID: Karen [name], female    DOB: 15-Dec-1945, 75 y.o.   MRN: [id number]  HPI 75 year old woman, smoker (~25 pack years) with a history of CAD/CABG, hypertension, GERD, allergic rhinitis, cervical cancer remotely w TAH.  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 She has been seen in our office remotely by Dr. Gwenette Greet for a pulmonary nodule in the right lower lobe originally seen on CT scan of the abdomen (2009).  She is referred today for evaluation of pulmonary nodular disease identified on lung cancer screening CT 09/15/2020 as below  She denies any dyspnea, does sometimes have exertional fatigue especially with vacuuming. Intermittent CP, stable angina that responds to NTG, sees Dr Irish Lack.   LDCT 09/15/2020 reviewed by me, shows diffuse bronchial wall thickening, scattered bilateral pulmonary nodules including a solid spiculated left superior segmental nodule 11 mm, a groundglass pulmonary nodule in the superior segment of the right lower lobe.  ROV 01/02/21 --74 year old smoker with CAD/CABG, hypertension, remote cervical cancer, AICD for cardiac arrest 2020.  I saw her in June for pulmonary nodular disease including a left lower lobe superior segment 11 mm nodule, right lower lobe groundglass nodule.  We performed a PET scan on 8/12 as below, showed that the left lower lobe nodule was hypermetabolic and there was some mild hypermetabolism in the right lower lobe nodule as well.  She did not follow-up immediately, apparently has a CT guided needle biopsy arranged for 9/7   Review of Systems As per HPI  Past Medical History:  Diagnosis Date   Acute myocardial infarction, unspecified site, episode of care unspecified    Allergic rhinitis, cause unspecified    CAD (coronary artery disease)    GERD (gastroesophageal reflux disease)    Headache(784.0)    HTN (hypertension)    Other and unspecified hyperlipidemia    Other diseases of lung, not elsewhere classified     Postsurgical aortocoronary bypass status    hx of it.      Family History  Problem Relation Age of Onset   Heart attack Mother    Heart disease Mother    Breast cancer Maternal Aunt      Social History   Socioeconomic History   Marital status: Legally Separated    Spouse name: Not on file   Number of children: Not on file   Years of education: Not on file   Highest education level: Not on file  Occupational History   Not on file  Tobacco Use   Smoking status: Some Days    Packs/day: 0.25    Years: 54.00    Pack years: 13.50    Types: Cigarettes    Start date: 1968   Smokeless tobacco: Never   Tobacco comments:    States she only smokes some when she gets nervous as of 01/02/21  Vaping Use   Vaping Use: Never used  Substance and Sexual Activity   Alcohol use: No   Drug use: No   Sexual activity: Not on file  Other Topics Concern   Not on file  Social History Narrative   Married, 4 children.    Social Determinants of Health   Financial Resource Strain: Not on file  Food Insecurity: Not on file  Transportation Needs: Not on file  Physical Activity: Not on file  Stress: Not on file  Social Connections: Not on file  Intimate Partner Violence: Not on file    No hx TB exposure [place]ived [place]   Allergies  Allergen Reactions   Codeine Itching and Swelling   Percocet [Oxycodone-Acetaminophen] Hives and Itching     Outpatient Medications Prior to Visit  Medication Sig Dispense Refill   acetaminophen (TYLENOL) 325 MG tablet Take 2 tablets (650 mg total) by mouth every 6 (six) hours as needed. 60 tablet 0   amLODipine (NORVASC) 10 MG tablet TAKE 1 TABLET(10 MG) BY MOUTH DAILY 90 tablet 0   atorvastatin (LIPITOR) 10 MG tablet TAKE 1 TABLET(10 MG) BY MOUTH DAILY 90 tablet 3   clopidogrel (PLAVIX) 75 MG tablet TAKE 1 TABLET(75 MG) BY MOUTH DAILY 90 tablet 3   furosemide (LASIX) 20 MG tablet TAKE 1 TABLET(20 MG) BY MOUTH  DAILY 90 tablet 3   isosorbide mononitrate (IMDUR) 120 MG 24 hr tablet TAKE 1 TABLET(120 MG) BY MOUTH DAILY 90 tablet 3   lisinopril (ZESTRIL) 10 MG tablet TAKE 1 TABLET(10 MG) BY MOUTH DAILY 90 tablet 0   metoprolol tartrate (LOPRESSOR) 25 MG tablet TAKE 1 TABLET BY MOUTH TWICE DAILY 180 tablet 3   nitroGLYCERIN (NITROSTAT) 0.4 MG SL tablet Place 1 tablet (0.4 mg total) under the tongue every 5 (five) minutes as needed for chest pain (X3 DOSES MAX). 25 tablet 6   oxyCODONE-acetaminophen (PERCOCET) 7.5-325 MG tablet oxycodone-acetaminophen 7.5 mg-325 mg tablet     ranolazine (RANEXA) 500 MG 12 hr tablet TAKE 1 TABLET BY MOUTH TWICE DAILY 180 tablet 1   No facility-administered medications prior to visit.          Objective:   Physical Exam  Vitals:   01/02/21 1521  BP: 120/60  Pulse: 87  Temp: 98.6 F (37 C)  TempSrc: Oral  SpO2: 100%  Weight: 154 lb 12.8 oz (70.2 kg)  Height: 5\' 3"  (1.6 m)   Gen: Pleasant, well-nourished, in no distress,  normal affect  ENT: No lesions,  mouth clear, dentures, oropharynx clear, no postnasal drip  Neck: No JVD, no stridor  Lungs: No use of accessory muscles, no crackles or wheezing on normal respiration, very slight end expiratory wheeze on forced expiration  Cardiovascular: RRR, heart sounds normal, no murmur or gallops, no peripheral edema  Musculoskeletal: No deformities, no cyanosis or clubbing  Neuro: alert, awake, non focal  Skin: Warm, no lesions or rash     Assessment & Plan:  Pulmonary nodules/lesions, multiple 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  Hypermetabolism present on PET scan without any evidence of distant disease.  I had initially planned to arrange for navigational bronchoscopy to sample both sides but she has not followed up until today.  She has a needle biopsy arranged for tomorrow for the nodule in the left.  Because she is at least moderate risk for  general anesthesia and because she is been off her Plavix for 5 days and aspirin for 2 days I think it is okay for her to go ahead with a TTNA on the left.  Depending on that result we can make a decision about whether she is a candidate for SBRT with plans to follow the nodule on the right, possibly even resection.  Alternatively she may require either a repeat TTNA or a bronchoscopy to make decisions about therapy.  I will call her with the TTNA results.  Baltazar Apo, MD, PhD 01/02/2021, 3:36 PM [facility] [phone] or if no answer before 7:00PM call [phone] For any issues  after 7:00PM please call eLink [phone]

## 2021-01-02 NOTE — Assessment & Plan Note (Signed)
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  Hypermetabolism present on PET scan without any evidence of distant disease.  I had initially planned to arrange for navigational bronchoscopy to sample both sides but she has not followed up until today.  She has a needle biopsy arranged for tomorrow for the nodule in the left.  Because she is at least moderate risk for general anesthesia and because she is been off her Plavix for 5 days and aspirin for 2 days I think it is okay for her to go ahead with a TTNA on the left.  Depending on that result we can make a decision about whether she is a candidate for SBRT with plans to follow the nodule on the right, possibly even resection.  Alternatively she may require either a repeat TTNA or a bronchoscopy to make decisions about therapy.  I will call her with the TTNA results.

## 2021-01-02 NOTE — Telephone Encounter (Signed)
Pt having TTNA LLL nodule on 9/7. Follow results

## 2021-01-03 ENCOUNTER — Ambulatory Visit (HOSPITAL_COMMUNITY)
Admission: RE | Admit: 2021-01-03 | Discharge: 2021-01-03 | Disposition: A | Discharge status: Home / Self Care | Payer: Medicare Other | Source: Ambulatory Visit | Attending: Interventional Radiology | Admitting: Interventional Radiology

## 2021-01-03 ENCOUNTER — Ambulatory Visit (HOSPITAL_COMMUNITY)
Admission: RE | Admit: 2021-01-03 | Discharge: 2021-01-03 | Disposition: A | Discharge status: Home / Self Care | Payer: Medicare Other | Source: Ambulatory Visit | Attending: Pulmonary Disease | Admitting: Pulmonary Disease

## 2021-01-03 ENCOUNTER — Encounter (HOSPITAL_COMMUNITY): Payer: Self-pay

## 2021-01-03 DIAGNOSIS — Z7902 Long term (current) use of antithrombotics/antiplatelets: Secondary | ICD-10-CM | POA: Insufficient documentation

## 2021-01-03 DIAGNOSIS — C3432 Malignant neoplasm of lower lobe, left bronchus or lung: Secondary | ICD-10-CM | POA: Insufficient documentation

## 2021-01-03 DIAGNOSIS — I10 Essential (primary) hypertension: Secondary | ICD-10-CM | POA: Diagnosis not present

## 2021-01-03 DIAGNOSIS — F17210 Nicotine dependence, cigarettes, uncomplicated: Secondary | ICD-10-CM | POA: Insufficient documentation

## 2021-01-03 DIAGNOSIS — Z79899 Other long term (current) drug therapy: Secondary | ICD-10-CM | POA: Diagnosis not present

## 2021-01-03 DIAGNOSIS — R918 Other nonspecific abnormal finding of lung field: Secondary | ICD-10-CM | POA: Diagnosis present

## 2021-01-03 DIAGNOSIS — Z9889 Other specified postprocedural states: Secondary | ICD-10-CM

## 2021-01-03 DIAGNOSIS — I2510 Atherosclerotic heart disease of native coronary artery without angina pectoris: Secondary | ICD-10-CM | POA: Diagnosis not present

## 2021-01-03 DIAGNOSIS — Z95810 Presence of automatic (implantable) cardiac defibrillator: Secondary | ICD-10-CM | POA: Diagnosis not present

## 2021-01-03 DIAGNOSIS — I252 Old myocardial infarction: Secondary | ICD-10-CM | POA: Insufficient documentation

## 2021-01-03 DIAGNOSIS — Z951 Presence of aortocoronary bypass graft: Secondary | ICD-10-CM | POA: Diagnosis not present

## 2021-01-03 LAB — CBC
HCT: 38.4 % (ref 36.0–46.0)
Hemoglobin: 12.2 g/dL (ref 12.0–15.0)
MCH: 30.7 pg (ref 26.0–34.0)
MCHC: 31.8 g/dL (ref 30.0–36.0)
MCV: 96.5 fL (ref 80.0–100.0)
Platelets: 166 10*3/uL (ref 150–400)
RBC: 3.98 MIL/uL (ref 3.87–5.11)
RDW: 12.2 % (ref 11.5–15.5)
WBC: 3.5 10*3/uL — ABNORMAL LOW (ref 4.0–10.5)
nRBC: 0 % (ref 0.0–0.2)

## 2021-01-03 LAB — PROTIME-INR
INR: 1.1 (ref 0.8–1.2)
Prothrombin Time: 13.7 seconds (ref 11.4–15.2)

## 2021-01-03 MED ORDER — MIDAZOLAM HCL 2 MG/2ML IJ SOLN
INTRAMUSCULAR | Status: AC | PRN
  Administered 2021-01-03 (×2): 0.5 mg via INTRAVENOUS

## 2021-01-03 MED ORDER — FENTANYL CITRATE (PF) 100 MCG/2ML IJ SOLN
INTRAMUSCULAR | Status: AC | PRN
  Administered 2021-01-03 (×2): 25 ug via INTRAVENOUS

## 2021-01-03 MED ORDER — SODIUM CHLORIDE 0.9 % IV SOLN: INTRAVENOUS | Status: DC

## 2021-01-03 MED ORDER — FENTANYL CITRATE (PF) 100 MCG/2ML IJ SOLN
INTRAMUSCULAR | Status: AC
  Filled 2021-01-03: qty 2

## 2021-01-03 MED ORDER — LIDOCAINE-EPINEPHRINE 1 %-1:100000 IJ SOLN
INTRAMUSCULAR | Status: AC
  Filled 2021-01-03: qty 1

## 2021-01-03 MED ORDER — MIDAZOLAM HCL 2 MG/2ML IJ SOLN
INTRAMUSCULAR | Status: AC
  Filled 2021-01-03: qty 4

## 2021-01-03 NOTE — Procedures (Signed)
Pre procedural Dx: Hypermetabolic pulmonary nodules Post procedural Dx: Same  Technically successful CT guided biopsy of left lower lobe pulmonary nodule   EBL: Trace.   Complications: Minimal amount of asymptomatic peri-biopsy alveolar hemorrhage.   Ronny Bacon, MD Pager #: [phone]

## 2021-01-03 NOTE — H&P (Signed)
Chief Complaint: Patient was seen in consultation today for LLL nodule biopsy.  Referring Physician(s): Hooper  Supervising Physician: Sandi Mariscal  Patient Status: [facility] - Out-pt  History of Present Illness: Karen [name] is a 75 y.o. female has bilateral lower lobe pulmonary nodules consistent with synchronous primary lung cancers.  Initially planned bronchoscopy has been deferred and needle biopsy requested.   Patient discontinued Plavix and ASA as directed.  Pt. Reports being NPO, has her son to drive her home and her granddaughter to stay at the house with her overnight.  Past Medical History:  Diagnosis Date   Acute myocardial infarction, unspecified site, episode of care unspecified    Allergic rhinitis, cause unspecified    CAD (coronary artery disease)    GERD (gastroesophageal reflux disease)    Headache(784.0)    HTN (hypertension)    Other and unspecified hyperlipidemia    Other diseases of lung, not elsewhere classified    Postsurgical aortocoronary bypass status    hx of it.     Past Surgical History:  Procedure Laterality Date   CARDIAC DEFIBRILLATOR PLACEMENT  03/2006   Guidant. remote-yes.    CORONARY ARTERY BYPASS GRAFT     x4   IMPLANTABLE CARDIOVERTER DEFIBRILLATOR GENERATOR CHANGE N/A 03/03/2013   Procedure: IMPLANTABLE CARDIOVERTER DEFIBRILLATOR GENERATOR CHANGE;  Surgeon: Deboraha Sprang, MD;  Location: [facility] CATH LAB;  Service: Cardiovascular;  Laterality: N/A;   stent implant     x1. possibly x2.     Allergies: Codeine and Percocet [oxycodone-acetaminophen]  Medications: Prior to Admission medications   Medication Sig Start Date End Date Taking? Authorizing Provider  acetaminophen (TYLENOL) 325 MG tablet Take 2 tablets (650 mg total) by mouth every 6 (six) hours as needed. 11/06/16  Yes Nanavati, Ankit, MD  amLODipine (NORVASC) 10 MG tablet TAKE 1 TABLET(10 MG) BY MOUTH DAILY 07/27/20  Yes Jettie Booze, MD  atorvastatin  (LIPITOR) 10 MG tablet TAKE 1 TABLET(10 MG) BY MOUTH DAILY 10/25/20  Yes Jettie Booze, MD  clopidogrel (PLAVIX) 75 MG tablet TAKE 1 TABLET(75 MG) BY MOUTH DAILY 10/25/20  Yes Jettie Booze, MD  furosemide (LASIX) 20 MG tablet TAKE 1 TABLET(20 MG) BY MOUTH DAILY 01/31/20  Yes Imogene Burn, PA-C  isosorbide mononitrate (IMDUR) 120 MG 24 hr tablet TAKE 1 TABLET(120 MG) BY MOUTH DAILY 10/25/20  Yes Jettie Booze, MD  lisinopril (ZESTRIL) 10 MG tablet TAKE 1 TABLET(10 MG) BY MOUTH DAILY 07/27/20  Yes Jettie Booze, MD  metoprolol tartrate (LOPRESSOR) 25 MG tablet TAKE 1 TABLET BY MOUTH TWICE DAILY 10/05/20  Yes Jettie Booze, MD  oxyCODONE-acetaminophen (PERCOCET) 7.5-325 MG tablet oxycodone-acetaminophen 7.5 mg-325 mg tablet   Yes [provider]  ranolazine (RANEXA) 500 MG 12 hr tablet TAKE 1 TABLET BY MOUTH TWICE DAILY 09/27/20  Yes Jettie Booze, MD  nitroGLYCERIN (NITROSTAT) 0.4 MG SL tablet Place 1 tablet (0.4 mg total) under the tongue every 5 (five) minutes as needed for chest pain (X3 DOSES MAX). 09/06/20   Jettie Booze, MD     Family History  Problem Relation Age of Onset   Heart attack Mother    Heart disease Mother    Breast cancer Maternal Aunt     Social History   Socioeconomic History   Marital status: Legally Separated    Spouse name: Not on file   Number of children: Not on file   Years of education: Not on file   Highest education level: Not  on file  Occupational History   Not on file  Tobacco Use   Smoking status: Some Days    Packs/day: 0.25    Years: 54.00    Pack years: 13.50    Types: Cigarettes    Start date: 1968   Smokeless tobacco: Never   Tobacco comments:    States she only smokes some when she gets nervous as of 01/02/21  Vaping Use   Vaping Use: Never used  Substance and Sexual Activity   Alcohol use: No   Drug use: No   Sexual activity: Not on file  Other Topics Concern   Not on file  Social  History Narrative   Married, 4 children.    Social Determinants of Health   Financial Resource Strain: Not on file  Food Insecurity: Not on file  Transportation Needs: Not on file  Physical Activity: Not on file  Stress: Not on file  Social Connections: Not on file    Review of Systems: A 12 point ROS discussed and pertinent positives are indicated in the HPI above.  All other systems are negative.  Review of Systems  Constitutional: Negative.   HENT: Negative.    Respiratory: Negative.    Cardiovascular: Negative.   Psychiatric/Behavioral:  Negative for behavioral problems and confusion.    Vital Signs: BP 122/79   Pulse 60   Temp 98.1 F (36.7 C) (Oral)   Ht 5\' 3"  (1.6 m)   Wt 154 lb (69.9 kg)   SpO2 100%   BMI 27.28 kg/m   Physical Exam Constitutional:      Appearance: Normal appearance.  HENT:     Head: Normocephalic and atraumatic.     Mouth/Throat:     Mouth: Mucous membranes are moist.  Pulmonary:     Effort: Pulmonary effort is normal.     Breath sounds: Normal breath sounds.  Skin:    General: Skin is warm and dry.  Neurological:     Mental Status: She is alert and oriented to person, place, and time.  Psychiatric:        Behavior: Behavior normal.        Thought Content: Thought content normal.    Imaging: NM PET Image Initial (PI) Skull Base To Thigh  Result Date: 12/10/2020 CLINICAL DATA:  Initial treatment strategy for bilateral pulmonary nodules. EXAM: NUCLEAR MEDICINE PET SKULL BASE TO THIGH TECHNIQUE: 7.6 mCi F-18 FDG was injected intravenously. Full-ring PET imaging was performed from the skull base to thigh after the radiotracer. CT data was obtained and used for attenuation correction and anatomic localization. Fasting blood glucose: 92 mg/dl COMPARISON:  CT chest dated 09/14/2020 FINDINGS: Mediastinal blood pool activity: SUV max 2.4 Liver activity: SUV max NA NECK: No hypermetabolic cervical lymphadenopathy. Incidental CT findings: none  CHEST: No hypermetabolic mediastinal lymphadenopathy. 6 mm short axis right perihilar node (series 4/image 63), max SUV 3.4). 9 x 14 mm irregular nodule in the superior segment left lower lobe (series 8/image 25), max SUV 2.6. 11 x 15 mm ground-glass nodule in the posterior right lower lobe (series 8/image 32), max SUV 1.8. Incidental CT findings: Atherosclerotic calcifications of the aortic arch. Three vessel coronary atherosclerosis. Left subclavian ICD. ABDOMEN/PELVIS: No abnormal hypermetabolism in the liver, spleen, pancreas, or adrenal glands. No hypermetabolic abdominopelvic lymphadenopathy. Incidental CT findings: Status post cholecystectomy. Atherosclerotic calcifications the abdominal aorta and branch vessels. Sigmoid diverticulosis, without evidence of diverticulitis. Status post hysterectomy. SKELETON: No focal hypermetabolic activity to suggest skeletal metastasis. Incidental CT findings: none IMPRESSION:  9 x 14 mm hypermetabolic irregular nodule in the superior segment left lower lobe, suspicious for primary bronchogenic neoplasm. Additional 11 x 15 mm ground-glass nodule in the posterior right lower lobe demonstrates mild hypermetabolism, raising the possibility of contralateral bronchogenic neoplasm such as noninvasive adenocarcinoma. No findings suspicious for metastatic disease. Electronically Signed   By: Julian Hy M.D.   On: 12/10/2020 19:11    Labs:  CBC: Recent Labs    01/03/21 0916  WBC 3.5*  HGB 12.2  HCT 38.4  PLT 166    COAGS: Recent Labs    01/03/21 0916  INR 1.1    BMP: No results for input(s): NA, K, CL, CO2, GLUCOSE, BUN, CALCIUM, CREATININE, GFRNONAA, GFRAA in the last 8760 hours.  Invalid input(s): CMP  LIVER FUNCTION TESTS: No results for input(s): BILITOT, AST, ALT, ALKPHOS, PROT, ALBUMIN in the last 8760 hours.  Assessment and Plan:  Bilateral lower pulmonary lung nodules, consistent with synchronous primary lung cancers.   Pt is appropriate  candidate to proceed with planned LLL nodule needle aspiration/biopsy.  Risks and benefits of left lower lung biopsy and possible chest tube was discussed with the patient and/or patient's family including, but not limited to bleeding, infection, damage to adjacent structures or low yield requiring additional tests.  All of the questions were answered and there is agreement to proceed.  Consent signed and in chart.   Thank you for this interesting consult.  I greatly enjoyed meeting MAELA TAKEDA and look forward to participating in their care.  A copy of this report was sent to the requesting provider on this date.  Electronically Signed: Trenton Founds Boisseau 01/03/2021, 10:53 AM   I spent a total of  30 Minutes  in face to face in clinical consultation, greater than 50% of which was counseling/coordinating care for left lower lobe nodule biopsy

## 2021-01-03 NOTE — Progress Notes (Signed)
Pt ambulated without difficulty or bleeding.   Discharged home with her son, Fulton Reek, who will drive and her granddaughter, Wells Guiles, who will stay with pt x 24 hrs.

## 2021-01-04 LAB — SURGICAL PATHOLOGY

## 2021-01-04 NOTE — Telephone Encounter (Signed)
Attempted to call patient to review pathology results, home number and mobile number.  Not available and no voicemail.  I will have to try her back.

## 2021-01-04 NOTE — Telephone Encounter (Signed)
Called by pathology  >> adenocarcinoma.

## 2021-01-05 ENCOUNTER — Other Ambulatory Visit (HOSPITAL_COMMUNITY): Payer: Self-pay | Admitting: Pulmonary Disease

## 2021-01-05 ENCOUNTER — Other Ambulatory Visit: Payer: Self-pay | Admitting: Pulmonary Disease

## 2021-01-05 DIAGNOSIS — C801 Malignant (primary) neoplasm, unspecified: Secondary | ICD-10-CM

## 2021-01-08 ENCOUNTER — Telehealth: Payer: Self-pay | Admitting: *Deleted

## 2021-01-08 DIAGNOSIS — R918 Other nonspecific abnormal finding of lung field: Secondary | ICD-10-CM

## 2021-01-08 NOTE — Telephone Encounter (Signed)
I called and spoke to patient. I updated her on appt for Karen [name] this week. She verbalized understanding of appt.

## 2021-01-08 NOTE — Telephone Encounter (Signed)
I received referral on Karen [name] but was unable to reach nor leave vm message.

## 2021-01-08 NOTE — Telephone Encounter (Signed)
I was able to reach the pt on her cell phone - discussed the TTNA results. Referral has been made to [facility]. Will still need to decide how to follow / work up the contralateral nodule.

## 2021-01-10 NOTE — Progress Notes (Signed)
Radiation Oncology         [phone] ________________________________  Multidisciplinary Thoracic Oncology Clinic [facility]) Initial Outpatient Consultation  Name: Karen [name] MRN: [id number]  Date: 01/11/2021  DOB: 02-26-1946  HC:[facility]  Byrum, Rose Fillers, MD   REFERRING PHYSICIAN: Collene Gobble, MD  DIAGNOSIS: The encounter diagnosis was Pulmonary nodules/lesions, multiple.  Biopsy proven adenocarcinoma of LLL, with additional ground-glass nodule in the posterior right lower lobe suspicious for bronchogenic neoplasm such as noninvasive adenocarcinoma    ICD-10-CM   1. Pulmonary nodules/lesions, multiple  R91.8       HISTORY OF PRESENT ILLNESS::Karen [name] is a 75 y.o. female who is being seen here today for consideration of radiation treatment for her newly diagnosed adenocarcinoma of the LLL. ( Of note: The patient has a history of known pulmonary nodule in the right lower lobe originally seen on CT scan of the abdomen in 2009).    The patient was recently referred to Dr. Lamonte Sakai on 10/04/20 following a lung cancer screening CT performed on 09/24/20 which demonstrated scattered bilateral pulmonary nodules, including a solid spiculated left lower superior segmental nodule measuring 11 mm, and a groundglass pulmonary nodule in the superior segment of the right lower lobe. Both of these findings were noted to be suspicious for primary lung malignancy.   PET scan performed on 12/08/20 at the request of Dr. Lamonte Sakai further demonstrated the irregular hypermetabolic left lower lobe nodule to measure 9 x 14 mm; notably suspicious for primary bronchogenic neoplasm. PET also revealed the additional 11 x 15 mm ground-glass nodule in the posterior right lower lobe to demonstrate mild hypermetabolism, raising the possibility of contralateral bronchogenic neoplasm such as noninvasive adenocarcinoma. No findings suspicious for metastatic disease were otherwise visualized.    Accordingly, the patient opted to proceed with biopsy of the left lower lobe on 01/03/21. Pathology revealed adenocarcinoma.  The patient is a widow and has 4 children.  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  The patient has a history for smoking around 1 pack/day for 45 years and quit in 2008 but she smokes few cigarettes every now and then.  She has no history of alcohol or drug abuse. She is primary caregiver of one of her sons who has had a history of benign brain tumor resection as well as a history of dialysis.  The patient was referred today for presentation in the multidisciplinary conference.  Radiology studies and pathology slides were presented there for review and discussion of treatment options.  A consensus was discussed regarding potential next steps.  PREVIOUS RADIATION THERAPY: No  PAST MEDICAL HISTORY:  has a past medical history of Acute myocardial infarction, unspecified site, episode of care unspecified, Allergic rhinitis, cause unspecified, CAD (coronary artery disease), GERD (gastroesophageal reflux disease), Headache(784.0), HTN (hypertension), Other and unspecified hyperlipidemia, Other diseases of lung, not elsewhere classified, and Postsurgical aortocoronary bypass status.    PAST SURGICAL HISTORY: Past Surgical History:  Procedure Laterality Date   CARDIAC DEFIBRILLATOR PLACEMENT  03/2006   Guidant. remote-yes.    CORONARY ARTERY BYPASS GRAFT     x4   IMPLANTABLE CARDIOVERTER DEFIBRILLATOR GENERATOR CHANGE N/A 03/03/2013   Procedure: IMPLANTABLE CARDIOVERTER DEFIBRILLATOR GENERATOR CHANGE;  Surgeon: Deboraha Sprang, MD;  Location: [facility] CATH LAB;  Service: Cardiovascular;  Laterality: N/A;   stent implant     x1. possibly x2.     FAMILY HISTORY: family history includes Breast cancer in her  maternal aunt; Heart attack in her mother; Heart disease in her mother.  SOCIAL HISTORY:  reports that she has been smoking  cigarettes. She started smoking about 54 years ago. She has a 13.50 pack-year smoking history. She has never used smokeless tobacco. She reports that she does not drink alcohol and does not use drugs.  ALLERGIES: Codeine and Percocet [oxycodone-acetaminophen]  MEDICATIONS:  Current Outpatient Medications  Medication Sig Dispense Refill   acetaminophen (TYLENOL) 325 MG tablet Take 2 tablets (650 mg total) by mouth every 6 (six) hours as needed. 60 tablet 0   amLODipine (NORVASC) 10 MG tablet TAKE 1 TABLET(10 MG) BY MOUTH DAILY 90 tablet 0   atorvastatin (LIPITOR) 10 MG tablet TAKE 1 TABLET(10 MG) BY MOUTH DAILY 90 tablet 3   clopidogrel (PLAVIX) 75 MG tablet TAKE 1 TABLET(75 MG) BY MOUTH DAILY 90 tablet 3   furosemide (LASIX) 20 MG tablet TAKE 1 TABLET(20 MG) BY MOUTH DAILY 90 tablet 3   isosorbide mononitrate (IMDUR) 120 MG 24 hr tablet TAKE 1 TABLET(120 MG) BY MOUTH DAILY 90 tablet 3   lisinopril (ZESTRIL) 10 MG tablet TAKE 1 TABLET(10 MG) BY MOUTH DAILY 90 tablet 0   metoprolol tartrate (LOPRESSOR) 25 MG tablet TAKE 1 TABLET BY MOUTH TWICE DAILY 180 tablet 3   nitroGLYCERIN (NITROSTAT) 0.4 MG SL tablet Place 1 tablet (0.4 mg total) under the tongue every 5 (five) minutes as needed for chest pain (X3 DOSES MAX). 25 tablet 6   oxyCODONE-acetaminophen (PERCOCET) 7.5-325 MG tablet oxycodone-acetaminophen 7.5 mg-325 mg tablet     ranolazine (RANEXA) 500 MG 12 hr tablet TAKE 1 TABLET BY MOUTH TWICE DAILY 180 tablet 1   No current facility-administered medications for this encounter.    REVIEW OF SYSTEMS:  REVIEW OF SYSTEMS: A 10+ POINT REVIEW OF SYSTEMS WAS OBTAINED including neurology, dermatology, psychiatry, cardiac, respiratory, lymph, extremities, GI, GU, musculoskeletal, constitutional, reproductive, HEENT. All pertinent positives are noted in the HPI. All others are negative.  She denies any pain within the chest significant cough or hemoptysis.  Patient denies any new bony pain headaches  dizziness or blurred vision   PHYSICAL EXAM:  weight is 153 lb 4.8 oz (69.5 kg). Her temperature is 97.4 F (36.3 C) (abnormal). Her blood pressure is 123/73 and her pulse is 75. Her respiration is 18 and oxygen saturation is 100%.   General: Alert and oriented, in no acute distress HEENT: Head is normocephalic. Extraocular movements are intact.  Neck: Neck is supple, no palpable cervical or supraclavicular lymphadenopathy. Heart: Regular in rate and rhythm with no murmurs, rubs, or gallops. Chest: Clear to auscultation bilaterally, with no rhonchi, wheezes, or rales. Abdomen: Soft, nontender, nondistended, with no rigidity or guarding. Extremities: No cyanosis or edema. Lymphatics: see Neck Exam Skin: No concerning lesions. Musculoskeletal: symmetric strength and muscle tone throughout. Neurologic: Cranial nerves II through XII are grossly intact. No obvious focalities. Speech is fluent. Coordination is intact. Psychiatric: Judgment and insight are intact. Affect is appropriate.    KPS = 90  100 - Normal; no complaints; no evidence of disease. 90   - Able to carry on normal activity; minor signs or symptoms of disease. 80   - Normal activity with effort; some signs or symptoms of disease. 45   - Cares for self; unable to carry on normal activity or to do active work. 60   - Requires occasional assistance, but is able to care for most of his personal needs. 50   - Requires considerable  assistance and frequent medical care. 47   - Disabled; requires special care and assistance. 50   - Severely disabled; hospital admission is indicated although death not imminent. 65   - Very sick; hospital admission necessary; active supportive treatment necessary. 10   - Moribund; fatal processes progressing rapidly. 0     - Dead  Karnofsky DA, Abelmann As[place], Craver LS and Burchenal [facility] [phone]) The use of the nitrogen mustards in the palliative treatment of carcinoma: with particular reference to  bronchogenic carcinoma Cancer 1 634-56  LABORATORY DATA:  Lab Results  Component Value Date   WBC 4.5 01/11/2021   HGB 12.3 01/11/2021   HCT 37.4 01/11/2021   MCV 94.0 01/11/2021   PLT 198 01/11/2021   Lab Results  Component Value Date   NA 140 01/11/2021   K 4.0 01/11/2021   CL 105 01/11/2021   CO2 28 01/11/2021   Lab Results  Component Value Date   ALT 8 01/11/2021   AST 11 (L) 01/11/2021   ALKPHOS 87 01/11/2021   BILITOT 0.3 01/11/2021    PULMONARY FUNCTION TEST:   Recent Review Flowsheet Data   There is no flowsheet data to display.     RADIOGRAPHY: CT BIOPSY  Result Date: 01/03/2021 INDICATION: No known primary, now with hypermetabolic pulmonary nodules worrisome for synchronous lung cancer. Please perform CT-guided biopsy for tissue diagnostic purposes. EXAM: CT-GUIDED LEFT LOWER LOBE PULMONARY NODULE BIOPSY COMPARISON:  PET-CT-12/08/2020 MEDICATIONS: None. ANESTHESIA/SEDATION: Fentanyl 50 mcg IV; Versed 1 mg IV Sedation time: 17 minutes; The patient was continuously monitored during the procedure by the interventional radiology nurse under my direct supervision. CONTRAST:  None COMPLICATIONS: None immediate. PROCEDURE: Informed consent was obtained from the patient following an explanation of the procedure, risks, benefits and alternatives. The patient understands,agrees and consents for the procedure. All questions were addressed. A time out was performed prior to the initiation of the procedure. The patient was positioned left lateral decubitus on the CT table and a limited chest CT was performed for procedural planning demonstrating unchanged size and appearance of the approximately 1.2 x 0.9 cm subpleural nodule within the superior segment of the left lower lobe (image 25, series 3). The operative site was prepped and draped in the usual sterile fashion. Under sterile conditions and local anesthesia, a 17 gauge coaxial needle was advanced into the peripheral aspect of the  nodule. Positioning was confirmed with intermittent CT fluoroscopy and followed by the acquisition of 2 with an 18 gauge core needle biopsy device. The coaxial needle was removed following deployment of a Biosentry plug and superficial hemostasis was achieved with manual compression. Limited postprocedural imaging demonstrates a minimal amount of asymptomatic peri biopsy alveolar hemorrhage. A dressing was applied. The patient tolerated the procedure well without immediate postprocedural complication. The patient was escorted to have an upright chest radiograph. IMPRESSION: Technically successful CT guided core needle core biopsy of hypermetabolic left lower lobe pulmonary nodule. Electronically Signed   By: Sandi Mariscal M.D.   On: 01/03/2021 12:52   DG Chest Port 1 View  Result Date: 01/03/2021 CLINICAL DATA:  Post CT-guided left lower lobe pulmonary nodule biopsy. EXAM: PORTABLE CHEST 1 VIEW COMPARISON:  10/27/2007; PET-CT-12/08/2020 FINDINGS: Grossly unchanged cardiac silhouette and mediastinal contours post median sternotomy and CABG. Atherosclerotic plaque within the thoracic aorta. Stable position of support apparatus. No evidence of complication following CT-guided left lower lobe pulmonary nodule biopsy. Specifically, no pneumothorax or pleural effusion. No focal airspace opacities. No evidence of edema. No acute  osseous abnormalities. IMPRESSION: No evidence of complication following CT-guided left lower lobe pulmonary nodule biopsy. Specifically, no pneumothorax. Electronically Signed   By: Sandi Mariscal M.D.   On: 01/03/2021 12:48      IMPRESSION: Biopsy proven adenocarcinoma of LLL, with additional ground-glass nodule in the posterior right lower lobe suspicious for bronchogenic neoplasm such as low grade adenocarcinoma.  She appears to have 2 early clinical stage I non-small cell lung cancers. the patient is not felt to be a good candidate for surgery of her 2 lesions in light of her medical history.   As above the lesion in the right lung did not undergo biopsy.  She would be a good candidate for a definitive course of SBRT directed her lesion in the left lower lung.  I also discussed potential SBRT treatments directed at her solitary lesion in the right lower lobe.  At this point patient would feel most comfortable with just proceeding with the biopsy-proven lesion in the left lower lobe.  The patient does have a cardio defibrillator implanted in left upper chest.  This appears to be far enough away from her anticipated treatment and therefore this cardio defibrillator can remain in its current position.  I discussed the general course of treatment side effects and potential toxicities of radiation therapy in this situation with the patient.  She appears to understand and wishes to proceed with planned course of treatment.  PLAN: Patient will be scheduled for SBRT simulation in the near future.  Anticipate 3 SBRT treatments directed at the lesion in the left lower lung.  I spent 60 minutes minutes face to face with the patient and more than 50% of that time was spent in counseling and/or coordination of care.   ------------------------------------------------  Blair Promise, PhD, MD  This document serves as a record of services personally performed by Gery Pray, MD. It was created on his behalf by Roney Mans, a trained medical scribe. The creation of this record is based on the scribe's personal observations and the provider's statements to them. This document has been checked and approved by the attending provider.

## 2021-01-11 ENCOUNTER — Other Ambulatory Visit: Payer: Self-pay | Admitting: *Deleted

## 2021-01-11 ENCOUNTER — Inpatient Hospital Stay: Payer: Medicare Other | Attending: Internal Medicine | Admitting: Internal Medicine

## 2021-01-11 ENCOUNTER — Encounter: Payer: Self-pay | Admitting: *Deleted

## 2021-01-11 ENCOUNTER — Other Ambulatory Visit: Payer: Self-pay

## 2021-01-11 ENCOUNTER — Ambulatory Visit
Admission: RE | Admit: 2021-01-11 | Discharge: 2021-01-11 | Disposition: A | Discharge status: Home / Self Care | Payer: Medicare Other | Source: Ambulatory Visit | Attending: Radiation Oncology | Admitting: Radiation Oncology

## 2021-01-11 ENCOUNTER — Inpatient Hospital Stay: Payer: Medicare Other

## 2021-01-11 VITALS — BP 123/73 | HR 75 | Temp 97.4°F | Resp 18 | Wt 153.3 lb

## 2021-01-11 VITALS — BP 123/73 | HR 75 | Temp 97.4°F | Resp 18 | Ht 63.0 in | Wt 153.3 lb

## 2021-01-11 DIAGNOSIS — R918 Other nonspecific abnormal finding of lung field: Secondary | ICD-10-CM

## 2021-01-11 DIAGNOSIS — J449 Chronic obstructive pulmonary disease, unspecified: Secondary | ICD-10-CM

## 2021-01-11 DIAGNOSIS — F17210 Nicotine dependence, cigarettes, uncomplicated: Secondary | ICD-10-CM | POA: Diagnosis not present

## 2021-01-11 DIAGNOSIS — I2510 Atherosclerotic heart disease of native coronary artery without angina pectoris: Secondary | ICD-10-CM

## 2021-01-11 DIAGNOSIS — Z79899 Other long term (current) drug therapy: Secondary | ICD-10-CM | POA: Insufficient documentation

## 2021-01-11 DIAGNOSIS — C3432 Malignant neoplasm of lower lobe, left bronchus or lung: Secondary | ICD-10-CM | POA: Insufficient documentation

## 2021-01-11 DIAGNOSIS — C3490 Malignant neoplasm of unspecified part of unspecified bronchus or lung: Secondary | ICD-10-CM

## 2021-01-11 LAB — CMP (CANCER CENTER ONLY)
ALT: 8 U/L (ref 0–44)
AST: 11 U/L — ABNORMAL LOW (ref 15–41)
Albumin: 3.9 g/dL (ref 3.5–5.0)
Alkaline Phosphatase: 87 U/L (ref 38–126)
Anion gap: 7 (ref 5–15)
BUN: 16 mg/dL (ref 8–23)
CO2: 28 mmol/L (ref 22–32)
Calcium: 9.6 mg/dL (ref 8.9–10.3)
Chloride: 105 mmol/L (ref 98–111)
Creatinine: 1.3 mg/dL — ABNORMAL HIGH (ref 0.44–1.00)
GFR, Estimated: 43 mL/min — ABNORMAL LOW (ref >60)
Glucose, Bld: 91 mg/dL (ref 70–99)
Potassium: 4 mmol/L (ref 3.5–5.1)
Sodium: 140 mmol/L (ref 135–145)
Total Bilirubin: 0.3 mg/dL (ref 0.3–1.2)
Total Protein: 6.8 g/dL (ref 6.5–8.1)

## 2021-01-11 LAB — CBC WITH DIFFERENTIAL (CANCER CENTER ONLY)
Abs Immature Granulocytes: 0.01 10*3/uL (ref 0.00–0.07)
Basophils Absolute: 0 10*3/uL (ref 0.0–0.1)
Basophils Relative: 0 %
Eosinophils Absolute: 0.1 10*3/uL (ref 0.0–0.5)
Eosinophils Relative: 1 %
HCT: 37.4 % (ref 36.0–46.0)
Hemoglobin: 12.3 g/dL (ref 12.0–15.0)
Immature Granulocytes: 0 %
Lymphocytes Relative: 44 %
Lymphs Abs: 2 10*3/uL (ref 0.7–4.0)
MCH: 30.9 pg (ref 26.0–34.0)
MCHC: 32.9 g/dL (ref 30.0–36.0)
MCV: 94 fL (ref 80.0–100.0)
Monocytes Absolute: 0.3 10*3/uL (ref 0.1–1.0)
Monocytes Relative: 7 %
Neutro Abs: 2.1 10*3/uL (ref 1.7–7.7)
Neutrophils Relative %: 48 %
Platelet Count: 198 10*3/uL (ref 150–400)
RBC: 3.98 MIL/uL (ref 3.87–5.11)
RDW: 12 % (ref 11.5–15.5)
WBC Count: 4.5 10*3/uL (ref 4.0–10.5)
nRBC: 0 % (ref 0.0–0.2)

## 2021-01-11 NOTE — Progress Notes (Signed)
See flow sheet

## 2021-01-11 NOTE — Progress Notes (Signed)
The proposed treatment discussed in conference is for discussion purpose only and is not a binding recommendation.  The patients have not been physically examined, or presented with their treatment options.  Therefore, final treatment plans cannot be decided.  

## 2021-01-11 NOTE — Progress Notes (Signed)
[place] [phone]   [phone] Multidisciplinary thoracic oncology clinic  CONSULT NOTE  REFERRING PHYSICIAN: Dr. Baltazar Apo  REASON FOR CONSULTATION:  75 years old African-American female recently diagnosed with lung cancer  HPI Karen [name] is a 75 y.o. female with past medical history significant for coronary artery disease status postmyocardial infarction twice in 2006 and 2008, GERD, hypertension, dyslipidemia as well as long history of smoking.  The patient was seen by a new primary care physician at [facility] and because of the long smoking history she had CT screening of the chest on 09/15/2020 and that showed 1.1 cm superior segment left lower lobe lung nodule.  The patient was referred to Dr. Lamonte Sakai for evaluation and he ordered a PET scan which was performed on 12/08/2020 and it showed 0.9 x 1.4 cm hypermetabolic irregular nodule in the superior segment of the left lower lobe suspicious for primary bronchogenic neoplasm.  There was additional 1.1 x 1.5 cm groundglass nodule in the posterior right lower lobe with mild hypermetabolism raising the possibility of contralateral bronchogenic neoplasm such as noninvasive adenocarcinoma.  There was no finding suspicious for metastatic disease.  And no mediastinal lymphadenopathy.  The patient had CT-guided core biopsy of the left lower lobe lung nodule by interventional radiology on 01/03/2021 and the final pathology [phone] [id number]) was consistent with adenocarcinoma.  The patient is not a great surgical candidate because of her COPD as well as a cardiac condition.  Dr. The patient is not a great surgical candidate because of her COPD as well as a cardiac condition.  Dr. Lamonte Sakai kindly referred the patient to the multidisciplinary thoracic oncology clinic today for evaluation and recommendation regarding treatment of her condition. When seen today she is feeling fine with no concerning  complaints except for mild cough as well as occasional nausea and 1 day of diarrhea last week.  She denied having any significant chest pain, shortness of breath or hemoptysis.  She denied having any nausea, vomiting, diarrhea or constipation.  She has no fever or chills.  She lost few pounds since her last evaluation.  She has headache for the last 2 months. Family history significant for mother with heart disease.  Brother had colon cancer.  Father had leukemia and maternal aunt had breast cancer. The patient is a widow and has 4 children.  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  The patient has a history for smoking around 1 pack/day for 45 years and quit in 2008 but she smokes few cigarettes every now and then.  She has no history of alcohol or drug abuse.    HPI  Past Medical History:  Diagnosis Date   Acute myocardial infarction, unspecified site, episode of care unspecified    Allergic rhinitis, cause unspecified    CAD (coronary artery disease)    GERD (gastroesophageal reflux disease)    Headache(784.0)    HTN (hypertension)    Other and unspecified hyperlipidemia    Other diseases of lung, not elsewhere classified    Postsurgical aortocoronary bypass status    hx of it.     Past Surgical History:  Procedure Laterality Date   CARDIAC DEFIBRILLATOR PLACEMENT  03/2006   [employer]ant. remote-yes.    CORONARY ARTERY BYPASS GRAFT     x4   IMPLANTABLE CARDIOVERTER DEFIBRILLATOR GENERATOR CHANGE N/A 03/03/2013   Procedure: IMPLANTABLE CARDIOVERTER DEFIBRILLATOR GENERATOR CHANGE;  Surgeon: Deboraha Sprang, MD;  Location: [facility] CATH LAB;  Service: Cardiovascular;  Laterality: N/A;   stent implant     x1. possibly x2.     Family History  Problem Relation Age of Onset   Heart attack Mother    Heart disease Mother    Breast cancer Maternal Aunt     Social History Social History   Tobacco Use   Smoking status: Some Days     Packs/day: 0.25    Years: 54.00    Pack years: 13.50    Types: Cigarettes    Start date: 1968   Smokeless tobacco: Never   Tobacco comments:    States she only smokes some when she gets nervous as of 01/02/21  Vaping Use   Vaping Use: Never used  Substance Use Topics   Alcohol use: No   Drug use: No    Allergies  Allergen Reactions   Codeine Itching and Swelling   Percocet [Oxycodone-Acetaminophen] Hives and Itching    Current Outpatient Medications  Medication Sig Dispense Refill   acetaminophen (TYLENOL) 325 MG tablet Take 2 tablets (650 mg total) by mouth every 6 (six) hours as needed. 60 tablet 0   amLODipine (NORVASC) 10 MG tablet TAKE 1 TABLET(10 MG) BY MOUTH DAILY 90 tablet 0   atorvastatin (LIPITOR) 10 MG tablet TAKE 1 TABLET(10 MG) BY MOUTH DAILY 90 tablet 3   clopidogrel (PLAVIX) 75 MG tablet TAKE 1 TABLET(75 MG) BY MOUTH DAILY 90 tablet 3   furosemide (LASIX) 20 MG tablet TAKE 1 TABLET(20 MG) BY MOUTH DAILY 90 tablet 3   isosorbide mononitrate (IMDUR) 120 MG 24 hr tablet TAKE 1 TABLET(120 MG) BY MOUTH DAILY 90 tablet 3   lisinopril (ZESTRIL) 10 MG tablet TAKE 1 TABLET(10 MG) BY MOUTH DAILY 90 tablet 0   metoprolol tartrate (LOPRESSOR) 25 MG tablet TAKE 1 TABLET BY MOUTH TWICE DAILY 180 tablet 3   nitroGLYCERIN (NITROSTAT) 0.4 MG SL tablet Place 1 tablet (0.4 mg total) under the tongue every 5 (five) minutes as needed for chest pain (X3 DOSES MAX). 25 tablet 6   oxyCODONE-acetaminophen (PERCOCET) 7.5-325 MG tablet oxycodone-acetaminophen 7.5 mg-325 mg tablet     ranolazine (RANEXA) 500 MG 12 hr tablet TAKE 1 TABLET BY MOUTH TWICE DAILY 180 tablet 1   No current facility-administered medications for this visit.    Review of Systems  Constitutional: positive for weight loss Eyes: negative Ears, nose, mouth, throat, and face: negative Respiratory: positive for cough Cardiovascular: negative Gastrointestinal: positive for  nausea Genitourinary:negative Integument/breast: negative Hematologic/lymphatic: negative Musculoskeletal:negative Neurological: positive for headaches Behavioral/Psych: negative Endocrine: negative Allergic/Immunologic: negative  Physical Exam  WGN:FAOZH, healthy, no distress, well nourished, and well developed SKIN: skin color, texture, turgor are normal, no rashes or significant lesions HEAD: Normocephalic, No masses, lesions, tenderness or abnormalities EYES: normal, PERRLA, Conjunctiva are pink and non-injected EARS: External ears normal, Canals clear OROPHARYNX:no exudate, no erythema, and lips, buccal mucosa, and tongue normal  NECK: supple, no adenopathy, no JVD LYMPH:  no palpable lymphadenopathy, no hepatosplenomegaly BREAST:not examined LUNGS: clear to auscultation , and palpation HEART: regular rate & rhythm, no murmurs, and no gallops ABDOMEN:abdomen soft, non-tender, normal bowel sounds, and no masses or organomegaly BACK: No CVA tenderness, Range of motion is normal EXTREMITIES:no joint deformities, effusion, or inflammation, no edema  NEURO: alert & oriented x 3 with fluent speech, no focal motor/sensory deficits  PERFORMANCE STATUS: ECOG 1  LABORATORY DATA: Lab Results  Component Value Date   WBC 4.5 01/11/2021   HGB 12.3  01/11/2021   HCT 37.4 01/11/2021   MCV 94.0 01/11/2021   PLT 198 01/11/2021      Chemistry      Component Value Date/Time   NA 140 01/11/2021 1351   NA 142 08/16/2019 0959   K 4.0 01/11/2021 1351   CL 105 01/11/2021 1351   CO2 28 01/11/2021 1351   BUN 16 01/11/2021 1351   BUN 12 08/16/2019 0959   CREATININE 1.30 (H) 01/11/2021 1351      Component Value Date/Time   CALCIUM 9.6 01/11/2021 1351   ALKPHOS 87 01/11/2021 1351   AST 11 (L) 01/11/2021 1351   ALT 8 01/11/2021 1351   BILITOT 0.3 01/11/2021 1351       RADIOGRAPHIC STUDIES: CT BIOPSY  Result Date: 01/04/2021 INDICATION: No known primary, now with hypermetabolic  pulmonary nodules worrisome for synchronous lung cancer. Please perform CT-guided biopsy for tissue diagnostic purposes. EXAM: CT-GUIDED LEFT LOWER LOBE PULMONARY NODULE BIOPSY COMPARISON:  PET-CT-12/08/2020 MEDICATIONS: None. ANESTHESIA/SEDATION: Fentanyl 50 mcg IV; Versed 1 mg IV Sedation time: 17 minutes; The patient was continuously monitored during the procedure by the interventional radiology nurse under my direct supervision. CONTRAST:  None COMPLICATIONS: None immediate. PROCEDURE: Informed consent was obtained from the patient following an explanation of the procedure, risks, benefits and alternatives. The patient understands,agrees and consents for the procedure. All questions were addressed. A time out was performed prior to the initiation of the procedure. The patient was positioned left lateral decubitus on the CT table and a limited chest CT was performed for procedural planning demonstrating unchanged size and appearance of the approximately 1.2 x 0.9 cm subpleural nodule within the superior segment of the left lower lobe (image 25, series 3). The operative site was prepped and draped in the usual sterile fashion. Under sterile conditions and local anesthesia, a 17 gauge coaxial needle was advanced into the peripheral aspect of the nodule. Positioning was confirmed with intermittent CT fluoroscopy and followed by the acquisition of 2 with an 18 gauge core needle biopsy device. The coaxial needle was removed following deployment of a Biosentry plug and superficial hemostasis was achieved with manual compression. Limited postprocedural imaging demonstrates a minimal amount of asymptomatic peri biopsy alveolar hemorrhage. A dressing was applied. The patient tolerated the procedure well without immediate postprocedural complication. The patient was escorted to have an upright chest radiograph. IMPRESSION: Technically successful CT guided core needle core biopsy of hypermetabolic left lower lobe pulmonary  nodule. Electronically Signed   By: Sandi Mariscal M.D.   On: 01-04-2021 12:52   DG Chest Port 1 View  Result Date: 01-04-2021 CLINICAL DATA:  Post CT-guided left lower lobe pulmonary nodule biopsy. EXAM: PORTABLE CHEST 1 VIEW COMPARISON:  10/27/2007; PET-CT-12/08/2020 FINDINGS: Grossly unchanged cardiac silhouette and mediastinal contours post median sternotomy and CABG. Atherosclerotic plaque within the thoracic aorta. Stable position of support apparatus. No evidence of complication following CT-guided left lower lobe pulmonary nodule biopsy. Specifically, no pneumothorax or pleural effusion. No focal airspace opacities. No evidence of edema. No acute osseous abnormalities. IMPRESSION: No evidence of complication following CT-guided left lower lobe pulmonary nodule biopsy. Specifically, no pneumothorax. Electronically Signed   By: Sandi Mariscal M.D.   On: 01-04-21 12:48    ASSESSMENT: This is a very pleasant 75 years old African-American female recently diagnosed with a stage IB (T1b, N0, M0) non-small cell lung cancer, adenocarcinoma presented with left lower lobe lung nodule diagnosed in September 2022.   PLAN: I had a lengthy discussion with the patient  today about her current disease stage, prognosis and treatment options. I personally and independently reviewed the scan images and discussed the result and showed the images to the patient today. I recommended for the patient to complete the staging work-up by ordering CT scan of the head to rule out brain metastasis.  The patient cannot get MRI because of the AICD placement. The patient is not a great surgical candidate for resection because of her COPD and cardiac condition. I recommended for her to see Dr. Sondra Come for consideration of SBRT to the left lower lobe lung nodule. I will follow the patient with observation and repeat CT scan of the chest in 4 months to evaluate the effect of the treatment and also to rule out any other new lesion or  disease progression. The patient agreed to the current plan. For the smoking cessation I strongly encouraged the patient to quit smoking. The patient was advised to call immediately if she has any other concerning symptoms in the interval. The patient voices understanding of current disease status and treatment options and is in agreement with the current care plan.  All questions were answered. The patient knows to call the clinic with any problems, questions or concerns. We can certainly see the patient much sooner if necessary.  Thank you so much for allowing me to participate in the care of Karen [name]. I will continue to follow up the patient with you and assist in her care.  The total time spent in the appointment was 60 minutes.  Disclaimer: This note was dictated with voice recognition software. Similar sounding words can inadvertently be transcribed and may not be corrected upon review.   Eilleen Kempf January 11, 2021, 2:33 PM

## 2021-01-12 ENCOUNTER — Telehealth: Payer: Self-pay | Admitting: Internal Medicine

## 2021-01-12 NOTE — Telephone Encounter (Signed)
Scheduled appt per 9/15 los - mailed letter with appt date and time   

## 2021-01-22 ENCOUNTER — Other Ambulatory Visit: Payer: Self-pay | Admitting: Interventional Cardiology

## 2021-01-26 ENCOUNTER — Ambulatory Visit (HOSPITAL_COMMUNITY)
Admission: RE | Admit: 2021-01-26 | Discharge: 2021-01-26 | Disposition: A | Discharge status: Home / Self Care | Payer: Medicare Other | Source: Ambulatory Visit | Attending: Internal Medicine | Admitting: Internal Medicine

## 2021-01-26 ENCOUNTER — Other Ambulatory Visit: Payer: Self-pay

## 2021-01-26 DIAGNOSIS — R519 Headache, unspecified: Secondary | ICD-10-CM | POA: Diagnosis not present

## 2021-01-26 DIAGNOSIS — C3490 Malignant neoplasm of unspecified part of unspecified bronchus or lung: Secondary | ICD-10-CM | POA: Diagnosis not present

## 2021-01-26 DIAGNOSIS — I700 Atherosclerosis of aorta: Secondary | ICD-10-CM | POA: Insufficient documentation

## 2021-01-26 MED ORDER — IOHEXOL 350 MG/ML SOLN
60.0000 mL | Freq: Once | INTRAVENOUS | Status: AC | PRN
  Administered 2021-01-26: 60 mL via INTRAVENOUS

## 2021-01-29 ENCOUNTER — Ambulatory Visit
Admission: RE | Admit: 2021-01-29 | Discharge: 2021-01-29 | Disposition: A | Discharge status: Home / Self Care | Payer: Medicare Other | Source: Ambulatory Visit | Attending: Radiation Oncology | Admitting: Radiation Oncology

## 2021-01-29 ENCOUNTER — Other Ambulatory Visit: Payer: Self-pay

## 2021-01-29 DIAGNOSIS — Z510 Encounter for antineoplastic radiation therapy: Secondary | ICD-10-CM | POA: Diagnosis present

## 2021-01-29 DIAGNOSIS — R918 Other nonspecific abnormal finding of lung field: Secondary | ICD-10-CM

## 2021-01-29 DIAGNOSIS — C3432 Malignant neoplasm of lower lobe, left bronchus or lung: Secondary | ICD-10-CM | POA: Insufficient documentation

## 2021-02-06 ENCOUNTER — Other Ambulatory Visit: Payer: Self-pay

## 2021-02-06 ENCOUNTER — Encounter: Payer: Self-pay | Admitting: Emergency Medicine

## 2021-02-06 ENCOUNTER — Ambulatory Visit (INDEPENDENT_AMBULATORY_CARE_PROVIDER_SITE_OTHER): Payer: Medicare Other | Admitting: Emergency Medicine

## 2021-02-06 DIAGNOSIS — J449 Chronic obstructive pulmonary disease, unspecified: Secondary | ICD-10-CM

## 2021-02-06 DIAGNOSIS — R918 Other nonspecific abnormal finding of lung field: Secondary | ICD-10-CM | POA: Diagnosis not present

## 2021-02-06 MED ORDER — SPIRIVA RESPIMAT 1.25 MCG/ACT IN AERS: 2.0000 | INHALATION_SPRAY | Freq: Every day | RESPIRATORY_TRACT | 0 refills | Status: DC

## 2021-02-06 NOTE — Assessment & Plan Note (Signed)
Based on tobacco history and symptoms suspect COPD although she does not have a formal PFT.  She states that has been on bronchodilators in the past and feels that she benefited.  I will try starting her on Spiriva to see if she gets benefit, have her follow-up here to discuss.  If she does benefit then we will continue the Spiriva, start albuterol as needed.

## 2021-02-06 NOTE — Patient Instructions (Addendum)
We will try starting Spiriva Respimat, 2 puffs once daily.  Take this every day and keep track of whether your breathing benefits. Follow with Dr. Sondra Come to initiate radiation therapy to your left lower lobe nodule as planned. Repeat scan of your chest will be arranged by radiation oncology.  Depending on results we may decide to evaluate or treat your right lower lobe pulmonary nodule further. Follow-up with APP in 1 month to talk about whether you have benefited from the inhaler medication. Follow with Dr Lamonte Sakai in 6 months or sooner if you have any problems

## 2021-02-06 NOTE — Progress Notes (Signed)
Subjective:    Patient ID: Karen [name], female    DOB: 21-Jan-1946, 75 y.o.   MRN: [id number]  HPI 75 year old woman, smoker (~25 pack years) with a history of CAD/CABG, hypertension, GERD, allergic rhinitis, cervical cancer remotely w TAH.  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 She has been seen in our office remotely by Dr. Gwenette Greet for a pulmonary nodule in the right lower lobe originally seen on CT scan of the abdomen (2009).  She is referred today for evaluation of pulmonary nodular disease identified on lung cancer screening CT 09/15/2020 as below  She denies any dyspnea, does sometimes have exertional fatigue especially with vacuuming. Intermittent CP, stable angina that responds to NTG, sees Dr Irish Lack.   LDCT 09/15/2020 reviewed by me, shows diffuse bronchial wall thickening, scattered bilateral pulmonary nodules including a solid spiculated left superior segmental nodule 11 mm, a groundglass pulmonary nodule in the superior segment of the right lower lobe.  ROV 01/02/21 --75 year old smoker with CAD/CABG, hypertension, remote cervical cancer, AICD for cardiac arrest 2020.  I saw her in June for pulmonary nodular disease including a left lower lobe superior segment 11 mm nodule, right lower lobe groundglass nodule.  We performed a PET scan on 8/12 as below, showed that the left lower lobe nodule was hypermetabolic and there was some mild hypermetabolism in the right lower lobe nodule as well.  She did not follow-up immediately, apparently has a CT guided needle biopsy arranged for 9/7.   ROV 02/06/21 --follow-up visit 75 year old patient with history of tobacco use, CAD/CABG, hypertension, remote cervical cancer.  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  She has bilateral lower lobe pulmonary nodules that were hypermetabolic on PET scan 07/19/742.  Her PCP arranged for a left lower lobe nodular TTNA on 01/03/2021 that showed adenocarcinoma.  She has been seen at [facility]  She  is planning for SBRT to the left lower lobe pulmonary nodule, with consideration for more work-up or empiric SBRT to the right lower lobe nodule depending on how it evolves on serial imaging. Today she reports she has been having some bilateral upper leg pain for the last few weeks, bothers her when she walks.  She has some exertional fatigue, has to rest after housework. Has some assistance with shopping, etc.  Has some cough with mucous at night - she is on lisinopril   Review of Systems As per HPI  Past Medical History:  Diagnosis Date   Acute myocardial infarction, unspecified site, episode of care unspecified    Allergic rhinitis, cause unspecified    CAD (coronary artery disease)    GERD (gastroesophageal reflux disease)    Headache(784.0)    HTN (hypertension)    Other and unspecified hyperlipidemia    Other diseases of lung, not elsewhere classified    Postsurgical aortocoronary bypass status    hx of it.      Family History  Problem Relation Age of Onset   Heart attack Mother    Heart disease Mother    Breast cancer Maternal Aunt      Social History   Socioeconomic History   Marital status: Legally Separated    Spouse name: Not on file   Number of children: Not on file   Years of education: Not on file   Highest education level: Not on file  Occupational History   Not on file  Tobacco Use   Smoking status: Some Days    Packs/day: 0.25  Years: 54.00    Pack years: 13.50    Types: Cigarettes    Start date: 1968   Smokeless tobacco: Never   Tobacco comments:    States she only smokes some when she gets nervous as of 01/02/21  Vaping Use   Vaping Use: Never used  Substance and Sexual Activity   Alcohol use: No   Drug use: No   Sexual activity: Not on file  Other Topics Concern   Not on file  Social History Narrative   Married, 4 children.    Social Determinants of Health   Financial Resource Strain: Not on file  Food Insecurity: Not on file   Transportation Needs: Not on file  Physical Activity: Not on file  Stress: Not on file  Social Connections: Not on file  Intimate Partner Violence: Not on file    No hx TB exposure Has not lived in the [place] or [place] in [place] and [place]   Allergies  Allergen Reactions   Codeine Itching and Swelling   Percocet [Oxycodone-Acetaminophen] Hives and Itching     Outpatient Medications Prior to Visit  Medication Sig Dispense Refill   acetaminophen (TYLENOL) 325 MG tablet Take 2 tablets (650 mg total) by mouth every 6 (six) hours as needed. 60 tablet 0   amLODipine (NORVASC) 10 MG tablet TAKE 1 TABLET(10 MG) BY MOUTH DAILY 90 tablet 2   atorvastatin (LIPITOR) 10 MG tablet TAKE 1 TABLET(10 MG) BY MOUTH DAILY 90 tablet 3   clopidogrel (PLAVIX) 75 MG tablet TAKE 1 TABLET(75 MG) BY MOUTH DAILY 90 tablet 3   furosemide (LASIX) 20 MG tablet TAKE 1 TABLET(20 MG) BY MOUTH DAILY 90 tablet 3   isosorbide mononitrate (IMDUR) 120 MG 24 hr tablet TAKE 1 TABLET(120 MG) BY MOUTH DAILY 90 tablet 3   lisinopril (ZESTRIL) 10 MG tablet TAKE 1 TABLET(10 MG) BY MOUTH DAILY 90 tablet 2   metoprolol tartrate (LOPRESSOR) 25 MG tablet TAKE 1 TABLET BY MOUTH TWICE DAILY 180 tablet 3   nitroGLYCERIN (NITROSTAT) 0.4 MG SL tablet Place 1 tablet (0.4 mg total) under the tongue every 5 (five) minutes as needed for chest pain (X3 DOSES MAX). 25 tablet 6   oxyCODONE-acetaminophen (PERCOCET) 7.5-325 MG tablet oxycodone-acetaminophen 7.5 mg-325 mg tablet     ranolazine (RANEXA) 500 MG 12 hr tablet TAKE 1 TABLET BY MOUTH TWICE DAILY 180 tablet 1   No facility-administered medications prior to visit.          Objective:   Physical Exam  Vitals:   02/06/21 1333  BP: 128/62  Pulse: 69  Temp: 98.1 F (36.7 C)  TempSrc: Oral  SpO2: 100%  Weight: 157 lb 3.2 oz (71.3 kg)  Height: 5\' 3"  (1.6 m)   Gen: Pleasant, well-nourished, in no distress,  normal affect  ENT: No lesions,  mouth clear, dentures, oropharynx  clear, no postnasal drip  Neck: No JVD, no stridor  Lungs: No use of accessory muscles, no crackles or wheezing on normal respiration, no wheeze  Cardiovascular: RRR, heart sounds normal, no murmur or gallops, no peripheral edema  Musculoskeletal: No deformities, no cyanosis or clubbing  Neuro: alert, awake, non focal  Skin: Warm, no lesions or rash     Assessment & Plan:  COPD (chronic obstructive pulmonary disease) (HCC) Based on tobacco history and symptoms suspect COPD although she does not have a formal PFT.  She states that has been on bronchodilators in the past and feels that she benefited.  I will try  starting her on Spiriva to see if she gets benefit, have her follow-up here to discuss.  If she does benefit then we will continue the Spiriva, start albuterol as needed.  Pulmonary nodules/lesions, multiple Bilateral lower lobe pulmonary nodules.  The left lower lobe nodule has been confirmed to be adenocarcinoma and she is planning to initiate SBRT.  I suspect that the right lower lobe nodule is the same.  It has not been biopsied.  Depending on how it evolves on serial imaging there may be a role for either empiric SBRT or biopsy of the right sided nodule.  She will follow-up with Dr Sondra Come to manage this.  Baltazar Apo, MD, PhD 02/06/2021, 1:46 PM [facility] [phone] or if no answer before 7:00PM call [phone] For any issues after 7:00PM please call eLink [phone]

## 2021-02-06 NOTE — Assessment & Plan Note (Signed)
Bilateral lower lobe pulmonary nodules.  The left lower lobe nodule has been confirmed to be adenocarcinoma and she is planning to initiate SBRT.  I suspect that the right lower lobe nodule is the same.  It has not been biopsied.  Depending on how it evolves on serial imaging there may be a role for either empiric SBRT or biopsy of the right sided nodule.  She will follow-up with Dr Sondra Come to manage this.

## 2021-02-07 ENCOUNTER — Ambulatory Visit: Payer: Medicare Other | Admitting: Radiation Oncology

## 2021-02-08 ENCOUNTER — Ambulatory Visit
Admission: RE | Admit: 2021-02-08 | Discharge: 2021-02-08 | Disposition: A | Discharge status: Home / Self Care | Payer: Medicare Other | Source: Ambulatory Visit | Attending: Radiation Oncology | Admitting: Radiation Oncology

## 2021-02-08 ENCOUNTER — Other Ambulatory Visit: Payer: Self-pay

## 2021-02-08 ENCOUNTER — Ambulatory Visit: Payer: Medicare Other | Admitting: Radiation Oncology

## 2021-02-08 DIAGNOSIS — R918 Other nonspecific abnormal finding of lung field: Secondary | ICD-10-CM

## 2021-02-08 DIAGNOSIS — Z510 Encounter for antineoplastic radiation therapy: Secondary | ICD-10-CM | POA: Diagnosis not present

## 2021-02-08 NOTE — Progress Notes (Addendum)
Cardiac monitoring provided whilst patient received radiation treatment. Patient's cardiac rhythm remained stable during treatment.

## 2021-02-09 ENCOUNTER — Ambulatory Visit: Payer: Medicare Other | Admitting: Radiation Oncology

## 2021-02-12 ENCOUNTER — Ambulatory Visit: Payer: Medicare Other | Admitting: Radiation Oncology

## 2021-02-13 ENCOUNTER — Other Ambulatory Visit: Payer: Self-pay

## 2021-02-13 ENCOUNTER — Ambulatory Visit
Admission: RE | Admit: 2021-02-13 | Discharge: 2021-02-13 | Disposition: A | Discharge status: Home / Self Care | Payer: Medicare Other | Source: Ambulatory Visit | Attending: Radiation Oncology | Admitting: Radiation Oncology

## 2021-02-13 DIAGNOSIS — R918 Other nonspecific abnormal finding of lung field: Secondary | ICD-10-CM

## 2021-02-13 DIAGNOSIS — Z510 Encounter for antineoplastic radiation therapy: Secondary | ICD-10-CM | POA: Diagnosis not present

## 2021-02-15 ENCOUNTER — Other Ambulatory Visit: Payer: Self-pay

## 2021-02-15 ENCOUNTER — Encounter: Payer: Self-pay | Admitting: Radiation Oncology

## 2021-02-15 ENCOUNTER — Ambulatory Visit
Admission: RE | Admit: 2021-02-15 | Discharge: 2021-02-15 | Disposition: A | Discharge status: Home / Self Care | Payer: Medicare Other | Source: Ambulatory Visit | Attending: Radiation Oncology | Admitting: Radiation Oncology

## 2021-02-15 ENCOUNTER — Observation Stay (HOSPITAL_COMMUNITY)
Admission: EM | Admit: 2021-02-15 | Discharge: 2021-02-16 | Disposition: A | Discharge status: Home / Self Care | Payer: Medicare Other | Attending: Emergency Medicine | Admitting: Emergency Medicine

## 2021-02-15 DIAGNOSIS — J449 Chronic obstructive pulmonary disease, unspecified: Secondary | ICD-10-CM | POA: Diagnosis present

## 2021-02-15 DIAGNOSIS — E782 Mixed hyperlipidemia: Secondary | ICD-10-CM | POA: Diagnosis present

## 2021-02-15 DIAGNOSIS — Z951 Presence of aortocoronary bypass graft: Secondary | ICD-10-CM | POA: Diagnosis not present

## 2021-02-15 DIAGNOSIS — I2510 Atherosclerotic heart disease of native coronary artery without angina pectoris: Secondary | ICD-10-CM | POA: Diagnosis not present

## 2021-02-15 DIAGNOSIS — K219 Gastro-esophageal reflux disease without esophagitis: Secondary | ICD-10-CM | POA: Diagnosis present

## 2021-02-15 DIAGNOSIS — Z7902 Long term (current) use of antithrombotics/antiplatelets: Secondary | ICD-10-CM | POA: Insufficient documentation

## 2021-02-15 DIAGNOSIS — Z79899 Other long term (current) drug therapy: Secondary | ICD-10-CM | POA: Insufficient documentation

## 2021-02-15 DIAGNOSIS — Z95810 Presence of automatic (implantable) cardiac defibrillator: Secondary | ICD-10-CM | POA: Diagnosis not present

## 2021-02-15 DIAGNOSIS — Z20822 Contact with and (suspected) exposure to covid-19: Secondary | ICD-10-CM | POA: Insufficient documentation

## 2021-02-15 DIAGNOSIS — R918 Other nonspecific abnormal finding of lung field: Secondary | ICD-10-CM

## 2021-02-15 DIAGNOSIS — I10 Essential (primary) hypertension: Secondary | ICD-10-CM | POA: Insufficient documentation

## 2021-02-15 DIAGNOSIS — C3490 Malignant neoplasm of unspecified part of unspecified bronchus or lung: Secondary | ICD-10-CM | POA: Diagnosis present

## 2021-02-15 DIAGNOSIS — I25119 Atherosclerotic heart disease of native coronary artery with unspecified angina pectoris: Secondary | ICD-10-CM | POA: Diagnosis present

## 2021-02-15 DIAGNOSIS — R079 Chest pain, unspecified: Secondary | ICD-10-CM | POA: Diagnosis not present

## 2021-02-15 DIAGNOSIS — F17210 Nicotine dependence, cigarettes, uncomplicated: Secondary | ICD-10-CM | POA: Insufficient documentation

## 2021-02-15 DIAGNOSIS — R072 Precordial pain: Secondary | ICD-10-CM

## 2021-02-15 NOTE — ED Triage Notes (Addendum)
Pt brought in by EMS for chest pain off and on for the past 24 hours. Pt took 3 nitro earlier today that seemed to help a little. EMS administered 1 nitro. Pt denies pain at this time. Pt has a history of heart attacks and is currently being treated for lung cancer.

## 2021-02-15 NOTE — ED Provider Notes (Signed)
Emergency Medicine Provider Triage Evaluation Note  Karen [name] , a 75 y.o. female  was evaluated in triage.  Pt complains of chest pain.  The patient presents via EMS.  She has been having intermittent chest pain since yesterday.  Yesterday, she took NTG and the pain resolved.  However, when she awoke this morning the pain had returned.  She has taken 3 nitroglycerin over the course of the day, but pain has continued to return.  Last dose was around 2000.  She has no chest pain at this time.  Pain is central, nonradiating, and feels like tightness.  She states that it feels similar to her previous MI.  Pain has been worse with exertion and improves with rest.  She denies diaphoresis, fever, vomiting.  She is currently being treated for lung cancer.  Last treatment was earlier today.  Review of Systems  Positive: Chest pain, shortness of breath Negative: Diaphoresis, fever, chills, rash, abdominal pain  Physical Exam  BP 134/76   Pulse 77   Temp 98.4 F (36.9 C) (Oral)   Resp 16   Ht 5\' 3"  (1.6 m)   Wt 71.2 kg   SpO2 100%   BMI 27.81 kg/m  Gen:   Awake, no distress   Resp:  Normal effort  MSK:   Moves extremities without difficulty  Other:  Abdomen soft, nontender, nondistended  Medical Decision Making  Medically screening exam initiated at 10:54 PM.  Appropriate orders placed.  JADZIA IBSEN was informed that the remainder of the evaluation will be completed by another provider, this initial triage assessment does not replace that evaluation, and the importance of remaining in the ED until their evaluation is complete.  Labs and imaging have been ordered.  She will require further work-up evaluation the emergency department   Vern Prestia, Laymond Purser, PA-C 02/15/21 2254    Lucrezia Starch, MD 02/16/21 1529

## 2021-02-16 ENCOUNTER — Emergency Department (HOSPITAL_COMMUNITY): Payer: Medicare Other

## 2021-02-16 ENCOUNTER — Encounter (HOSPITAL_COMMUNITY): Payer: Self-pay | Admitting: Internal Medicine

## 2021-02-16 ENCOUNTER — Other Ambulatory Visit: Payer: Self-pay | Admitting: Physician Assistant

## 2021-02-16 DIAGNOSIS — K219 Gastro-esophageal reflux disease without esophagitis: Secondary | ICD-10-CM

## 2021-02-16 DIAGNOSIS — I25709 Atherosclerosis of coronary artery bypass graft(s), unspecified, with unspecified angina pectoris: Secondary | ICD-10-CM | POA: Diagnosis not present

## 2021-02-16 DIAGNOSIS — I10 Essential (primary) hypertension: Secondary | ICD-10-CM | POA: Diagnosis not present

## 2021-02-16 DIAGNOSIS — F17210 Nicotine dependence, cigarettes, uncomplicated: Secondary | ICD-10-CM | POA: Diagnosis present

## 2021-02-16 DIAGNOSIS — C3490 Malignant neoplasm of unspecified part of unspecified bronchus or lung: Secondary | ICD-10-CM | POA: Diagnosis not present

## 2021-02-16 DIAGNOSIS — E782 Mixed hyperlipidemia: Secondary | ICD-10-CM

## 2021-02-16 DIAGNOSIS — R079 Chest pain, unspecified: Secondary | ICD-10-CM | POA: Diagnosis not present

## 2021-02-16 DIAGNOSIS — J449 Chronic obstructive pulmonary disease, unspecified: Secondary | ICD-10-CM

## 2021-02-16 DIAGNOSIS — I200 Unstable angina: Secondary | ICD-10-CM

## 2021-02-16 HISTORY — DX: Malignant neoplasm of unspecified part of unspecified bronchus or lung: C34.90

## 2021-02-16 LAB — TROPONIN I (HIGH SENSITIVITY)
Troponin I (High Sensitivity): 3 ng/L (ref <18)
Troponin I (High Sensitivity): 3 ng/L (ref <18)
Troponin I (High Sensitivity): 3 ng/L (ref <18)

## 2021-02-16 LAB — CBC
HCT: 40.2 % (ref 36.0–46.0)
Hemoglobin: 12.9 g/dL (ref 12.0–15.0)
MCH: 31.1 pg (ref 26.0–34.0)
MCHC: 32.1 g/dL (ref 30.0–36.0)
MCV: 96.9 fL (ref 80.0–100.0)
Platelets: 195 10*3/uL (ref 150–400)
RBC: 4.15 MIL/uL (ref 3.87–5.11)
RDW: 12.3 % (ref 11.5–15.5)
WBC: 4 10*3/uL (ref 4.0–10.5)
nRBC: 0 % (ref 0.0–0.2)

## 2021-02-16 LAB — BASIC METABOLIC PANEL
Anion gap: 7 (ref 5–15)
BUN: 13 mg/dL (ref 8–23)
CO2: 28 mmol/L (ref 22–32)
Calcium: 9.6 mg/dL (ref 8.9–10.3)
Chloride: 104 mmol/L (ref 98–111)
Creatinine, Ser: 1.21 mg/dL — ABNORMAL HIGH (ref 0.44–1.00)
GFR, Estimated: 47 mL/min — ABNORMAL LOW (ref >60)
Glucose, Bld: 114 mg/dL — ABNORMAL HIGH (ref 70–99)
Potassium: 4 mmol/L (ref 3.5–5.1)
Sodium: 139 mmol/L (ref 135–145)

## 2021-02-16 LAB — LIPID PANEL
Cholesterol: 181 mg/dL (ref 0–200)
HDL: 82 mg/dL (ref >40)
LDL Cholesterol: 84 mg/dL (ref 0–99)
Total CHOL/HDL Ratio: 2.2 RATIO
Triglycerides: 73 mg/dL (ref <150)
VLDL: 15 mg/dL (ref 0–40)

## 2021-02-16 LAB — CBC WITH DIFFERENTIAL/PLATELET
Abs Immature Granulocytes: 0.01 10*3/uL (ref 0.00–0.07)
Basophils Absolute: 0 10*3/uL (ref 0.0–0.1)
Basophils Relative: 1 %
Eosinophils Absolute: 0.1 10*3/uL (ref 0.0–0.5)
Eosinophils Relative: 3 %
HCT: 42.8 % (ref 36.0–46.0)
Hemoglobin: 13.6 g/dL (ref 12.0–15.0)
Immature Granulocytes: 0 %
Lymphocytes Relative: 48 %
Lymphs Abs: 1.6 10*3/uL (ref 0.7–4.0)
MCH: 30.7 pg (ref 26.0–34.0)
MCHC: 31.8 g/dL (ref 30.0–36.0)
MCV: 96.6 fL (ref 80.0–100.0)
Monocytes Absolute: 0.3 10*3/uL (ref 0.1–1.0)
Monocytes Relative: 10 %
Neutro Abs: 1.3 10*3/uL — ABNORMAL LOW (ref 1.7–7.7)
Neutrophils Relative %: 38 %
Platelets: 194 10*3/uL (ref 150–400)
RBC: 4.43 MIL/uL (ref 3.87–5.11)
RDW: 12.3 % (ref 11.5–15.5)
WBC: 3.4 10*3/uL — ABNORMAL LOW (ref 4.0–10.5)
nRBC: 0 % (ref 0.0–0.2)

## 2021-02-16 LAB — COMPREHENSIVE METABOLIC PANEL
ALT: 12 U/L (ref 0–44)
AST: 12 U/L — ABNORMAL LOW (ref 15–41)
Albumin: 3.8 g/dL (ref 3.5–5.0)
Alkaline Phosphatase: 69 U/L (ref 38–126)
Anion gap: 7 (ref 5–15)
BUN: 9 mg/dL (ref 8–23)
CO2: 29 mmol/L (ref 22–32)
Calcium: 9.5 mg/dL (ref 8.9–10.3)
Chloride: 104 mmol/L (ref 98–111)
Creatinine, Ser: 1.09 mg/dL — ABNORMAL HIGH (ref 0.44–1.00)
GFR, Estimated: 53 mL/min — ABNORMAL LOW (ref >60)
Glucose, Bld: 109 mg/dL — ABNORMAL HIGH (ref 70–99)
Potassium: 3.7 mmol/L (ref 3.5–5.1)
Sodium: 140 mmol/L (ref 135–145)
Total Bilirubin: 0.8 mg/dL (ref 0.3–1.2)
Total Protein: 6.5 g/dL (ref 6.5–8.1)

## 2021-02-16 LAB — HEMOGLOBIN A1C
Hgb A1c MFr Bld: 6 % — ABNORMAL HIGH (ref 4.8–5.6)
Mean Plasma Glucose: 125.5 mg/dL

## 2021-02-16 LAB — RESP PANEL BY RT-PCR (FLU A&B, COVID) ARPGX2
Influenza A by PCR: NEGATIVE
Influenza B by PCR: NEGATIVE
SARS Coronavirus 2 by RT PCR: NEGATIVE

## 2021-02-16 LAB — MAGNESIUM: Magnesium: 1.9 mg/dL (ref 1.7–2.4)

## 2021-02-16 LAB — HEPATIC FUNCTION PANEL
ALT: 13 U/L (ref 0–44)
AST: 16 U/L (ref 15–41)
Albumin: 3.7 g/dL (ref 3.5–5.0)
Alkaline Phosphatase: 68 U/L (ref 38–126)
Bilirubin, Direct: 0.3 mg/dL — ABNORMAL HIGH (ref 0.0–0.2)
Indirect Bilirubin: 0.6 mg/dL (ref 0.3–0.9)
Total Bilirubin: 0.9 mg/dL (ref 0.3–1.2)
Total Protein: 6.3 g/dL — ABNORMAL LOW (ref 6.5–8.1)

## 2021-02-16 LAB — LIPASE, BLOOD: Lipase: 22 U/L (ref 11–51)

## 2021-02-16 MED ORDER — ISOSORBIDE MONONITRATE ER 30 MG PO TB24
120.0000 mg | ORAL_TABLET | Freq: Every day | ORAL | Status: DC
  Administered 2021-02-16: 120 mg via ORAL
  Filled 2021-02-16: qty 4

## 2021-02-16 MED ORDER — HYDRALAZINE HCL 20 MG/ML IJ SOLN: 10.0000 mg | Freq: Four times a day (QID) | INTRAMUSCULAR | Status: DC | PRN

## 2021-02-16 MED ORDER — METOPROLOL TARTRATE 25 MG PO TABS
25.0000 mg | ORAL_TABLET | Freq: Two times a day (BID) | ORAL | Status: DC
  Administered 2021-02-16: 25 mg via ORAL
  Filled 2021-02-16: qty 1

## 2021-02-16 MED ORDER — UMECLIDINIUM BROMIDE 62.5 MCG/ACT IN AEPB
1.0000 | INHALATION_SPRAY | Freq: Every day | RESPIRATORY_TRACT | Status: DC
  Filled 2021-02-16: qty 7

## 2021-02-16 MED ORDER — PREGABALIN 25 MG PO CAPS: 50.0000 mg | ORAL_CAPSULE | Freq: Every evening | ORAL | Status: DC | PRN

## 2021-02-16 MED ORDER — NITROGLYCERIN 0.4 MG SL SUBL: 0.4000 mg | SUBLINGUAL_TABLET | SUBLINGUAL | Status: DC | PRN

## 2021-02-16 MED ORDER — AMLODIPINE BESYLATE 5 MG PO TABS
10.0000 mg | ORAL_TABLET | Freq: Every day | ORAL | Status: DC
  Administered 2021-02-16: 10 mg via ORAL
  Filled 2021-02-16: qty 2

## 2021-02-16 MED ORDER — LISINOPRIL 10 MG PO TABS
10.0000 mg | ORAL_TABLET | Freq: Every day | ORAL | Status: DC
  Administered 2021-02-16: 10 mg via ORAL
  Filled 2021-02-16: qty 1

## 2021-02-16 MED ORDER — FUROSEMIDE 20 MG PO TABS
20.0000 mg | ORAL_TABLET | Freq: Every day | ORAL | Status: DC
  Administered 2021-02-16: 20 mg via ORAL
  Filled 2021-02-16: qty 1

## 2021-02-16 MED ORDER — ATORVASTATIN CALCIUM 10 MG PO TABS
10.0000 mg | ORAL_TABLET | Freq: Every day | ORAL | Status: DC
  Administered 2021-02-16: 10 mg via ORAL
  Filled 2021-02-16: qty 1

## 2021-02-16 MED ORDER — TIOTROPIUM BROMIDE MONOHYDRATE 1.25 MCG/ACT IN AERS: 2.0000 | INHALATION_SPRAY | Freq: Every day | RESPIRATORY_TRACT | Status: DC

## 2021-02-16 MED ORDER — IPRATROPIUM-ALBUTEROL 0.5-2.5 (3) MG/3ML IN SOLN: 3.0000 mL | RESPIRATORY_TRACT | Status: DC | PRN

## 2021-02-16 MED ORDER — ACETAMINOPHEN 650 MG RE SUPP: 650.0000 mg | Freq: Four times a day (QID) | RECTAL | Status: DC | PRN

## 2021-02-16 MED ORDER — POLYETHYLENE GLYCOL 3350 17 G PO PACK: 17.0000 g | PACK | Freq: Every day | ORAL | Status: DC | PRN

## 2021-02-16 MED ORDER — CLOPIDOGREL BISULFATE 75 MG PO TABS
75.0000 mg | ORAL_TABLET | Freq: Every day | ORAL | Status: DC
  Administered 2021-02-16: 75 mg via ORAL
  Filled 2021-02-16: qty 1

## 2021-02-16 MED ORDER — ISOSORBIDE MONONITRATE ER 60 MG PO TB24: 60.0000 mg | ORAL_TABLET | Freq: Two times a day (BID) | ORAL | 6 refills | Status: DC

## 2021-02-16 MED ORDER — ACETAMINOPHEN 325 MG PO TABS: 650.0000 mg | ORAL_TABLET | Freq: Four times a day (QID) | ORAL | Status: DC | PRN

## 2021-02-16 MED ORDER — NITROGLYCERIN 0.4 MG SL SUBL: 0.4000 mg | SUBLINGUAL_TABLET | SUBLINGUAL | 11 refills | Status: DC | PRN

## 2021-02-16 MED ORDER — ENOXAPARIN SODIUM 40 MG/0.4ML IJ SOSY
40.0000 mg | PREFILLED_SYRINGE | INTRAMUSCULAR | Status: DC
  Administered 2021-02-16: 40 mg via SUBCUTANEOUS
  Filled 2021-02-16: qty 0.4

## 2021-02-16 MED ORDER — RANOLAZINE ER 500 MG PO TB12
500.0000 mg | ORAL_TABLET | Freq: Two times a day (BID) | ORAL | Status: DC
  Administered 2021-02-16: 500 mg via ORAL
  Filled 2021-02-16 (×2): qty 1

## 2021-02-16 MED ORDER — OXYCODONE-ACETAMINOPHEN 5-325 MG PO TABS: 1.5000 | ORAL_TABLET | ORAL | Status: DC | PRN

## 2021-02-16 NOTE — ED Provider Notes (Signed)
Emergency Department Provider Note   I have reviewed the triage vital signs and the nursing notes.   HISTORY  Chief Complaint Chest Pain   HPI Karen [name] is a 75 y.o. female with PMH reviewed below including CAD s/p CABG, HTN, VF arrest with AICD presents to the emergency department with intermittent chest tightness over the past 48 hours.  Patient notes severe tightness in the center of her chest which worsens with exertion.  She is not feeling short of breath or having fevers.  No productive cough.  She states this does feel similar to her prior pain attributed to CAD.  That she has been taking her nitroglycerin at home which does improve symptoms temporarily but then pain returns.  Today, she took her nitro more than 3 times which prompted her to call EMS.  They gave additional nitroglycerin upon their arrival and her chest pain has currently resolved. She is compliant with her home medications including Imdur and Ranexa.   Past Medical History:  Diagnosis Date   Acute myocardial infarction, unspecified site, episode of care unspecified    Allergic rhinitis, cause unspecified    CAD (coronary artery disease)    GERD (gastroesophageal reflux disease)    Headache(784.0)    HTN (hypertension)    Other and unspecified hyperlipidemia    Other diseases of lung, not elsewhere classified    Postsurgical aortocoronary bypass status    hx of it.     Patient Active Problem List   Diagnosis Date Noted   Chest pain 02/16/2021   Claudication ([place]) 02/03/2018   Right carotid bruit 02/03/2018   Angina pectoris ([place]) 01/02/2016   Old MI (myocardial infarction) 12/27/2014   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   Tobacco use 10/26/2009   COPD (chronic obstructive pulmonary disease) ([place]) 10/26/2009   VENTRICULAR FIBRILLATION 10/16/2008   Coronary artery disease-prior MI  s/p CABG 07/27/2007   Pulmonary nodules/lesions, multiple 07/27/2007   HEADACHE,  CHRONIC 07/27/2007   Hyperlipidemia 07/24/2007   Essential hypertension 07/24/2007   G E R D 07/24/2007    Past Surgical History:  Procedure Laterality Date   CARDIAC DEFIBRILLATOR PLACEMENT  03/2006   Guidant. remote-yes.    CORONARY ARTERY BYPASS GRAFT     x4   IMPLANTABLE CARDIOVERTER DEFIBRILLATOR GENERATOR CHANGE N/A 03/03/2013   Procedure: IMPLANTABLE CARDIOVERTER DEFIBRILLATOR GENERATOR CHANGE;  Surgeon: Deboraha Sprang, MD;  Location: [facility] CATH LAB;  Service: Cardiovascular;  Laterality: N/A;   stent implant     x1. possibly x2.     Allergies Codeine and Percocet [oxycodone-acetaminophen]  Family History  Problem Relation Age of Onset   Heart attack Mother    Heart disease Mother    Breast cancer Maternal Aunt     Social History Social History   Tobacco Use   Smoking status: Some Days    Packs/day: 0.25    Years: 54.00    Pack years: 13.50    Types: Cigarettes    Start date: 1968   Smokeless tobacco: Never   Tobacco comments:    States she only smokes some when she gets nervous as of 01/02/21  Vaping Use   Vaping Use: Never used  Substance Use Topics   Alcohol use: No   Drug use: No    Review of Systems  Constitutional: No fever/chills Eyes: No visual changes. ENT: No sore throat. Cardiovascular: Positive chest pain. Respiratory: Denies shortness of breath. Gastrointestinal: No abdominal pain.  No nausea, no vomiting.  No diarrhea.  No constipation. Genitourinary: Negative for dysuria. Musculoskeletal: Negative for back pain. Skin: Negative for rash. Neurological: Negative for headaches, focal weakness or numbness.  10-point ROS otherwise negative.  ____________________________________________   PHYSICAL EXAM:  VITAL SIGNS: ED Triage Vitals  Enc Vitals Group     BP 02/15/21 2244 134/76     Pulse Rate 02/15/21 2244 77     Resp 02/15/21 2244 16     Temp 02/15/21 2244 98.4 F (36.9 C)     Temp Source 02/15/21 2244 Oral     SpO2 02/15/21 2244  100 %     Weight 02/15/21 2237 157 lb (71.2 kg)     Height 02/15/21 2237 5\' 3"  (1.6 m)   Constitutional: Alert and oriented. Well appearing and in no acute distress. Eyes: Conjunctivae are normal. Head: Atraumatic. Nose: No congestion/rhinnorhea. Mouth/Throat: Mucous membranes are moist.   Neck: No stridor. Cardiovascular: Normal rate, regular rhythm. Good peripheral circulation. Grossly normal heart sounds.   Respiratory: Normal respiratory effort.  No retractions. Lungs CTAB. Gastrointestinal: Soft and nontender. No distention.  Musculoskeletal: No lower extremity tenderness nor edema. No gross deformities of extremities. Neurologic:  Normal speech and language. No gross focal neurologic deficits are appreciated.  Skin:  Skin is warm, dry and intact. No rash noted.  ____________________________________________   LABS (all labs ordered are listed, but only abnormal results are displayed)  Labs Reviewed  BASIC METABOLIC PANEL - Abnormal; Notable for the following components:      Result Value   Glucose, Bld 114 (*)    Creatinine, Ser 1.21 (*)    GFR, Estimated 47 (*)    All other components within normal limits  HEPATIC FUNCTION PANEL - Abnormal; Notable for the following components:   Total Protein 6.3 (*)    Bilirubin, Direct 0.3 (*)    All other components within normal limits  RESP PANEL BY RT-PCR (FLU A&B, COVID) ARPGX2  CBC  LIPASE, BLOOD  TROPONIN I (HIGH SENSITIVITY)  TROPONIN I (HIGH SENSITIVITY)   ____________________________________________  EKG  NSR, Nonspecific ST changes similar to 2014. Normal axis. No ST elevation/depression. No STEMI.    ____________________________________________  RADIOLOGY  DG Chest Portable 1 View  Result Date: 02/16/2021 CLINICAL DATA:  Chest pain, history of lung carcinoma EXAM: PORTABLE CHEST 1 VIEW COMPARISON:  01/26/2021 FINDINGS: Cardiac shadow is stable. Defibrillator in postoperative changes are again seen and stable.  The lungs are well aerated bilaterally. No focal infiltrate or sizable effusion is seen. No bony abnormality is seen. IMPRESSION: No acute abnormality noted. Known nodular changes on prior CT are not well appreciated on this exam. Electronically Signed   By: Inez Catalina M.D.   On: 02/16/2021 02:55    ____________________________________________   PROCEDURES  Procedure(s) performed:   Procedures  None  ____________________________________________   INITIAL IMPRESSION / ASSESSMENT AND PLAN / ED COURSE  Pertinent labs & imaging results that were available during my care of the patient were reviewed by me and considered in my medical decision making (see chart for details).   Presents emergency department with intermittent chest discomfort mainly with exertion over the past 2 days requiring multiple doses of nitroglycerin.  She is currently chest pain-free.  She has a significant coronary artery disease history including prior CABG and V. fib arrest with AICD in place.  No AICD firing.  Patient is compliant with her home medications.  EKG interpreted by me as above which appears similar to prior tracings in her system.   Differential includes  all life-threatening causes for chest pain. This includes but is not exclusive to acute coronary syndrome, aortic dissection, pulmonary embolism, cardiac tamponade, community-acquired pneumonia, pericarditis, musculoskeletal chest wall pain, etc.  Initial troponin is negative. Have added LFTs and lipase although CP does not seem GI in nature clinically. CXR with no infiltrate or other acute finding. Plan for second troponin and likely admit for further risk stratification in this high risk patient.   Spoke with Dr. Humphrey Rolls with Cardiology. AM team to consult and provide further guidance on additional provocative testing.   Discussed patient's case with [facility] to request admission. Patient and family (if present) updated with plan. Care transferred to [facility]  service.  I reviewed all nursing notes, vitals, pertinent old records, EKGs, labs, imaging (as available).  ____________________________________________  FINAL CLINICAL IMPRESSION(S) / ED DIAGNOSES  Final diagnoses:  Precordial chest pain     Note:  This document was prepared using Dragon voice recognition software and may include unintentional dictation errors.  Nanda Quinton, MD, [facility] Emergency Medicine    Brendon Christoffel, [facility], MD 02/16/21 0530

## 2021-02-16 NOTE — Assessment & Plan Note (Addendum)
   CE negative  Outpatient stress test  imdur changed

## 2021-02-16 NOTE — Assessment & Plan Note (Signed)
Smoking cessation was discussed with patient in detail He declines a nicotine transdermal patch at this time 

## 2021-02-16 NOTE — Assessment & Plan Note (Signed)
   Recent diagnosis in September  Follows with Dr. Julien Nordmann with hematology oncology  Currently being treated with radiation therapy   Continue close outpatient follow-up

## 2021-02-16 NOTE — Discharge Summary (Signed)
Physician Discharge Summary   Patient name: Karen [name]  Admit date:     02/15/2021  Discharge date: 02/16/2021  Discharge Physician: Geradine Girt   PCP: [facility]   Recommendations at discharge: outpatient stress test  Discharge Diagnoses Principal Problem:   Chest pain Active Problems:   Mixed hyperlipidemia   Essential hypertension   Coronary artery disease involving native heart with angina pectoris (HCC)   GERD without esophagitis   COPD (chronic obstructive pulmonary disease) (HCC)   Nicotine dependence, cigarettes, uncomplicated   Adenocarcinoma, lung [facility])    Hospital Course   No notes on file   * Chest pain CE negative Outpatient stress test imdur changed  Adenocarcinoma, lung ([place]) Recent diagnosis in September Follows with Dr. Julien Nordmann with hematology oncology Currently being treated with radiation therapy  Continue close outpatient follow-up  Nicotine dependence, cigarettes, uncomplicated Patient is being counseled daily on smoking cessation.   COPD (chronic obstructive pulmonary disease) (HCC) No clinical evidence of COPD exacerbation this time As needed bronchodilator therapy for shortness of breath and wheezing.   GERD without esophagitis Continuing home regimen of daily PPI therapy.   Coronary artery disease involving native heart with angina pectoris (HCC) CE negative Outpatient stress test  Essential hypertension Resume patients home regimen of oral antihypertensives With changes noted by cardiology    Mixed hyperlipidemia Continuing home regimen of lipid lowering therapy.        Condition at discharge: good   Disposition: Home  Discharge time: less than 30 minutes.  Follow-up Information     Jettie Booze, MD Follow up.   Specialties: Cardiology, Radiology, Interventional Cardiology Why: Office will call with date and time of appointment and stress test.  Please give [place] a call back if no  reply in 4 to 5 days. Contact information: [address] [place] [address] [facility] Follow up in 1 week(s).   Contact information: [place] [address] [phone]         Deboraha Sprang, MD .   Specialty: Cardiology Contact information: [phone] [place] [address] [phone]                 Allergies as of 02/16/2021       Reactions   Codeine Itching, Swelling   Percocet [oxycodone-acetaminophen] Hives, Itching        Medication List     TAKE these medications    acetaminophen 325 MG tablet Commonly known as: Tylenol Take 2 tablets (650 mg total) by mouth every 6 (six) hours as needed.   amLODipine 10 MG tablet Commonly known as: NORVASC TAKE 1 TABLET(10 MG) BY MOUTH DAILY What changed: See the new instructions.   atorvastatin 10 MG tablet Commonly known as: LIPITOR TAKE 1 TABLET(10 MG) BY MOUTH DAILY What changed: See the new instructions.   clopidogrel 75 MG tablet Commonly known as: PLAVIX TAKE 1 TABLET(75 MG) BY MOUTH DAILY What changed: See the new instructions.   cyclobenzaprine 10 MG tablet Commonly known as: FLEXERIL Take 10 mg by mouth 3 (three) times daily as needed for muscle spasms.   furosemide 20 MG tablet Commonly known as: LASIX TAKE 1 TABLET(20 MG) BY MOUTH DAILY What changed: See the new instructions.   isosorbide mononitrate 60 MG 24 hr tablet Commonly known as: IMDUR Take 1 tablet (60 mg total) by mouth 2 (two) times daily.  What changed:  medication strength See the new instructions.   lisinopril 10 MG tablet Commonly known as: ZESTRIL TAKE 1 TABLET(10 MG) BY MOUTH DAILY What changed: See the new instructions.   metoprolol tartrate 25 MG tablet Commonly known as: LOPRESSOR TAKE 1 TABLET BY MOUTH TWICE DAILY What changed:  how much to take how to take this when to take this additional  instructions   nitroGLYCERIN 0.4 MG SL tablet Commonly known as: NITROSTAT Place 1 tablet (0.4 mg total) under the tongue every 5 (five) minutes as needed for chest pain (X3 DOSES MAX).   oxyCODONE-acetaminophen 7.5-325 MG tablet Commonly known as: PERCOCET Take 1-2 tablets by mouth every 4 (four) hours as needed for moderate pain (back pain).   pregabalin 50 MG capsule Commonly known as: LYRICA Take 50 mg by mouth at bedtime as needed (leg pain).   ranolazine 500 MG 12 hr tablet Commonly known as: RANEXA TAKE 1 TABLET BY MOUTH TWICE DAILY   Spiriva Respimat 1.25 MCG/ACT Aers Generic drug: Tiotropium Bromide Monohydrate Inhale 2 puffs into the lungs daily. What changed:  when to take this reasons to take this        CT [place] (5MM)  Result Date: 01/27/2021 CLINICAL DATA:  Non-small cell lung cancer staging. Headaches for 3 months. Blurry vision and imbalance. EXAM: CT HEAD WITHOUT AND WITH CONTRAST TECHNIQUE: Contiguous axial images were obtained from the base of the skull through the vertex without and with intravenous contrast CONTRAST:  71mL OMNIPAQUE IOHEXOL 350 MG/ML SOLN COMPARISON:  None. FINDINGS: Brain: There is no evidence of an acute infarct, intracranial hemorrhage, mass, midline shift, or extra-axial fluid collection. The ventricles and sulci are normal. No abnormal enhancement is identified. Vascular: Calcified atherosclerosis at the skull base. The major dural venous sinuses and large arteries at the base the brain are grossly patent on this nondedicated study. Skull: No fracture or suspicious osseous lesion. Sinuses/Orbits: Visualized paranasal sinuses and mastoid air cells are clear. Unremarkable orbits. Other: None. IMPRESSION: No evidence of intracranial metastatic disease. Electronically Signed   By: Logan Bores M.D.   On: 01/27/2021 17:35   CT Chest W Contrast  Result Date: 01/29/2021 CLINICAL DATA:  Non-small-cell lung cancer.  Restaging. EXAM:  CT CHEST WITH CONTRAST TECHNIQUE: Multidetector CT imaging of the chest was performed during intravenous contrast administration. CONTRAST:  69mL OMNIPAQUE IOHEXOL 350 MG/ML SOLN COMPARISON:  PET-CT 12/08/2020 FINDINGS: Cardiovascular: The heart size is normal. No substantial pericardial effusion. Coronary artery calcification is evident. Status post CABG. Insert calcium thoracic moderate left permanent pacemaker noted. Mediastinum/Nodes: 9 mm left thyroid nodule. Not clinically significant; no follow-up imaging recommended (ref: J Am Coll Radiol. 2015 Feb;12(2): 143-50).No mediastinal lymphadenopathy. There is no hilar lymphadenopathy. The esophagus has normal imaging features. There is no axillary lymphadenopathy. Lungs/Pleura: 14 x 9 mm irregular nodule identified previously in the superior segment left lower lobe is 14 x 9 mm again today. Interval development of a finger-like projection of soft tissue anterior to the lesion suggests small airway impaction. 16 mm peripheral ground-glass nodule identified posterior right lower lobe with tethering to the adjacent pleura. This is similar to prior PET-CT and lung cancer screening CT of 09/14/2020. 6 mm ground-glass nodule in the left upper lobe (44/6) is stable in the interval. No new suspicious pulmonary nodule or mass. No focal airspace consolidation. No pleural effusion. Upper Abdomen: Tiny hypodensity in the lateral right liver is stable, likely benign. Musculoskeletal: No worrisome lytic or sclerotic osseous  abnormality. IMPRESSION: 1. No substantial interval change in the 14 x 9 mm irregular nodule in the superior segment left lower lobe. Interval development of a finger-like projection of soft tissue anterior to the lesion suggests small airway impaction. 2. Stable 16 mm peripheral ground-glass nodule posterior right lower lobe with tethering to the adjacent pleura. Adenocarcinoma could have this appearance. Continued close attention on follow-up recommended.  3. Stable 6 mm ground-glass nodule in the left upper lobe. Attention on follow-up recommended. 4.  Aortic Atherosclerois (ICD10-170.0) Electronically Signed   By: Misty Stanley M.D.   On: 01/29/2021 06:56   DG Chest Portable 1 View  Result Date: 02/16/2021 CLINICAL DATA:  Chest pain, history of lung carcinoma EXAM: PORTABLE CHEST 1 VIEW COMPARISON:  01/26/2021 FINDINGS: Cardiac shadow is stable. Defibrillator in postoperative changes are again seen and stable. The lungs are well aerated bilaterally. No focal infiltrate or sizable effusion is seen. No bony abnormality is seen. IMPRESSION: No acute abnormality noted. Known nodular changes on prior CT are not well appreciated on this exam. Electronically Signed   By: Inez Catalina M.D.   On: 02/16/2021 02:55   Results for orders placed or performed during the hospital encounter of 02/15/21  Resp Panel by RT-PCR (Flu A&B, Covid) Nasopharyngeal Swab     Status: None   Collection Time: 02/16/21  2:43 AM   Specimen: Nasopharyngeal Swab; Nasopharyngeal(NP) swabs in vial transport medium  Result Value Ref Range Status   SARS Coronavirus 2 by RT PCR NEGATIVE NEGATIVE Final    Comment: (NOTE) SARS-CoV-2 target nucleic acids are NOT DETECTED.  The SARS-CoV-2 RNA is generally detectable in upper respiratory specimens during the acute phase of infection. The lowest concentration of SARS-CoV-2 viral copies this assay can detect is 138 copies/mL. A negative result does not preclude SARS-Cov-2 infection and should not be used as the sole basis for treatment or other patient management decisions. A negative result may occur with  improper specimen collection/handling, submission of specimen other than nasopharyngeal swab, presence of viral mutation(s) within the areas targeted by this assay, and inadequate number of viral copies(<138 copies/mL). A negative result must be combined with clinical observations, patient history, and epidemiological information.  The expected result is Negative.  Fact Sheet for Patients:  EntrepreneurPulse.com.au  Fact Sheet for Healthcare Providers:  IncredibleEmployment.be  This test is no t yet approved or cleared by the [place] FDA and  has been authorized for detection and/or diagnosis of SARS-CoV-2 by FDA under an Emergency Use Authorization (EUA). This EUA will remain  in effect (meaning this test can be used) for the duration of the COVID-19 declaration under Section 564(b)(1) of the Act, 21 U.S.C.section 360bbb-3(b)(1), unless the authorization is terminated  or revoked sooner.       Influenza A by PCR NEGATIVE NEGATIVE Final   Influenza B by PCR NEGATIVE NEGATIVE Final    Comment: (NOTE) The Xpert Xpress SARS-CoV-2/FLU/RSV plus assay is intended as an aid in the diagnosis of influenza from Nasopharyngeal swab specimens and should not be used as a sole basis for treatment. Nasal washings and aspirates are unacceptable for Xpert Xpress SARS-CoV-2/FLU/RSV testing.  Fact Sheet for Patients: EntrepreneurPulse.com.au  Fact Sheet for Healthcare Providers: IncredibleEmployment.be  This test is not yet approved or cleared by the [place] FDA and has been authorized for detection and/or diagnosis of SARS-CoV-2 by FDA under an Emergency Use Authorization (EUA). This EUA will remain in effect (meaning this test can be used) for the duration of  the COVID-19 declaration under Section 564(b)(1) of the Act, 21 U.S.C. section 360bbb-3(b)(1), unless the authorization is terminated or revoked.  Performed at [facility], [place] [address] [place], [place] [address]     Signed:  Geradine Girt DO [facility] 02/16/2021, 4:00 PM

## 2021-02-16 NOTE — H&P (Signed)
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  Patient explains that she has been experiencing episodes of chest discomfort since she woke up from sleep morning of 10/20.    Patient explains episodes of chest discomfort can occur at rest or with exertion.  Episodes of chest discomfort are midsternal in location, pressure-like in quality and moderate in intensity.  Patient has experienced 3-4 episodes throughout the course of the day but each episode seeming to resolve with nitroglycerin.  Patient denies any associated cough, shortness of breath, peripheral edema or fever.  For over the past 24 hours she has been experiencing episodic chest discomfort.  Upon further questioning patient states that this chest discomfort feels similar to her previous myocardial infarction.   Patient reports that she is compliant with all of her medications including Imdur and Ranexa.  Patient denies cough, shortness of breath, fevers, sick contacts, recent travel or contact with confirmed COVID-19 infection.  Due to patient's recurrent bouts of chest discomfort despite having taken nitroglycerin the patient eventually contacted EMS who promptly came to  evaluate the patient and brought the patient into [facility] emergency department for evaluation.  Upon evaluation in the emergency department 2 sets of troponins were obtained both of which were unremarkable.  EKG revealed no evidence of dynamic ST segment change.  Patient remained chest pain-free.  Due to patient's concerning episodes of chest discomfort prior to arrival and advanced cardiac history hospitalist group was called to assess the patient for admission to the hospital.  ER provider additionally has been attempting to get a hold of the overnight cardiology fellow to request consultation.  Review of Systems:   Review of Systems  Cardiovascular:  Positive for chest pain.  All other systems reviewed and are negative.  Past Medical History:  Diagnosis Date   Acute myocardial infarction, unspecified site, episode of care unspecified    Adenocarcinoma, lung ([place]) 02/16/2021   Allergic rhinitis, cause unspecified    CAD (coronary artery disease)    GERD (gastroesophageal reflux disease)    Headache(784.0)    HTN (hypertension)    Other and unspecified hyperlipidemia    Other diseases of lung, not elsewhere classified    Postsurgical aortocoronary bypass status    hx of it.     Past Surgical History:  Procedure Laterality Date   CARDIAC DEFIBRILLATOR PLACEMENT  03/2006   Guidant. remote-yes.    CORONARY ARTERY BYPASS GRAFT     x4   IMPLANTABLE CARDIOVERTER DEFIBRILLATOR GENERATOR CHANGE N/A 03/03/2013   Procedure: IMPLANTABLE CARDIOVERTER DEFIBRILLATOR GENERATOR CHANGE;  Surgeon: Deboraha Sprang, MD;  Location: [facility] CATH LAB;  Service: Cardiovascular;  Laterality: N/A;   stent implant     x1. possibly x2.  reports that she has been smoking cigarettes. She started smoking about 54 years ago. She has a 13.50 pack-year smoking history. She has never used smokeless tobacco. She reports that she does not drink alcohol and does not use drugs.  Allergies  Allergen  Reactions   Codeine Itching and Swelling   Percocet [Oxycodone-Acetaminophen] Hives and Itching    Family History  Problem Relation Age of Onset   Heart attack Mother    Heart disease Mother    Breast cancer Maternal Aunt      Prior to Admission medications   Medication Sig Start Date End Date Taking? Authorizing Provider  acetaminophen (TYLENOL) 325 MG tablet Take 2 tablets (650 mg total) by mouth every 6 (six) hours as needed. 11/06/16   Varney Biles, MD  amLODipine (NORVASC) 10 MG tablet TAKE 1 TABLET(10 MG) BY MOUTH DAILY 01/22/21   Jettie Booze, MD  atorvastatin (LIPITOR) 10 MG tablet TAKE 1 TABLET(10 MG) BY MOUTH DAILY 10/25/20   Jettie Booze, MD  clopidogrel (PLAVIX) 75 MG tablet TAKE 1 TABLET(75 MG) BY MOUTH DAILY 10/25/20   Jettie Booze, MD  furosemide (LASIX) 20 MG tablet TAKE 1 TABLET(20 MG) BY MOUTH DAILY 01/31/20   Imogene Burn, PA-C  isosorbide mononitrate (IMDUR) 120 MG 24 hr tablet TAKE 1 TABLET(120 MG) BY MOUTH DAILY 10/25/20   Jettie Booze, MD  lisinopril (ZESTRIL) 10 MG tablet TAKE 1 TABLET(10 MG) BY MOUTH DAILY 01/22/21   Jettie Booze, MD  metoprolol tartrate (LOPRESSOR) 25 MG tablet TAKE 1 TABLET BY MOUTH TWICE DAILY 10/05/20   Jettie Booze, MD  nitroGLYCERIN (NITROSTAT) 0.4 MG SL tablet Place 1 tablet (0.4 mg total) under the tongue every 5 (five) minutes as needed for chest pain (X3 DOSES MAX). 09/06/20   Jettie Booze, MD  oxyCODONE-acetaminophen (PERCOCET) 7.5-325 MG tablet oxycodone-acetaminophen 7.5 mg-325 mg tablet    [provider]  ranolazine (RANEXA) 500 MG 12 hr tablet TAKE 1 TABLET BY MOUTH TWICE DAILY 09/27/20   Jettie Booze, MD  Tiotropium Bromide Monohydrate (SPIRIVA RESPIMAT) 1.25 MCG/ACT AERS Inhale 2 puffs into the lungs daily. 02/06/21   Collene Gobble, MD    Physical Exam: Vitals:   02/16/21 0330 02/16/21 0400 02/16/21 0430 02/16/21 0515  BP: 129/63 125/75 112/63 (!) 122/58   Pulse:  72 71 66  Resp: 19 20 18 17   Temp:      TempSrc:      SpO2:  100% 100% 100%  Weight:      Height:        Constitutional: Awake alert and oriented x3, no associated distress.   Skin: no rashes, no lesions, good skin turgor noted. Eyes: Pupils are equally reactive to light.  No evidence of scleral icterus or conjunctival pallor.  ENMT: Moist mucous membranes noted.  Posterior pharynx clear of any exudate or lesions.   Neck: normal, supple, no masses, no thyromegaly.  No evidence of jugular venous distension.   Respiratory: clear to auscultation bilaterally, no wheezing, no crackles. Normal respiratory effort. No accessory muscle use.  Cardiovascular: Irregularly irregular rate and rhythm, no murmurs / rubs / gallops. No extremity edema. 2+ pedal pulses. No carotid bruits.  Chest:   Notable chest wall tenderness without crepitus or deformity.   Back:   Nontender without crepitus or deformity. Abdomen: Abdomen is soft and nontender.  No evidence of intra-abdominal masses.  Positive bowel sounds noted in all quadrants.   Musculoskeletal: No joint deformity upper  and lower extremities. Good ROM, no contractures. Normal muscle tone.  Neurologic: CN 2-12 grossly intact. Sensation intact.  Patient moving all 4 extremities spontaneously.  Patient is following all commands.  Patient is responsive to verbal stimuli.   Psychiatric: Patient exhibits normal mood with appropriate affect.  Patient seems to possess insight as to their current situation.     Labs on Admission: I have personally reviewed following labs and imaging studies -   CBC: Recent Labs  Lab 02/15/21 2257  WBC 4.0  HGB 12.9  HCT 40.2  MCV 96.9  PLT 834   Basic Metabolic Panel: Recent Labs  Lab 02/15/21 2257  NA 139  K 4.0  CL 104  CO2 28  GLUCOSE 114*  BUN 13  CREATININE 1.21*  CALCIUM 9.6   GFR: Estimated Creatinine Clearance: 38.6 mL/min (A) (by C-G formula based on SCr of 1.21 mg/dL (H)). Liver  Function Tests: Recent Labs  Lab 02/16/21 0310  AST 16  ALT 13  ALKPHOS 68  BILITOT 0.9  PROT 6.3*  ALBUMIN 3.7   Recent Labs  Lab 02/16/21 0310  LIPASE 22   No results for input(s): AMMONIA in the last 168 hours. Coagulation Profile: No results for input(s): INR, PROTIME in the last 168 hours. Cardiac Enzymes: No results for input(s): CKTOTAL, CKMB, CKMBINDEX, TROPONINI in the last 168 hours. BNP (last 3 results) No results for input(s): PROBNP in the last 8760 hours. HbA1C: No results for input(s): HGBA1C in the last 72 hours. CBG: No results for input(s): GLUCAP in the last 168 hours. Lipid Profile: No results for input(s): CHOL, HDL, LDLCALC, TRIG, CHOLHDL, LDLDIRECT in the last 72 hours. Thyroid Function Tests: No results for input(s): TSH, T4TOTAL, FREET4, T3FREE, THYROIDAB in the last 72 hours. Anemia Panel: No results for input(s): VITAMINB12, FOLATE, FERRITIN, TIBC, IRON, RETICCTPCT in the last 72 hours. Urine analysis: No results found for: COLORURINE, APPEARANCEUR, [place], [place], GLUCOSEU, [place], BILIRUBINUR, KETONESUR, PROTEINUR, UROBILINOGEN, NITRITE, LEUKOCYTESUR  Radiological Exams on Admission - Personally Reviewed: DG Chest Portable 1 View  Result Date: 02/16/2021 CLINICAL DATA:  Chest pain, history of lung carcinoma EXAM: PORTABLE CHEST 1 VIEW COMPARISON:  01/26/2021 FINDINGS: Cardiac shadow is stable. Defibrillator in postoperative changes are again seen and stable. The lungs are well aerated bilaterally. No focal infiltrate or sizable effusion is seen. No bony abnormality is seen. IMPRESSION: No acute abnormality noted. Known nodular changes on prior CT are not well appreciated on this exam. Electronically Signed   By: Inez Catalina M.D.   On: 02/16/2021 02:55    EKG: Personally reviewed.  Rhythm is normal sinus rhythm with heart rate of 75 bpm.  Notable T wave inversions in the inferior leads.  No dynamic ST segment changes  appreciated.  Assessment/Plan  * Chest pain Patient presenting with chest discomfort with both typical and atypical features Fact the patient reports symptomatic improvement with as needed nitroglycerin in addition to patient's reports that her chest discomfort feels "just like my last heart attack" are particularly concerning. Initial troponins unremarkable, continuing to cycle cardiac enzymes EKG reveals no dynamic ST segment change Considering known history of extensive disease ER provider has contacted the overnight cardiology fellow to request formal consultation.  Cardiology to determine whether noninvasive ischemic assessment or other diagnostics should be performed during this hospitalization Will keep n.p.o. for now As needed nitroglycerin for further episodes of chest discomfort  Coronary artery disease involving native heart with angina pectoris ([place]) Episodes of chest discomfort responsive to nitroglycerin  concerning for progressive coronary artery disease Continue to monitor patient on telemetry Continuing home regimen of beta-blocker, statin therapy, antiplatelet therapy Monitoring on telemetry  Essential hypertension Resume patients home regimen of oral antihypertensives Titrate antihypertensive regimen as necessary to achieve adequate BP control PRN intravenous antihypertensives for excessively elevated blood pressure    COPD (chronic obstructive pulmonary disease) (HCC) No clinical evidence of COPD exacerbation this time As needed bronchodilator therapy for shortness of breath and wheezing.   Mixed hyperlipidemia Continuing home regimen of lipid lowering therapy.   GERD without esophagitis Continuing home regimen of daily PPI therapy.   Nicotine dependence, cigarettes, uncomplicated Patient is being counseled daily on smoking cessation.   Adenocarcinoma, lung ([place]) Recent diagnosis in September Follows with Dr. Julien Nordmann with hematology oncology Currently  being treated with radiation therapy  Continue close outpatient follow-up      Code Status:  Full code  code status decision has been confirmed with: patient Family Communication: deferred   Status is: Observation  The patient remains OBS appropriate and will d/c before 2 midnights.       Vernelle Emerald MD [facility] Pager [phone]  If 7PM-7AM, please contact night-coverage [email or web address] Use universal [place] password for that web site. If you do not have the password, please call the hospital operator.  02/16/2021, 6:19 AM

## 2021-02-16 NOTE — Consult Note (Addendum)
Cardiology Consultation:   Patient ID: Karen [name] MRN: [id number]; DOB: Jul 21, 1945  Admit date: 02/15/2021 Date of Consult: 02/16/2021  PCP:  [facility] [place] Providers Cardiologist:  Larae Grooms, MD  Electrophysiologist:  Virl Axe, MD  {    Patient Profile:    Karen [name] is a 75 y.o. female with a hx of CAD s/p CABG and PCI, VF arrest s/p AICD and generator change out, hyperlipidemia, COPD and recent diagnosis of adenocarcinoma of left lower lobe via biopsy 01/03/2021 (undergoing radiation therapy, did not felt surgical candidate) who is being seen 02/16/2021 for the evaluation of chest pain at the request of Dr. Cyd Silence.   She had a cardiac arrest in the early 2000. At that time, she had an AICD placed in [place] by Dr. Chuck Hint. In 2007, she had another cardiac arrest at the time of hospitalization. Cardiac cath showed an occluded left main which was angioplastied. She then went to emergency CABG. Hx of cardiogenic shock/ STEMI 07/2006 secondary to occluded left main s/p angioplasty of left main and LAD. Last cath 10/2006 showed patent LIMA to LAD and patent SVG to RCA.  Occluded SVG to OM 2.  She was medically managed.  Last stress test in 2015 was low risk.  Echocardiogram in 2017 showed normal LV function without regional wall motion abnormality.  History of Present Illness:   Ms. [name] was in usual state of health until 3 days ago when started to having substernal chest pressure.  It was intermittent and occasionally associated with shortness of breath and diaphoresis.  He took sublingual nitroglycerin, 1 nitro each time, with resolution of pain but recur.  Continues to have pain over next 2 days requiring nitroglycerin intermittently.  He had a worse episode last night.  She took nitroglycerin with minimal improvement.  Then she took Imdur 120 mg and pain eventually resolved.  She was also given sublingual nitroglycerin in route.  She is  chest pain-free since this morning.  Troponin negative x3.  Serum creatinine 1.21 >>1.09.  Potassium 3.7.  LDL 84.  Hemoglobin A1c 6.0.  He denies dizziness, palpitation, orthopnea, PND, syncope, lower extremity edema or melena.  No regular exercise.  She only does household chores.   Past Medical History:  Diagnosis Date   Acute myocardial infarction, unspecified site, episode of care unspecified    Adenocarcinoma, lung ([place]) 02/16/2021   Allergic rhinitis, cause unspecified    CAD (coronary artery disease)    GERD (gastroesophageal reflux disease)    Headache(784.0)    HTN (hypertension)    Other and unspecified hyperlipidemia    Other diseases of lung, not elsewhere classified    Postsurgical aortocoronary bypass status    hx of it.     Past Surgical History:  Procedure Laterality Date   CARDIAC DEFIBRILLATOR PLACEMENT  03/2006   Guidant. remote-yes.    CORONARY ARTERY BYPASS GRAFT     x4   IMPLANTABLE CARDIOVERTER DEFIBRILLATOR GENERATOR CHANGE N/A 03/03/2013   Procedure: IMPLANTABLE CARDIOVERTER DEFIBRILLATOR GENERATOR CHANGE;  Surgeon: Deboraha Sprang, MD;  Location: [facility] CATH LAB;  Service: Cardiovascular;  Laterality: N/A;   stent implant     x1. possibly x2.     Inpatient Medications: Scheduled Meds:  amLODipine  10 mg Oral Daily   atorvastatin  10 mg Oral Daily   clopidogrel  75 mg Oral Daily   enoxaparin (LOVENOX) injection  40 mg Subcutaneous Q24H   furosemide  20 mg Oral Daily  isosorbide mononitrate  120 mg Oral Daily   lisinopril  10 mg Oral Daily   metoprolol tartrate  25 mg Oral BID   ranolazine  500 mg Oral BID   umeclidinium bromide  1 puff Inhalation Daily   Continuous Infusions:  PRN Meds: acetaminophen **OR** acetaminophen, hydrALAZINE, ipratropium-albuterol, nitroGLYCERIN, oxyCODONE-acetaminophen, polyethylene glycol, pregabalin  Allergies:    Allergies  Allergen Reactions   Codeine Itching and Swelling   Percocet [Oxycodone-Acetaminophen] Hives  and Itching    Social History:   Social History   Socioeconomic History   Marital status: Legally Separated    Spouse name: Not on file   Number of children: Not on file   Years of education: Not on file   Highest education level: Not on file  Occupational History   Not on file  Tobacco Use   Smoking status: Some Days    Packs/day: 0.25    Years: 54.00    Pack years: 13.50    Types: Cigarettes    Start date: 1968   Smokeless tobacco: Never   Tobacco comments:    States she only smokes some when she gets nervous as of 01/02/21  Vaping Use   Vaping Use: Never used  Substance and Sexual Activity   Alcohol use: No   Drug use: No   Sexual activity: Not on file  Other Topics Concern   Not on file  Social History Narrative   Married, 4 children.    Social Determinants of Health   Financial Resource Strain: Not on file  Food Insecurity: Not on file  Transportation Needs: Not on file  Physical Activity: Not on file  Stress: Not on file  Social Connections: Not on file  Intimate Partner Violence: Not on file    Family History:    Family History  Problem Relation Age of Onset   Heart attack Mother    Heart disease Mother    Breast cancer Maternal Aunt      ROS:  Please see the history of present illness.   All other ROS reviewed and negative.     Physical Exam/Data:   Vitals:   02/16/21 1000 02/16/21 1045 02/16/21 1115 02/16/21 1147  BP: 128/73   103/63  Pulse: 65 66  66  Resp: 17 11 13 17   Temp:      TempSrc:      SpO2: 98% 100%  98%  Weight:      Height:       No intake or output data in the 24 hours ending 02/16/21 1152 Last 3 Weights 02/15/2021 02/06/2021 01/11/2021  Weight (lbs) 157 lb 157 lb 3.2 oz 153 lb 4.8 oz  Weight (kg) 71.215 kg 71.305 kg 69.536 kg     Body mass index is 27.81 kg/m.  General:  Well nourished, well developed, in no acute distress HEENT: normal Neck: no JVD Vascular: No carotid bruits; Distal pulses 2+  bilaterally Cardiac:  normal S1, S2; RRR; no murmur  Lungs:  clear to auscultation bilaterally, no wheezing, rhonchi or rales  Abd: soft, nontender, no hepatomegaly  Ext: no edema Musculoskeletal:  No deformities, BUE and BLE strength normal and equal Skin: warm and dry  Neuro:  CNs 2-12 intact, no focal abnormalities noted Psych:  Normal affect   EKG:  The EKG was personally reviewed and demonstrates: Rhythm with T wave inversion in global leads Telemetry:  Telemetry was personally reviewed and demonstrates: Normal sinus rhythm, frequent PVC  Relevant CV Studies:  Echo 01/2016 Left ventricle:  The cavity size was normal. Systolic function was    normal. The estimated ejection fraction was in the range of 60%    to 65%. Wall motion was normal; there were no regional wall    motion abnormalities. Left ventricular diastolic function    parameters were normal.  - Aortic valve: Trileaflet; normal thickness leaflets. There was no    regurgitation.  - Aortic root: The aortic root was normal in size.  - Mitral valve: Structurally normal valve. There was mild    regurgitation.  - Left atrium: The atrium was normal in size.  - Right ventricle: The cavity size was normal. Wall thickness was    normal. Pacer wire or catheter noted in right ventricle. Systolic    function was mildly reduced.  - Right atrium: The atrium was normal in size. Pacer wire or    catheter noted in right atrium.  - Tricuspid valve: There was mild regurgitation.  - Inferior vena cava: The vessel was normal in size.  - Pericardium, extracardiac: There was no pericardial effusion.  Cath 10/2006 IMPRESSION:  1. Patent native left sided coronary circulation except for the distal      OM-2 which is occluded.  There are weak collaterals which fill the      distal vessel.  2. Patent right coronary artery with moderate disease.  3. Patent left internal mammary artery to left anterior descending,      patent saphenous vein  graft to D1, patent saphenous vein graft to      right coronary artery, occluded saphenous vein graft to obtuse      marginal 2.  4. Normal ventricular function.  5. No renal artery stenosis.  6. Small infrarenal abdominal aortic aneurysm.    RECOMMENDATIONS:  Continue aggressive medical therapy including beta  blocker, nitrate, ACE inhibitor, statin. Ranexa was also recently added.  If the patient continues to have persistent pain, we would consider  attempted revascularization of that distal OM-2 which is occluded.  However, this is a small territory and appears to be a small distal  vessel. Hopefully medical management will be adequate.    Laboratory Data:  High Sensitivity Troponin:   Recent Labs  Lab 02/15/21 2257 02/16/21 0310 02/16/21 0936  TROPONINIHS 3 3 3      Chemistry Recent Labs  Lab 02/15/21 2257 02/16/21 0936  NA 139 140  K 4.0 3.7  CL 104 104  CO2 28 29  GLUCOSE 114* 109*  BUN 13 9  CREATININE 1.21* 1.09*  CALCIUM 9.6 9.5  MG  --  1.9  GFRNONAA 47* 53*  ANIONGAP 7 7    Recent Labs  Lab 02/16/21 0310 02/16/21 0936  PROT 6.3* 6.5  ALBUMIN 3.7 3.8  AST 16 12*  ALT 13 12  ALKPHOS 68 69  BILITOT 0.9 0.8   Lipids  Recent Labs  Lab 02/16/21 0935  CHOL 181  TRIG 73  HDL 82  LDLCALC 84  CHOLHDL 2.2    Hematology Recent Labs  Lab 02/15/21 2257 02/16/21 0936  WBC 4.0 3.4*  RBC 4.15 4.43  HGB 12.9 13.6  HCT 40.2 42.8  MCV 96.9 96.6  MCH 31.1 30.7  MCHC 32.1 31.8  RDW 12.3 12.3  PLT 195 194   Radiology/Studies:  DG Chest Portable 1 View  Result Date: 02/16/2021 CLINICAL DATA:  Chest pain, history of lung carcinoma EXAM: PORTABLE CHEST 1 VIEW COMPARISON:  01/26/2021 FINDINGS: Cardiac shadow is stable. Defibrillator in postoperative changes are again seen and  stable. The lungs are well aerated bilaterally. No focal infiltrate or sizable effusion is seen. No bony abnormality is seen. IMPRESSION: No acute abnormality noted. Known nodular  changes on prior CT are not well appreciated on this exam. Electronically Signed   By: Inez Catalina M.D.   On: 02/16/2021 02:55     Assessment and Plan:   Chest pain Patient has chronic history of chest pain however worsened for past 3 days, requiring intermittent nitroglycerin with minimal improvement.  Troponin has been negative.  EKG with global T wave inversion, no significant changes from prior. Plan per MD.  2.  CAD s/p CABG and PCI -History as summarized above -Continue home Plavix, amlodipine, statin, Imdur, Ranexa and beta-blocker  3.  Hypertension -Blood pressure stable on current medications  4.  Hyperlipidemia -02/16/2021: Cholesterol 181; HDL 82; LDL Cholesterol 84; Triglycerides 73; VLDL 15  -Continue statin  5.  Adenocarcinoma of lung/COPD -Per primary team   Plan.  Patient is okay for discharge.  Will give prescription of Imdur 60 mg twice daily as well as new nitroglycerin.  Plan for outpatient stress test and then follow-up.  Shared Decision Making/Informed Consent{  The risks [chest pain, shortness of breath, cardiac arrhythmias, dizziness, blood pressure fluctuations, myocardial infarction, stroke/transient ischemic attack, nausea, vomiting, allergic reaction, radiation exposure, metallic taste sensation and life-threatening complications (estimated to be 1 in 10,000)], benefits (risk stratification, diagnosing coronary artery disease, treatment guidance) and alternatives of a nuclear stress test were discussed in detail with Ms. Bugh and she agrees to proceed.   Risk Assessment/Risk Scores:   HEAR Score (for undifferentiated chest pain):  HEAR Score: 5{   For questions or updates, please contact [place] Please consult [email or web address] for contact info under    Signed, Leanor Kail, PA  02/16/2021 11:52 AM    I have examined the patient and reviewed assessment and plan and discussed with patient.  Agree with above as stated. Ruled out for  MI.  ECG unchanged.  Patient is pain free.  SHe has had a long h/o CAD with chronic chest pain manageed medically.  Nitrates have worked well for her.  OK to discharge from a cardiac standpoint.  Will plan for outpatient nuclear stress test.    Continue Imdur 60 mg BID.   Larae Grooms

## 2021-02-16 NOTE — ED Notes (Signed)
Pt ambulatory to and from restroom with 1 stand by assist

## 2021-02-16 NOTE — ED Notes (Signed)
Ordered tray for pt

## 2021-02-16 NOTE — ED Notes (Signed)
Pt verbalized understanding of discharge instructions; opportunity for questions provided °

## 2021-02-16 NOTE — Assessment & Plan Note (Signed)
Continuing home regimen of daily PPI therapy.  

## 2021-02-16 NOTE — Assessment & Plan Note (Addendum)
.   Resume patients home regimen of oral antihypertensives . With changes noted by cardiology

## 2021-02-16 NOTE — Assessment & Plan Note (Signed)
   No clinical evidence of COPD exacerbation this time  As needed bronchodilator therapy for shortness of breath and wheezing.

## 2021-02-16 NOTE — Assessment & Plan Note (Signed)
.   Continuing home regimen of lipid lowering therapy.  

## 2021-02-16 NOTE — Assessment & Plan Note (Addendum)
   CE negative  Outpatient stress test

## 2021-02-20 ENCOUNTER — Telehealth (HOSPITAL_COMMUNITY): Payer: Self-pay

## 2021-02-20 NOTE — Telephone Encounter (Signed)
Spoke with the patient, detailed instructions given. She stated that she would be here for her test. Asked to call back with any questions. S.Dorothee Napierkowski EMTP 

## 2021-02-21 ENCOUNTER — Encounter: Payer: Medicare Other | Admitting: Student

## 2021-02-21 ENCOUNTER — Encounter: Payer: Medicare Other | Admitting: Internal Medicine

## 2021-02-22 ENCOUNTER — Other Ambulatory Visit: Payer: Self-pay

## 2021-02-22 ENCOUNTER — Ambulatory Visit (HOSPITAL_COMMUNITY): Payer: Medicare Other | Attending: Interventional Cardiology

## 2021-02-22 DIAGNOSIS — I200 Unstable angina: Secondary | ICD-10-CM

## 2021-02-22 LAB — MYOCARDIAL PERFUSION IMAGING
LV dias vol: 65 mL (ref 46–106)
LV sys vol: 26 mL
Nuc Stress EF: 60 %
Peak HR: 85 {beats}/min
Rest HR: 59 {beats}/min
Rest Nuclear Isotope Dose: 11 mCi
SDS: 2
SRS: 1
SSS: 3
ST Depression (mm): 0 mm
Stress Nuclear Isotope Dose: 32.6 mCi
TID: 1.03

## 2021-02-22 MED ORDER — TECHNETIUM TC 99M TETROFOSMIN IV KIT
11.0000 | PACK | Freq: Once | INTRAVENOUS | Status: AC | PRN
  Administered 2021-02-22: 11 via INTRAVENOUS
  Filled 2021-02-22: qty 11

## 2021-02-22 MED ORDER — REGADENOSON 0.4 MG/5ML IV SOLN
0.4000 mg | Freq: Once | INTRAVENOUS | Status: AC
  Administered 2021-02-22: 0.4 mg via INTRAVENOUS

## 2021-02-22 MED ORDER — TECHNETIUM TC 99M TETROFOSMIN IV KIT
32.6000 | PACK | Freq: Once | INTRAVENOUS | Status: AC | PRN
  Administered 2021-02-22: 32.6 via INTRAVENOUS
  Filled 2021-02-22: qty 33

## 2021-02-26 ENCOUNTER — Ambulatory Visit (INDEPENDENT_AMBULATORY_CARE_PROVIDER_SITE_OTHER): Payer: Medicare Other

## 2021-02-26 DIAGNOSIS — I4901 Ventricular fibrillation: Secondary | ICD-10-CM | POA: Diagnosis not present

## 2021-02-26 LAB — CUP PACEART REMOTE DEVICE CHECK
Battery Remaining Longevity: 72 mo
Battery Remaining Percentage: 74 %
Brady Statistic RV Percent Paced: 0 %
Date Time Interrogation Session: 20221031044100
HighPow Impedance: 72 Ohm
Implantable Lead Implant Date: 20071205
Implantable Lead Location: 753860
Implantable Lead Model: 137
Implantable Lead Serial Number: 102363
Implantable Pulse Generator Implant Date: 20141105
Lead Channel Impedance Value: 442 Ohm
Lead Channel Pacing Threshold Amplitude: 0.8 V
Lead Channel Pacing Threshold Pulse Width: 0.5 ms
Lead Channel Setting Pacing Amplitude: 2.4 V
Lead Channel Setting Pacing Pulse Width: 0.5 ms
Lead Channel Setting Sensing Sensitivity: 0.6 mV
Pulse Gen Serial Number: 126146

## 2021-03-05 NOTE — Progress Notes (Signed)
Electrophysiology Office Note Date: 03/06/2021  ID:  [name], DOB 24-Mar-1946, MRN [id number]  PCP: [facility] [place] Primary Cardiologist: Larae Grooms, MD Electrophysiologist: Virl Axe, MD   CC: Routine ICD follow-up  Karen [name] is a 75 y.o. female seen today for Virl Axe, MD for post hospital follow up.    Seen by [facility] 02/16/21 in hospital for chest pain. Long history of chronic stable angina managed medically. Ruled out for MI. HS trop negative, EKG stable from prior.   Since discharge from hospital the patient reports doing very well. Her chest pain has essentially resolved on famotidine and increased imdur.  she denies chest pain, palpitations, dyspnea, PND, orthopnea, nausea, vomiting, dizziness, syncope, edema, weight gain, or early satiety. He has not had ICD shocks.   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    Past Surgical History:  Procedure Laterality Date   CARDIAC DEFIBRILLATOR PLACEMENT  03/2006   [employer]. remote-yes.    CORONARY ARTERY BYPASS GRAFT     x4   IMPLANTABLE CARDIOVERTER DEFIBRILLATOR GENERATOR CHANGE N/A 03/03/2013   Procedure: IMPLANTABLE CARDIOVERTER DEFIBRILLATOR GENERATOR CHANGE;  Surgeon: Deboraha Sprang, MD;  [facility] C[facility] LAB;  Service: Cardiovascular;  Laterality: N/A;   stent implant     x1. possibly x2.     Current Outpatient Medications  Medication Sig Dispense Refill   acetaminophen  (TYLENOL) 325 MG tablet Take 2 tablets (650 mg total) by mouth every 6 (six) hours as needed. 60 tablet 0   amLODipine (NORVASC) 10 MG tablet TAKE 1 TABLET(10 MG) BY MOUTH DAILY 90 tablet 2   atorvastatin (LIPITOR) 10 MG tablet TAKE 1 TABLET(10 MG) BY MOUTH DAILY 90 tablet 3   clopidogrel (PLAVIX) 75 MG tablet TAKE 1 TABLET(75 MG) BY MOUTH DAILY 90 tablet 3   cyclobenzaprine (FLEXERIL) 10 MG tablet Take 10 mg by mouth 3 (three) times daily as needed for muscle spasms.     furosemide (LASIX) 20 MG tablet TAKE 1 TABLET(20 MG) BY MOUTH DAILY 90 tablet 3   isosorbide mononitrate (IMDUR) 60 MG 24 hr tablet Take 1 tablet (60 mg total) by mouth 2 (two) times daily. 60 tablet 6   lisinopril (ZESTRIL) 10 MG tablet TAKE 1 TABLET(10 MG) BY MOUTH DAILY 90 tablet 2   metoprolol tartrate (LOPRESSOR) 25 MG tablet TAKE 1 TABLET BY MOUTH TWICE DAILY 180 tablet 3   nitroGLYCERIN (NITROSTAT) 0.4 MG SL tablet Place 1 tablet (0.4 mg total) under the tongue every 5 (five) minutes as needed for chest pain (X3 DOSES MAX). 25 tablet 11   oxyCODONE-acetaminophen (PERCOCET) 7.5-325 MG tablet Take 1-2 tablets by mouth every 4 (four) hours as needed for moderate pain (back pain).     pregabalin (LYRICA) 50 MG capsule Take 50 mg by mouth at bedtime as needed (leg pain).     ranolazine (RANEXA) 500 MG 12 hr tablet TAKE 1 TABLET BY MOUTH TWICE DAILY 180 tablet 1  Tiotropium Bromide Monohydrate (SPIRIVA RESPIMAT) 1.25 MCG/ACT AERS Inhale 2 puffs into the lungs daily. 4 g 0   No current facility-administered medications for this visit.    Allergies:   Codeine and Percocet [oxycodone-acetaminophen]   Social History: Social History   Socioeconomic History   Marital status: Legally Separated    Spouse name: Not on file   Number of children: Not on file   Years of education: Not on file   Highest education level: Not on file  Occupational History   Not on file  Tobacco Use   Smoking status: Some Days    Packs/day: 0.25     Years: 54.00    Pack years: 13.50    Types: Cigarettes    Start date: 1968   Smokeless tobacco: Never   Tobacco comments:    States she only smokes some when she gets nervous as of 01/02/21  Vaping Use   Vaping Use: Never used  Substance and Sexual Activity   Alcohol use: No   Drug use: No   Sexual activity: Not on file  Other Topics Concern   Not on file  Social History Narrative   Married, 4 children.    Social Determinants of Health   Financial Resource Strain: Not on file  Food Insecurity: Not on file  Transportation Needs: Not on file  Physical Activity: Not on file  Stress: Not on file  Social Connections: Not on file  Intimate Partner Violence: Not on file    Family History: Family History  Problem Relation Age of Onset   Heart attack Mother    Heart disease Mother    Breast cancer Maternal Aunt     Review of Systems: All other systems reviewed and are otherwise negative except as noted above.   Physical Exam: Vitals:   03/06/21 0834  BP: 114/60  Pulse: 72  SpO2: 99%  Weight: 155 lb 6.4 oz (70.5 kg)  Height: 5\' 3"  (1.6 m)     GEN- The patient is well appearing, alert and oriented x 3 today.   HEENT: normocephalic, atraumatic; sclera clear, conjunctiva pink; hearing intact; oropharynx clear; neck supple, no JVP Lymph- no cervical lymphadenopathy Lungs- Clear to ausculation bilaterally, normal work of breathing.  No wheezes, rales, rhonchi Heart- Regular rate and rhythm, no murmurs, rubs or gallops, PMI not laterally displaced GI- soft, non-tender, non-distended, bowel sounds present, no hepatosplenomegaly Extremities- no clubbing or cyanosis. No edema; DP/PT/radial pulses 2+ bilaterally MS- no significant deformity or atrophy Skin- warm and dry, no rash or lesion; ICD pocket well healed Psych- euthymic mood, full affect Neuro- strength and sensation are intact  ICD interrogation- reviewed in detail today,  See PACEART report  EKG:  EKG is not  ordered today. Personal review of EKG ordered  02/16/21  shows NSR at 75 bpm  Recent Labs: 02/16/2021: ALT 12; BUN 9; Creatinine, Ser 1.09; Hemoglobin 13.6; Magnesium 1.9; Platelets 194; Potassium 3.7; Sodium 140   Wt Readings from Last 3 Encounters:  03/06/21 155 lb 6.4 oz (70.5 kg)  02/22/21 157 lb (71.2 kg)  02/15/21 157 lb (71.2 kg)     Other studies Reviewed: Additional studies/ records that were reviewed today include: Previous EP office notes.   Assessment and Plan:  1.  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  CAD Denies s/s ischemia Continue medical therapy.  Current medicines  are reviewed at length with the patient today.    Labs/ tests ordered today include:  Orders Placed This Encounter  Procedures   Basic metabolic panel   CBC   Disposition:   Follow up with Dr. Caryl Comes in 12 months; sooner prn   Signed, Shirley Friar, PA-C  03/06/2021 8:54 AM  [facility] [address] [place] [address] [phone] (office) [phone] (fax)

## 2021-03-06 ENCOUNTER — Ambulatory Visit (INDEPENDENT_AMBULATORY_CARE_PROVIDER_SITE_OTHER): Payer: Medicare Other | Admitting: Student

## 2021-03-06 ENCOUNTER — Other Ambulatory Visit: Payer: Self-pay

## 2021-03-06 ENCOUNTER — Encounter: Payer: Self-pay | Admitting: Student

## 2021-03-06 VITALS — BP 114/60 | HR 72 | Ht 63.0 in | Wt 155.4 lb

## 2021-03-06 DIAGNOSIS — I25708 Atherosclerosis of coronary artery bypass graft(s), unspecified, with other forms of angina pectoris: Secondary | ICD-10-CM | POA: Diagnosis not present

## 2021-03-06 DIAGNOSIS — I4901 Ventricular fibrillation: Secondary | ICD-10-CM

## 2021-03-06 LAB — BASIC METABOLIC PANEL
BUN/Creatinine Ratio: 11 — ABNORMAL LOW (ref 12–28)
BUN: 15 mg/dL (ref 8–27)
CO2: 27 mmol/L (ref 20–29)
Calcium: 9.5 mg/dL (ref 8.7–10.3)
Chloride: 102 mmol/L (ref 96–106)
Creatinine, Ser: 1.33 mg/dL — ABNORMAL HIGH (ref 0.57–1.00)
Glucose: 119 mg/dL — ABNORMAL HIGH (ref 70–99)
Potassium: 4.2 mmol/L (ref 3.5–5.2)
Sodium: 141 mmol/L (ref 134–144)
eGFR: 42 mL/min/{1.73_m2} — ABNORMAL LOW (ref >59)

## 2021-03-06 LAB — CBC
Hematocrit: 36.5 % (ref 34.0–46.6)
Hemoglobin: 12.1 g/dL (ref 11.1–15.9)
MCH: 30.6 pg (ref 26.6–33.0)
MCHC: 33.2 g/dL (ref 31.5–35.7)
MCV: 92 fL (ref 79–97)
Platelets: 191 10*3/uL (ref 150–450)
RBC: 3.95 x10E6/uL (ref 3.77–5.28)
RDW: 12.2 % (ref 11.7–15.4)
WBC: 4 10*3/uL (ref 3.4–10.8)

## 2021-03-06 LAB — CUP PACEART INCLINIC DEVICE CHECK
Date Time Interrogation Session: 20221108085508
Implantable Lead Implant Date: 20071205
Implantable Lead Location: 753860
Implantable Lead Model: 137
Implantable Lead Serial Number: 102363
Implantable Pulse Generator Implant Date: 20141105
Pulse Gen Serial Number: 126146

## 2021-03-06 NOTE — Patient Instructions (Signed)
Medication Instructions:  Your physician recommends that you continue on your current medications as directed. Please refer to the Current Medication list given to you today.  *If you need a refill on your cardiac medications before your next appointment, please call your pharmacy*   Lab Work: TODAY: BMET, CBC  If you have labs (blood work) drawn today and your tests are completely normal, you will receive your results only by: [place] (if you have MyChart) OR A paper copy in the mail If you have any lab test that is abnormal or we need to change your treatment, we will call you to review the results.   Follow-Up: At [facility], you and your health needs are our priority.  As part of our continuing mission to provide you with exceptional heart care, we have created designated Provider Care Teams.  These Care Teams include your primary Cardiologist (physician) and Advanced Practice Providers (APPs -  Physician Assistants and Nurse Practitioners) who all work together to provide you with the care you need, when you need it.   Your next appointment:   1 year(s)  The format for your next appointment:   In Person  Provider:   You may see Virl Axe, MD or one of the following Advanced Practice Providers on your designated Care Team:   Tommye Standard, [place] [facility] [place], [place]

## 2021-03-06 NOTE — Progress Notes (Signed)
Remote ICD transmission.   

## 2021-03-14 ENCOUNTER — Encounter: Payer: Self-pay | Admitting: Radiology

## 2021-03-17 NOTE — Progress Notes (Incomplete)
  Radiation Oncology         [phone] ________________________________  Patient Name: Karen [name] MRN: [id number] DOB: 04-02-1946 Referring Physician: Baltazar Apo (Profile Not Attached) Date of Service: 02/15/2021 [facility], [place]   End Of Treatment Note  Diagnoses: C34.32-Malignant neoplasm of lower lobe, left bronchus or lung  Cancer Staging: The encounter diagnosis was Pulmonary nodules/lesions, multiple.   Biopsy proven adenocarcinoma of LLL, with additional ground-glass nodule in the posterior right lower lobe suspicious for bronchogenic neoplasm such as noninvasive adenocarcinoma  Intent: Curative  Radiation Treatment Dates: 02/08/2021 through 02/15/2021 Site Technique Total Dose (Gy) Dose per Fx (Gy) Completed Fx Beam Energies  Lung, Left: Lung_Lt IMRT 54/54 18 3/3 6XFFF   Narrative: The patient tolerated radiation therapy relatively well. Patient reports shortness of breath with activity and cough mostly at night. Cough is productive with white or watery phlegm. Denies hemoptysis. Patient reports choking often and feeling that food gets stuck which occasionally causes small amounts of vomiting. Reports all foods and occasionally water will be difficult to swallow. Reports chest tightness and pain precipitated by activity.  Plan: She overall tolerated treatment well.  Symptoms of above seem to be chronic in nature.  Patient will follow-up in 1 month routine follow-up.  She will then be scheduled for a chest CT scan 4 months out from treatment to follow-up on her SBRT treatments ________________________________________________ -----------------------------------  Blair Promise, PhD, MD  This document serves as a record of services personally performed by Gery Pray, MD. It was created on his behalf by Roney Mans, a trained medical scribe. The creation of this record is based on the scribe's personal observations and the provider's  statements to them. This document has been checked and approved by the attending provider.

## 2021-03-18 NOTE — Progress Notes (Signed)
Radiation Oncology         [phone] ________________________________  Name: Karen [name] MRN: [id number]  Date: 03/19/2021  DOB: Jun 16, 1945  Follow-Up Visit Note  CC: [facility]  Byrum, Rose Fillers, MD    ICD-10-CM   1. Adenocarcinoma of lung, unspecified laterality ([place])  C34.90       Diagnosis: The encounter diagnosis was Pulmonary nodules/lesions, multiple.   Biopsy proven adenocarcinoma of LLL, with additional ground-glass nodule in the posterior right lower lobe suspicious for bronchogenic neoplasm such as noninvasive adenocarcinoma  Interval Since Last Radiation: 1 month and 1 day   Intent: Curative  Radiation Treatment Dates: 02/08/2021 through 02/15/2021 Site Technique Total Dose (Gy) Dose per Fx (Gy) Completed Fx Beam Energies  Lung, Left: Lung_Lt IMRT 54/54 18 3/3 6XFFF    Narrative:  The patient returns today for routine follow-up.  The patient tolerated radiation therapy relatively well other than reports of shortness of breath / chest tightness with activity, and cough mostly at night. Patient reported her cough as productive of white or watery phlegm. The patient also reported choking often and feeling that "food gets stuck" which occasionally caused her to vomit (reported that all foods and occasionally water as difficult to swallow). Overall, her above symptoms were seemingly chronic in nature.   Since her initial consultation date of 01/11/21, the patient followed up with Dr. Lamonte Sakai on 02/06/21. During which time, the patient was noted to likely have COPD based on her tobacco history and underlying respiratory symptoms. The patient reported that she had been on  bronchodilators in the past which benefited her well. Dr. Lamonte Sakai accordingly started the patient on Spiriva. The patient will follow-up with Dr. Lamonte Sakai to re-check spiriva effectiveness.   On the day of her final treatment, the patient presented to the ED on 02/15/21 with a 2 day history of  intermittent chest tightness. The patient defined the chest tightness to worsen with exertion, and as located to the center of her chest. The patient also reported taking her nitro more than 3 times without resolution of symptoms, which prompted her presenting to the ED. EKG and CXR performed were both negative. The patient was given famotidine and her imdur Rx was increased.   The patient followed up with her cardiologist on 03/06/21 and was noted to report resolution of her chest pain on famotidine and increased imdur. She denied any recent  palpitations, dyspnea, PND, orthopnea, nausea, or vomiting.   Pertinent imaging since the patient was last seen includes:  --CT of the head on 01/26/21 which demonstrated no evidence of intracranial metastatic disease. --Chest CT on 01/26/21 demonstrated no substantial interval change in the 14 x 9 mm irregular nodule in the superior segment left lower lobe. Interval development of a finger-like projection was also appreciated of soft tissue, anterior to the lesion, noted to suggest a small airway impaction. Additionally, a stable 16 mm peripheral ground-glass nodule of the posterior right lower lobe was seen, with tethering to the adjacent pleura (noted to possibly resemble adenocarcinoma). A stable 6 mm ground-glass nodule was also seen in the left upper lobe.  She reports improvement in her chest tightness with medication change per cardiology.  She denies any significant cough or hemoptysis since completion of her treatment.  Allergies:  is allergic to codeine and percocet [oxycodone-acetaminophen].  Meds: Current Outpatient Medications  Medication Sig Dispense Refill   acetaminophen (TYLENOL) 325 MG tablet Take 2 tablets (650 mg total) by mouth every 6 (six) hours  as needed. 60 tablet 0   amLODipine (NORVASC) 10 MG tablet TAKE 1 TABLET(10 MG) BY MOUTH DAILY 90 tablet 2   atorvastatin (LIPITOR) 10 MG tablet TAKE 1 TABLET(10 MG) BY MOUTH DAILY 90 tablet 3    clopidogrel (PLAVIX) 75 MG tablet TAKE 1 TABLET(75 MG) BY MOUTH DAILY 90 tablet 3   cyclobenzaprine (FLEXERIL) 10 MG tablet Take 10 mg by mouth 3 (three) times daily as needed for muscle spasms.     famotidine (PEPCID) 20 MG tablet Take 20 mg by mouth at bedtime.     furosemide (LASIX) 20 MG tablet TAKE 1 TABLET(20 MG) BY MOUTH DAILY 90 tablet 3   isosorbide mononitrate (IMDUR) 60 MG 24 hr tablet Take 1 tablet (60 mg total) by mouth 2 (two) times daily. 60 tablet 6   lisinopril (ZESTRIL) 10 MG tablet TAKE 1 TABLET(10 MG) BY MOUTH DAILY 90 tablet 2   metoprolol tartrate (LOPRESSOR) 25 MG tablet TAKE 1 TABLET BY MOUTH TWICE DAILY 180 tablet 3   nitroGLYCERIN (NITROSTAT) 0.4 MG SL tablet Place 1 tablet (0.4 mg total) under the tongue every 5 (five) minutes as needed for chest pain (X3 DOSES MAX). 25 tablet 11   oxyCODONE-acetaminophen (PERCOCET) 7.5-325 MG tablet Take 1-2 tablets by mouth every 4 (four) hours as needed for moderate pain (back pain).     pregabalin (LYRICA) 50 MG capsule Take 50 mg by mouth at bedtime as needed (leg pain).     ranolazine (RANEXA) 500 MG 12 hr tablet TAKE 1 TABLET BY MOUTH TWICE DAILY 180 tablet 1   Tiotropium Bromide Monohydrate (SPIRIVA RESPIMAT) 1.25 MCG/ACT AERS Inhale 2 puffs into the lungs daily. 4 g 0   No current facility-administered medications for this encounter.    Physical Findings: The patient is in no acute distress. Patient is alert and oriented.  height is 5\' 3"  (1.6 m) and weight is 157 lb 6.4 oz (71.4 kg). Her temperature is 97.9 F (36.6 C). Her blood pressure is 103/58 (abnormal) and her pulse is 70. Her respiration is 20 and oxygen saturation is 100%. .  Lungs are clear to auscultation bilaterally. Heart has regular rate and rhythm. No palpable cervical, supraclavicular, or axillary adenopathy. Abdomen soft, non-tender, normal bowel sounds.    Lab Findings: Lab Results  Component Value Date   WBC 4.0 03/06/2021   HGB 12.1 03/06/2021    HCT 36.5 03/06/2021   MCV 92 03/06/2021   PLT 191 03/06/2021    Radiographic Findings: MYOCARDIAL PERFUSION IMAGING  Result Date: 02/22/2021   Findings are consistent with no ischemia. The study is low risk.   No ST deviation was noted.   LV perfusion is abnormal. Defect 1: There is a small defect with moderate reduction in uptake present in the mid inferior location(s) that is fixed. There is normal wall motion in the defect area. Consistent with artifact caused by bowel tracer uptake.   Left ventricular function is normal. Nuclear stress EF: 60 %. The left ventricular ejection fraction is normal (55-65%). End diastolic cavity size is normal. End systolic cavity size is normal.   Prior study available for comparison from 10/08/2013. No changes compared to prior study. Low risk study, no evidence of ischemia. Fixed defect in mid inferior wall adjacent to high extracardiac counts, consistent with artifact.   CUP PACEART INCLINIC DEVICE CHECK  Result Date: 03/06/2021 ICD check in clinic. Normal device function. Thresholds and sensing consistent with previous device measurements. Impedance trends stable over time. No mode switches.  No ventricular arrhythmias. Histogram distribution appropriate for patient and level of  activity. No changes made this session. Device programmed at appropriate safety margins. Estimated longevity >6 years. Pt enrolled in remote follow-up. Patient education completed. Issue with flash drive. See scanned report.  CUP PACEART REMOTE DEVICE CHECK  Result Date: 02/26/2021 Scheduled remote reviewed. Normal device function.  1 NSVT, 7 beats Next remote 91 days. LR   Impression:  The encounter diagnosis was Pulmonary nodules/lesions, multiple.   Biopsy proven adenocarcinoma of LLL, with additional ground-glass nodule in the posterior right lower lobe suspicious for bronchogenic neoplasm such as noninvasive adenocarcinoma  The patient tolerated her SBRT well without any  significant side effects.  No evidence of recurrence on clinical exam today.  Plan: The patient will follow up with Dr. Julien Nordmann in mid January.  Scans to be performed prior to that follow-up appointment.  In light of the patient's close follow-up in medical oncology have not scheduled her for formal follow-up appointment but would be glad to see her at any time.   __________________________  Blair Promise, PhD, MD   This document serves as a record of services personally performed by Gery Pray, MD. It was created on his behalf by Roney Mans, a trained medical scribe. The creation of this record is based on the scribe's personal observations and the provider's statements to them. This document has been checked and approved by the attending provider.

## 2021-03-19 ENCOUNTER — Ambulatory Visit
Admission: RE | Admit: 2021-03-19 | Discharge: 2021-03-19 | Disposition: A | Discharge status: Home / Self Care | Payer: Medicare Other | Source: Ambulatory Visit | Attending: Radiation Oncology | Admitting: Radiation Oncology

## 2021-03-19 ENCOUNTER — Other Ambulatory Visit: Payer: Self-pay

## 2021-03-19 ENCOUNTER — Encounter: Payer: Self-pay | Admitting: Radiation Oncology

## 2021-03-19 DIAGNOSIS — Z923 Personal history of irradiation: Secondary | ICD-10-CM | POA: Diagnosis not present

## 2021-03-19 DIAGNOSIS — C3490 Malignant neoplasm of unspecified part of unspecified bronchus or lung: Secondary | ICD-10-CM

## 2021-03-19 DIAGNOSIS — Z79899 Other long term (current) drug therapy: Secondary | ICD-10-CM | POA: Diagnosis not present

## 2021-03-19 DIAGNOSIS — C3432 Malignant neoplasm of lower lobe, left bronchus or lung: Secondary | ICD-10-CM | POA: Diagnosis not present

## 2021-03-19 NOTE — Progress Notes (Signed)
Karen [name] is here today for follow up post radiation to the lung.  Lung Side: Left, patient completed treatment on 02/15/21.  Does the patient complain of any of the following: Pain:Patient reports having pain to bilateral lower extremities.  Patient reports having stabbing pain under left rib cage that radiates to her back.  Shortness of breath w/wo exertion: Yes, patient reports having shortness of breath on exertion. Cough: Patient reports having a productive cough that is worse at night. Patient reports having thick, white sputum.  Hemoptysis: no Pain with swallowing: Yes Swallowing/choking concerns: yes, reports choking frequently.  Appetite: Good Energy Level: Reports having a low energy level.  Post radiation skin Changes: no Wt Readings from Last 3 Encounters:  03/19/21 157 lb 6.4 oz (71.4 kg)  03/06/21 155 lb 6.4 oz (70.5 kg)  02/22/21 157 lb (71.2 kg)       Additional comments if applicable:  Vitals:   67/28/97 1138  BP: (!) 103/58  Pulse: 70  Resp: 20  Temp: 97.9 F (36.6 C)  SpO2: 100%  Weight: 157 lb 6.4 oz (71.4 kg)  Height: 5\' 3"  (1.6 m)

## 2021-04-02 ENCOUNTER — Other Ambulatory Visit: Payer: Self-pay | Admitting: Physician Assistant

## 2021-04-06 ENCOUNTER — Other Ambulatory Visit: Payer: Self-pay

## 2021-04-06 ENCOUNTER — Ambulatory Visit (INDEPENDENT_AMBULATORY_CARE_PROVIDER_SITE_OTHER): Payer: Medicare Other | Admitting: Nurse Practitioner

## 2021-04-06 ENCOUNTER — Ambulatory Visit (INDEPENDENT_AMBULATORY_CARE_PROVIDER_SITE_OTHER): Payer: Medicare Other | Admitting: Emergency Medicine

## 2021-04-06 ENCOUNTER — Encounter: Payer: Self-pay | Admitting: Nurse Practitioner

## 2021-04-06 VITALS — BP 128/66 | HR 62 | Temp 98.2°F | Ht 63.0 in | Wt 157.0 lb

## 2021-04-06 DIAGNOSIS — I10 Essential (primary) hypertension: Secondary | ICD-10-CM

## 2021-04-06 DIAGNOSIS — Z720 Tobacco use: Secondary | ICD-10-CM

## 2021-04-06 DIAGNOSIS — F17210 Nicotine dependence, cigarettes, uncomplicated: Secondary | ICD-10-CM | POA: Diagnosis not present

## 2021-04-06 DIAGNOSIS — J301 Allergic rhinitis due to pollen: Secondary | ICD-10-CM

## 2021-04-06 DIAGNOSIS — R918 Other nonspecific abnormal finding of lung field: Secondary | ICD-10-CM

## 2021-04-06 DIAGNOSIS — J449 Chronic obstructive pulmonary disease, unspecified: Secondary | ICD-10-CM | POA: Diagnosis not present

## 2021-04-06 DIAGNOSIS — C3492 Malignant neoplasm of unspecified part of left bronchus or lung: Secondary | ICD-10-CM

## 2021-04-06 LAB — PULMONARY FUNCTION TEST
DL/VA % pred: 110 %
DL/VA: 4.57 ml/min/mmHg/L
DLCO cor % pred: 73 %
DLCO cor: 13.69 ml/min/mmHg
DLCO unc % pred: 70 %
DLCO unc: 13.11 ml/min/mmHg
FEF 25-75 Post: 0.66 L/sec
FEF 25-75 Pre: 0.87 L/sec
FEF2575-%Change-Post: -24 %
FEF2575-%Pred-Post: 44 %
FEF2575-%Pred-Pre: 59 %
FEV1-%Change-Post: -8 %
FEV1-%Pred-Post: 71 %
FEV1-%Pred-Pre: 77 %
FEV1-Post: 1.17 L
FEV1-Pre: 1.28 L
FEV1FVC-%Change-Post: -4 %
FEV1FVC-%Pred-Pre: 94 %
FEV6-%Change-Post: -2 %
FEV6-%Pred-Post: 83 %
FEV6-%Pred-Pre: 85 %
FEV6-Post: 1.7 L
FEV6-Pre: 1.75 L
FEV6FVC-%Pred-Post: 104 %
FEV6FVC-%Pred-Pre: 104 %
FVC-%Change-Post: -3 %
FVC-%Pred-Post: 79 %
FVC-%Pred-Pre: 83 %
FVC-Post: 1.7 L
FVC-Pre: 1.77 L
Post FEV1/FVC ratio: 69 %
Post FEV6/FVC ratio: 100 %
Pre FEV1/FVC ratio: 72 %
Pre FEV6/FVC Ratio: 100 %
RV % pred: 82 %
RV: 1.84 L
TLC % pred: 75 %
TLC: 3.68 L

## 2021-04-06 MED ORDER — FLUTICASONE PROPIONATE 50 MCG/ACT NA SUSP: 1.0000 | Freq: Every day | NASAL | 2 refills | Status: DC

## 2021-04-06 MED ORDER — LORATADINE 10 MG PO TABS: 10.0000 mg | ORAL_TABLET | Freq: Every day | ORAL | 11 refills | Status: DC

## 2021-04-06 MED ORDER — GUAIFENESIN ER 600 MG PO TB12: 600.0000 mg | ORAL_TABLET | Freq: Two times a day (BID) | ORAL | 3 refills | Status: DC

## 2021-04-06 MED ORDER — ALBUTEROL SULFATE HFA 108 (90 BASE) MCG/ACT IN AERS: 2.0000 | INHALATION_SPRAY | Freq: Four times a day (QID) | RESPIRATORY_TRACT | 2 refills | Status: DC | PRN

## 2021-04-06 NOTE — Patient Instructions (Addendum)
-  Continue Spiriva 2 puffs daily -Continue albuterol inhaler 2 puffs every 6 hours as needed for shortness of breath or wheezing  -Saline nasal spray 2-3 times a day as needed for nasal congestion/postnasal drip until symptoms improve -Mucinex 600 mg twice daily -Fluticasone (Flonase) nasal spray 1-2 sprays each nostril daily  -Claritin (loratidine) 10 mg daily for allergy symptoms -Chlortab 4mg  over the counter at night as needed for cough   Your pulmonary function testing showed moderate obstruction and a slight diffusion defect, consistent with moderate COPD.  Walk oximetry today SpO2 low 98%. Limited by back pain.  Continue to follow up for your radiation treatments and with your oncologist as scheduled.   TED hose for lower extremity swelling. Notify your cardiologist if swelling persists or if you develop palpitations, chest pain/tightness, or increased weight gain.   Notify if worsening breathlessness, cough, mucus production, fatigue, or wheezing occurs.  Maintain up to date vaccinations, including influenza, COVID, and pneumococcal.  Wash your hands often and avoid sick exposures.  Encouraged masking in crowds.  Smoking cessation strongly encouraged and discussed.  Avoid triggers, when possible.  Exercise, as tolerated. Notify if worsening symptoms upon exertion occur.   Follow up in 3 months with Dr. Lamonte Sakai, Roxan Diesel, NP or APP. If symptoms do not improve or worsen, please contact office for sooner follow up or seek emergency care.

## 2021-04-06 NOTE — Patient Instructions (Signed)
Full PFT performed today. °

## 2021-04-06 NOTE — Assessment & Plan Note (Signed)
Strongly encouraged smoking cessation given lung cancer and COPD diagnoses. Reported only smoking when she gets nervous. High stress situation with radiation treatment. Will reassess usage at next visit and discuss possible cessation treatment.

## 2021-04-06 NOTE — Progress Notes (Signed)
Full PFT performed today. °

## 2021-04-06 NOTE — Assessment & Plan Note (Signed)
See above plan. 

## 2021-04-06 NOTE — Progress Notes (Signed)
@Patient  ID: Karen [name], female    DOB: 04-Feb-1946, 75 y.o.   MRN: [id number]  Chief Complaint  Patient presents with   Follow-up    PFT's done today. Spiriva has helped with SOB. She still has occ wheezing. She has cough at bedtime- prod with clear, foamy sputum, sometimes wakes her up.       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  She is a patient of Dr. Agustina Caroli and was last seen in office on 02/07/2019.  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  TEST/EVENTS:  09/15/2020 lung cancer screening CT: No lymphadenopathy.  Mild diffuse bronchial wall thickening.  Scattered bilateral pulmonary nodules, the most suspicious of which is a solid spiculated superior segment left lower lobe pulmonary nodule measuring 11.2 mm in volume.  Lung-RADS 4A.  Atherosclerosis 12/08/2020 PET scan: Left lower lobe nodule found to be hypermetabolic, right lower lobe nodule mild hypermetabolism 01/03/2021 CT biopsy: Core needle biopsy of hyper metabolic left lower lobe pulmonary nodule.  Results significant for adenocarcinoma 01/26/2021 CT chest with contrast: No lymphadenopathy.  14 x 9 mm irregular nodule in superior left lower lobe.  5mm peripheral groundglass nodule posterior right lower lobe.  6 mm groundglass nodule in the left upper lobe stable.  No new suspicious pulmonary nodules or masses.  No focal airspace consolidation.  No pleural effusion. 04/06/2021 PFTs: FVC 1.77 (83), FEV1 1.28 (77), ratio 69, TLC 3.68 (75), DLCO corrected 13.69 (73). Moderate obstruction with mild decrease in lung volume and mild diffusion defect  02/06/2021: OV with Dr. Lamonte Sakai.  Seen at [facility] for adenocarcinoma.  Plan for SBRT to left lower lobe with consideration  for more work-up or empiric SBRT to the right lower lobe nodule depending on evolution.  Reported bilateral upper leg pain, exertional fatigue, cough at night.  PFT scheduled trial of Spiriva.  Albuterol as needed.  Follow-up with Dr. Dineen Kid regarding pulmonary nodules/adenocarcinoma.  04/06/2021: Today - follow up after PFTs Patient presents today for follow-up after pulmonary function testing.  PFTs consistent with moderate COPD with mild reduction in lung volumes and mild diffusion defect. She reports occasional shortness of breath upon exertion, improved with Spiriva. She continues to experience a nocturnal productive cough with white sputum. She does report allergy symptoms including clear rhinorrhea, itchy/watery eyes, and post nasal drip. She also occasionally experiences swelling in her lower extremities, primarily at the end of the day. She denies chest pain or tightness, PND, palpitations, weight gain, wheezing, or orthopnea. She recently started radiation treatments and has completes 5-6 with plans to restart in January. She tolerated them well but does have some fatigue and occasional left sided discomfort after treatments. She continues on spiriva daily. Overall, she feels ok and offers no further complaints.   CAT 20   Allergies  Allergen Reactions   Codeine Itching and Swelling   Percocet [Oxycodone-Acetaminophen] Hives and Itching    Immunization History  Administered Date(s) Administered   Fluad Quad(high Dose 65+) 01/02/2021   Influenza, High Dose Seasonal PF 04/17/2018, 02/04/2019   Pneumococcal Conjugate-13 02/04/2019    Past Medical History:  Diagnosis Date   Acute myocardial infarction, unspecified site, episode of care unspecified    Adenocarcinoma, lung ([place]) 02/16/2021   Allergic rhinitis, cause unspecified    CAD (coronary artery disease)  GERD (gastroesophageal reflux disease)    Headache(784.0)    History of radiation therapy    left lung SBRT 02/08/2021,  02/13/2021, 02/15/2021  Dr Gery Pray   HTN (hypertension)    Other and unspecified hyperlipidemia    Other diseases of lung, not elsewhere classified    Postsurgical aortocoronary bypass status    hx of it.     Tobacco History: Social History   Tobacco Use  Smoking Status Some Days   Packs/day: 0.50   Years: 54.00   Pack years: 27.00   Types: Cigarettes   Start date: 1968  Smokeless Tobacco Never  Tobacco Comments   States she only smokes some when she gets nervous as of 01/02/21, 04/06/21    Ready to quit: Not Answered Counseling given: Not Answered Tobacco comments: States she only smokes some when she gets nervous as of 01/02/21, 04/06/21    Outpatient Medications Prior to Visit  Medication Sig Dispense Refill   acetaminophen (TYLENOL) 325 MG tablet Take 2 tablets (650 mg total) by mouth every 6 (six) hours as needed. 60 tablet 0   amLODipine (NORVASC) 10 MG tablet TAKE 1 TABLET(10 MG) BY MOUTH DAILY 90 tablet 2   atorvastatin (LIPITOR) 10 MG tablet TAKE 1 TABLET(10 MG) BY MOUTH DAILY 90 tablet 3   clopidogrel (PLAVIX) 75 MG tablet TAKE 1 TABLET(75 MG) BY MOUTH DAILY 90 tablet 3   cyclobenzaprine (FLEXERIL) 10 MG tablet Take 10 mg by mouth 3 (three) times daily as needed for muscle spasms.     famotidine (PEPCID) 20 MG tablet Take 20 mg by mouth at bedtime.     furosemide (LASIX) 20 MG tablet TAKE 1 TABLET(20 MG) BY MOUTH DAILY 90 tablet 1   isosorbide mononitrate (IMDUR) 60 MG 24 hr tablet Take 1 tablet (60 mg total) by mouth 2 (two) times daily. 60 tablet 6   lisinopril (ZESTRIL) 10 MG tablet TAKE 1 TABLET(10 MG) BY MOUTH DAILY 90 tablet 2   metoprolol tartrate (LOPRESSOR) 25 MG tablet TAKE 1 TABLET BY MOUTH TWICE DAILY 180 tablet 3   nitroGLYCERIN (NITROSTAT) 0.4 MG SL tablet Place 1 tablet (0.4 mg total) under the tongue every 5 (five) minutes as needed for chest pain (X3 DOSES MAX). 25 tablet 11   oxyCODONE-acetaminophen (PERCOCET) 7.5-325 MG tablet Take 1-2 tablets by  mouth every 4 (four) hours as needed for moderate pain (back pain).     pregabalin (LYRICA) 50 MG capsule Take 50 mg by mouth at bedtime as needed (leg pain).     ranolazine (RANEXA) 500 MG 12 hr tablet TAKE 1 TABLET BY MOUTH TWICE DAILY 180 tablet 1   Tiotropium Bromide Monohydrate (SPIRIVA RESPIMAT) 1.25 MCG/ACT AERS Inhale 2 puffs into the lungs daily. 4 g 0   No facility-administered medications prior to visit.     Review of Systems:   Constitutional: No weight loss or gain, night sweats, fevers, chills. +fatigue HEENT: No headaches, difficulty swallowing, tooth/dental problems, or sore throat. No ear ache +itchy/watery eyes, nasal congestion, rhinorrhea, postnasal drip.  CV: +lower extremity swelling (in PM only). No chest pain, PND, orthopnea, anasarca, dizziness, palpitations, syncope Resp: +shortness of breath with exertion (improved with Spiriva); nocturnal productive cough with white sputum. No excess mucus or change in color of mucus. No hemoptysis. No wheezing.  No chest wall deformity GI:  No heartburn, indigestion, abdominal pain, nausea, vomiting, diarrhea, change in bowel habits, loss of appetite, bloody stools.  GU: No dysuria, change in color of urine, urgency or  frequency.  No flank pain, no hematuria  Skin: No rash, lesions, ulcerations MSK:  No joint pain or swelling.  No decreased range of motion.  No back pain. Neuro: No dizziness or lightheadedness.  Psych: No depression or anxiety. Mood stable.     Physical Exam:  BP 128/66 (BP Location: Left Arm, Cuff Size: Normal)   Pulse 62   Temp 98.2 F (36.8 C) (Oral)   Ht 5\' 3"  (1.6 m)   Wt 157 lb (71.2 kg)   SpO2 100% Comment: on RA  BMI 27.81 kg/m   GEN: Pleasant, interactive, well-nourished; in no acute distress. HEENT:  Normocephalic and atraumatic. EACs patent bilaterally. TM pearly gray with present light reflex bilaterally. PERRLA. Sclera white. Nasal turbinates pink, moist and patent bilaterally. No  rhinorrhea present. Oropharynx pink and moist, without exudate or edema. No lesions, ulcerations, or postnasal drip.  NECK:  Supple w/ fair ROM. No JVD present. Normal carotid impulses w/o bruits. Thyroid symmetrical with no goiter or nodules palpated. No lymphadenopathy.   CV: RRR, no m/r/g, no peripheral edema. Pulses intact, +2 bilaterally. No cyanosis, pallor or clubbing. PULMONARY:  Unlabored, regular breathing. Clear bilaterally A&P w/o wheezes/rales/rhonchi. No accessory muscle use. No dullness to percussion. GI: BS present and normoactive. Soft, non-tender to palpation. No organomegaly or masses detected. No CVA tenderness. MSK: No erythema, warmth or tenderness. Cap refil <2 sec all extrem. No deformities or joint swelling noted.  Neuro: A/Ox3. No focal deficits noted.   Skin: Warm, no lesions or rashe Psych: Normal affect and behavior. Judgement and thought content appropriate.     Lab Results:  CBC    Component Value Date/Time   WBC 4.0 03/06/2021 0858   WBC 3.4 (L) 02/16/2021 0936   RBC 3.95 03/06/2021 0858   RBC 4.43 02/16/2021 0936   HGB 12.1 03/06/2021 0858   HCT 36.5 03/06/2021 0858   PLT 191 03/06/2021 0858   MCV 92 03/06/2021 0858   MCH 30.6 03/06/2021 0858   MCH 30.7 02/16/2021 0936   MCHC 33.2 03/06/2021 0858   MCHC 31.8 02/16/2021 0936   RDW 12.2 03/06/2021 0858   LYMPHSABS 1.6 02/16/2021 0936   MONOABS 0.3 02/16/2021 0936   EOSABS 0.1 02/16/2021 0936   BASOSABS 0.0 02/16/2021 0936    BMET    Component Value Date/Time   NA 141 03/06/2021 0858   K 4.2 03/06/2021 0858   CL 102 03/06/2021 0858   CO2 27 03/06/2021 0858   GLUCOSE 119 (H) 03/06/2021 0858   GLUCOSE 109 (H) 02/16/2021 0936   BUN 15 03/06/2021 0858   CREATININE 1.33 (H) 03/06/2021 0858   CREATININE 1.30 (H) 01/11/2021 1351   CALCIUM 9.5 03/06/2021 0858   GFRNONAA 53 (L) 02/16/2021 0936   GFRNONAA 43 (L) 01/11/2021 1351   GFRAA 53 (L) 08/16/2019 0959    BNP No results found for:  BNP   Imaging:  No results found.  regadenoson (LEXISCAN) injection SOLN 0.4 mg     Date Action Dose Route User   02/22/2021 1009 Given 0.4 mg Intravenous Hornowski Kubak, Elzbieta      technetium tetrofosmin (TC-MYOVIEW) injection 11 millicurie     Date Action Dose Route User   02/22/2021 0847 Contrast Given 11 millicurie Intravenous Hornowski Kubak, Elzbieta      technetium tetrofosmin (TC-MYOVIEW) injection 16.1 millicurie     Date Action Dose Route User   02/22/2021 1009 Contrast Given 09.6 millicurie Intravenous Hornowski Earl Many       PFT Results Latest  Ref Rng & Units 04/06/2021  FVC-Pre L 1.77  FVC-Predicted Pre % 83  FVC-Post L 1.70  FVC-Predicted Post % 79  Pre FEV1/FVC % % 72  Post FEV1/FCV % % 69  FEV1-Pre L 1.28  FEV1-Predicted Pre % 77  FEV1-Post L 1.17  DLCO uncorrected ml/min/mmHg 13.11  DLCO UNC% % 70  DLCO corrected ml/min/mmHg 13.69  DLCO COR %Predicted % 73  DLVA Predicted % 110  TLC L 3.68  TLC % Predicted % 75  RV % Predicted % 82    No results found for: NITRICOXIDE   Walking oximetry today: completed 250 ft -limited by back pain; no desaturation; SpO2 low 98% on room air   Assessment & Plan:   Stage 2 moderate COPD by GOLD classification ([place]) Improvement in SOB with initiation of Spiriva. FEV1/FVC ratio 69 with FEV1 77%, FVC 83%, TLC 75, DLCO corrected 73 consistent with stage 2 moderate COPD by GOLD. Continue Spiriva. Educated on rescue inhaler usage. Cough likely related to comorbidites as well as post nasal drip. Tx for allergic rhinitis initiated with Flonase and Claritin daily. Mucinex Twice daily for chest congestion/phlegm. Supportive care.   Patient Instructions  -Continue Spiriva 2 puffs daily -Continue albuterol inhaler 2 puffs every 6 hours as needed for shortness of breath or wheezing  -Saline nasal spray 2-3 times a day as needed for nasal congestion/postnasal drip until symptoms improve -Mucinex 600 mg  twice daily -Fluticasone (Flonase) nasal spray 1-2 sprays each nostril daily  -Claritin (loratidine) 10 mg daily for allergy symptoms -Chlortab 4mg  over the counter at night as needed for cough   Your pulmonary function testing showed moderate obstruction and a slight diffusion defect, consistent with moderate COPD.  Walk oximetry today SpO2 low 98%. Limited by back pain.  Continue to follow up for your radiation treatments and with your oncologist as scheduled.   TED hose for lower extremity swelling. Notify your cardiologist if swelling persists or if you develop palpitations, chest pain/tightness, or increased weight gain.   Notify if worsening breathlessness, cough, mucus production, fatigue, or wheezing occurs.  Maintain up to date vaccinations, including influenza, COVID, and pneumococcal.  Wash your hands often and avoid sick exposures.  Encouraged masking in crowds.  Smoking cessation strongly encouraged and discussed.  Avoid triggers, when possible.  Exercise, as tolerated. Notify if worsening symptoms upon exertion occur.   Follow up in 3 months with Dr. Lamonte Sakai, Roxan Diesel, NP or APP. If symptoms do not improve or worsen, please contact office for sooner follow up or seek emergency care.    Seasonal allergic rhinitis See above plan.   Tobacco use Strongly encouraged smoking cessation given lung cancer and COPD diagnoses. Reported only smoking when she gets nervous. High stress situation with radiation treatment. Will reassess usage at next visit and discuss possible cessation treatment.  Adenocarcinoma, lung ([place]) Currently undergoing SBRT left lower lobe. Plans to re-evaluate right lower lobe nodule with possible SBRT. Followed by oncology. See above plan.  Essential hypertension PM lower extremity swelling. No s/s of fluid overload upon exam today. TED hose advised. Advised to follow up with cardiology if problem persists. See above plan.      Clayton Bibles,  NP 04/06/2021  Pt aware and understands NP's role.

## 2021-04-06 NOTE — Assessment & Plan Note (Signed)
Improvement in SOB with initiation of Spiriva. FEV1/FVC ratio 69 with FEV1 77%, FVC 83%, TLC 75, DLCO corrected 73 consistent with stage 2 moderate COPD by GOLD. Continue Spiriva. Educated on rescue inhaler usage. Cough likely related to comorbidites as well as post nasal drip. Tx for allergic rhinitis initiated with Flonase and Claritin daily. Mucinex Twice daily for chest congestion/phlegm. Supportive care.   Patient Instructions  -Continue Spiriva 2 puffs daily -Continue albuterol inhaler 2 puffs every 6 hours as needed for shortness of breath or wheezing  -Saline nasal spray 2-3 times a day as needed for nasal congestion/postnasal drip until symptoms improve -Mucinex 600 mg twice daily -Fluticasone (Flonase) nasal spray 1-2 sprays each nostril daily  -Claritin (loratidine) 10 mg daily for allergy symptoms -Chlortab 4mg  over the counter at night as needed for cough   Your pulmonary function testing showed moderate obstruction and a slight diffusion defect, consistent with moderate COPD.  Walk oximetry today SpO2 low 98%. Limited by back pain.  Continue to follow up for your radiation treatments and with your oncologist as scheduled.   TED hose for lower extremity swelling. Notify your cardiologist if swelling persists or if you develop palpitations, chest pain/tightness, or increased weight gain.   Notify if worsening breathlessness, cough, mucus production, fatigue, or wheezing occurs.  Maintain up to date vaccinations, including influenza, COVID, and pneumococcal.  Wash your hands often and avoid sick exposures.  Encouraged masking in crowds.  Smoking cessation strongly encouraged and discussed.  Avoid triggers, when possible.  Exercise, as tolerated. Notify if worsening symptoms upon exertion occur.   Follow up in 3 months with Dr. Lamonte Sakai, Roxan Diesel, NP or APP. If symptoms do not improve or worsen, please contact office for sooner follow up or seek emergency care.

## 2021-04-06 NOTE — Assessment & Plan Note (Signed)
PM lower extremity swelling. No s/s of fluid overload upon exam today. TED hose advised. Advised to follow up with cardiology if problem persists. See above plan.

## 2021-04-06 NOTE — Assessment & Plan Note (Signed)
Currently undergoing SBRT left lower lobe. Plans to re-evaluate right lower lobe nodule with possible SBRT. Followed by oncology. See above plan.

## 2021-04-26 ENCOUNTER — Other Ambulatory Visit: Payer: Self-pay | Admitting: Interventional Cardiology

## 2021-05-09 ENCOUNTER — Other Ambulatory Visit: Payer: Self-pay | Admitting: Orthopedic Surgery

## 2021-05-09 DIAGNOSIS — M5451 Vertebrogenic low back pain: Secondary | ICD-10-CM

## 2021-05-11 ENCOUNTER — Telehealth: Payer: Self-pay | Admitting: Physician Assistant

## 2021-05-11 ENCOUNTER — Other Ambulatory Visit: Payer: Self-pay | Admitting: Physician Assistant

## 2021-05-11 ENCOUNTER — Other Ambulatory Visit: Payer: Medicare Other

## 2021-05-11 DIAGNOSIS — C3492 Malignant neoplasm of unspecified part of left bronchus or lung: Secondary | ICD-10-CM

## 2021-05-11 NOTE — Progress Notes (Signed)
I attempted to call this patient. Both of her voicemails are full so I was unable to leave a message. Dr. Julien Nordmann had ordered a CT scan with the expected date of 05/11/21. This was actually performed on 01/26/21. I was calling her to let her know Dr. Julien Nordmann would like another CT of the chest to assess for treatment response before her office visit with [place]. Therefore, I will need to reschedule her appointment until after her scan is scheduled. I see she is getting a CT next week on 05/17/21 for her lumbar spine. I will reach out to the authorization team to see if her CT of the chest will be authorized by then and if they can perform them on the same day. Hoping to get in touch with the patient to relay this plan. Will try calling her again.

## 2021-05-11 NOTE — Telephone Encounter (Signed)
Sch per 1/13 inbasket, spoke with pt and she confirmed. Pt aware

## 2021-05-14 ENCOUNTER — Ambulatory Visit: Payer: Medicare Other | Admitting: Internal Medicine

## 2021-05-14 ENCOUNTER — Ambulatory Visit: Payer: Medicare Other | Admitting: Physician Assistant

## 2021-05-14 ENCOUNTER — Telehealth: Payer: Self-pay | Admitting: Physician Assistant

## 2021-05-14 NOTE — Telephone Encounter (Signed)
Rescheduled appointment per 1/16 scheduling message. Patient is aware. Patient will be mailed an updated calendar.

## 2021-05-17 ENCOUNTER — Other Ambulatory Visit: Payer: Commercial Managed Care - HMO

## 2021-05-18 ENCOUNTER — Other Ambulatory Visit: Payer: Self-pay | Admitting: *Deleted

## 2021-05-18 DIAGNOSIS — J449 Chronic obstructive pulmonary disease, unspecified: Secondary | ICD-10-CM

## 2021-05-18 MED ORDER — ATORVASTATIN CALCIUM 10 MG PO TABS: ORAL_TABLET | ORAL | 1 refills | Status: DC

## 2021-05-18 MED ORDER — CLOPIDOGREL BISULFATE 75 MG PO TABS: ORAL_TABLET | ORAL | 1 refills | Status: DC

## 2021-05-18 MED ORDER — ALBUTEROL SULFATE HFA 108 (90 BASE) MCG/ACT IN AERS: 2.0000 | INHALATION_SPRAY | Freq: Four times a day (QID) | RESPIRATORY_TRACT | 3 refills | Status: DC | PRN

## 2021-05-21 ENCOUNTER — Other Ambulatory Visit: Payer: Self-pay | Admitting: *Deleted

## 2021-05-21 DIAGNOSIS — J449 Chronic obstructive pulmonary disease, unspecified: Secondary | ICD-10-CM

## 2021-05-21 MED ORDER — ALBUTEROL SULFATE HFA 108 (90 BASE) MCG/ACT IN AERS: 2.0000 | INHALATION_SPRAY | Freq: Four times a day (QID) | RESPIRATORY_TRACT | 3 refills | Status: DC | PRN

## 2021-05-22 ENCOUNTER — Ambulatory Visit: Payer: Medicare Other | Admitting: Physician Assistant

## 2021-05-22 ENCOUNTER — Ambulatory Visit
Admission: RE | Admit: 2021-05-22 | Discharge: 2021-05-22 | Disposition: A | Discharge status: Home / Self Care | Payer: Medicare Other | Source: Ambulatory Visit | Attending: Orthopedic Surgery | Admitting: Orthopedic Surgery

## 2021-05-22 ENCOUNTER — Other Ambulatory Visit: Payer: Medicare Other

## 2021-05-22 DIAGNOSIS — M5451 Vertebrogenic low back pain: Secondary | ICD-10-CM

## 2021-05-22 MED ORDER — ONDANSETRON HCL 4 MG/2ML IJ SOLN: 4.0000 mg | Freq: Once | INTRAMUSCULAR | Status: DC | PRN

## 2021-05-22 MED ORDER — IOPAMIDOL (ISOVUE-M 200) INJECTION 41%
70.0000 mL | Freq: Once | INTRAMUSCULAR | Status: AC
  Administered 2021-05-22: 1 mL via EPIDURAL

## 2021-05-22 MED ORDER — MEPERIDINE HCL 50 MG/ML IJ SOLN: 50.0000 mg | Freq: Once | INTRAMUSCULAR | Status: DC | PRN

## 2021-05-22 MED ORDER — DIAZEPAM 5 MG PO TABS
5.0000 mg | ORAL_TABLET | Freq: Once | ORAL | Status: AC
  Administered 2021-05-22: 5 mg via ORAL

## 2021-05-22 NOTE — Discharge Instructions (Signed)

## 2021-05-28 ENCOUNTER — Ambulatory Visit (INDEPENDENT_AMBULATORY_CARE_PROVIDER_SITE_OTHER): Payer: Medicare Other

## 2021-05-28 DIAGNOSIS — I4901 Ventricular fibrillation: Secondary | ICD-10-CM

## 2021-05-28 LAB — CUP PACEART REMOTE DEVICE CHECK
Battery Remaining Longevity: 66 mo
Battery Remaining Percentage: 71 %
Brady Statistic RV Percent Paced: 0 %
Date Time Interrogation Session: 20230130044100
HighPow Impedance: 72 Ohm
Implantable Lead Implant Date: 20071205
Implantable Lead Location: 753860
Implantable Lead Model: 137
Implantable Lead Serial Number: 102363
Implantable Pulse Generator Implant Date: 20141105
Lead Channel Impedance Value: 431 Ohm
Lead Channel Pacing Threshold Amplitude: 0.7 V
Lead Channel Pacing Threshold Pulse Width: 0.5 ms
Lead Channel Setting Pacing Amplitude: 2.4 V
Lead Channel Setting Pacing Pulse Width: 0.5 ms
Lead Channel Setting Sensing Sensitivity: 0.6 mV
Pulse Gen Serial Number: 126146

## 2021-05-29 ENCOUNTER — Other Ambulatory Visit: Payer: Self-pay | Admitting: *Deleted

## 2021-05-29 MED ORDER — METOPROLOL TARTRATE 25 MG PO TABS: ORAL_TABLET | ORAL | 3 refills | Status: DC

## 2021-06-04 ENCOUNTER — Ambulatory Visit
Admission: RE | Admit: 2021-06-04 | Discharge: 2021-06-04 | Disposition: A | Discharge status: Home / Self Care | Payer: Medicare Other | Source: Ambulatory Visit | Attending: Physician Assistant | Admitting: Physician Assistant

## 2021-06-04 ENCOUNTER — Other Ambulatory Visit: Payer: Self-pay

## 2021-06-04 DIAGNOSIS — C3492 Malignant neoplasm of unspecified part of left bronchus or lung: Secondary | ICD-10-CM

## 2021-06-04 MED ORDER — IOPAMIDOL (ISOVUE-300) INJECTION 61%
75.0000 mL | Freq: Once | INTRAVENOUS | Status: AC | PRN
  Administered 2021-06-04: 75 mL via INTRAVENOUS

## 2021-06-04 NOTE — Progress Notes (Signed)
[place], [facility] [place] [address]  DIAGNOSIS: Stage IB (T1b, N0, M0) non-small cell lung cancer, adenocarcinoma presented with left lower lobe lung nodule diagnosed in September 2022.  PRIOR THERAPY: SBRT under the care of Dr. Sondra Come. Last treatment on 02/15/21.   CURRENT THERAPY: Observation   INTERVAL HISTORY: Karen [name] 76 y.o. female returns to the clinic today for a follow-up visit. The patient is feeling fairly well today without any concerning complaints except for she has a lot if orthopedic problems in her lumbar spine. She recently had a CT of the lumbar spine and myelography. She notes she has been having some weakness in her legs and has been ambulating with a cane more this past month. She mentions she is supposed to see a neurologist on 06/27/21 regarding this.   The patient was first seen in clinic in September 2022 with stage Ib non-small cell lung cancer, adenocarcinoma.  The patient is not a good candidate for surgical resection due to her COPD and cardiac condition; therefore, she was referred to radiation oncology.  She saw Dr. Sondra Come and received SBRT to the stage I non-small cell lung cancer and her last treatment was on 02/15/2021.  She tolerated this well without any concerning adverse side effects.  Overall, today, the patient is feeling fair.  She denies any fever, chills, or unexplained weight loss.  She had one episode of night sweats in her life and none recently. She sometimes has a sharp pain in her lateral left chest and chest discomfort related to heart burn. She reports she is prescribed medication for heartburn. She reports baseline dyspnea on exertion. She reports cough that comes and goes. Denies any hemoptysis.  Denies any nausea, vomiting, diarrhea, or constipation.  She recently had a restaging CT scan performed.  She is here today for evaluation to review her scan  results.   MEDICAL HISTORY: Past Medical History:  Diagnosis Date   Acute myocardial infarction, unspecified site, episode of care unspecified    Adenocarcinoma, lung ([place]) 02/16/2021   Allergic rhinitis, cause unspecified    CAD (coronary artery disease)    GERD (gastroesophageal reflux disease)    Headache(784.0)    History of radiation therapy    left lung SBRT 02/08/2021, 02/13/2021, 02/15/2021  Dr Gery Pray   HTN (hypertension)    Other and unspecified hyperlipidemia    Other diseases of lung, not elsewhere classified    Postsurgical aortocoronary bypass status    hx of it.     ALLERGIES:  is allergic to codeine and percocet [oxycodone-acetaminophen].  MEDICATIONS:  Current Outpatient Medications  Medication Sig Dispense Refill   acetaminophen (TYLENOL) 325 MG tablet Take 2 tablets (650 mg total) by mouth every 6 (six) hours as needed. 60 tablet 0   albuterol (VENTOLIN HFA) 108 (90 Base) MCG/ACT inhaler Inhale 2 puffs into the lungs every 6 (six) hours as needed for wheezing or shortness of breath. 54 g 3   amLODipine (NORVASC) 10 MG tablet TAKE 1 TABLET(10 MG) BY MOUTH DAILY 90 tablet 2   atorvastatin (LIPITOR) 10 MG tablet TAKE 1 TABLET(10 MG) BY MOUTH DAILY 90 tablet 1   clopidogrel (PLAVIX) 75 MG tablet TAKE 1 TABLET(75 MG) BY MOUTH DAILY 90 tablet 1   cyclobenzaprine (FLEXERIL) 10 MG tablet Take 10 mg by mouth 3 (three) times daily as needed for muscle spasms.     famotidine (PEPCID) 20 MG tablet Take 20 mg  by mouth at bedtime.     fluticasone (FLONASE) 50 MCG/ACT nasal spray Place 1 spray into both nostrils daily. 18.2 mL 2   furosemide (LASIX) 20 MG tablet TAKE 1 TABLET(20 MG) BY MOUTH DAILY 90 tablet 1   guaiFENesin (MUCINEX) 600 MG 12 hr tablet Take 1 tablet (600 mg total) by mouth 2 (two) times daily. 60 tablet 3   isosorbide mononitrate (IMDUR) 60 MG 24 hr tablet Take 1 tablet (60 mg total) by mouth 2 (two) times daily. 60 tablet 6   lisinopril (ZESTRIL) 10 MG  tablet TAKE 1 TABLET(10 MG) BY MOUTH DAILY 90 tablet 2   loratadine (CLARITIN) 10 MG tablet Take 1 tablet (10 mg total) by mouth daily. 30 tablet 11   metoprolol tartrate (LOPRESSOR) 25 MG tablet TAKE 1 TABLET BY MOUTH TWICE DAILY 180 tablet 3   nitroGLYCERIN (NITROSTAT) 0.4 MG SL tablet Place 1 tablet (0.4 mg total) under the tongue every 5 (five) minutes as needed for chest pain (X3 DOSES MAX). 25 tablet 11   oxyCODONE-acetaminophen (PERCOCET) 7.5-325 MG tablet Take 1-2 tablets by mouth every 4 (four) hours as needed for moderate pain (back pain).     pregabalin (LYRICA) 50 MG capsule Take 50 mg by mouth at bedtime as needed (leg pain).     ranolazine (RANEXA) 500 MG 12 hr tablet TAKE 1 TABLET BY MOUTH TWICE DAILY 180 tablet 3   Tiotropium Bromide Monohydrate (SPIRIVA RESPIMAT) 1.25 MCG/ACT AERS Inhale 2 puffs into the lungs daily. 4 g 0   No current facility-administered medications for this visit.    SURGICAL HISTORY:  Past Surgical History:  Procedure Laterality Date   CARDIAC DEFIBRILLATOR PLACEMENT  03/2006   [employer]. remote-yes.    CORONARY ARTERY BYPASS GRAFT     x4   IMPLANTABLE CARDIOVERTER DEFIBRILLATOR GENERATOR CHANGE N/A 03/03/2013   Procedure: IMPLANTABLE CARDIOVERTER DEFIBRILLATOR GENERATOR CHANGE;  Surgeon: Deboraha Sprang, MD;  Location: [facility] CATH LAB;  Service: Cardiovascular;  Laterality: N/A;   stent implant     x1. possibly x2.     REVIEW OF SYSTEMS:   Review of Systems  Constitutional: Negative for appetite change, chills, fatigue, fever and unexpected weight change.  HENT: Negative for mouth sores, nosebleeds, sore throat and trouble swallowing.   Eyes: Negative for eye problems and icterus.  Respiratory: Positive for intermittent cough. Positive for baseline dyspnea on exertion. Negative for hemoptysis and wheezing.  Cardiovascular: Positive for occasional sternal and left lateral rib pain. Negative for leg swelling.  Gastrointestinal: Negative for abdominal  pain, constipation, diarrhea, nausea and vomiting.  Genitourinary: Negative for bladder incontinence, difficulty urinating, dysuria, frequency and hematuria.   Musculoskeletal: Positive for chronic low back pain. Negative for neck pain and neck stiffness.  Skin: Negative for itching and rash.  Neurological: Positive for progressive leg weakness bilaterally. Negative for dizziness, light-headedness and seizures.  Hematological: Negative for adenopathy. Does not bruise/bleed easily.  Psychiatric/Behavioral: Negative for confusion, depression and sleep disturbance. The patient is not nervous/anxious.     PHYSICAL EXAMINATION:  Blood pressure 133/67, pulse 66, temperature 98.6 F (37 C), temperature source Tympanic, resp. rate 17, weight 155 lb 4.8 oz (70.4 kg), SpO2 97 %.  ECOG PERFORMANCE STATUS: 1  Physical Exam  Constitutional: Oriented to person, place, and time and well-developed, well-nourished, and in no distress. HENT:  Head: Normocephalic and atraumatic.  Mouth/Throat: Oropharynx is clear and moist. No oropharyngeal exudate.  Eyes: Conjunctivae are normal. Right eye exhibits no discharge. Left eye exhibits no discharge.  No scleral icterus.  Neck: Normal range of motion. Neck supple.  Cardiovascular: Normal rate, regular rhythm, normal heart sounds and intact distal pulses.   Pulmonary/Chest: Effort normal and breath sounds normal. No respiratory distress. No wheezes. No rales.  Abdominal: Soft. Bowel sounds are normal. Exhibits no distension and no mass. There is no tenderness.  Musculoskeletal: Normal range of motion. Exhibits no edema.  Lymphadenopathy:    No cervical adenopathy.  Neurological: Alert and oriented to person, place, and time. Exhibits normal muscle tone. Ambulates with a cane.  Skin: Skin is warm and dry. No rash noted. Not diaphoretic. No erythema. No pallor.  Psychiatric: Mood, memory and judgment normal.  Vitals reviewed.  LABORATORY DATA: Lab Results   Component Value Date   WBC 3.3 (L) 06/06/2021   HGB 11.9 (L) 06/06/2021   HCT 36.4 06/06/2021   MCV 93.8 06/06/2021   PLT 191 06/06/2021      Chemistry      Component Value Date/Time   NA 141 06/06/2021 1114   NA 141 03/06/2021 0858   K 3.7 06/06/2021 1114   CL 105 06/06/2021 1114   CO2 30 06/06/2021 1114   BUN 12 06/06/2021 1114   BUN 15 03/06/2021 0858   CREATININE 1.23 (H) 06/06/2021 1114      Component Value Date/Time   CALCIUM 9.2 06/06/2021 1114   ALKPHOS 63 06/06/2021 1114   AST 11 (L) 06/06/2021 1114   ALT 11 06/06/2021 1114   BILITOT 0.5 06/06/2021 1114       RADIOGRAPHIC STUDIES:  CT Chest W Contrast  Result Date: 06/05/2021 CLINICAL DATA:  76 year old female with history of non-small cell lung cancer. Evaluate for treatment response. Additional history of ovarian cancer. EXAM: CT CHEST WITH CONTRAST TECHNIQUE: Multidetector CT imaging of the chest was performed during intravenous contrast administration. RADIATION DOSE REDUCTION: This exam was performed according to the departmental dose-optimization program which includes automated exposure control, adjustment of the mA and/or kV according to patient size and/or use of iterative reconstruction technique. CONTRAST:  61mL ISOVUE-300 IOPAMIDOL (ISOVUE-300) INJECTION 61% COMPARISON:  Chest CT 01/26/2021. FINDINGS: Cardiovascular: Heart size is borderline enlarged. There is no significant pericardial fluid, thickening or pericardial calcification. There is aortic atherosclerosis, as well as atherosclerosis of the great vessels of the mediastinum and the coronary arteries, including calcified atherosclerotic plaque in the left main, left anterior descending, left circumflex and right coronary arteries. Status post median sternotomy for CABG including LIMA to the LAD. Left-sided pacemaker/AICD device in place with lead tip terminating in the right ventricular apex. Mediastinum/Nodes: No pathologically enlarged mediastinal or  hilar lymph nodes. Esophagus is unremarkable in appearance. No axillary lymphadenopathy. Lungs/Pleura: Previously noted sub solid nodule in the superior segment of the right lower lobe has increased in size, currently measuring 1.9 x 1.7 x 1.7 cm (axial image 77 of series 8 and coronal image 112 of series 4). This lesion makes contact with the pleura posteriorly which appears slightly retracted toward the lesion. Increasingly conspicuous 1.1 x 0.6 cm nodular area of architectural distortion in the posteromedial aspect of the right lower lobe (axial image 125 of series 8). Previously noted solid-appearing nodule in the superior segment of the left lower lobe (axial image 60 of series 8 and sagittal image 110 of series 6) currently measures 1.7 x 1.0 x 1.9 cm, slightly increased from the prior examination (previously 14 x 9 mm). No acute consolidative airspace disease. No pleural effusions. Upper Abdomen: Aortic atherosclerosis. Subcentimeter low-attenuation lesion in segment 6  of the liver, too small to characterize, but similar to the prior examination and statistically likely to represent a tiny cyst. Status post cholecystectomy. Musculoskeletal: Median sternotomy wires. There are no aggressive appearing lytic or blastic lesions noted in the visualized portions of the skeleton. IMPRESSION: 1. Both lower lobe nodules have increased slightly in size, as detailed above, and there is an increasingly conspicuous nodular area of architectural distortion in the posteromedial aspect of the right lower lobe as well. These nodules remain concerning for neoplasm(s), and close attention on follow-up studies is once again recommended. 2. Aortic atherosclerosis, in addition to left main and three-vessel coronary artery disease. Status post median sternotomy for CABG including LIMA to the LAD. Aortic Atherosclerosis (ICD10-I70.0). Electronically Signed   By: Vinnie Langton M.D.   On: 06/05/2021 05:29   CT LUMBAR SPINE W  CONTRAST  Result Date: 05/22/2021 CLINICAL DATA:  Vertebrogenic low back pain. Pain in the hips and legs, right greater than left. EXAM: LUMBAR MYELOGRAM FLUOROSCOPY TIME:  Fluoroscopy Time: 2 minutes 12 seconds Radiation Exposure Index: 430.14 microGray*m^2 PROCEDURE: After thorough discussion of risks and benefits of the procedure including bleeding, infection, injury to nerves, blood vessels, adjacent structures as well as headache and CSF leak, written and oral informed consent was obtained. Consent was obtained by Dr. Logan Bores. Time out form was completed. Patient was positioned prone on the fluoroscopy table. Local anesthesia was provided with 1% lidocaine without epinephrine after prepped and draped in the usual sterile fashion. Puncture was performed at L5-S1 using a 5 inch 22-gauge spinal needle via a right interlaminar approach. Using a single pass through the dura, the needle was placed within the thecal sac, with return of clear CSF. 15 mL of Isovue M-200 was injected into the thecal sac, with normal opacification of the nerve roots and cauda equina consistent with free flow within the subarachnoid space. I personally performed the lumbar puncture and administered the intrathecal contrast. I also personally supervised acquisition of the myelogram images. TECHNIQUE: Contiguous axial images were obtained through the Lumbar spine after the intrathecal infusion of contrast. Coronal and sagittal reconstructions were obtained of the axial image sets. COMPARISON:  Noncontrast lumbar spine CT 05/04/2019 FINDINGS: LUMBAR MYELOGRAM FINDINGS: There are 5 non rib-bearing lumbar vertebrae. Grade 1 anterolisthesis of L4 on L5 does not change with flexion or extension. There is evidence of moderate spinal stenosis at L3-4 and severe spinal stenosis at L4-5 with effacement of the left L4 and bilateral L5 nerve roots. CT LUMBAR MYELOGRAM FINDINGS: Facet mediated anterolisthesis of L4 on L5 measures 4 mm and has  increased from the prior CT. There is unchanged trace retrolisthesis of L5 on S1. No fracture or suspicious osseous lesion is identified. The conus medullaris terminates at L1. Cholecystectomy and abdominal aortic atherosclerosis are noted. T12-L1: Minimal disc bulging and mild facet and ligamentum flavum hypertrophy without stenosis. L1-2: Moderate facet and ligamentum flavum hypertrophy without disc herniation or stenosis. L2-3: Mild disc bulging and moderate facet and ligamentum flavum hypertrophy result in mild left lateral recess stenosis and mild left greater than right neural foraminal stenosis without spinal stenosis, similar to the prior CT. L3-4: Circumferential disc bulging and severe facet and ligamentum flavum hypertrophy result in moderate spinal stenosis, mild right and mild-to-moderate left lateral recess stenosis, and mild-to-moderate bilateral neural foraminal stenosis, similar to the prior CT. L4-5: Anterolisthesis with bulging uncovered disc and severe facet and ligamentum flavum hypertrophy result in severe spinal stenosis (6 mm AP spinal canal diameter) and  moderate bilateral neural foraminal stenosis, similar to the prior CT. L5-S1: Moderate disc space narrowing with vacuum disc. Disc bulging, endplate spurring, and moderate facet hypertrophy result in mild left lateral recess stenosis and mild-to-moderate bilateral neural foraminal stenosis without spinal stenosis, similar to the prior CT. IMPRESSION: 1. 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 2. Moderate spinal stenosis at L3-4. 3. Mild-to-moderate neural foraminal stenosis at L3-4 and L5-S1. 4. Aortic Atherosclerosis (ICD10-I70.0). Electronically Signed   By: Logan Bores M.D.   On: 05/22/2021 13:12   DG MYELOGRAPHY LUMBAR INJ LUMBOSACRAL  Result Date: 05/22/2021 CLINICAL DATA:  Vertebrogenic low back pain. Pain in the hips and legs, right  greater than left. EXAM: LUMBAR MYELOGRAM FLUOROSCOPY TIME:  Fluoroscopy Time: 2 minutes 12 seconds Radiation Exposure Index: 430.14 microGray*m^2 PROCEDURE: After thorough discussion of risks and benefits of the procedure including bleeding, infection, injury to nerves, blood vessels, adjacent structures as well as headache and CSF leak, written and oral informed consent was obtained. Consent was obtained by Dr. Logan Bores. Time out form was completed. Patient was positioned prone on the fluoroscopy table. Local anesthesia was provided with 1% lidocaine without epinephrine after prepped and draped in the usual sterile fashion. Puncture was performed at L5-S1 using a 5 inch 22-gauge spinal needle via a right interlaminar approach. Using a single pass through the dura, the needle was placed within the thecal sac, with return of clear CSF. 15 mL of Isovue M-200 was injected into the thecal sac, with normal opacification of the nerve roots and cauda equina consistent with free flow within the subarachnoid space. I personally performed the lumbar puncture and administered the intrathecal contrast. I also personally supervised acquisition of the myelogram images. TECHNIQUE: Contiguous axial images were obtained through the Lumbar spine after the intrathecal infusion of contrast. Coronal and sagittal reconstructions were obtained of the axial image sets. COMPARISON:  Noncontrast lumbar spine CT 05/04/2019 FINDINGS: LUMBAR MYELOGRAM FINDINGS: There are 5 non rib-bearing lumbar vertebrae. Grade 1 anterolisthesis of L4 on L5 does not change with flexion or extension. There is evidence of moderate spinal stenosis at L3-4 and severe spinal stenosis at L4-5 with effacement of the left L4 and bilateral L5 nerve roots. CT LUMBAR MYELOGRAM FINDINGS: Facet mediated anterolisthesis of L4 on L5 measures 4 mm and has increased from the prior CT. There is unchanged trace retrolisthesis of L5 on S1. No fracture or suspicious osseous  lesion is identified. The conus medullaris terminates at L1. Cholecystectomy and abdominal aortic atherosclerosis are noted. T12-L1: Minimal disc bulging and mild facet and ligamentum flavum hypertrophy without stenosis. L1-2: Moderate facet and ligamentum flavum hypertrophy without disc herniation or stenosis. L2-3: Mild disc bulging and moderate facet and ligamentum flavum hypertrophy result in mild left lateral recess stenosis and mild left greater than right neural foraminal stenosis without spinal stenosis, similar to the prior CT. L3-4: Circumferential disc bulging and severe facet and ligamentum flavum hypertrophy result in moderate spinal stenosis, mild right and mild-to-moderate left lateral recess stenosis, and mild-to-moderate bilateral neural foraminal stenosis, similar to the prior CT. L4-5: Anterolisthesis with bulging uncovered disc and severe facet and ligamentum flavum hypertrophy result in severe spinal stenosis (6 mm AP spinal canal diameter) and moderate bilateral neural foraminal stenosis, similar to the prior CT. L5-S1: Moderate disc space narrowing with vacuum disc. Disc bulging, endplate spurring, and moderate facet hypertrophy result in mild left lateral recess stenosis and mild-to-moderate bilateral neural foraminal  stenosis without spinal stenosis, similar to the prior CT. IMPRESSION: 1. 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 2. Moderate spinal stenosis at L3-4. 3. Mild-to-moderate neural foraminal stenosis at L3-4 and L5-S1. 4. Aortic Atherosclerosis (ICD10-I70.0). Electronically Signed   By: Logan Bores M.D.   On: 05/22/2021 13:12   CUP PACEART REMOTE DEVICE CHECK  Result Date: 05/28/2021 Scheduled remote reviewed. Normal device function.  1 NSVT, 5 beats Next remote 91 days. [place]    ASSESSMENT/PLAN:   This is a very pleasant 76 year old African-American female diagnosed with a stage IB  (T1b, N0, M0) non-small cell lung cancer, adenocarcinoma. She presented with left lower lobe lung nodule diagnosed in September 2022.  The patient was not a candidate for surgical resection due to her COPD and cardiac condition.  She underwent SBRT to the left lower lobe nodule under the care of Dr. Sondra Come which was completed on 02/15/2021.  The patient is here today for evaluation to review her scan results.  The patient was seen by Dr. Julien Nordmann today.  Dr. Julien Nordmann personally and independently reviewed the scan and discussed the results with the patient today.  The scan showed that the lower lobe lung nodules may have slightly increased in size as well as have nodular area of architectural distortion.  Dr. Julien Nordmann feels that this is likely secondary to radiation changes from her recent SBRT to these lesions.  Dr. Julien Nordmann recommends continuing on observation with close monitoring of this area to ensure no further enlargement.  Dr. Julien Nordmann recommends that we arrange for a repeat CT scan the chest in 6 months  We will see her back for follow-up visit at that time for evaluation and to review her scan results.  Regarding the back pain and weakness in the leg, the patient recently had a CT of the lumbar spine as well as myelography of the lumbar spine which showed severe facet arthrosis at L4/5, severe spinal stenosis, and moderate bilateral neuroforaminal stenosis.  Discussed with the patient this is likely causing her gait changes in her lower extremity.  She is seeing neurology next month for this and I strongly encouraged her to keep her appointment.  I discussed with her that this is not related to her history of stage I lung cancer.  The patient was advised to call immediately if she has any concerning symptoms in the interval. The patient voices understanding of current disease status and treatment options and is in agreement with the current care plan. All questions were answered. The patient  knows to call the clinic with any problems, questions or concerns. We can certainly see the patient much sooner if necessary   Orders Placed This Encounter  Procedures   CT Chest W Contrast    Standing Status:   Future    Standing Expiration Date:   06/06/2022    Order Specific Question:   If indicated for the ordered procedure, I authorize the administration of contrast media per Radiology protocol    Answer:   Yes    Order Specific Question:   Preferred imaging location?    Answer:   [facility]   CBC with Differential ([place] Only)    Standing Status:   Future    Standing Expiration Date:   06/06/2022   CMP ([place] only)    Standing Status:   Future    Standing Expiration Date:   06/06/2022  Karen [name] L Karen Desrosier, PA-C 06/06/21  ADDENDUM: Hematology/Oncology Attending: I had a face-to-face encounter with the patient today.  I reviewed her record, lab, scan and recommended her care plan.  This is a very pleasant 76 years old African-American female diagnosed with a stage IA (T1b, N0, M0) non-small cell lung cancer, adenocarcinoma presented with left lower lobe lung nodule diagnosed in September 2022 status post SBRT under the care of Dr. Sondra Come completed February 15, 2021. The patient is currently on observation and she is feeling fine. She had repeat CT scan of the chest performed recently.  I personally and independently reviewed the scans and discussed the results with the patient today. Her scan showed increase in the density of the previously radiated lesion which could be secondary to fibrosis and scarring in the area. I recommended for her to continue on observation with repeat CT scan of the chest in 6 months. The patient was advised to call immediately if she has any other concerning symptoms in the interval. Disclaimer: This note was dictated with voice recognition software. Similar sounding words can inadvertently be transcribed and may be missed  upon review. Eilleen Kempf, MD 06/06/21

## 2021-06-05 NOTE — Progress Notes (Signed)
Remote ICD transmission.   

## 2021-06-06 ENCOUNTER — Other Ambulatory Visit: Payer: Self-pay

## 2021-06-06 ENCOUNTER — Inpatient Hospital Stay: Payer: Medicare Other | Attending: Physician Assistant

## 2021-06-06 ENCOUNTER — Inpatient Hospital Stay (HOSPITAL_BASED_OUTPATIENT_CLINIC_OR_DEPARTMENT_OTHER): Payer: Medicare Other | Admitting: Physician Assistant

## 2021-06-06 VITALS — BP 133/67 | HR 66 | Temp 98.6°F | Resp 17 | Wt 155.3 lb

## 2021-06-06 DIAGNOSIS — R531 Weakness: Secondary | ICD-10-CM | POA: Diagnosis not present

## 2021-06-06 DIAGNOSIS — Z79899 Other long term (current) drug therapy: Secondary | ICD-10-CM | POA: Diagnosis not present

## 2021-06-06 DIAGNOSIS — C3492 Malignant neoplasm of unspecified part of left bronchus or lung: Secondary | ICD-10-CM | POA: Diagnosis not present

## 2021-06-06 DIAGNOSIS — J449 Chronic obstructive pulmonary disease, unspecified: Secondary | ICD-10-CM | POA: Insufficient documentation

## 2021-06-06 DIAGNOSIS — C3432 Malignant neoplasm of lower lobe, left bronchus or lung: Secondary | ICD-10-CM | POA: Insufficient documentation

## 2021-06-06 DIAGNOSIS — C3490 Malignant neoplasm of unspecified part of unspecified bronchus or lung: Secondary | ICD-10-CM

## 2021-06-06 LAB — CBC WITH DIFFERENTIAL (CANCER CENTER ONLY)
Abs Immature Granulocytes: 0.01 10*3/uL (ref 0.00–0.07)
Basophils Absolute: 0 10*3/uL (ref 0.0–0.1)
Basophils Relative: 1 %
Eosinophils Absolute: 0.1 10*3/uL (ref 0.0–0.5)
Eosinophils Relative: 2 %
HCT: 36.4 % (ref 36.0–46.0)
Hemoglobin: 11.9 g/dL — ABNORMAL LOW (ref 12.0–15.0)
Immature Granulocytes: 0 %
Lymphocytes Relative: 47 %
Lymphs Abs: 1.6 10*3/uL (ref 0.7–4.0)
MCH: 30.7 pg (ref 26.0–34.0)
MCHC: 32.7 g/dL (ref 30.0–36.0)
MCV: 93.8 fL (ref 80.0–100.0)
Monocytes Absolute: 0.3 10*3/uL (ref 0.1–1.0)
Monocytes Relative: 10 %
Neutro Abs: 1.4 10*3/uL — ABNORMAL LOW (ref 1.7–7.7)
Neutrophils Relative %: 40 %
Platelet Count: 191 10*3/uL (ref 150–400)
RBC: 3.88 MIL/uL (ref 3.87–5.11)
RDW: 12.2 % (ref 11.5–15.5)
WBC Count: 3.3 10*3/uL — ABNORMAL LOW (ref 4.0–10.5)
nRBC: 0 % (ref 0.0–0.2)

## 2021-06-06 LAB — CMP (CANCER CENTER ONLY)
ALT: 11 U/L (ref 0–44)
AST: 11 U/L — ABNORMAL LOW (ref 15–41)
Albumin: 4 g/dL (ref 3.5–5.0)
Alkaline Phosphatase: 63 U/L (ref 38–126)
Anion gap: 6 (ref 5–15)
BUN: 12 mg/dL (ref 8–23)
CO2: 30 mmol/L (ref 22–32)
Calcium: 9.2 mg/dL (ref 8.9–10.3)
Chloride: 105 mmol/L (ref 98–111)
Creatinine: 1.23 mg/dL — ABNORMAL HIGH (ref 0.44–1.00)
GFR, Estimated: 46 mL/min — ABNORMAL LOW (ref >60)
Glucose, Bld: 99 mg/dL (ref 70–99)
Potassium: 3.7 mmol/L (ref 3.5–5.1)
Sodium: 141 mmol/L (ref 135–145)
Total Bilirubin: 0.5 mg/dL (ref 0.3–1.2)
Total Protein: 6.4 g/dL — ABNORMAL LOW (ref 6.5–8.1)

## 2021-06-12 ENCOUNTER — Other Ambulatory Visit: Payer: Commercial Managed Care - HMO

## 2021-06-27 ENCOUNTER — Ambulatory Visit (INDEPENDENT_AMBULATORY_CARE_PROVIDER_SITE_OTHER): Payer: Self-pay | Admitting: Neurology

## 2021-06-27 ENCOUNTER — Encounter: Payer: Self-pay | Admitting: Neurology

## 2021-06-27 ENCOUNTER — Telehealth: Payer: Self-pay | Admitting: Neurology

## 2021-06-27 DIAGNOSIS — Z91199 Patient's noncompliance with other medical treatment and regimen due to unspecified reason: Secondary | ICD-10-CM

## 2021-06-27 NOTE — Progress Notes (Signed)
Patient no-showing today. Already saw Dr. Rolena Infante at [facility] because she was referred to multiple practices for the same problem (left leg numbness). We would ask [facility] NOT to refer to multiple groups for the same problem due to exactly this reason. Patient states she didn't know she also had an appointment in neurology and has already seen Dr. Rolena Infante for evaluation. I reviewed Dr. Rolena Infante notes from this year as below. Made sure patient knew to follow up with Dr. Rolena Infante: ? ?Dr. Rolena Infante: Treatment plan: 1. Recommend lumbar MRI to evaluate the cauda equina and evaluate the degenerative collapse at L5-S1. We will also be able to determine if there is potential metastatic disease in the lumbar spine.  ?2. Recommend aquatic therapy to help improve her low back pain.  ?3. Continue to follow-up with her pain management provider for medications and pain medical management.  ? ? ?Return to office: After the MRI  ? ? ?

## 2021-06-27 NOTE — Telephone Encounter (Signed)
Patient no-showing today. Already saw Dr. Rolena Infante at [facility] because she was referred to multiple practices for the same problem (left leg numbness). We would ask [facility] NOT to refer to multiple groups for the same problem due to exactly this reason. Patient states she didn't know she also had an appointment in neurology and has already seen Dr. Rolena Infante for evaluation. I reviewed Dr. Rolena Infante notes from this year as below. Made sure patient knew to follow up with Dr. Rolena Infante and not [facility]. If she calls please refer her back to new referrals team. ? ?Dr. Rolena Infante: Treatment plan: 1. Recommend lumbar MRI to evaluate the cauda equina and evaluate the degenerative collapse at L5-S1. We will also be able to determine if there is potential metastatic disease in the lumbar spine.  ?2. Recommend aquatic therapy to help improve her low back pain.  ?3. Continue to follow-up with her pain management provider for medications and pain medical management.  ? ? ?Return to office: After the MRI  ?

## 2021-07-03 ENCOUNTER — Other Ambulatory Visit: Payer: Self-pay | Admitting: Interventional Cardiology

## 2021-07-03 ENCOUNTER — Other Ambulatory Visit: Payer: Self-pay | Admitting: Physician Assistant

## 2021-07-21 ENCOUNTER — Other Ambulatory Visit: Payer: Self-pay | Admitting: Interventional Cardiology

## 2021-08-12 ENCOUNTER — Other Ambulatory Visit: Payer: Self-pay | Admitting: Interventional Cardiology

## 2021-08-27 ENCOUNTER — Ambulatory Visit (INDEPENDENT_AMBULATORY_CARE_PROVIDER_SITE_OTHER): Payer: Medicare Other

## 2021-08-27 DIAGNOSIS — I429 Cardiomyopathy, unspecified: Secondary | ICD-10-CM

## 2021-08-27 DIAGNOSIS — I4901 Ventricular fibrillation: Secondary | ICD-10-CM

## 2021-08-28 LAB — CUP PACEART REMOTE DEVICE CHECK
Battery Remaining Longevity: 66 mo
Battery Remaining Percentage: 67 %
Brady Statistic RV Percent Paced: 0 %
Date Time Interrogation Session: 20230501044200
HighPow Impedance: 79 Ohm
Implantable Lead Implant Date: 20071205
Implantable Lead Location: 753860
Implantable Lead Model: 137
Implantable Lead Serial Number: 102363
Implantable Pulse Generator Implant Date: 20141105
Lead Channel Impedance Value: 448 Ohm
Lead Channel Pacing Threshold Amplitude: 0.7 V
Lead Channel Pacing Threshold Pulse Width: 0.5 ms
Lead Channel Setting Pacing Amplitude: 2.4 V
Lead Channel Setting Pacing Pulse Width: 0.5 ms
Lead Channel Setting Sensing Sensitivity: 0.6 mV
Pulse Gen Serial Number: 126146

## 2021-09-07 ENCOUNTER — Other Ambulatory Visit: Payer: Self-pay | Admitting: Nephrology

## 2021-09-07 DIAGNOSIS — C3490 Malignant neoplasm of unspecified part of unspecified bronchus or lung: Secondary | ICD-10-CM

## 2021-09-07 DIAGNOSIS — I129 Hypertensive chronic kidney disease with stage 1 through stage 4 chronic kidney disease, or unspecified chronic kidney disease: Secondary | ICD-10-CM

## 2021-09-07 DIAGNOSIS — I25119 Atherosclerotic heart disease of native coronary artery with unspecified angina pectoris: Secondary | ICD-10-CM

## 2021-09-07 DIAGNOSIS — E559 Vitamin D deficiency, unspecified: Secondary | ICD-10-CM

## 2021-09-07 DIAGNOSIS — N1831 Chronic kidney disease, stage 3a: Secondary | ICD-10-CM

## 2021-09-11 NOTE — Progress Notes (Signed)
Remote ICD transmission.   

## 2021-09-12 ENCOUNTER — Ambulatory Visit
Admission: RE | Admit: 2021-09-12 | Discharge: 2021-09-12 | Disposition: A | Discharge status: Home / Self Care | Payer: Medicare Other | Source: Ambulatory Visit | Attending: Nephrology | Admitting: Nephrology

## 2021-09-12 DIAGNOSIS — C3490 Malignant neoplasm of unspecified part of unspecified bronchus or lung: Secondary | ICD-10-CM

## 2021-09-12 DIAGNOSIS — N1831 Chronic kidney disease, stage 3a: Secondary | ICD-10-CM

## 2021-09-12 DIAGNOSIS — I25119 Atherosclerotic heart disease of native coronary artery with unspecified angina pectoris: Secondary | ICD-10-CM

## 2021-09-12 DIAGNOSIS — E559 Vitamin D deficiency, unspecified: Secondary | ICD-10-CM

## 2021-09-12 DIAGNOSIS — I129 Hypertensive chronic kidney disease with stage 1 through stage 4 chronic kidney disease, or unspecified chronic kidney disease: Secondary | ICD-10-CM

## 2021-09-13 ENCOUNTER — Ambulatory Visit (INDEPENDENT_AMBULATORY_CARE_PROVIDER_SITE_OTHER): Payer: Medicare Other | Admitting: Emergency Medicine

## 2021-09-13 ENCOUNTER — Encounter: Payer: Self-pay | Admitting: Emergency Medicine

## 2021-09-13 DIAGNOSIS — J449 Chronic obstructive pulmonary disease, unspecified: Secondary | ICD-10-CM

## 2021-09-13 DIAGNOSIS — R053 Chronic cough: Secondary | ICD-10-CM | POA: Diagnosis not present

## 2021-09-13 DIAGNOSIS — C3492 Malignant neoplasm of unspecified part of left bronchus or lung: Secondary | ICD-10-CM | POA: Diagnosis not present

## 2021-09-13 MED ORDER — VALSARTAN 160 MG PO TABS: 160.0000 mg | ORAL_TABLET | Freq: Every day | ORAL | 5 refills | Status: DC

## 2021-09-13 NOTE — Assessment & Plan Note (Signed)
She has had radiation on the left, may have some scarring and distortion of that nodule on her most recent CT.  The right-sided nodules concerning because it is increased in size.  She has not had radiation on the right side.  They are planning for a repeat CT scan of the chest at the 38-month mark and then follow-up with oncology.

## 2021-09-13 NOTE — Patient Instructions (Signed)
Get your repeat CT scan of the chest with oncology and radiation oncology as planned to follow your pulmonary nodules We will continue Spiriva once daily. Keep albuterol available to use 2 puffs if needed for shortness of breath, chest tightness, wheezing. Stop lisinopril. We will start valsartan 160 mg once daily as a substitute for the lisinopril. Continue your Pepcid as you have been taking it Keep the head of your bed elevated, try to avoid acid foods, avoid eating after 8 PM Follow with Dr Lamonte Sakai in 6 months or sooner if you have any problems

## 2021-09-13 NOTE — Progress Notes (Signed)
Subjective:    Patient ID: Karen [name], female    DOB: 1945-10-18, 76 y.o.   MRN: [id number]  HPI  ROV 02/06/21 --follow-up visit 76 year old patient with history of tobacco use, CAD/CABG, hypertension, remote cervical cancer.  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  She has bilateral lower lobe pulmonary nodules that were hypermetabolic on PET scan 9/48/5462.  Her PCP arranged for a left lower lobe nodular TTNA on 01/03/2021 that showed adenocarcinoma.  She has been seen at [facility].  She is planning for SBRT to the left lower lobe pulmonary nodule, with consideration for more work-up or empiric SBRT to the right lower lobe nodule depending on how it evolves on serial imaging. Today she reports she has been having some bilateral upper leg pain for the last few weeks, bothers her when she walks.  She has some exertional fatigue, has to rest after housework. Has some assistance with shopping, etc.  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  She has chronic allergic rhinitis  Pulmonary function testing done 04/06/2021 reviewed by me showed moderate obstruction without a bronchodilator response, restricted lung volumes and decreased diffusion capacity that corrects to the normal range when adjusted for alveolar volume.  Plan in place to continue to follow her pulmonary nodules with serial imaging.  She reports some exertional SOB with housework. She has cough at night, can wake her from sleep. She is on spiriva, uses albuterol about once a day. On loratadine, fluticasone NS, pepcid. Remains on lisinopril 10mg . She has a lot of breakthrough reflux. Formerly on PPI, but stopped to avoid side effects.   CT chest 06/04/2021 reviewed by me showed increase in size in a right lower lobe superior segmental nodule to 1.9 cm, a 1.1 x  0.6 cm posterior medial right lower lobe nodule, 1.7 x 1.9 suprasegmental left lower lobe nodule   Review of Systems As per HPI  Past Medical History:  Diagnosis Date   Acute myocardial infarction, unspecified site, episode of care unspecified    Adenocarcinoma, lung ([place]) 02/16/2021   Allergic rhinitis, cause unspecified    CAD (coronary artery disease)    GERD (gastroesophageal reflux disease)    Headache(784.0)    History of radiation therapy    left lung SBRT 02/08/2021, 02/13/2021, 02/15/2021  Dr Gery Pray   HTN (hypertension)    Other and unspecified hyperlipidemia    Other diseases of lung, not elsewhere classified    Postsurgical aortocoronary bypass status    hx of it.      Family History  Problem Relation Age of Onset   Heart attack Mother    Heart disease Mother    Breast cancer Maternal Aunt      Social History   Socioeconomic History   Marital status: Legally Separated    Spouse name: Not on file   Number of children: Not on file   Years of education: Not on file   Highest education level: Not on file  Occupational History   Not on file  Tobacco Use   Smoking status: Some Days    Packs/day: 0.50    Years: 54.00    Pack years: 27.00    Types: Cigarettes    Start date: 1968   Smokeless tobacco: Never   Tobacco comments:    2  cigarettes smoked daily. 09/13/21 [facility]   Vaping Use   Vaping Use: Never used  Substance and Sexual Activity   Alcohol use: No   Drug use: No   Sexual activity: Not on file  Other Topics Concern   Not on file  Social History Narrative   Married, 4 children.    Social Determinants of Health   Financial Resource Strain: Not on file  Food Insecurity: Not on file  Transportation Needs: Not on file  Physical Activity: Not on file  Stress: Not on file  Social Connections: Not on file  Intimate Partner Violence: Not on file    No hx TB exposure Has not lived in the [place] or [place] in [place] and [place]   Allergies   Allergen Reactions   Codeine Itching and Swelling   Percocet [Oxycodone-Acetaminophen] Hives and Itching     Outpatient Medications Prior to Visit  Medication Sig Dispense Refill   acetaminophen (TYLENOL) 325 MG tablet Take 2 tablets (650 mg total) by mouth every 6 (six) hours as needed. 60 tablet 0   albuterol (VENTOLIN HFA) 108 (90 Base) MCG/ACT inhaler Inhale 2 puffs into the lungs every 6 (six) hours as needed for wheezing or shortness of breath. 54 g 3   amLODipine (NORVASC) 10 MG tablet TAKE 1 TABLET BY MOUTH DAILY 90 tablet 2   atorvastatin (LIPITOR) 10 MG tablet TAKE 1 TABLET BY MOUTH DAILY 90 tablet 0   clopidogrel (PLAVIX) 75 MG tablet TAKE 1 TABLET BY MOUTH DAILY 90 tablet 0   cyclobenzaprine (FLEXERIL) 10 MG tablet Take 10 mg by mouth 3 (three) times daily as needed for muscle spasms.     famotidine (PEPCID) 20 MG tablet Take 20 mg by mouth at bedtime.     fluticasone (FLONASE) 50 MCG/ACT nasal spray Place 1 spray into both nostrils daily. 18.2 mL 2   furosemide (LASIX) 20 MG tablet TAKE 1 TABLET BY MOUTH DAILY 90 tablet 2   guaiFENesin (MUCINEX) 600 MG 12 hr tablet Take 1 tablet (600 mg total) by mouth 2 (two) times daily. 60 tablet 3   isosorbide mononitrate (IMDUR) 60 MG 24 hr tablet Take 1 tablet (60 mg total) by mouth 2 (two) times daily. Please make yearly appt with Dr. Irish Lack for May 2023 for future refills. Thank you 1st attempt 180 tablet 0   lisinopril (ZESTRIL) 10 MG tablet Take 1 tablet (10 mg total) by mouth daily. 90 tablet 2   loratadine (CLARITIN) 10 MG tablet Take 1 tablet (10 mg total) by mouth daily. 30 tablet 11   metoprolol tartrate (LOPRESSOR) 25 MG tablet TAKE 1 TABLET BY MOUTH TWICE DAILY 180 tablet 3   nitroGLYCERIN (NITROSTAT) 0.4 MG SL tablet Place 1 tablet (0.4 mg total) under the tongue every 5 (five) minutes as needed for chest pain (X3 DOSES MAX). 25 tablet 11   oxyCODONE-acetaminophen (PERCOCET) 7.5-325 MG tablet Take 1-2 tablets by mouth every 4  (four) hours as needed for moderate pain (back pain).     pregabalin (LYRICA) 50 MG capsule Take 50 mg by mouth at bedtime as needed (leg pain).     ranolazine (RANEXA) 500 MG 12 hr tablet TAKE 1 TABLET BY MOUTH TWICE DAILY 180 tablet 3   Tiotropium Bromide Monohydrate (SPIRIVA RESPIMAT) 1.25 MCG/ACT AERS Inhale 2 puffs into the lungs daily. 4 g 0   No facility-administered medications prior to visit.          Objective:   Physical Exam  Vitals:  09/13/21 1452  BP: 120/74  Pulse: 71  Temp: 98.2 F (36.8 C)  TempSrc: Oral  SpO2: 100%  Weight: 154 lb 3.2 oz (69.9 kg)  Height: 5\' 3"  (1.6 m)   Gen: Pleasant, well-nourished, in no distress,  normal affect  ENT: No lesions,  mouth clear, dentures, oropharynx clear, no postnasal drip  Neck: No JVD, no stridor  Lungs: No use of accessory muscles, no crackles or wheezing on normal respiration, no wheeze  Cardiovascular: RRR, heart sounds normal, no murmur or gallops, no peripheral edema  Musculoskeletal: No deformities, no cyanosis or clubbing  Neuro: alert, awake, non focal  Skin: Warm, no lesions or rash     Assessment & Plan:  Adenocarcinoma, lung ([place]) She has had radiation on the left, may have some scarring and distortion of that nodule on her most recent CT.  The right-sided nodules concerning because it is increased in size.  She has not had radiation on the right side.  They are planning for a repeat CT scan of the chest at the 52-month mark and then follow-up with oncology.  Stage 2 moderate COPD by GOLD classification ([place]) We will continue Spiriva once daily. Keep albuterol available to use 2 puffs if needed for shortness of breath, chest tightness, wheezing. Follow with Dr Lamonte Sakai in 6 months or sooner if you have any problems  Chronic cough Significant contribution from her GERD which is not well controlled on her Pepcid.  She used to be on a PPI but this was stopped to avoid side effects.  We talked about a  GERD diet, keeping the head of her bed elevated, avoiding meals after 8 PM.  I will try changing her lisinopril to valsartan  Baltazar Apo, MD, PhD 09/13/2021, 3:08 PM [facility] [phone] or if no answer before 7:00PM call [phone] For any issues after 7:00PM please call eLink [phone]

## 2021-09-13 NOTE — Assessment & Plan Note (Signed)
We will continue Spiriva once daily. Keep albuterol available to use 2 puffs if needed for shortness of breath, chest tightness, wheezing. Follow with Dr Lamonte Sakai in 6 months or sooner if you have any problems

## 2021-09-13 NOTE — Assessment & Plan Note (Signed)
Significant contribution from her GERD which is not well controlled on her Pepcid.  She used to be on a PPI but this was stopped to avoid side effects.  We talked about a GERD diet, keeping the head of her bed elevated, avoiding meals after 8 PM.  I will try changing her lisinopril to valsartan

## 2021-09-13 NOTE — Addendum Note (Signed)
Addended by: Gavin Potters R on: 09/13/2021 03:32 PM   Modules accepted: Orders

## 2021-09-28 ENCOUNTER — Other Ambulatory Visit: Payer: Self-pay | Admitting: Interventional Cardiology

## 2021-09-29 ENCOUNTER — Other Ambulatory Visit: Payer: Self-pay | Admitting: Interventional Cardiology

## 2021-10-22 ENCOUNTER — Encounter: Payer: Self-pay | Admitting: *Deleted

## 2021-10-22 NOTE — Progress Notes (Signed)
Oncology Nurse Navigator Documentation     10/22/2021   10:00 AM 01/11/2021    4:00 PM  Oncology Nurse Navigator Flowsheets  Abnormal Finding Date  09/14/2020  Confirmed Diagnosis Date  01/03/2021  Diagnosis Status  Confirmed Diagnosis Complete  Planned Course of Treatment  Radiation  Phase of Treatment Radiation Radiation  Radiation Actual Start Date: 02/08/2021   Radiation Actual End Date: 02/15/2021   Navigator Follow Up Date:  01/16/2021  Navigator Follow Up Reason:  Appointment Review  Navigation Complete Date: 10/22/2021   Post Navigation: Continue to Follow Patient? No   Reason Not Navigating Patient: No Treatment, Observation Only   Navigator Location [facility]  Navigator Encounter Type  Clinic/MDC;Initial MedOnc;Initial RadOnc  Multidisiplinary Clinic Date  01/11/2021  Multidisiplinary Clinic Type  Thoracic  Patient Visit Type  Initial;MedOnc;RadOnc  Treatment Phase Post-Tx Follow-up Pre-Tx/Tx Discussion  Barriers/Navigation Needs Coordination of Care Education  Education  Newly Diagnosed Cancer Education;Other  Interventions Coordination of Care Psycho-Social Support;Education  Acuity Level 2-Minimal Needs (1-2 Barriers Identified) Level 2-Minimal Needs (1-2 Barriers Identified)  Coordination of Care Other   Education Method  Written;Verbal  Support Groups/Services  Other  Time Spent with Patient 15 45

## 2021-10-26 ENCOUNTER — Other Ambulatory Visit: Payer: Self-pay | Admitting: Interventional Cardiology

## 2021-10-29 ENCOUNTER — Other Ambulatory Visit: Payer: Self-pay

## 2021-10-29 MED ORDER — ISOSORBIDE MONONITRATE ER 60 MG PO TB24: 60.0000 mg | ORAL_TABLET | Freq: Two times a day (BID) | ORAL | 0 refills | Status: DC

## 2021-11-06 ENCOUNTER — Other Ambulatory Visit: Payer: Self-pay | Admitting: Interventional Cardiology

## 2021-11-25 ENCOUNTER — Other Ambulatory Visit: Payer: Self-pay | Admitting: Interventional Cardiology

## 2021-11-26 ENCOUNTER — Ambulatory Visit (INDEPENDENT_AMBULATORY_CARE_PROVIDER_SITE_OTHER): Payer: Medicare Other

## 2021-11-26 DIAGNOSIS — I429 Cardiomyopathy, unspecified: Secondary | ICD-10-CM | POA: Diagnosis not present

## 2021-11-26 DIAGNOSIS — I4901 Ventricular fibrillation: Secondary | ICD-10-CM

## 2021-11-26 LAB — CUP PACEART REMOTE DEVICE CHECK
Battery Remaining Longevity: 60 mo
Battery Remaining Percentage: 63 %
Brady Statistic RV Percent Paced: 0 %
Date Time Interrogation Session: 20230731044100
HighPow Impedance: 72 Ohm
Implantable Lead Implant Date: 20071205
Implantable Lead Location: 753860
Implantable Lead Model: 137
Implantable Lead Serial Number: 102363
Implantable Pulse Generator Implant Date: 20141105
Lead Channel Impedance Value: 476 Ohm
Lead Channel Pacing Threshold Amplitude: 0.7 V
Lead Channel Pacing Threshold Pulse Width: 0.5 ms
Lead Channel Setting Pacing Amplitude: 2.4 V
Lead Channel Setting Pacing Pulse Width: 0.5 ms
Lead Channel Setting Sensing Sensitivity: 0.6 mV
Pulse Gen Serial Number: 126146

## 2021-11-26 NOTE — Telephone Encounter (Signed)
ranolazine (RANEXA) 500 MG 12 hr tablet Medication Date: 04/26/2021 Department: [facility] [place] Office Ordering/Authorizing: Jettie Booze, MD   Order Providers  Prescribing Provider Encounter Provider  Jettie Booze, MD Jettie Booze, MD   Outpatient Medication Detail   Disp Refills Start End   ranolazine (RANEXA) 500 MG 12 hr tablet 180 tablet 3 04/26/2021    Sig: TAKE 1 TABLET BY MOUTH TWICE DAILY   Sent to pharmacy as: ranolazine (RANEXA) 500 MG 12 hr tablet   E-Prescribing Status: Receipt confirmed by pharmacy (04/26/2021 11:05 AM EST)    Pharmacy  [facility] #[id number] - [place], [place] - [place] Encounter  Priority and Order Details

## 2021-11-27 ENCOUNTER — Inpatient Hospital Stay: Payer: Medicare Other | Attending: Internal Medicine

## 2021-11-27 DIAGNOSIS — Z79899 Other long term (current) drug therapy: Secondary | ICD-10-CM | POA: Insufficient documentation

## 2021-11-27 DIAGNOSIS — C3432 Malignant neoplasm of lower lobe, left bronchus or lung: Secondary | ICD-10-CM | POA: Insufficient documentation

## 2021-11-29 ENCOUNTER — Inpatient Hospital Stay: Payer: Medicare Other

## 2021-11-29 ENCOUNTER — Inpatient Hospital Stay (HOSPITAL_BASED_OUTPATIENT_CLINIC_OR_DEPARTMENT_OTHER): Payer: Medicare Other | Admitting: Internal Medicine

## 2021-11-29 ENCOUNTER — Other Ambulatory Visit: Payer: Self-pay

## 2021-11-29 VITALS — BP 110/63 | HR 73 | Temp 98.4°F | Resp 15 | Wt 153.6 lb

## 2021-11-29 DIAGNOSIS — C3490 Malignant neoplasm of unspecified part of unspecified bronchus or lung: Secondary | ICD-10-CM

## 2021-11-29 DIAGNOSIS — Z79899 Other long term (current) drug therapy: Secondary | ICD-10-CM | POA: Diagnosis not present

## 2021-11-29 DIAGNOSIS — C3432 Malignant neoplasm of lower lobe, left bronchus or lung: Secondary | ICD-10-CM | POA: Diagnosis not present

## 2021-11-29 LAB — CBC WITH DIFFERENTIAL (CANCER CENTER ONLY)
Abs Immature Granulocytes: 0.01 10*3/uL (ref 0.00–0.07)
Basophils Absolute: 0 10*3/uL (ref 0.0–0.1)
Basophils Relative: 0 %
Eosinophils Absolute: 0.1 10*3/uL (ref 0.0–0.5)
Eosinophils Relative: 1 %
HCT: 37.3 % (ref 36.0–46.0)
Hemoglobin: 12.7 g/dL (ref 12.0–15.0)
Immature Granulocytes: 0 %
Lymphocytes Relative: 40 %
Lymphs Abs: 1.7 10*3/uL (ref 0.7–4.0)
MCH: 32.1 pg (ref 26.0–34.0)
MCHC: 34 g/dL (ref 30.0–36.0)
MCV: 94.2 fL (ref 80.0–100.0)
Monocytes Absolute: 0.3 10*3/uL (ref 0.1–1.0)
Monocytes Relative: 7 %
Neutro Abs: 2.1 10*3/uL (ref 1.7–7.7)
Neutrophils Relative %: 52 %
Platelet Count: 198 10*3/uL (ref 150–400)
RBC: 3.96 MIL/uL (ref 3.87–5.11)
RDW: 12 % (ref 11.5–15.5)
WBC Count: 4.2 10*3/uL (ref 4.0–10.5)
nRBC: 0 % (ref 0.0–0.2)

## 2021-11-29 LAB — CMP (CANCER CENTER ONLY)
ALT: 8 U/L (ref 0–44)
AST: 11 U/L — ABNORMAL LOW (ref 15–41)
Albumin: 4.3 g/dL (ref 3.5–5.0)
Alkaline Phosphatase: 72 U/L (ref 38–126)
Anion gap: 5 (ref 5–15)
BUN: 17 mg/dL (ref 8–23)
CO2: 30 mmol/L (ref 22–32)
Calcium: 9.4 mg/dL (ref 8.9–10.3)
Chloride: 104 mmol/L (ref 98–111)
Creatinine: 1.29 mg/dL — ABNORMAL HIGH (ref 0.44–1.00)
GFR, Estimated: 43 mL/min — ABNORMAL LOW (ref >60)
Glucose, Bld: 107 mg/dL — ABNORMAL HIGH (ref 70–99)
Potassium: 4.1 mmol/L (ref 3.5–5.1)
Sodium: 139 mmol/L (ref 135–145)
Total Bilirubin: 0.5 mg/dL (ref 0.3–1.2)
Total Protein: 7.1 g/dL (ref 6.5–8.1)

## 2021-11-29 NOTE — Progress Notes (Signed)
[place] Telephone:([phone]) [phone]   Fax:(336) [place], [place] [address]  DIAGNOSIS: Stage IB (T1b, N0, M0) non-small cell lung cancer, adenocarcinoma presented with left lower lobe lung nodule diagnosed in September 2022.   PRIOR THERAPY: SBRT under the care of Dr. Sondra Come. Last treatment on 02/15/21.    CURRENT THERAPY: Observation   INTERVAL HISTORY: Karen [name] 76 y.o. female returns to the clinic today for a lab visit.  The patient is feeling fine today with no concerning complaints.  She denied having any current chest pain, shortness of breath, cough or hemoptysis.  She has no nausea, vomiting, diarrhea or constipation.  She has no headache or visual changes.  She has no weight loss or night sweats.  She continues to have arthralgia.  She was supposed to have CT scan of the chest before her visit today but unfortunately the scan is scheduled to be done next week.  MEDICAL HISTORY: Past Medical History:  Diagnosis Date   Acute myocardial infarction, unspecified site, episode of care unspecified    Adenocarcinoma, lung ([place]) 02/16/2021   Allergic rhinitis, cause unspecified    CAD (coronary artery disease)    GERD (gastroesophageal reflux disease)    Headache(784.0)    History of radiation therapy    left lung SBRT 02/08/2021, 02/13/2021, 02/15/2021  Dr Gery Pray   HTN (hypertension)    Other and unspecified hyperlipidemia    Other diseases of lung, not elsewhere classified    Postsurgical aortocoronary bypass status    hx of it.     ALLERGIES:  is allergic to codeine and percocet [oxycodone-acetaminophen].  MEDICATIONS:  Current Outpatient Medications  Medication Sig Dispense Refill   acetaminophen (TYLENOL) 325 MG tablet Take 2 tablets (650 mg total) by mouth every 6 (six) hours as needed. 60 tablet 0   albuterol (VENTOLIN HFA) 108 (90 Base) MCG/ACT inhaler Inhale 2  puffs into the lungs every 6 (six) hours as needed for wheezing or shortness of breath. 54 g 3   amLODipine (NORVASC) 10 MG tablet TAKE 1 TABLET BY MOUTH DAILY 90 tablet 2   atorvastatin (LIPITOR) 10 MG tablet TAKE 1 TABLET BY MOUTH DAILY 15 tablet 0   clopidogrel (PLAVIX) 75 MG tablet TAKE 1 TABLET BY MOUTH DAILY 15 tablet 0   cyclobenzaprine (FLEXERIL) 10 MG tablet Take 10 mg by mouth 3 (three) times daily as needed for muscle spasms.     famotidine (PEPCID) 20 MG tablet Take 20 mg by mouth at bedtime.     fluticasone (FLONASE) 50 MCG/ACT nasal spray Place 1 spray into both nostrils daily. 18.2 mL 2   furosemide (LASIX) 20 MG tablet TAKE 1 TABLET BY MOUTH DAILY 90 tablet 2   guaiFENesin (MUCINEX) 600 MG 12 hr tablet Take 1 tablet (600 mg total) by mouth 2 (two) times daily. 60 tablet 3   isosorbide mononitrate (IMDUR) 60 MG 24 hr tablet Take 1 tablet (60 mg total) by mouth 2 (two) times daily. 30 tablet 0   loratadine (CLARITIN) 10 MG tablet Take 1 tablet (10 mg total) by mouth daily. 30 tablet 11   metoprolol tartrate (LOPRESSOR) 25 MG tablet TAKE 1 TABLET BY MOUTH TWICE DAILY 180 tablet 3   nitroGLYCERIN (NITROSTAT) 0.4 MG SL tablet Place 1 tablet (0.4 mg total) under the tongue every 5 (five) minutes as needed for chest pain (X3 DOSES MAX). 25 tablet 11  oxyCODONE-acetaminophen (PERCOCET) 7.5-325 MG tablet Take 1-2 tablets by mouth every 4 (four) hours as needed for moderate pain (back pain).     pregabalin (LYRICA) 50 MG capsule Take 50 mg by mouth at bedtime as needed (leg pain).     ranolazine (RANEXA) 500 MG 12 hr tablet TAKE 1 TABLET BY MOUTH TWICE DAILY 180 tablet 3   Tiotropium Bromide Monohydrate (SPIRIVA RESPIMAT) 1.25 MCG/ACT AERS Inhale 2 puffs into the lungs daily. 4 g 0   valsartan (DIOVAN) 160 MG tablet Take 1 tablet (160 mg total) by mouth daily. 30 tablet 5   No current facility-administered medications for this visit.    SURGICAL HISTORY:  Past Surgical History:   Procedure Laterality Date   CARDIAC DEFIBRILLATOR PLACEMENT  03/2006   [employer]. remote-yes.    CORONARY ARTERY BYPASS GRAFT     x4   IMPLANTABLE CARDIOVERTER DEFIBRILLATOR GENERATOR CHANGE N/A 03/03/2013   Procedure: IMPLANTABLE CARDIOVERTER DEFIBRILLATOR GENERATOR CHANGE;  Surgeon: Deboraha Sprang, MD;  Location: [facility] CATH LAB;  Service: Cardiovascular;  Laterality: N/A;   stent implant     x1. possibly x2.     REVIEW OF SYSTEMS:  A comprehensive review of systems was negative except for: Musculoskeletal: positive for arthralgias   PHYSICAL EXAMINATION: General appearance: alert, cooperative, and no distress Head: Normocephalic, without obvious abnormality, atraumatic Neck: no adenopathy, no JVD, supple, symmetrical, trachea midline, and thyroid not enlarged, symmetric, no tenderness/mass/nodules Lymph nodes: Cervical, supraclavicular, and axillary nodes normal. Resp: clear to auscultation bilaterally Back: symmetric, no curvature. ROM normal. No CVA tenderness. Cardio: regular rate and rhythm, S1, S2 normal, no murmur, click, rub or gallop GI: soft, non-tender; bowel sounds normal; no masses,  no organomegaly Extremities: extremities normal, atraumatic, no cyanosis or edema  ECOG PERFORMANCE STATUS: 1 - Symptomatic but completely ambulatory  Blood pressure 110/63, pulse 73, temperature 98.4 F (36.9 C), temperature source Oral, resp. rate 15, weight 153 lb 9.6 oz (69.7 kg), SpO2 100 %.  LABORATORY DATA: Lab Results  Component Value Date   WBC 3.3 (L) 06/06/2021   HGB 11.9 (L) 06/06/2021   HCT 36.4 06/06/2021   MCV 93.8 06/06/2021   PLT 191 06/06/2021      Chemistry      Component Value Date/Time   NA 141 06/06/2021 1114   NA 141 03/06/2021 0858   K 3.7 06/06/2021 1114   CL 105 06/06/2021 1114   CO2 30 06/06/2021 1114   BUN 12 06/06/2021 1114   BUN 15 03/06/2021 0858   CREATININE 1.23 (H) 06/06/2021 1114      Component Value Date/Time   CALCIUM 9.2 06/06/2021 1114    ALKPHOS 63 06/06/2021 1114   AST 11 (L) 06/06/2021 1114   ALT 11 06/06/2021 1114   BILITOT 0.5 06/06/2021 1114       RADIOGRAPHIC STUDIES: CUP PACEART REMOTE DEVICE CHECK  Result Date: 11/26/2021 Scheduled remote reviewed. Normal device function.  Next remote 91 days. [place]   ASSESSMENT AND PLAN: This is a very pleasant 76 years old African-American female with Stage IB (T1b, N0, M0) non-small cell lung cancer, adenocarcinoma presented with left lower lobe lung nodule diagnosed in September 2022.  She is status post SBRT under the care of Dr. Sondra Come. Last treatment on 02/15/21.  The patient is currently on observation and she is feeling fine with no concerning complaints except for the arthralgia. The patient was supposed to have repeat CT scan of the chest before this visit but unfortunately it is scheduled to  be done on 12/05/2021. I recommended for the patient to have her blood work done today and scan next week as planned. If the scan showed no concerning findings for progression, I will see her back for follow-up visit in 6 months with repeat CT scan of the chest again. The patient was advised to call immediately if she has any other concerning symptoms in the interval. The patient voices understanding of current disease status and treatment options and is in agreement with the current care plan.  All questions were answered. The patient knows to call the clinic with any problems, questions or concerns. We can certainly see the patient much sooner if necessary.   Disclaimer: This note was dictated with voice recognition software. Similar sounding words can inadvertently be transcribed and may not be corrected upon review.

## 2021-12-05 ENCOUNTER — Ambulatory Visit (HOSPITAL_COMMUNITY)
Admission: RE | Admit: 2021-12-05 | Discharge: 2021-12-05 | Disposition: A | Discharge status: Home / Self Care | Payer: Medicare Other | Source: Ambulatory Visit | Attending: Physician Assistant | Admitting: Physician Assistant

## 2021-12-05 ENCOUNTER — Other Ambulatory Visit: Payer: Self-pay | Admitting: Interventional Cardiology

## 2021-12-05 DIAGNOSIS — C3492 Malignant neoplasm of unspecified part of left bronchus or lung: Secondary | ICD-10-CM | POA: Diagnosis present

## 2021-12-05 MED ORDER — IOHEXOL 300 MG/ML  SOLN
80.0000 mL | Freq: Once | INTRAMUSCULAR | Status: AC | PRN
  Administered 2021-12-05: 80 mL via INTRAVENOUS

## 2021-12-06 ENCOUNTER — Other Ambulatory Visit: Payer: Self-pay | Admitting: Interventional Cardiology

## 2021-12-07 ENCOUNTER — Telehealth: Payer: Self-pay | Admitting: Internal Medicine

## 2021-12-07 NOTE — Telephone Encounter (Signed)
Scheduled per 8/11 in basket, pt has been called and confirmed

## 2021-12-09 ENCOUNTER — Emergency Department (HOSPITAL_BASED_OUTPATIENT_CLINIC_OR_DEPARTMENT_OTHER): Payer: Medicare Other

## 2021-12-09 ENCOUNTER — Other Ambulatory Visit: Payer: Self-pay

## 2021-12-09 ENCOUNTER — Emergency Department (HOSPITAL_COMMUNITY): Payer: Medicare Other

## 2021-12-09 ENCOUNTER — Emergency Department (HOSPITAL_COMMUNITY)
Admission: EM | Admit: 2021-12-09 | Discharge: 2021-12-09 | Disposition: A | Discharge status: Home / Self Care | Payer: Medicare Other | Attending: Emergency Medicine | Admitting: Emergency Medicine

## 2021-12-09 DIAGNOSIS — Z7951 Long term (current) use of inhaled steroids: Secondary | ICD-10-CM | POA: Insufficient documentation

## 2021-12-09 DIAGNOSIS — M79605 Pain in left leg: Secondary | ICD-10-CM | POA: Diagnosis not present

## 2021-12-09 DIAGNOSIS — R110 Nausea: Secondary | ICD-10-CM | POA: Insufficient documentation

## 2021-12-09 DIAGNOSIS — Z85118 Personal history of other malignant neoplasm of bronchus and lung: Secondary | ICD-10-CM | POA: Diagnosis not present

## 2021-12-09 DIAGNOSIS — Z95810 Presence of automatic (implantable) cardiac defibrillator: Secondary | ICD-10-CM | POA: Diagnosis not present

## 2021-12-09 DIAGNOSIS — R911 Solitary pulmonary nodule: Secondary | ICD-10-CM | POA: Diagnosis not present

## 2021-12-09 DIAGNOSIS — R52 Pain, unspecified: Secondary | ICD-10-CM | POA: Diagnosis not present

## 2021-12-09 DIAGNOSIS — R42 Dizziness and giddiness: Secondary | ICD-10-CM | POA: Insufficient documentation

## 2021-12-09 DIAGNOSIS — R55 Syncope and collapse: Secondary | ICD-10-CM

## 2021-12-09 DIAGNOSIS — J449 Chronic obstructive pulmonary disease, unspecified: Secondary | ICD-10-CM | POA: Diagnosis not present

## 2021-12-09 DIAGNOSIS — Z7902 Long term (current) use of antithrombotics/antiplatelets: Secondary | ICD-10-CM | POA: Diagnosis not present

## 2021-12-09 DIAGNOSIS — Z79899 Other long term (current) drug therapy: Secondary | ICD-10-CM | POA: Insufficient documentation

## 2021-12-09 DIAGNOSIS — I10 Essential (primary) hypertension: Secondary | ICD-10-CM | POA: Diagnosis not present

## 2021-12-09 LAB — URINALYSIS, ROUTINE W REFLEX MICROSCOPIC
Bacteria, UA: NONE SEEN
Bilirubin Urine: NEGATIVE
Glucose, UA: NEGATIVE mg/dL
Hgb urine dipstick: NEGATIVE
Ketones, ur: NEGATIVE mg/dL
Leukocytes,Ua: NEGATIVE
Nitrite: NEGATIVE
Protein, ur: NEGATIVE mg/dL
Specific Gravity, Urine: 1.01 (ref 1.005–1.030)
pH: 6 (ref 5.0–8.0)

## 2021-12-09 LAB — BASIC METABOLIC PANEL
Anion gap: 8 (ref 5–15)
BUN: 14 mg/dL (ref 8–23)
CO2: 24 mmol/L (ref 22–32)
Calcium: 9 mg/dL (ref 8.9–10.3)
Chloride: 108 mmol/L (ref 98–111)
Creatinine, Ser: 1.38 mg/dL — ABNORMAL HIGH (ref 0.44–1.00)
GFR, Estimated: 40 mL/min — ABNORMAL LOW (ref >60)
Glucose, Bld: 124 mg/dL — ABNORMAL HIGH (ref 70–99)
Potassium: 4 mmol/L (ref 3.5–5.1)
Sodium: 140 mmol/L (ref 135–145)

## 2021-12-09 LAB — CBC WITH DIFFERENTIAL/PLATELET
Abs Immature Granulocytes: 0.01 10*3/uL (ref 0.00–0.07)
Basophils Absolute: 0 10*3/uL (ref 0.0–0.1)
Basophils Relative: 0 %
Eosinophils Absolute: 0 10*3/uL (ref 0.0–0.5)
Eosinophils Relative: 1 %
HCT: 35.9 % — ABNORMAL LOW (ref 36.0–46.0)
Hemoglobin: 11.9 g/dL — ABNORMAL LOW (ref 12.0–15.0)
Immature Granulocytes: 0 %
Lymphocytes Relative: 25 %
Lymphs Abs: 1.4 10*3/uL (ref 0.7–4.0)
MCH: 31.6 pg (ref 26.0–34.0)
MCHC: 33.1 g/dL (ref 30.0–36.0)
MCV: 95.5 fL (ref 80.0–100.0)
Monocytes Absolute: 0.4 10*3/uL (ref 0.1–1.0)
Monocytes Relative: 7 %
Neutro Abs: 3.9 10*3/uL (ref 1.7–7.7)
Neutrophils Relative %: 67 %
Platelets: 183 10*3/uL (ref 150–400)
RBC: 3.76 MIL/uL — ABNORMAL LOW (ref 3.87–5.11)
RDW: 12 % (ref 11.5–15.5)
WBC: 5.8 10*3/uL (ref 4.0–10.5)
nRBC: 0 % (ref 0.0–0.2)

## 2021-12-09 LAB — CBG MONITORING, ED: Glucose-Capillary: 116 mg/dL — ABNORMAL HIGH (ref 70–99)

## 2021-12-09 LAB — TROPONIN I (HIGH SENSITIVITY)
Troponin I (High Sensitivity): 3 ng/L (ref <18)
Troponin I (High Sensitivity): 3 ng/L (ref <18)

## 2021-12-09 LAB — CK: Total CK: 113 U/L (ref 38–234)

## 2021-12-09 MED ORDER — IOHEXOL 350 MG/ML SOLN
80.0000 mL | Freq: Once | INTRAVENOUS | Status: AC | PRN
  Administered 2021-12-09: 70 mL via INTRAVENOUS

## 2021-12-09 MED ORDER — SODIUM CHLORIDE 0.9 % IV BOLUS
1000.0000 mL | Freq: Once | INTRAVENOUS | Status: AC
  Administered 2021-12-09: 1000 mL via INTRAVENOUS

## 2021-12-09 NOTE — Discharge Instructions (Addendum)
Rest, drink plenty of fluids  Make sure to follow-up with your primary care doctor  Return for new or worsening symptoms

## 2021-12-09 NOTE — ED Notes (Signed)
Pt currently in vascular

## 2021-12-09 NOTE — ED Notes (Signed)
[employer] pacemaker interrogation complete.

## 2021-12-09 NOTE — ED Provider Notes (Cosign Needed Addendum)
[place] EMERGENCY DEPARTMENT Provider Note   CSN: [id number] Arrival date & time: 12/09/21  1736     History  Chief Complaint  Patient presents with   Hypotension    Karen [name] is a 76 y.o. female history of hypertension, COPD, known lung cancer following with oncology here for evaluation near syncope.  Was at a barbecue with family all day, outside.  Had not eaten today.  Felt lightheaded, dizzy and like she is going to pass out.  Developed a mild posterior headache with EMS however does get chronic headaches states this feels similar.  No sudden onset thunderclap headache.  No new unilateral numbness, weakness, difficulty with word finding.  Had some nausea without vomiting.  Has noted since Thursday she has had pain to her left leg.  Occasionally has some swelling, none today.  Worsened standing on her feet for long periods of time.  No back pain.  No hemoptysis, chest pain, shortness of breath.  Per EMS on arrival patient found to be hypotensive in systolic 63K.  Given IV fluids with improvement.    Has not felt ICD fired       Home Medications Prior to Admission medications   Medication Sig Start Date End Date Taking? Authorizing Provider  acetaminophen (TYLENOL) 500 MG tablet Take 500 mg by mouth every 6 (six) hours as needed for headache.   Yes [provider]  albuterol (VENTOLIN HFA) 108 (90 Base) MCG/ACT inhaler Inhale 2 puffs into the lungs every 6 (six) hours as needed for wheezing or shortness of breath. 05/21/21  Yes Cobb, Karie Schwalbe, NP  amLODipine (NORVASC) 10 MG tablet TAKE 1 TABLET BY MOUTH DAILY 07/04/21  Yes Jettie Booze, MD  atorvastatin (LIPITOR) 10 MG tablet TAKE 1 TABLET BY MOUTH DAILY 12/06/21  Yes Jettie Booze, MD  clopidogrel (PLAVIX) 75 MG tablet TAKE 1 TABLET BY MOUTH DAILY 12/06/21  Yes Jettie Booze, MD  cyclobenzaprine (FLEXERIL) 10 MG tablet Take 10 mg by mouth 3 (three) times daily as needed  for muscle spasms. 01/16/21  Yes [provider]  famotidine (PEPCID) 40 MG tablet Take 40 mg by mouth at bedtime. 08/31/21  Yes [provider]  fluticasone (FLONASE) 50 MCG/ACT nasal spray Place 1 spray into both nostrils daily. Patient taking differently: Place 1 spray into both nostrils daily as needed for allergies. 04/06/21  Yes Cobb, Karie Schwalbe, NP  furosemide (LASIX) 20 MG tablet TAKE 1 TABLET BY MOUTH DAILY 07/23/21  Yes Jettie Booze, MD  isosorbide mononitrate (IMDUR) 60 MG 24 hr tablet Take 1 tablet (60 mg total) by mouth 2 (two) times daily. 10/29/21  Yes Jettie Booze, MD  lisinopril (ZESTRIL) 10 MG tablet Take 10 mg by mouth daily. 11/07/21  Yes [provider]  loratadine (CLARITIN) 10 MG tablet Take 1 tablet (10 mg total) by mouth daily. Patient taking differently: Take 10 mg by mouth daily as needed for allergies. 04/06/21  Yes Cobb, Karie Schwalbe, NP  metoprolol tartrate (LOPRESSOR) 25 MG tablet TAKE 1 TABLET BY MOUTH TWICE DAILY 05/29/21  Yes Jettie Booze, MD  nitroGLYCERIN (NITROSTAT) 0.4 MG SL tablet Place 1 tablet (0.4 mg total) under the tongue every 5 (five) minutes as needed for chest pain (X3 DOSES MAX). 02/16/21  Yes Bhagat, Bhavinkumar, PA  oxyCODONE-acetaminophen (PERCOCET) 7.5-325 MG tablet Take 1-2 tablets by mouth every 4 (four) hours as needed for moderate pain (back pain).   Yes [provider]  pregabalin (LYRICA) 50 MG capsule Take 50 mg by mouth at bedtime as needed (leg pain). 01/16/21  Yes [provider]  ranolazine (RANEXA) 500 MG 12 hr tablet TAKE 1 TABLET BY MOUTH TWICE DAILY 04/26/21  Yes Jettie Booze, MD  Tiotropium Bromide Monohydrate (SPIRIVA RESPIMAT) 1.25 MCG/ACT AERS Inhale 2 puffs into the lungs daily. Patient taking differently: Inhale 2 puffs into the lungs daily as needed (sob). 02/06/21  Yes Collene Gobble, MD  acetaminophen (TYLENOL) 325 MG tablet Take 2 tablets (650 mg total) by  mouth every 6 (six) hours as needed. Patient not taking: Reported on 12/09/2021 11/06/16   Varney Biles, MD  guaiFENesin (MUCINEX) 600 MG 12 hr tablet Take 1 tablet (600 mg total) by mouth 2 (two) times daily. Patient not taking: Reported on 12/09/2021 04/06/21   Clayton Bibles, NP  valsartan (DIOVAN) 160 MG tablet Take 1 tablet (160 mg total) by mouth daily. Patient not taking: Reported on 12/09/2021 09/13/21   Collene Gobble, MD      Allergies    Codeine and Percocet [oxycodone-acetaminophen]    Review of Systems   Review of Systems  Constitutional: Negative.   HENT: Negative.    Respiratory: Negative.    Cardiovascular: Negative.   Gastrointestinal: Negative.   Genitourinary: Negative.   Musculoskeletal:  Negative for back pain and gait problem (walks with a cane).       Left leg pain  Skin: Negative.   Neurological:  Positive for syncope (near syncope).  All other systems reviewed and are negative.   Physical Exam Updated Vital Signs BP (!) 109/56   Pulse 79   Temp 98.2 F (36.8 C) (Oral)   Resp 18   SpO2 100%  Physical Exam Vitals and nursing note reviewed.  Constitutional:      General: She is not in acute distress.    Appearance: She is well-developed. She is not ill-appearing, toxic-appearing or diaphoretic.  HENT:     Head: Normocephalic and atraumatic.     Nose: Nose normal.     Mouth/Throat:     Mouth: Mucous membranes are moist.  Eyes:     Pupils: Pupils are equal, round, and reactive to light.  Cardiovascular:     Rate and Rhythm: Normal rate.     Pulses: Normal pulses.          Radial pulses are 2+ on the right side and 2+ on the left side.       Dorsalis pedis pulses are 2+ on the right side and 2+ on the left side.     Heart sounds: Normal heart sounds.  Pulmonary:     Effort: Pulmonary effort is normal. No respiratory distress.     Breath sounds: Normal breath sounds.  Abdominal:     General: Bowel sounds are normal. There is no distension.      Palpations: Abdomen is soft.  Musculoskeletal:        General: Normal range of motion.     Cervical back: Normal range of motion.     Comments: No bony tenderness, compartments soft, full range of motion Lifts bil legs off bed without difficulty Full ROM  Skin:    General: Skin is warm and dry.     Capillary Refill: Capillary refill takes less than 2 seconds.  Neurological:     General: No focal deficit present.     Mental Status: She is alert and oriented to person, place, and time.     Cranial  Nerves: Cranial nerves 2-12 are intact.     Sensory: Sensation is intact.     Motor: Motor function is intact.     Coordination: Coordination is intact.     Gait: Gait is intact.  Psychiatric:        Mood and Affect: Mood normal.    ED Results / Procedures / Treatments   Labs (all labs ordered are listed, but only abnormal results are displayed) Labs Reviewed  CBC WITH DIFFERENTIAL/PLATELET - Abnormal; Notable for the following components:      Result Value   RBC 3.76 (*)    Hemoglobin 11.9 (*)    HCT 35.9 (*)    All other components within normal limits  BASIC METABOLIC PANEL - Abnormal; Notable for the following components:   Glucose, Bld 124 (*)    Creatinine, Ser 1.38 (*)    GFR, Estimated 40 (*)    All other components within normal limits  CBG MONITORING, ED - Abnormal; Notable for the following components:   Glucose-Capillary 116 (*)    All other components within normal limits  CK  URINALYSIS, ROUTINE W REFLEX MICROSCOPIC  TROPONIN I (HIGH SENSITIVITY)  TROPONIN I (HIGH SENSITIVITY)    EKG EKG Interpretation  Date/Time:  Sunday December 09 2021 19:16:15 EDT Ventricular Rate:  62 PR Interval:  166 QRS Duration: 122 QT Interval:  430 QTC Calculation: 437 R Axis:   32 Text Interpretation: Sinus rhythm Nonspecific T wave abnormality Confirmed by Lajean Saver [phone]) on 12/09/2021 9:36:48 PM  Radiology CT Angio Chest PE W and/or Wo Contrast  Result Date:  12/09/2021 CLINICAL DATA:  Syncope EXAM: CT ANGIOGRAPHY CHEST WITH CONTRAST TECHNIQUE: Multidetector CT imaging of the chest was performed using the standard protocol during bolus administration of intravenous contrast. Multiplanar CT image reconstructions and MIPs were obtained to evaluate the vascular anatomy. RADIATION DOSE REDUCTION: This exam was performed according to the departmental dose-optimization program which includes automated exposure control, adjustment of the mA and/or kV according to patient size and/or use of iterative reconstruction technique. CONTRAST:  51mL OMNIPAQUE IOHEXOL 350 MG/ML SOLN COMPARISON:  12/05/2021 FINDINGS: Cardiovascular: There is homogeneous enhancement in thoracic aorta. Scattered calcifications are seen in thoracic aorta and its major branches including coronary arteries. 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 Pacemaker battery is seen in the left infraclavicular region. There are no intraluminal filling defects in pulmonary artery branches. Mediastinum/Nodes: No significant lymphadenopathy is seen. Thyroid is enlarged. Lungs/Pleura: There is no focal pulmonary consolidation. In image 60 of series 7, there is 2.5 x 2 cm nodular density in superior segment of left lower lobe which measured 2.3 cm in maximum diameter in the previous study. This difference could easily be due to difference in the measuring techniques. In image 52 of series 7, there is a 6 mm faint nodule in left upper lobe which appears stable. In image 72, there is 1.6 x 1.2 cm nodule in right lower lobe with no significant change. In image 114, there is 1.1 x 0.7 cm nodule in right lower lobe which has not changed significantly. There is no pleural effusion or pneumothorax. Upper Abdomen: There is nodularity of the liver surface. Surgical clips are seen in gallbladder fossa. In image 109 of series 6, there is 8 mm low-density in right lobe of liver with no significant change. Small hiatal  hernia is seen. Musculoskeletal: No acute findings are seen. Review of the MIP images confirms the above findings. IMPRESSION: There is no evidence of pulmonary  artery embolism. There is no evidence of thoracic aortic dissection. There are multiple nodular densities in both lungs largest measuring 2.5 cm in diameter in the superior segment of left lower lobe. Overall, no significant interval changes are noted in multiple nodules in both lungs. Findings may be due to inflammatory or neoplastic process. Follow-up PET-CT as clinically warranted may be considered. No significant lymphadenopathy is seen in mediastinum. Other findings as described in the body of the report. Electronically Signed   By: Elmer Picker M.D.   On: 12/09/2021 20:45   CT HEAD WO CONTRAST (5MM)  Result Date: 12/09/2021 CLINICAL DATA:  Hypotension EXAM: CT HEAD WITHOUT CONTRAST TECHNIQUE: Contiguous axial images were obtained from the base of the skull through the vertex without intravenous contrast. RADIATION DOSE REDUCTION: This exam was performed according to the departmental dose-optimization program which includes automated exposure control, adjustment of the mA and/or kV according to patient size and/or use of iterative reconstruction technique. COMPARISON:  Head CT January 26, 2021. FINDINGS: Brain: No evidence of acute large vascular territory infarction, hemorrhage, hydrocephalus, extra-axial collection or mass lesion/mass effect. Vascular: No hyperdense vessel. Calcified atherosclerosis at the skull base. Skull: Normal. Negative for fracture or focal lesion. Sinuses/Orbits: Visualized portions of the paranasal sinuses are predominantly clear. Orbits are grossly unremarkable. Other: Mastoid air cells are predominantly clear. IMPRESSION: No acute intracranial abnormality. Electronically Signed   By: Dahlia Bailiff M.D.   On: 12/09/2021 20:32   DG Chest 2 View  Result Date: 12/09/2021 CLINICAL DATA:  Syncope and cough EXAM:  CHEST - 2 VIEW COMPARISON:  Radiographs 02/16/2021 and CT chest 12/05/2021 FINDINGS: Left chest wall ICD. Sternotomy and CABG. Stable cardiomediastinal silhouette. Aortic atherosclerotic calcification. No focal consolidation, pleural effusion, or pneumothorax. Consolidation in the superior segment left lower lobe seen on CT 12/05/2021 is redemonstrated on lateral radiograph. No acute osseous abnormality. IMPRESSION: No acute abnormality. Known pulmonary nodules are better demonstrated on CT 12/05/2021. Electronically Signed   By: Placido Sou M.D.   On: 12/09/2021 20:08   VAS [place] LOWER EXTREMITY VENOUS (DVT) (ONLY [facility] & [facility])  Result Date: 12/09/2021  Lower Venous DVT Study Patient Name:  Karen [name]  Date of Exam:   12/09/2021 Medical Rec #: [id number]         Accession #:    [id number] Date of Birth: [birth year]          Patient Gender: F Patient Age:   55 years Exam Location:  [facility] Procedure:      VAS [place] LOWER EXTREMITY VENOUS (DVT) Referring Phys: Odie Edmonds --------------------------------------------------------------------------------  Indications: Pain.  Comparison Study: No prior left LEV Performing Technologist: Sharion Dove RVS  Examination Guidelines: A complete evaluation includes B-mode imaging, spectral Doppler, color Doppler, and power Doppler as needed of all accessible portions of each vessel. Bilateral testing is considered an integral part of a complete examination. Limited examinations for reoccurring indications may be performed as noted. The reflux portion of the exam is performed with the patient in reverse Trendelenburg.  +-----+---------------+---------+-----------+----------+--------------+ RIGHTCompressibilityPhasicitySpontaneityPropertiesThrombus Aging +-----+---------------+---------+-----------+----------+--------------+ CFV  Full           Yes      Yes                                  +-----+---------------+---------+-----------+----------+--------------+   +---------+---------------+---------+-----------+----------+--------------+ LEFT     CompressibilityPhasicitySpontaneityPropertiesThrombus Aging +---------+---------------+---------+-----------+----------+--------------+ CFV      Full  Yes      Yes                                 +---------+---------------+---------+-----------+----------+--------------+ SFJ      Full                                                        +---------+---------------+---------+-----------+----------+--------------+ FV Prox  Full                                                        +---------+---------------+---------+-----------+----------+--------------+ FV Mid   Full                                                        +---------+---------------+---------+-----------+----------+--------------+ FV DistalFull                                                        +---------+---------------+---------+-----------+----------+--------------+ PFV      Full                                                        +---------+---------------+---------+-----------+----------+--------------+ POP      Full           Yes      Yes                                 +---------+---------------+---------+-----------+----------+--------------+ PTV      Full                                                        +---------+---------------+---------+-----------+----------+--------------+ PERO     Full                                                        +---------+---------------+---------+-----------+----------+--------------+ Gastroc  Full                                                        +---------+---------------+---------+-----------+----------+--------------+     Summary: RIGHT: - No evidence of common femoral vein obstruction.  LEFT: - No evidence of deep vein thrombosis in the lower  extremity. No indirect evidence of obstruction proximal to the inguinal ligament. - No cystic structure found in the popliteal fossa.  *See table(s) above for measurements and observations.    Preliminary     Procedures Procedures    Medications Ordered in ED Medications  sodium chloride 0.9 % bolus 1,000 mL (0 mLs Intravenous Stopped 12/09/21 2217)  iohexol (OMNIPAQUE) 350 MG/ML injection 80 mL (70 mLs Intravenous Contrast Given 12/09/21 2024)    ED Course/ Medical Decision Making/ A&P    76 year old here for evaluation of near syncope.  Was outside at family picnic all day in the heat.  Had not had anything to eat or drink.  No chest pain, shortness of breath.  Did have a slight headache on EMS arrival however has history of chronic headache denies sudden onset thunderclap headache.  She is nonfocal neuro exam without deficits.  Does mention had some left leg pain over the last month, worsening since Thursday.  She walks with a cane.  No significant lower extremity swelling, numbness or weakness.  No back pain.  She is currently being assessed by cardiology for lung cancer.  No history of PE or DVT.  Feels improved after IV fluids with EMS.  Labs and imaging personally viewed and interpreted:  CBC without leukocytosis, hemoglobin 0.9, similar to prior CK 113 UA neg for infection BMP creatinine 1.38 at baseline Trop 3>>3 CTA chest without acute process, known modules [place] neg for DVT CT head without significant abnormality Xray chest with known nodules EKG without ischemic changes  ICD interrogated, no arrhythmias/ firing since Mar 06, 2021  Reassessed.  Feels significantly improved.  Currently asymptomatic, ambulatory in room, tolerating p.o. intake.  Blood pressures within normal limits.  Suggest likely she had near syncope due to the heat, lack of eating and drinking today.  Encourage rest, increase p.o. fluids at home, follow-up outpatient, return for any worsening symptoms which  patient and family are agreeable for  The patient has been appropriately medically screened and/or stabilized in the ED. I have low suspicion for any other emergent medical condition which would require further screening, evaluation or treatment in the ED or require inpatient management.  Patient is hemodynamically stable and in no acute distress.  Patient able to ambulate in department prior to ED.  Evaluation does not show acute pathology that would require ongoing or additional emergent interventions while in the emergency department or further inpatient treatment.  I have discussed the diagnosis with the patient and answered all questions.  Pain is been managed while in the emergency department and patient has no further complaints prior to discharge.  Patient is comfortable with plan discussed in room and is stable for discharge at this time.  I have discussed strict return precautions for returning to the emergency department.  Patient was encouraged to follow-up with PCP/specialist refer to at discharge.                            Medical Decision Making Amount and/or Complexity of Data Reviewed External Data Reviewed: labs, radiology, ECG and notes. Labs: ordered. Decision-making details documented in ED Course. Radiology: ordered and independent interpretation performed. Decision-making details documented in ED Course. ECG/medicine tests: ordered and independent interpretation performed. Decision-making details documented in ED Course.  Risk OTC drugs. Prescription drug management. Decision regarding hospitalization. Diagnosis or treatment significantly limited by social determinants of health.  Final Clinical Impression(s) / ED Diagnoses Final diagnoses:  Near syncope  Lung nodule  History of implantable cardiac defibrillator (ICD)    Rx / DC Orders ED Discharge Orders     None          Jacki Couse A, PA-C 12/09/21 2301    Lajean Saver, MD 12/12/21  1037

## 2021-12-09 NOTE — Progress Notes (Signed)
VASCULAR LAB    Left lower extremity venous duplex has been performed.  See CV proc for preliminary results.  Messaged results to [facility], PA-C via secure chat  [place]ale Wherry, RVT 12/09/2021, 6:51 PM

## 2021-12-09 NOTE — ED Triage Notes (Signed)
Pt BIB EMS due to hypotension. Pt was at park and became dizzy. Pt states she hasn't eaten all day. Pt received 625ml of NS via ems. Pt was initially 70 systolic and is now 037. VSS.

## 2021-12-09 NOTE — ED Notes (Signed)
RN reached out to phlebotomy to stick pt.

## 2021-12-11 ENCOUNTER — Encounter: Payer: Self-pay | Admitting: Internal Medicine

## 2021-12-11 ENCOUNTER — Other Ambulatory Visit: Payer: Self-pay

## 2021-12-11 ENCOUNTER — Inpatient Hospital Stay (HOSPITAL_BASED_OUTPATIENT_CLINIC_OR_DEPARTMENT_OTHER): Payer: Medicare Other | Admitting: Internal Medicine

## 2021-12-11 VITALS — BP 116/61 | HR 69 | Temp 98.6°F | Resp 15 | Ht 63.0 in | Wt 154.3 lb

## 2021-12-11 DIAGNOSIS — C3490 Malignant neoplasm of unspecified part of unspecified bronchus or lung: Secondary | ICD-10-CM

## 2021-12-11 DIAGNOSIS — C3432 Malignant neoplasm of lower lobe, left bronchus or lung: Secondary | ICD-10-CM | POA: Diagnosis not present

## 2021-12-11 MED ORDER — DOXYCYCLINE HYCLATE 100 MG PO TABS: 100.0000 mg | ORAL_TABLET | Freq: Two times a day (BID) | ORAL | 0 refills | Status: DC

## 2021-12-11 NOTE — Progress Notes (Signed)
Karen [name] [phone]   Fax:([phone]) [place], Karen [name] [place] [address]  DIAGNOSIS: Stage IB (T1b, N0, M0) non-small cell lung cancer, adenocarcinoma presented with left lower lobe lung nodule diagnosed in September 2022.   PRIOR THERAPY: SBRT under the care of Dr. Sondra Come. Last treatment on 02/15/21.    CURRENT THERAPY: Observation   INTERVAL HISTORY: Karen [name] 76 y.o. female returns to the clinic today for follow-up visit.  The patient is feeling fine today with no concerning complaints except for mild cough.  She also has some aspiration problem with choking on her food.  She denied having any current chest pain, shortness of breath or hemoptysis.  She has no nausea, vomiting, diarrhea or constipation.  She has no headache or visual changes.  She had repeat CT scan of the chest performed recently that showed suspicious opacity in the lungs bilaterally and she is here for evaluation and discussion of her scans and recommendation regarding her condition.  MEDICAL HISTORY: Past Medical History:  Diagnosis Date   Acute myocardial infarction, unspecified site, episode of care unspecified    Adenocarcinoma, lung ([place]) 02/16/2021   Allergic rhinitis, cause unspecified    CAD (coronary artery disease)    GERD (gastroesophageal reflux disease)    Headache(784.0)    History of radiation therapy    left lung SBRT 02/08/2021, 02/13/2021, 02/15/2021  Dr Gery Pray   HTN (hypertension)    Other and unspecified hyperlipidemia    Other diseases of lung, not elsewhere classified    Postsurgical aortocoronary bypass status    hx of it.     ALLERGIES:  is allergic to codeine and percocet [oxycodone-acetaminophen].  MEDICATIONS:  Current Outpatient Medications  Medication Sig Dispense Refill   acetaminophen (TYLENOL) 325 MG tablet Take 2 tablets (650 mg total) by mouth every 6 (six) hours  as needed. (Patient not taking: Reported on 12/09/2021) 60 tablet 0   acetaminophen (TYLENOL) 500 MG tablet Take 500 mg by mouth every 6 (six) hours as needed for headache.     albuterol (VENTOLIN HFA) 108 (90 Base) MCG/ACT inhaler Inhale 2 puffs into the lungs every 6 (six) hours as needed for wheezing or shortness of breath. 54 g 3   amLODipine (NORVASC) 10 MG tablet TAKE 1 TABLET BY MOUTH DAILY 90 tablet 2   atorvastatin (LIPITOR) 10 MG tablet TAKE 1 TABLET BY MOUTH DAILY 15 tablet 0   clopidogrel (PLAVIX) 75 MG tablet TAKE 1 TABLET BY MOUTH DAILY 15 tablet 0   cyclobenzaprine (FLEXERIL) 10 MG tablet Take 10 mg by mouth 3 (three) times daily as needed for muscle spasms.     famotidine (PEPCID) 40 MG tablet Take 40 mg by mouth at bedtime.     fluticasone (FLONASE) 50 MCG/ACT nasal spray Place 1 spray into both nostrils daily. (Patient taking differently: Place 1 spray into both nostrils daily as needed for allergies.) 18.2 mL 2   furosemide (LASIX) 20 MG tablet TAKE 1 TABLET BY MOUTH DAILY 90 tablet 2   guaiFENesin (MUCINEX) 600 MG 12 hr tablet Take 1 tablet (600 mg total) by mouth 2 (two) times daily. (Patient not taking: Reported on 12/09/2021) 60 tablet 3   isosorbide mononitrate (IMDUR) 60 MG 24 hr tablet Take 1 tablet (60 mg total) by mouth 2 (two) times daily. 30 tablet 0   lisinopril (ZESTRIL) 10 MG tablet Take 10 mg by mouth daily.  loratadine (CLARITIN) 10 MG tablet Take 1 tablet (10 mg total) by mouth daily. (Patient taking differently: Take 10 mg by mouth daily as needed for allergies.) 30 tablet 11   metoprolol tartrate (LOPRESSOR) 25 MG tablet TAKE 1 TABLET BY MOUTH TWICE DAILY 180 tablet 3   nitroGLYCERIN (NITROSTAT) 0.4 MG SL tablet Place 1 tablet (0.4 mg total) under the tongue every 5 (five) minutes as needed for chest pain (X3 DOSES MAX). 25 tablet 11   oxyCODONE-acetaminophen (PERCOCET) 7.5-325 MG tablet Take 1-2 tablets by mouth every 4 (four) hours as needed for moderate pain  (back pain).     pregabalin (LYRICA) 50 MG capsule Take 50 mg by mouth at bedtime as needed (leg pain).     ranolazine (RANEXA) 500 MG 12 hr tablet TAKE 1 TABLET BY MOUTH TWICE DAILY 180 tablet 3   Tiotropium Bromide Monohydrate (SPIRIVA RESPIMAT) 1.25 MCG/ACT AERS Inhale 2 puffs into the lungs daily. (Patient taking differently: Inhale 2 puffs into the lungs daily as needed (sob).) 4 g 0   valsartan (DIOVAN) 160 MG tablet Take 1 tablet (160 mg total) by mouth daily. (Patient not taking: Reported on 12/09/2021) 30 tablet 5   No current facility-administered medications for this visit.    SURGICAL HISTORY:  Past Surgical History:  Procedure Laterality Date   CARDIAC DEFIBRILLATOR PLACEMENT  03/2006   [employer]. remote-yes.    CORONARY ARTERY BYPASS GRAFT     x4   IMPLANTABLE CARDIOVERTER DEFIBRILLATOR GENERATOR CHANGE N/A 03/03/2013   Procedure: IMPLANTABLE CARDIOVERTER DEFIBRILLATOR GENERATOR CHANGE;  Surgeon: Deboraha Sprang, MD;  Location: [facility] CATH LAB;  Service: Cardiovascular;  Laterality: N/A;   stent implant     x1. possibly x2.     REVIEW OF SYSTEMS:  Constitutional: negative Eyes: negative Ears, nose, mouth, throat, and face: negative Respiratory: positive for cough Cardiovascular: negative Gastrointestinal: negative Genitourinary:negative Integument/breast: negative Hematologic/lymphatic: negative Musculoskeletal:negative Neurological: negative Behavioral/Psych: negative Endocrine: negative Allergic/Immunologic: negative   PHYSICAL EXAMINATION: General appearance: alert, cooperative, and no distress Head: Normocephalic, without obvious abnormality, atraumatic Neck: no adenopathy, no JVD, supple, symmetrical, trachea midline, and thyroid not enlarged, symmetric, no tenderness/mass/nodules Lymph nodes: Cervical, supraclavicular, and axillary nodes normal. Resp: clear to auscultation bilaterally Back: symmetric, no curvature. ROM normal. No CVA tenderness. Cardio: regular  rate and rhythm, S1, S2 normal, no murmur, click, rub or gallop GI: soft, non-tender; bowel sounds normal; no masses,  no organomegaly Extremities: extremities normal, atraumatic, no cyanosis or edema Neurologic: Alert and oriented X 3, normal strength and tone. Normal symmetric reflexes. Normal coordination and gait  ECOG PERFORMANCE STATUS: 1 - Symptomatic but completely ambulatory  Blood pressure 116/61, pulse 69, temperature 98.6 F (37 C), temperature source Oral, resp. rate 15, height 5\' 3"  (1.6 m), weight 154 lb 4.8 oz (70 kg), SpO2 100 %.  LABORATORY DATA: Lab Results  Component Value Date   WBC 5.8 12/09/2021   HGB 11.9 (L) 12/09/2021   HCT 35.9 (L) 12/09/2021   MCV 95.5 12/09/2021   PLT 183 12/09/2021      Chemistry      Component Value Date/Time   NA 140 12/09/2021 1950   NA 141 03/06/2021 0858   K 4.0 12/09/2021 1950   CL 108 12/09/2021 1950   CO2 24 12/09/2021 1950   BUN 14 12/09/2021 1950   BUN 15 03/06/2021 0858   CREATININE 1.38 (H) 12/09/2021 1950   CREATININE 1.29 (H) 11/29/2021 1151      Component Value Date/Time   CALCIUM 9.0  12/09/2021 1950   ALKPHOS 72 11/29/2021 1151   AST 11 (L) 11/29/2021 1151   ALT 8 11/29/2021 1151   BILITOT 0.5 11/29/2021 1151       RADIOGRAPHIC STUDIES: VAS [place] LOWER EXTREMITY VENOUS (DVT) (ONLY [facility] & [facility])  Result Date: 12/10/2021  Lower Venous DVT Study Patient Name:  Karen [name]  Date of Exam:   12/09/2021 Medical Rec #: [id number]         Accession #:    [id number] Date of Birth: 12-04-1945          Patient Gender: F Patient Age:   76 years Exam Location:  [facility] Procedure:      VAS [place] LOWER EXTREMITY VENOUS (DVT) Referring Phys: BRITNI HENDERLY --------------------------------------------------------------------------------  Indications: Pain.  Comparison Study: No prior left LEV Performing Technologist: Sharion Dove RVS  Examination Guidelines: A complete evaluation includes B-mode imaging, spectral Doppler,  color Doppler, and power Doppler as needed of all accessible portions of each vessel. Bilateral testing is considered an integral part of a complete examination. Limited examinations for reoccurring indications may be performed as noted. The reflux portion of the exam is performed with the patient in reverse Trendelenburg.  +-----+---------------+---------+-----------+----------+--------------+ RIGHTCompressibilityPhasicitySpontaneityPropertiesThrombus Aging +-----+---------------+---------+-----------+----------+--------------+ CFV  Full           Yes      Yes                                 +-----+---------------+---------+-----------+----------+--------------+   +---------+---------------+---------+-----------+----------+--------------+ LEFT     CompressibilityPhasicitySpontaneityPropertiesThrombus Aging +---------+---------------+---------+-----------+----------+--------------+ CFV      Full           Yes      Yes                                 +---------+---------------+---------+-----------+----------+--------------+ SFJ      Full                                                        +---------+---------------+---------+-----------+----------+--------------+ FV Prox  Full                                                        +---------+---------------+---------+-----------+----------+--------------+ FV Mid   Full                                                        +---------+---------------+---------+-----------+----------+--------------+ FV DistalFull                                                        +---------+---------------+---------+-----------+----------+--------------+ PFV      Full                                                        +---------+---------------+---------+-----------+----------+--------------+  POP      Full           Yes      Yes                                  +---------+---------------+---------+-----------+----------+--------------+ PTV      Full                                                        +---------+---------------+---------+-----------+----------+--------------+ PERO     Full                                                        +---------+---------------+---------+-----------+----------+--------------+ Gastroc  Full                                                        +---------+---------------+---------+-----------+----------+--------------+     Summary: RIGHT: - No evidence of common femoral vein obstruction.  LEFT: - No evidence of deep vein thrombosis in the lower extremity. No indirect evidence of obstruction proximal to the inguinal ligament. - No cystic structure found in the popliteal fossa.  *See table(s) above for measurements and observations. Electronically signed by Servando Snare MD on 12/10/2021 at 2:26:07 PM.    Final    CT Angio Chest PE W and/or Wo Contrast  Result Date: 12/09/2021 CLINICAL DATA:  Syncope EXAM: CT ANGIOGRAPHY CHEST WITH CONTRAST TECHNIQUE: Multidetector CT imaging of the chest was performed using the standard protocol during bolus administration of intravenous contrast. Multiplanar CT image reconstructions and MIPs were obtained to evaluate the vascular anatomy. RADIATION DOSE REDUCTION: This exam was performed according to the departmental dose-optimization program which includes automated exposure control, adjustment of the mA and/or kV according to patient size and/or use of iterative reconstruction technique. CONTRAST:  49mL OMNIPAQUE IOHEXOL 350 MG/ML SOLN COMPARISON:  12/05/2021 FINDINGS: Cardiovascular: There is homogeneous enhancement in thoracic aorta. Scattered calcifications are seen in thoracic aorta and its major branches including coronary arteries. 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 Pacemaker battery is seen in the left infraclavicular region. There are no  intraluminal filling defects in pulmonary artery branches. Mediastinum/Nodes: No significant lymphadenopathy is seen. Thyroid is enlarged. Lungs/Pleura: There is no focal pulmonary consolidation. In image 60 of series 7, there is 2.5 x 2 cm nodular density in superior segment of left lower lobe which measured 2.3 cm in maximum diameter in the previous study. This difference could easily be due to difference in the measuring techniques. In image 52 of series 7, there is a 6 mm faint nodule in left upper lobe which appears stable. In image 72, there is 1.6 x 1.2 cm nodule in right lower lobe with no significant change. In image 114, there is 1.1 x 0.7 cm nodule in right lower lobe which has not changed significantly. There is no pleural effusion or pneumothorax. Upper Abdomen: There is nodularity of the liver surface. Surgical clips are  seen in gallbladder fossa. In image 109 of series 6, there is 8 mm low-density in right lobe of liver with no significant change. Small hiatal hernia is seen. Musculoskeletal: No acute findings are seen. Review of the MIP images confirms the above findings. IMPRESSION: There is no evidence of pulmonary artery embolism. There is no evidence of thoracic aortic dissection. There are multiple nodular densities in both lungs largest measuring 2.5 cm in diameter in the superior segment of left lower lobe. Overall, no significant interval changes are noted in multiple nodules in both lungs. Findings may be due to inflammatory or neoplastic process. Follow-up PET-CT as clinically warranted may be considered. No significant lymphadenopathy is seen in mediastinum. Other findings as described in the body of the report. Electronically Signed   By: Elmer Picker M.D.   On: 12/09/2021 20:45   CT HEAD WO CONTRAST (5MM)  Result Date: 12/09/2021 CLINICAL DATA:  Hypotension EXAM: CT HEAD WITHOUT CONTRAST TECHNIQUE: Contiguous axial images were obtained from the base of the skull through the  vertex without intravenous contrast. RADIATION DOSE REDUCTION: This exam was performed according to the departmental dose-optimization program which includes automated exposure control, adjustment of the mA and/or kV according to patient size and/or use of iterative reconstruction technique. COMPARISON:  Head CT January 26, 2021. FINDINGS: Brain: No evidence of acute large vascular territory infarction, hemorrhage, hydrocephalus, extra-axial collection or mass lesion/mass effect. Vascular: No hyperdense vessel. Calcified atherosclerosis at the skull base. Skull: Normal. Negative for fracture or focal lesion. Sinuses/Orbits: Visualized portions of the paranasal sinuses are predominantly clear. Orbits are grossly unremarkable. Other: Mastoid air cells are predominantly clear. IMPRESSION: No acute intracranial abnormality. Electronically Signed   By: Dahlia Bailiff M.D.   On: 12/09/2021 20:32   DG Chest 2 View  Result Date: 12/09/2021 CLINICAL DATA:  Syncope and cough EXAM: CHEST - 2 VIEW COMPARISON:  Radiographs 02/16/2021 and CT chest 12/05/2021 FINDINGS: Left chest wall ICD. Sternotomy and CABG. Stable cardiomediastinal silhouette. Aortic atherosclerotic calcification. No focal consolidation, pleural effusion, or pneumothorax. Consolidation in the superior segment left lower lobe seen on CT 12/05/2021 is redemonstrated on lateral radiograph. No acute osseous abnormality. IMPRESSION: No acute abnormality. Known pulmonary nodules are better demonstrated on CT 12/05/2021. Electronically Signed   By: Placido Sou M.D.   On: 12/09/2021 20:08   CT Chest W Contrast  Result Date: 12/06/2021 CLINICAL DATA:  Primary Cancer Type: Lung Imaging Indication: Routine surveillance Interval therapy since last imaging? No Initial Cancer Diagnosis Date: 01/03/2021; Established by: Biopsy-proven Detailed Pathology: Stage IB non-small cell lung cancer, adenocarcinoma. Primary Tumor location: Left lower lobe. Surgeries: ICD.  CABG. Chemotherapy: No Immunotherapy? No Radiation therapy? Yes; Date Range: 02/08/2021 - 02/15/2021; Target: Left lung * Tracking Code: BO * EXAM: CT CHEST WITH CONTRAST TECHNIQUE: Multidetector CT imaging of the chest was performed during intravenous contrast administration. RADIATION DOSE REDUCTION: This exam was performed according to the departmental dose-optimization program which includes automated exposure control, adjustment of the mA and/or kV according to patient size and/or use of iterative reconstruction technique. CONTRAST:  73mL OMNIPAQUE IOHEXOL 300 MG/ML  SOLN COMPARISON:  Most recent CT chest 06/04/2021 FINDINGS: Cardiovascular: No acute findings. Aortic and coronary atherosclerotic calcification incidentally noted. Prior CABG again noted as well as pacemaker leads in the right heart. Mediastinum/Nodes: No masses or pathologically enlarged lymph nodes identified. Lungs/Pleura: Spiculated nodule in the superior segment left lower lobe is significantly increased in size since previous study, currently measuring 2.3 x 2.1 cm on image  51/5, compared to 1.7 x 1.0 cm previously. Sub-solid nodule in the posterior right lower lobe measures 1.7 x 1.3 cm, and shows no significant change in size or appearance since prior study. Small subpleural nodular density in the posteromedial right lower lobe measures 10 x 6 mm on image 116/5, without significant change since prior study. Upper Abdomen: A few tiny hepatic cysts remain stable. Otherwise unremarkable. Musculoskeletal:  No suspicious bone lesions. IMPRESSION: Significant increase in size of 2.3 cm spiculated nodule in superior segment of the left lower lobe, consistent with primary bronchogenic carcinoma. Stable 1.7 cm sub-solid nodule in posterior right lower lobe, which remains suspicious for low-grade adenocarcinoma. Stable 10 mm indeterminate pleural-based nodule in the posteromedial right lower lobe. No evidence of lymphadenopathy or pleural effusion.  Aortic Atherosclerosis (ICD10-I70.0). Electronically Signed   By: Marlaine Hind M.D.   On: 12/06/2021 11:15   CUP PACEART REMOTE DEVICE CHECK  Result Date: 11/26/2021 Scheduled remote reviewed. Normal device function.  Next remote 91 days. [place]   ASSESSMENT AND PLAN: This is a very pleasant 76 years old African-American female with Stage IB (T1b, N0, M0) non-small cell lung cancer, adenocarcinoma presented with left lower lobe lung nodule diagnosed in September 2022.  She is status post SBRT under the care of Dr. Sondra Come. Last treatment on 02/15/21.  The patient is currently on observation and she is feeling fine with no concerning complaints except for the arthralgia. She had repeat CT scan of the chest performed recently.  I personally and independently reviewed the scan images and discussed the result and showed the images to the patient today. Unfortunately her scan showed bilateral pulmonary opacities suspicious for low-grade adenocarcinoma but inflammatory process could not be completely ruled out especially with her aspiration issues. I discussed with the patient her options including proceeding with a PET scan versus treatment with a course of doxycycline for 2 weeks followed by repeat CT scan of the chest in around 2 months for further evaluation of these lesions.  If the lesions persisted, I will consider her for a PET scan and biopsy at that time. The patient is in agreement with waiting and doing the scan in 2 months. She was advised to call immediately if she has any other concerning symptoms in the interval. The patient voices understanding of current disease status and treatment options and is in agreement with the current care plan.  All questions were answered. The patient knows to call the clinic with any problems, questions or concerns. We can certainly see the patient much sooner if necessary. The total time spent in the appointment was 30 minutes.  Disclaimer: This note was  dictated with voice recognition software. Similar sounding words can inadvertently be transcribed and may not be corrected upon review.

## 2021-12-23 ENCOUNTER — Other Ambulatory Visit: Payer: Self-pay | Admitting: Internal Medicine

## 2021-12-29 NOTE — Progress Notes (Signed)
Remote ICD transmission.   

## 2022-01-11 ENCOUNTER — Other Ambulatory Visit: Payer: Self-pay | Admitting: Interventional Cardiology

## 2022-01-17 ENCOUNTER — Telehealth: Payer: Self-pay | Admitting: Emergency Medicine

## 2022-01-17 NOTE — Telephone Encounter (Signed)
Patient is returning a call that she received today.  She stated that they did not leave a message.  Please call patient back at [phone]

## 2022-01-18 NOTE — Telephone Encounter (Signed)
Called and spoke with pt stating that it might have been an automated phone call stating that it was time to schedule her next appt and she verbalized understanding. Have scheduled pt's next appt with Dr. Lamonte Sakai which will be 6 month follow up. Nothing further needed.

## 2022-01-24 ENCOUNTER — Telehealth: Payer: Self-pay | Admitting: Internal Medicine

## 2022-01-24 NOTE — Telephone Encounter (Signed)
Called patient regarding upcoming October appointments, patient is notified.  

## 2022-01-27 ENCOUNTER — Other Ambulatory Visit: Payer: Self-pay | Admitting: Interventional Cardiology

## 2022-02-04 ENCOUNTER — Inpatient Hospital Stay: Payer: Medicare Other | Attending: Internal Medicine

## 2022-02-04 ENCOUNTER — Ambulatory Visit (HOSPITAL_COMMUNITY)
Admission: RE | Admit: 2022-02-04 | Discharge: 2022-02-04 | Disposition: A | Discharge status: Home / Self Care | Payer: Medicare Other | Source: Ambulatory Visit | Attending: Internal Medicine | Admitting: Internal Medicine

## 2022-02-04 ENCOUNTER — Other Ambulatory Visit: Payer: Self-pay

## 2022-02-04 DIAGNOSIS — C3490 Malignant neoplasm of unspecified part of unspecified bronchus or lung: Secondary | ICD-10-CM | POA: Insufficient documentation

## 2022-02-04 DIAGNOSIS — C3432 Malignant neoplasm of lower lobe, left bronchus or lung: Secondary | ICD-10-CM | POA: Insufficient documentation

## 2022-02-04 DIAGNOSIS — Z79899 Other long term (current) drug therapy: Secondary | ICD-10-CM | POA: Insufficient documentation

## 2022-02-04 LAB — CMP (CANCER CENTER ONLY)
ALT: 11 U/L (ref 0–44)
AST: 12 U/L — ABNORMAL LOW (ref 15–41)
Albumin: 4.2 g/dL (ref 3.5–5.0)
Alkaline Phosphatase: 80 U/L (ref 38–126)
Anion gap: 4 — ABNORMAL LOW (ref 5–15)
BUN: 14 mg/dL (ref 8–23)
CO2: 32 mmol/L (ref 22–32)
Calcium: 9.5 mg/dL (ref 8.9–10.3)
Chloride: 104 mmol/L (ref 98–111)
Creatinine: 1.31 mg/dL — ABNORMAL HIGH (ref 0.44–1.00)
GFR, Estimated: 42 mL/min — ABNORMAL LOW (ref >60)
Glucose, Bld: 98 mg/dL (ref 70–99)
Potassium: 4.2 mmol/L (ref 3.5–5.1)
Sodium: 140 mmol/L (ref 135–145)
Total Bilirubin: 0.4 mg/dL (ref 0.3–1.2)
Total Protein: 7.1 g/dL (ref 6.5–8.1)

## 2022-02-04 LAB — CBC WITH DIFFERENTIAL (CANCER CENTER ONLY)
Abs Immature Granulocytes: 0 10*3/uL (ref 0.00–0.07)
Basophils Absolute: 0 10*3/uL (ref 0.0–0.1)
Basophils Relative: 0 %
Eosinophils Absolute: 0.1 10*3/uL (ref 0.0–0.5)
Eosinophils Relative: 2 %
HCT: 38.1 % (ref 36.0–46.0)
Hemoglobin: 12.9 g/dL (ref 12.0–15.0)
Immature Granulocytes: 0 %
Lymphocytes Relative: 44 %
Lymphs Abs: 2.1 10*3/uL (ref 0.7–4.0)
MCH: 31.9 pg (ref 26.0–34.0)
MCHC: 33.9 g/dL (ref 30.0–36.0)
MCV: 94.1 fL (ref 80.0–100.0)
Monocytes Absolute: 0.4 10*3/uL (ref 0.1–1.0)
Monocytes Relative: 8 %
Neutro Abs: 2.2 10*3/uL (ref 1.7–7.7)
Neutrophils Relative %: 46 %
Platelet Count: 212 10*3/uL (ref 150–400)
RBC: 4.05 MIL/uL (ref 3.87–5.11)
RDW: 12 % (ref 11.5–15.5)
WBC Count: 4.7 10*3/uL (ref 4.0–10.5)
nRBC: 0 % (ref 0.0–0.2)

## 2022-02-04 MED ORDER — IOHEXOL 300 MG/ML  SOLN
75.0000 mL | Freq: Once | INTRAMUSCULAR | Status: AC | PRN
  Administered 2022-02-04: 75 mL via INTRAVENOUS

## 2022-02-06 ENCOUNTER — Inpatient Hospital Stay (HOSPITAL_BASED_OUTPATIENT_CLINIC_OR_DEPARTMENT_OTHER): Payer: Medicare Other | Admitting: Internal Medicine

## 2022-02-06 ENCOUNTER — Other Ambulatory Visit: Payer: Self-pay

## 2022-02-06 VITALS — BP 121/64 | HR 82 | Temp 98.8°F | Resp 19 | Wt 152.6 lb

## 2022-02-06 DIAGNOSIS — C3490 Malignant neoplasm of unspecified part of unspecified bronchus or lung: Secondary | ICD-10-CM | POA: Diagnosis not present

## 2022-02-06 DIAGNOSIS — Z79899 Other long term (current) drug therapy: Secondary | ICD-10-CM | POA: Diagnosis not present

## 2022-02-06 DIAGNOSIS — C3432 Malignant neoplasm of lower lobe, left bronchus or lung: Secondary | ICD-10-CM | POA: Diagnosis present

## 2022-02-06 NOTE — Progress Notes (Signed)
[facility] [phone]   Fax:([phone]) [place], [place] [address]  DIAGNOSIS: Stage IB (T1b, N0, M0) non-small cell lung cancer, adenocarcinoma presented with left lower lobe lung nodule diagnosed in September 2022.   PRIOR THERAPY: SBRT under the care of Dr. Sondra Come. Last treatment on 02/15/21.    CURRENT THERAPY: Observation   INTERVAL HISTORY: Karen [name] 76 y.o. female returns to the clinic today for follow-up visit.  The patient is feeling fine today with no concerning complaints except for mild shortness of breath with exertion but no significant chest pain, cough or hemoptysis.  She denied having any recent weight loss or night sweats.  She has no nausea, vomiting, diarrhea or constipation.  She has no headache or visual changes.  She was found on previous CT scan of the chest to have suspicious density in the lung bilaterally.  I ordered CT scan of the chest which was done recently and she is here for evaluation and discussion of her scan results and further recommendation regarding her condition.  MEDICAL HISTORY: Past Medical History:  Diagnosis Date   Acute myocardial infarction, unspecified site, episode of care unspecified    Adenocarcinoma, lung ([place]) 02/16/2021   Allergic rhinitis, cause unspecified    CAD (coronary artery disease)    GERD (gastroesophageal reflux disease)    Headache(784.0)    History of radiation therapy    left lung SBRT 02/08/2021, 02/13/2021, 02/15/2021  Dr Gery Pray   HTN (hypertension)    Other and unspecified hyperlipidemia    Other diseases of lung, not elsewhere classified    Postsurgical aortocoronary bypass status    hx of it.     ALLERGIES:  is allergic to codeine and percocet [oxycodone-acetaminophen].  MEDICATIONS:  Current Outpatient Medications  Medication Sig Dispense Refill   acetaminophen (TYLENOL) 500 MG tablet  Take 500 mg by mouth every 6 (six) hours as needed for headache.     albuterol (VENTOLIN HFA) 108 (90 Base) MCG/ACT inhaler Inhale 2 puffs into the lungs every 6 (six) hours as needed for wheezing or shortness of breath. 54 g 3   amLODipine (NORVASC) 10 MG tablet Take 1 tablet (10 mg total) by mouth daily. Pt needs appt. With Cardiologist for further refills. Thank You. 1st Attempt. 30 tablet 0   atorvastatin (LIPITOR) 10 MG tablet TAKE 1 TABLET BY MOUTH DAILY 15 tablet 0   clopidogrel (PLAVIX) 75 MG tablet TAKE 1 TABLET BY MOUTH DAILY 15 tablet 0   cyclobenzaprine (FLEXERIL) 10 MG tablet Take 10 mg by mouth 3 (three) times daily as needed for muscle spasms.     doxycycline (VIBRA-TABS) 100 MG tablet Take 1 tablet (100 mg total) by mouth 2 (two) times daily. 30 tablet 0   famotidine (PEPCID) 40 MG tablet Take 40 mg by mouth at bedtime.     fluticasone (FLONASE) 50 MCG/ACT nasal spray Place 1 spray into both nostrils daily. (Patient taking differently: Place 1 spray into both nostrils daily as needed for allergies.) 18.2 mL 2   furosemide (LASIX) 20 MG tablet TAKE 1 TABLET BY MOUTH DAILY. Please keep upcoming appointment for future refills 30 tablet 0   guaiFENesin (MUCINEX) 600 MG 12 hr tablet Take 1 tablet (600 mg total) by mouth 2 (two) times daily. (Patient not taking: Reported on 12/09/2021) 60 tablet 3   isosorbide mononitrate (IMDUR) 60 MG 24 hr tablet Take 1 tablet (60  mg total) by mouth 2 (two) times daily. 30 tablet 0   lisinopril (ZESTRIL) 10 MG tablet Take 1 tablet (10 mg total) by mouth daily. Pt needs appt. With Cardiologist for further refills. Thank You. 1st Attempt. 30 tablet 0   loratadine (CLARITIN) 10 MG tablet Take 1 tablet (10 mg total) by mouth daily. (Patient taking differently: Take 10 mg by mouth daily as needed for allergies.) 30 tablet 11   metoprolol tartrate (LOPRESSOR) 25 MG tablet TAKE 1 TABLET BY MOUTH TWICE DAILY 180 tablet 3   nitroGLYCERIN (NITROSTAT) 0.4 MG SL tablet  Place 1 tablet (0.4 mg total) under the tongue every 5 (five) minutes as needed for chest pain (X3 DOSES MAX). 25 tablet 11   oxyCODONE-acetaminophen (PERCOCET) 7.5-325 MG tablet Take 1-2 tablets by mouth every 4 (four) hours as needed for moderate pain (back pain).     pregabalin (LYRICA) 50 MG capsule Take 50 mg by mouth at bedtime as needed (leg pain).     ranolazine (RANEXA) 500 MG 12 hr tablet Take 1 tablet (500 mg total) by mouth 2 (two) times daily. Please keep upcoming appointment for future refills 60 tablet 0   Tiotropium Bromide Monohydrate (SPIRIVA RESPIMAT) 1.25 MCG/ACT AERS Inhale 2 puffs into the lungs daily. (Patient taking differently: Inhale 2 puffs into the lungs daily as needed (sob).) 4 g 0   valsartan (DIOVAN) 160 MG tablet Take 1 tablet (160 mg total) by mouth daily. (Patient not taking: Reported on 12/09/2021) 30 tablet 5   No current facility-administered medications for this visit.    SURGICAL HISTORY:  Past Surgical History:  Procedure Laterality Date   CARDIAC DEFIBRILLATOR PLACEMENT  03/2006   [employer]. remote-yes.    CORONARY ARTERY BYPASS GRAFT     x4   IMPLANTABLE CARDIOVERTER DEFIBRILLATOR GENERATOR CHANGE N/A 03/03/2013   Procedure: IMPLANTABLE CARDIOVERTER DEFIBRILLATOR GENERATOR CHANGE;  Surgeon: Deboraha Sprang, MD;  Location: [facility] CATH LAB;  Service: Cardiovascular;  Laterality: N/A;   stent implant     x1. possibly x2.     REVIEW OF SYSTEMS:  A comprehensive review of systems was negative except for: Respiratory: positive for dyspnea on exertion   PHYSICAL EXAMINATION: General appearance: alert, cooperative, and no distress Head: Normocephalic, without obvious abnormality, atraumatic Neck: no adenopathy, no JVD, supple, symmetrical, trachea midline, and thyroid not enlarged, symmetric, no tenderness/mass/nodules Lymph nodes: Cervical, supraclavicular, and axillary nodes normal. Resp: clear to auscultation bilaterally Back: symmetric, no curvature. ROM  normal. No CVA tenderness. Cardio: regular rate and rhythm, S1, S2 normal, no murmur, click, rub or gallop GI: soft, non-tender; bowel sounds normal; no masses,  no organomegaly Extremities: extremities normal, atraumatic, no cyanosis or edema  ECOG PERFORMANCE STATUS: 1 - Symptomatic but completely ambulatory  Blood pressure 121/64, pulse 82, temperature 98.8 F (37.1 C), temperature source Oral, resp. rate 19, weight 152 lb 9 oz (69.2 kg), SpO2 100 %.  LABORATORY DATA: Lab Results  Component Value Date   WBC 4.7 02/04/2022   HGB 12.9 02/04/2022   HCT 38.1 02/04/2022   MCV 94.1 02/04/2022   PLT 212 02/04/2022      Chemistry      Component Value Date/Time   NA 140 02/04/2022 1426   NA 141 03/06/2021 0858   K 4.2 02/04/2022 1426   CL 104 02/04/2022 1426   CO2 32 02/04/2022 1426   BUN 14 02/04/2022 1426   BUN 15 03/06/2021 0858   CREATININE 1.31 (H) 02/04/2022 1426  Component Value Date/Time   CALCIUM 9.5 02/04/2022 1426   ALKPHOS 80 02/04/2022 1426   AST 12 (L) 02/04/2022 1426   ALT 11 02/04/2022 1426   BILITOT 0.4 02/04/2022 1426       RADIOGRAPHIC STUDIES: CT Chest W Contrast  Result Date: 02/06/2022 CLINICAL DATA:  Non-small-cell lung cancer. Restaging. * Tracking Code: BO * EXAM: CT CHEST WITH CONTRAST TECHNIQUE: Multidetector CT imaging of the chest was performed during intravenous contrast administration. RADIATION DOSE REDUCTION: This exam was performed according to the departmental dose-optimization program which includes automated exposure control, adjustment of the mA and/or kV according to patient size and/or use of iterative reconstruction technique. CONTRAST:  5mL OMNIPAQUE IOHEXOL 300 MG/ML  SOLN COMPARISON:  Chest CT 12/09/2021.  Chest CT 12/05/2021. FINDINGS: Cardiovascular: The heart size is normal. No substantial pericardial effusion. Coronary artery calcification is evident. Status post CABG. Moderate atherosclerotic calcification is noted in the  wall of the thoracic aorta. Mediastinum/Nodes: No mediastinal lymphadenopathy. There is no hilar lymphadenopathy. The esophagus has normal imaging features. There is no axillary lymphadenopathy. Lungs/Pleura: Previously identified nodule in the superior segment left lower lobe is similar measuring 2.2 x 2.0 cm today compared to 2.3 x 2.1 cm previously. 1.9 cm ground-glass opacity in the posterior right lower lobe with 1.8 cm previously. 10 mm right lower lobe nodule on 112/7 is stable. No new suspicious pulmonary nodule or mass. No focal airspace consolidation. There is no evidence of pleural effusion. Upper Abdomen: Tiny hypoattenuating lesions in the right liver stable, too small to characterize but most likely benign. No followup recommended. Musculoskeletal: No worrisome lytic or sclerotic osseous abnormality. IMPRESSION: 1. Stable exam. No new or progressive findings. 2. Stable bilateral pulmonary nodules. Dominant superior segment left lower lobe pulmonary nodule is stable since CT of 12/06/2021. The sub solid/ground-glass nodule in the posterior right lower lobe is also not substantially changed. 3. Aortic Atherosclerosis (ICD10-I70.0). Electronically Signed   By: Misty Stanley M.D.   On: 02/06/2022 08:32    ASSESSMENT AND PLAN: This is a very pleasant 76 years old African-American female with Stage IB (T1b, N0, M0) non-small cell lung cancer, adenocarcinoma presented with left lower lobe lung nodule diagnosed in September 2022.  She is status post SBRT under the care of Dr. Sondra Come. Last treatment on 02/15/21.  Unfortunately her last CT scan showed bilateral pulmonary opacities suspicious for low-grade adenocarcinoma but inflammatory process could not be completely ruled out especially with her aspiration issues. The patient was followed by observation and she had repeat CT scan of the chest performed yesterday.  Her scan showed a stable disease with stable bilateral pulmonary nodules that did not resolve  in the interval but has been stable since the December 06, 2021 scan. I recommended for the patient to proceed with a PET scan for further evaluation of these nodules and to rule out any disease recurrence. I will see her back for follow-up visit in few weeks for evaluation and discussion of her PET scan results and further recommendation regarding her condition. The patient was advised to call immediately if she has any other concerning symptoms in the interval. The patient voices understanding of current disease status and treatment options and is in agreement with the current care plan.  All questions were answered. The patient knows to call the clinic with any problems, questions or concerns. We can certainly see the patient much sooner if necessary. The total time spent in the appointment was 20 minutes.  Disclaimer: This note  was dictated with voice recognition software. Similar sounding words can inadvertently be transcribed and may not be corrected upon review.

## 2022-02-22 ENCOUNTER — Encounter (HOSPITAL_COMMUNITY)
Admission: RE | Admit: 2022-02-22 | Discharge: 2022-02-22 | Disposition: A | Discharge status: Home / Self Care | Payer: Medicare Other | Source: Ambulatory Visit | Attending: Internal Medicine | Admitting: Internal Medicine

## 2022-02-22 DIAGNOSIS — C3490 Malignant neoplasm of unspecified part of unspecified bronchus or lung: Secondary | ICD-10-CM

## 2022-02-22 LAB — GLUCOSE, CAPILLARY: Glucose-Capillary: 114 mg/dL — ABNORMAL HIGH (ref 70–99)

## 2022-02-22 MED ORDER — FLUDEOXYGLUCOSE F - 18 (FDG) INJECTION
7.6000 | Freq: Once | INTRAVENOUS | Status: AC
  Administered 2022-02-22: 7.6 via INTRAVENOUS

## 2022-02-24 ENCOUNTER — Other Ambulatory Visit: Payer: Self-pay | Admitting: Student

## 2022-02-24 ENCOUNTER — Other Ambulatory Visit: Payer: Self-pay | Admitting: Interventional Cardiology

## 2022-02-25 ENCOUNTER — Ambulatory Visit (INDEPENDENT_AMBULATORY_CARE_PROVIDER_SITE_OTHER): Payer: Medicare Other

## 2022-02-25 DIAGNOSIS — I429 Cardiomyopathy, unspecified: Secondary | ICD-10-CM

## 2022-02-25 LAB — CUP PACEART REMOTE DEVICE CHECK
Battery Remaining Longevity: 54 mo
Battery Remaining Percentage: 60 %
Brady Statistic RV Percent Paced: 0 %
Date Time Interrogation Session: 20231030044100
HighPow Impedance: 70 Ohm
Implantable Lead Connection Status: 753985
Implantable Lead Implant Date: 20071205
Implantable Lead Location: 753860
Implantable Lead Model: 137
Implantable Lead Serial Number: 102363
Implantable Pulse Generator Implant Date: 20141105
Lead Channel Impedance Value: 469 Ohm
Lead Channel Pacing Threshold Amplitude: 0.7 V
Lead Channel Pacing Threshold Pulse Width: 0.5 ms
Lead Channel Setting Pacing Amplitude: 2.4 V
Lead Channel Setting Pacing Pulse Width: 0.5 ms
Lead Channel Setting Sensing Sensitivity: 0.6 mV
Pulse Gen Serial Number: 126146

## 2022-02-28 ENCOUNTER — Inpatient Hospital Stay: Payer: Medicare Other | Attending: Internal Medicine | Admitting: Internal Medicine

## 2022-02-28 ENCOUNTER — Other Ambulatory Visit: Payer: Self-pay

## 2022-02-28 VITALS — BP 121/63 | HR 95 | Temp 98.4°F | Resp 15 | Ht 63.0 in | Wt 154.6 lb

## 2022-02-28 DIAGNOSIS — Z79899 Other long term (current) drug therapy: Secondary | ICD-10-CM | POA: Insufficient documentation

## 2022-02-28 DIAGNOSIS — C3432 Malignant neoplasm of lower lobe, left bronchus or lung: Secondary | ICD-10-CM | POA: Insufficient documentation

## 2022-02-28 DIAGNOSIS — C3490 Malignant neoplasm of unspecified part of unspecified bronchus or lung: Secondary | ICD-10-CM

## 2022-02-28 NOTE — Progress Notes (Signed)
[facility] [phone]   [phone] [place], [place] [address]  DIAGNOSIS: Stage IB (T1b, N0, M0) non-small cell lung cancer, adenocarcinoma presented with left lower lobe lung nodule diagnosed in September 2022.   PRIOR THERAPY: SBRT under the care of Dr. Sondra Come. Last treatment on 02/15/21.    CURRENT THERAPY: Observation   INTERVAL HISTORY: Karen [name] 76 y.o. female returns to the clinic today for follow-up visit.  The patient is feeling fine today with no concerning complaints except for mild pain on the left side of the chest at the rib cage.  She denied having any cough, shortness of breath or hemoptysis.  She has no nausea, vomiting, diarrhea or constipation.  She has no weight loss or night sweats.  She has no headache or visual changes.  She had a PET scan performed recently for evaluation of the pulmonary nodules and she is here for evaluation and discussion of her PET scan results and recommendation regarding her condition.  MEDICAL HISTORY: Past Medical History:  Diagnosis Date   Acute myocardial infarction, unspecified site, episode of care unspecified    Adenocarcinoma, lung ([place]) 02/16/2021   Allergic rhinitis, cause unspecified    CAD (coronary artery disease)    GERD (gastroesophageal reflux disease)    Headache(784.0)    History of radiation therapy    left lung SBRT 02/08/2021, 02/13/2021, 02/15/2021  Dr Gery Pray   HTN (hypertension)    Other and unspecified hyperlipidemia    Other diseases of lung, not elsewhere classified    Postsurgical aortocoronary bypass status    hx of it.     ALLERGIES:  is allergic to codeine and percocet [oxycodone-acetaminophen].  MEDICATIONS:  Current Outpatient Medications  Medication Sig Dispense Refill   acetaminophen (TYLENOL) 500 MG tablet Take 500 mg by mouth every 6 (six) hours as needed for headache.      albuterol (VENTOLIN HFA) 108 (90 Base) MCG/ACT inhaler Inhale 2 puffs into the lungs every 6 (six) hours as needed for wheezing or shortness of breath. 54 g 3   amLODipine (NORVASC) 10 MG tablet TAKE 1 TABLET BY MOUTH DAILY 15 tablet 0   atorvastatin (LIPITOR) 10 MG tablet TAKE 1 TABLET BY MOUTH DAILY 15 tablet 0   clopidogrel (PLAVIX) 75 MG tablet TAKE 1 TABLET BY MOUTH DAILY 15 tablet 0   cyclobenzaprine (FLEXERIL) 10 MG tablet Take 10 mg by mouth 3 (three) times daily as needed for muscle spasms.     doxycycline (VIBRA-TABS) 100 MG tablet Take 1 tablet (100 mg total) by mouth 2 (two) times daily. 30 tablet 0   famotidine (PEPCID) 40 MG tablet Take 40 mg by mouth at bedtime.     fluticasone (FLONASE) 50 MCG/ACT nasal spray Place 1 spray into both nostrils daily. (Patient taking differently: Place 1 spray into both nostrils daily as needed for allergies.) 18.2 mL 2   furosemide (LASIX) 20 MG tablet TAKE 1 TABLET BY MOUTH DAILY 15 tablet 0   guaiFENesin (MUCINEX) 600 MG 12 hr tablet Take 1 tablet (600 mg total) by mouth 2 (two) times daily. (Patient not taking: Reported on 12/09/2021) 60 tablet 3   isosorbide mononitrate (IMDUR) 60 MG 24 hr tablet Take 1 tablet (60 mg total) by mouth 2 (two) times daily. 30 tablet 0   lisinopril (ZESTRIL) 10 MG tablet TAKE 1 TABLET BY MOUTH DAILY 15 tablet 0   loratadine (CLARITIN)  10 MG tablet Take 1 tablet (10 mg total) by mouth daily. (Patient taking differently: Take 10 mg by mouth daily as needed for allergies.) 30 tablet 11   metoprolol tartrate (LOPRESSOR) 25 MG tablet TAKE 1 TABLET BY MOUTH TWICE DAILY 180 tablet 3   nitroGLYCERIN (NITROSTAT) 0.4 MG SL tablet Place 1 tablet (0.4 mg total) under the tongue every 5 (five) minutes as needed for chest pain (X3 DOSES MAX). 25 tablet 11   oxyCODONE-acetaminophen (PERCOCET) 7.5-325 MG tablet Take 1-2 tablets by mouth every 4 (four) hours as needed for moderate pain (back pain).     pregabalin (LYRICA) 50 MG capsule  Take 50 mg by mouth at bedtime as needed (leg pain).     ranolazine (RANEXA) 500 MG 12 hr tablet TAKE 1 TABLET BY MOUTH TWICE  DAILY 30 tablet 0   Tiotropium Bromide Monohydrate (SPIRIVA RESPIMAT) 1.25 MCG/ACT AERS Inhale 2 puffs into the lungs daily. (Patient taking differently: Inhale 2 puffs into the lungs daily as needed (sob).) 4 g 0   valsartan (DIOVAN) 160 MG tablet Take 1 tablet (160 mg total) by mouth daily. (Patient not taking: Reported on 12/09/2021) 30 tablet 5   No current facility-administered medications for this visit.    SURGICAL HISTORY:  Past Surgical History:  Procedure Laterality Date   CARDIAC DEFIBRILLATOR PLACEMENT  03/2006   [employer]. remote-yes.    CORONARY ARTERY BYPASS GRAFT     x4   IMPLANTABLE CARDIOVERTER DEFIBRILLATOR GENERATOR CHANGE N/A 03/03/2013   Procedure: IMPLANTABLE CARDIOVERTER DEFIBRILLATOR GENERATOR CHANGE;  Surgeon: Deboraha Sprang, MD;  Location: [facility] CATH LAB;  Service: Cardiovascular;  Laterality: N/A;   stent implant     x1. possibly x2.     REVIEW OF SYSTEMS:  Constitutional: positive for fatigue Eyes: negative Ears, nose, mouth, throat, and face: negative Respiratory: positive for pleurisy/chest pain Cardiovascular: negative Gastrointestinal: negative Genitourinary:negative Integument/breast: negative Hematologic/lymphatic: negative Musculoskeletal:negative Neurological: negative Behavioral/Psych: negative Endocrine: negative Allergic/Immunologic: negative   PHYSICAL EXAMINATION: General appearance: alert, cooperative, fatigued, and no distress Head: Normocephalic, without obvious abnormality, atraumatic Neck: no adenopathy, no JVD, supple, symmetrical, trachea midline, and thyroid not enlarged, symmetric, no tenderness/mass/nodules Lymph nodes: Cervical, supraclavicular, and axillary nodes normal. Resp: clear to auscultation bilaterally Back: symmetric, no curvature. ROM normal. No CVA tenderness. Cardio: regular rate and rhythm,  S1, S2 normal, no murmur, click, rub or gallop GI: soft, non-tender; bowel sounds normal; no masses,  no organomegaly Extremities: extremities normal, atraumatic, no cyanosis or edema Neurologic: Alert and oriented X 3, normal strength and tone. Normal symmetric reflexes. Normal coordination and gait  ECOG PERFORMANCE STATUS: 1 - Symptomatic but completely ambulatory  Blood pressure 121/63, pulse 95, temperature 98.4 F (36.9 C), temperature source Oral, resp. rate 15, height 5\' 3"  (1.6 m), weight 154 lb 9.6 oz (70.1 kg), SpO2 100 %.  LABORATORY DATA: Lab Results  Component Value Date   WBC 4.7 02/04/2022   HGB 12.9 02/04/2022   HCT 38.1 02/04/2022   MCV 94.1 02/04/2022   PLT 212 02/04/2022      Chemistry      Component Value Date/Time   NA 140 02/04/2022 1426   NA 141 03/06/2021 0858   K 4.2 02/04/2022 1426   CL 104 02/04/2022 1426   CO2 32 02/04/2022 1426   BUN 14 02/04/2022 1426   BUN 15 03/06/2021 0858   CREATININE 1.31 (H) 02/04/2022 1426      Component Value Date/Time   CALCIUM 9.5 02/04/2022 1426   ALKPHOS 80  02/04/2022 1426   AST 12 (L) 02/04/2022 1426   ALT 11 02/04/2022 1426   BILITOT 0.4 02/04/2022 1426       RADIOGRAPHIC STUDIES: NM PET Image Restage (PS) Skull Base to Thigh (F-18 FDG)  Result Date: 02/26/2022 CLINICAL DATA:  Subsequent treatment strategy for non-small cell lung cancer. EXAM: NUCLEAR MEDICINE PET SKULL BASE TO THIGH TECHNIQUE: 7.58 mCi F-18 FDG was injected intravenously. Full-ring PET imaging was performed from the skull base to thigh after the radiotracer. CT data was obtained and used for attenuation correction and anatomic localization. Fasting blood glucose: 114 mg/dl COMPARISON:  CT chest 02/04/2022 and PET-CT from 12/08/2020 FINDINGS: Mediastinal blood pool activity: SUV max 3.18 Liver activity: SUV max NA NECK: No hypermetabolic lymph nodes in the neck. Incidental CT findings: None. CHEST: No tracer avid axillary, supraclavicular,  mediastinal, or hilar lymph nodes. In there is a non solid nodule within the posterior right upper lobe measuring 1.8 cm with SUV max of 3.0, image 26/7. On the previous PET-CT from 12/08/20 this measured 1.5 cm with SUV max of 1.79. 10 mm subpleural nodule within the posteromedial right lung base measures 1 cm with SUV max of 1.26, image 48/7. On the lung cancer screening CT from 09/14/2020 this nodule measured 0.8 cm. Corresponding to the treated tumor within the superior segment of the left lower lobe is an ill-defined area of ground-glass masslike architectural distortion measuring 2.3 x 1.9 cm with SUV max of 3.88, image 22/7. There is associated adjacent pleural thickening with increased uptake as well as increased uptake in the left posterior chest wall. Imaging findings are compatible with post treatment changes. Incidental CT findings: Median sternotomy and CABG procedure. Left chest wall ICD with lead in the right ventricle. Aortic atherosclerosis. Mild cardiac enlargement without pericardial effusion. ABDOMEN/PELVIS: No abnormal hypermetabolic activity within the liver, pancreas, adrenal glands, or spleen. No hypermetabolic lymph nodes in the abdomen or pelvis. Incidental CT findings: Status post cholecystectomy. Aortic atherosclerotic calcifications. Sigmoid diverticulosis without acute diverticulitis. SKELETON: No focal hypermetabolic activity to suggest skeletal metastasis. Incidental CT findings: None. IMPRESSION: 1. Corresponding to the treated lesion within the superior segment of the left lower lobe is an area of ground-glass and masslike architectural distortion with mildly increased FDG uptake. Imaging findings are compatible with changes secondary to external beam radiation. The underlying lung lesion is indistinguishable from these changes. 2. Persistent non-solid nodule within the posterior right upper lobe has an SUV max of 3.0. On the previous PET-CT this was slightly smaller within SUV max  of 1.79. Cannot exclude underlying low-grade indolent neoplasm such as pulmonary adenocarcinoma. 3. Stable subpleural nodule within the posteromedial right lung base without appreciable FDG uptake. Continued attention to this nodule on future surveillance imaging is advised. 4. No signs of tracer avid nodal metastasis or distant metastatic disease. 5.  Aortic Atherosclerosis (ICD10-I70.0). Electronically Signed   By: Kerby Moors M.D.   On: 02/26/2022 08:26   CUP PACEART REMOTE DEVICE CHECK  Result Date: 02/25/2022 Scheduled remote reviewed. Normal device function.  Next remote 91 days. LA  CT Chest W Contrast  Result Date: 02/06/2022 CLINICAL DATA:  Non-small-cell lung cancer. Restaging. * Tracking Code: BO * EXAM: CT CHEST WITH CONTRAST TECHNIQUE: Multidetector CT imaging of the chest was performed during intravenous contrast administration. RADIATION DOSE REDUCTION: This exam was performed according to the departmental dose-optimization program which includes automated exposure control, adjustment of the mA and/or kV according to patient size and/or use of iterative reconstruction technique. CONTRAST:  53mL OMNIPAQUE IOHEXOL 300 MG/ML  SOLN COMPARISON:  Chest CT 12/09/2021.  Chest CT 12/05/2021. FINDINGS: Cardiovascular: The heart size is normal. No substantial pericardial effusion. Coronary artery calcification is evident. Status post CABG. Moderate atherosclerotic calcification is noted in the wall of the thoracic aorta. Mediastinum/Nodes: No mediastinal lymphadenopathy. There is no hilar lymphadenopathy. The esophagus has normal imaging features. There is no axillary lymphadenopathy. Lungs/Pleura: Previously identified nodule in the superior segment left lower lobe is similar measuring 2.2 x 2.0 cm today compared to 2.3 x 2.1 cm previously. 1.9 cm ground-glass opacity in the posterior right lower lobe with 1.8 cm previously. 10 mm right lower lobe nodule on 112/7 is stable. No new suspicious  pulmonary nodule or mass. No focal airspace consolidation. There is no evidence of pleural effusion. Upper Abdomen: Tiny hypoattenuating lesions in the right liver stable, too small to characterize but most likely benign. No followup recommended. Musculoskeletal: No worrisome lytic or sclerotic osseous abnormality. IMPRESSION: 1. Stable exam. No new or progressive findings. 2. Stable bilateral pulmonary nodules. Dominant superior segment left lower lobe pulmonary nodule is stable since CT of 12/06/2021. The sub solid/ground-glass nodule in the posterior right lower lobe is also not substantially changed. 3. Aortic Atherosclerosis (ICD10-I70.0). Electronically Signed   By: Misty Stanley M.D.   On: 02/06/2022 08:32    ASSESSMENT AND PLAN: This is a very pleasant 76 years old African-American female with Stage IB (T1b, N0, M0) non-small cell lung cancer, adenocarcinoma presented with left lower lobe lung nodule diagnosed in September 2022.  She is status post SBRT under the care of Dr. Sondra Come. Last treatment on 02/15/21.  Unfortunately her last CT scan showed bilateral pulmonary opacities suspicious for low-grade adenocarcinoma but inflammatory process could not be completely ruled out especially with her aspiration issues. Her recent CT scan of the chest in October 2023 showed stable disease with stable bilateral pulmonary nodules that did not resolve in the interval but has been stable since the December 06, 2021 scan. I ordered a PET scan which was performed recently and it showed mildly increased FDG uptake corresponding to the treated lesion within the superior segment of the left lower lobe.  These are secondary to radiation changes.  She also had persistent nonsolid nodule within the posterior right upper lobe with SUV of 3.0 that slightly larger and more hypermetabolic than the previous imaging studies and low-grade malignancy could not be excluded. I discussed the PET scan results with the patient and  recommended for her to continue on observation with close monitoring of these pulmonary nodules. I will order a CT scan of the chest in around 4 months for further evaluation and if there is any further progression, we will consider the patient for bronchoscopy and biopsy of the right upper lobe lung nodule. The patient was advised to call immediately if she has any other concerning symptoms in the interval. The patient voices understanding of current disease status and treatment options and is in agreement with the current care plan.  All questions were answered. The patient knows to call the clinic with any problems, questions or concerns. We can certainly see the patient much sooner if necessary. The total time spent in the appointment was 30 minutes.  Disclaimer: This note was dictated with voice recognition software. Similar sounding words can inadvertently be transcribed and may not be corrected upon review.

## 2022-03-03 ENCOUNTER — Other Ambulatory Visit: Payer: Self-pay | Admitting: Interventional Cardiology

## 2022-03-06 NOTE — Progress Notes (Signed)
Cardiology Office Note   Date:  03/07/2022   ID:  Karen [name], DOB Jul 09, 1945, MRN [id number]  PCP:  [facility]    No chief complaint on file.  CAD  Wt Readings from Last 3 Encounters:  03/07/22 152 lb 9.6 oz (69.2 kg)  02/28/22 154 lb 9.6 oz (70.1 kg)  02/06/22 152 lb 9 oz (69.2 kg)       History of Present Illness: Karen [name] is a 76 y.o. female  who has had a complicated cardiac history. She had a cardiac arrest in the early 1998/07/08. At that time, she had an AICD placed in [place] by Dr. Chuck Hint. In 07-07-2005, she had another cardiac arrest at the time of hospitalization. Cardiac cath showed an occluded left main which was angioplastied. She then went to emergency CABG. she recovered from that episode.    Since that time, she has had chronic chest discomfort. His lead to a repeat cardiac cath in 07/08/06. It showed that her grafts were closed. Her native left main had opened. She did have some other branch vessel disease. She has been managed medically. SHe has had some chest pain since stopping the Ranexa which was no longer covered by insurance, but improved with Imdur.  Last stress test was in July 2015 and showed no evidence of ischemia with normal LV function.   Husband passed away in Jul 07, 2017.   She has chronic back pain.    Diagnosed with lung cancer in 2022-followed by Dr. Earlie Server in oncology: "Stage IB (T1b, N0, M0) non-small cell lung cancer, adenocarcinoma presented with left lower lobe lung nodule diagnosed in September 2022.   PRIOR THERAPY: SBRT under the care of Dr. Sondra Come. Last treatment on 02/15/21.    CURRENT THERAPY: Observation "  Has had GERD sx relieved with Pepcid. Reports some DOE.   Past Medical History:  Diagnosis Date   Acute myocardial infarction, unspecified site, episode of care unspecified    Adenocarcinoma, lung ([place]) 02/16/2021   Allergic rhinitis, cause unspecified    CAD (coronary artery disease)    GERD (gastroesophageal  reflux disease)    Headache(784.0)    History of radiation therapy    left lung SBRT 02/08/2021, 02/13/2021, 02/15/2021  Dr Gery Pray   HTN (hypertension)    Other and unspecified hyperlipidemia    Other diseases of lung, not elsewhere classified    Postsurgical aortocoronary bypass status    hx of it.     Past Surgical History:  Procedure Laterality Date   CARDIAC DEFIBRILLATOR PLACEMENT  03/2006   [employer]. remote-yes.    CORONARY ARTERY BYPASS GRAFT     x4   IMPLANTABLE CARDIOVERTER DEFIBRILLATOR GENERATOR CHANGE N/A 03/03/2013   Procedure: IMPLANTABLE CARDIOVERTER DEFIBRILLATOR GENERATOR CHANGE;  Surgeon: Deboraha Sprang, MD;  Location: [facility] CATH LAB;  Service: Cardiovascular;  Laterality: N/A;   stent implant     x1. possibly x2.      Current Outpatient Medications  Medication Sig Dispense Refill   acetaminophen (TYLENOL) 500 MG tablet Take 500 mg by mouth every 6 (six) hours as needed for headache.     albuterol (VENTOLIN HFA) 108 (90 Base) MCG/ACT inhaler Inhale 2 puffs into the lungs every 6 (six) hours as needed for wheezing or shortness of breath. 54 g 3   amLODipine (NORVASC) 10 MG tablet TAKE 1 TABLET BY MOUTH DAILY 15 tablet 0   atorvastatin (LIPITOR) 10 MG tablet TAKE 1 TABLET BY MOUTH DAILY 15 tablet 0  clopidogrel (PLAVIX) 75 MG tablet TAKE 1 TABLET BY MOUTH DAILY 15 tablet 0   cyclobenzaprine (FLEXERIL) 10 MG tablet Take 10 mg by mouth 3 (three) times daily as needed for muscle spasms.     famotidine (PEPCID) 40 MG tablet Take 40 mg by mouth at bedtime.     fluticasone (FLONASE) 50 MCG/ACT nasal spray Place 1 spray into both nostrils daily. (Patient taking differently: Place 1 spray into both nostrils daily as needed for allergies.) 18.2 mL 2   furosemide (LASIX) 20 MG tablet TAKE 1 TABLET BY MOUTH DAILY 15 tablet 0   guaiFENesin (MUCINEX) 600 MG 12 hr tablet Take 1 tablet (600 mg total) by mouth 2 (two) times daily. 60 tablet 3   isosorbide mononitrate (IMDUR) 60  MG 24 hr tablet Take 1 tablet (60 mg total) by mouth 2 (two) times daily. 30 tablet 0   lisinopril (ZESTRIL) 10 MG tablet TAKE 1 TABLET BY MOUTH DAILY 15 tablet 0   loratadine (CLARITIN) 10 MG tablet Take 1 tablet (10 mg total) by mouth daily. (Patient taking differently: Take 10 mg by mouth daily as needed for allergies.) 30 tablet 11   naloxone (NARCAN) nasal spray 4 mg/0.1 mL SMARTSIG:Both Nares     nitroGLYCERIN (NITROSTAT) 0.4 MG SL tablet Place 1 tablet (0.4 mg total) under the tongue every 5 (five) minutes as needed for chest pain (X3 DOSES MAX). 25 tablet 11   oxyCODONE-acetaminophen (PERCOCET) 7.5-325 MG tablet Take 1-2 tablets by mouth every 4 (four) hours as needed for moderate pain (back pain).     pregabalin (LYRICA) 50 MG capsule Take 50 mg by mouth at bedtime as needed (leg pain).     ranolazine (RANEXA) 500 MG 12 hr tablet TAKE 1 TABLET BY MOUTH TWICE  DAILY 30 tablet 0   Tiotropium Bromide Monohydrate (SPIRIVA RESPIMAT) 1.25 MCG/ACT AERS Inhale 2 puffs into the lungs daily. (Patient taking differently: Inhale 2 puffs into the lungs daily as needed (sob).) 4 g 0   doxycycline (VIBRA-TABS) 100 MG tablet Take 1 tablet (100 mg total) by mouth 2 (two) times daily. (Patient not taking: Reported on 03/07/2022) 30 tablet 0   Influenza Vac High-Dose Quad (FLUZONE HIGH-DOSE QUADRIVALENT) 0.7 ML [facility] ADMINISTERED     metoprolol tartrate (LOPRESSOR) 25 MG tablet TAKE 1 TABLET BY MOUTH TWICE DAILY (Patient not taking: Reported on 03/07/2022) 180 tablet 3   valsartan (DIOVAN) 160 MG tablet Take 1 tablet (160 mg total) by mouth daily. (Patient not taking: Reported on 12/09/2021) 30 tablet 5   No current facility-administered medications for this visit.    Allergies:   Codeine and Percocet [oxycodone-acetaminophen]    Social History:  The patient  reports that she has been smoking cigarettes. She started smoking about 55 years ago. She has a 27.00 pack-year smoking history. She has never  used smokeless tobacco. She reports that she does not drink alcohol and does not use drugs.   Family History:  The patient's family history includes Breast cancer in her maternal aunt; Heart attack in her mother; Heart disease in her mother.    ROS:  Please see the history of present illness.   Otherwise, review of systems are positive for unsteadiness; using a cane since Oct 2022.   All other systems are reviewed and negative.    PHYSICAL EXAM: VS:  BP (!) 102/52   Ht 5' 3.5" (1.613 m)   Wt 152 lb 9.6 oz (69.2 kg)   BMI 26.61 kg/m  ,  BMI Body mass index is 26.61 kg/m. GEN: Well nourished, well developed, in no acute distress HEENT: normal Neck: no JVD, carotid bruits, or masses Cardiac: RRR; no murmurs, rubs, or gallops,no edema  Respiratory:  clear to auscultation bilaterally, normal work of breathing GI: soft, nontender, nondistended, + BS MS: no deformity or atrophy Skin: warm and dry, no rash Neuro:  Strength and sensation are intact Psych: euthymic mood, full affect    Recent Labs: 02/04/2022: ALT 11; BUN 14; Creatinine 1.31; Hemoglobin 12.9; Platelet Count 212; Potassium 4.2; Sodium 140   Lipid Panel    Component Value Date/Time   CHOL 181 02/16/2021 0935   CHOL 165 08/16/2019 0959   TRIG 73 02/16/2021 0935   HDL 82 02/16/2021 0935   HDL 74 08/16/2019 0959   CHOLHDL 2.2 02/16/2021 0935   VLDL 15 02/16/2021 0935   LDLCALC 84 02/16/2021 0935   LDLCALC 80 08/16/2019 0959   LDLDIRECT 82 01/15/2018 1536     Other studies Reviewed: Additional studies/ records that were reviewed today with results demonstrating: labs reviewed.   ASSESSMENT AND PLAN:  CAD/old MI: No clear angina. Check echo given DOE.  Prior CABG.  Has epigastric pain relieved with H2 blocker. AICD: Followed by EP.  History of VF arrest. Hyperlipidemia: LDL 84 in 2022.  Only on low dose atorvastatin due to leg pains. Stop atorvastatin and start rosuvastatin 20 mg daily.  Liver and lipids in 3  months.  Pain is more from night time cramps so I think it is less likely from the statin. Aortic atherosclerosis: Aortic ectasia noted as well.  25 mm dilatation mention on prior study. Smoking: Trying to quit. Wants to try Buproprion.  Will check with PharmD to see if there are interactions.  Smokes due to stress from caring for son with mental health issues.    Current medicines are reviewed at length with the patient today.  The patient concerns regarding her medicines were addressed.  The following changes have been made:  as above  Labs/ tests ordered today include: as above No orders of the defined types were placed in this encounter.   Recommend 150 minutes/week of aerobic exercise Low fat, low carb, high fiber diet recommended  Disposition:   FU in 6 months   Signed, Larae Grooms, MD  03/07/2022 2:37 PM    [address], [place], [place]  [address] Phone: [phone]; Fax: [phone]

## 2022-03-07 ENCOUNTER — Encounter: Payer: Self-pay | Admitting: Interventional Cardiology

## 2022-03-07 ENCOUNTER — Ambulatory Visit: Payer: Medicare Other | Attending: Interventional Cardiology | Admitting: Interventional Cardiology

## 2022-03-07 VITALS — BP 102/52 | Ht 63.5 in | Wt 152.6 lb

## 2022-03-07 DIAGNOSIS — I25708 Atherosclerosis of coronary artery bypass graft(s), unspecified, with other forms of angina pectoris: Secondary | ICD-10-CM

## 2022-03-07 DIAGNOSIS — I252 Old myocardial infarction: Secondary | ICD-10-CM

## 2022-03-07 DIAGNOSIS — Z95810 Presence of automatic (implantable) cardiac defibrillator: Secondary | ICD-10-CM | POA: Diagnosis not present

## 2022-03-07 DIAGNOSIS — E785 Hyperlipidemia, unspecified: Secondary | ICD-10-CM | POA: Diagnosis not present

## 2022-03-07 DIAGNOSIS — R0609 Other forms of dyspnea: Secondary | ICD-10-CM

## 2022-03-07 MED ORDER — ROSUVASTATIN CALCIUM 20 MG PO TABS: 20.0000 mg | ORAL_TABLET | Freq: Every day | ORAL | 3 refills | Status: DC

## 2022-03-07 NOTE — Patient Instructions (Signed)
Medication Instructions:  Your physician has recommended you make the following change in your medication:   1) STOP atorvastatin 2) START rosuvastatin (Crestor) 20mg  daily  *If you need a refill on your cardiac medications before your next appointment, please call your pharmacy*  Lab Work: In 3 months: fasting Liver and Lipid panel If you have labs (blood work) drawn today and your tests are completely normal, you will receive your results only by: [place] (if you have MyChart) OR A paper copy in the mail If you have any lab test that is abnormal or we need to change your treatment, we will call you to review the results.  Testing/Procedures: Your physician has requested that you have an echocardiogram. Echocardiography is a painless test that uses sound waves to create images of your heart. It provides your doctor with information about the size and shape of your heart and how well your heart's chambers and valves are working. This procedure takes approximately one hour. There are no restrictions for this procedure. Please do NOT wear cologne, perfume, aftershave, or lotions (deodorant is allowed). Please arrive 15 minutes prior to your appointment time.  Follow-Up: At [facility], you and your health needs are our priority.  As part of our continuing mission to provide you with exceptional heart care, we have created designated Provider Care Teams.  These Care Teams include your primary Cardiologist (physician) and Advanced Practice Providers (APPs -  Physician Assistants and Nurse Practitioners) who all work together to provide you with the care you need, when you need it.  Your next appointment:   6 month(s)  The format for your next appointment:   In Person  Provider:   Larae Grooms, MD   Important Information About Sugar

## 2022-03-11 ENCOUNTER — Telehealth: Payer: Self-pay | Admitting: *Deleted

## 2022-03-11 MED ORDER — BUPROPION HCL ER (XL) 150 MG PO TB24: ORAL_TABLET | ORAL | 4 refills | Status: DC

## 2022-03-11 NOTE — Telephone Encounter (Signed)
-----   Message from Jettie Booze, MD sent at 03/09/2022  8:49 PM EST ----- She can use the bupropion XL and do 150mg  daily for 3 days then increase to 300mg  daily.  Please see instructions below.  JV  ----- Message ----- From: Ramond Dial, RPH-CPP Sent: 03/08/2022   3:12 PM EST To: Jettie Booze, MD  Buproprion SR 150mg  once daily for 3 days then increase to 150mg  BID. Or you can use the XL and do 150mg  daily for 3 days then increase to 300mg  daily. No CI seen. Start 2 weeks prior to quit date ----- Message ----- From: Jettie Booze, MD Sent: 03/07/2022   3:34 PM EST To: Ramond Dial, RPH-CPP  Any contraindications of bupropion for smoking cessation for this patient?  If not, what would be the starting dose.

## 2022-03-11 NOTE — Telephone Encounter (Signed)
I spoke with patient and gave her information from Dr Irish Lack and pharmacist regarding bupropion XL.  Prescription sent to [facility] on [address].

## 2022-03-25 ENCOUNTER — Other Ambulatory Visit: Payer: Self-pay | Admitting: Interventional Cardiology

## 2022-03-28 ENCOUNTER — Encounter: Payer: Self-pay | Admitting: Emergency Medicine

## 2022-03-28 ENCOUNTER — Ambulatory Visit (INDEPENDENT_AMBULATORY_CARE_PROVIDER_SITE_OTHER): Payer: Medicare Other | Admitting: Emergency Medicine

## 2022-03-28 VITALS — BP 120/68 | HR 75 | Temp 98.1°F | Ht 63.5 in | Wt 153.0 lb

## 2022-03-28 DIAGNOSIS — C3492 Malignant neoplasm of unspecified part of left bronchus or lung: Secondary | ICD-10-CM | POA: Diagnosis not present

## 2022-03-28 DIAGNOSIS — K219 Gastro-esophageal reflux disease without esophagitis: Secondary | ICD-10-CM

## 2022-03-28 DIAGNOSIS — J449 Chronic obstructive pulmonary disease, unspecified: Secondary | ICD-10-CM

## 2022-03-28 DIAGNOSIS — R053 Chronic cough: Secondary | ICD-10-CM

## 2022-03-28 MED ORDER — LOSARTAN POTASSIUM 50 MG PO TABS: 50.0000 mg | ORAL_TABLET | Freq: Every day | ORAL | 2 refills | Status: DC

## 2022-03-28 MED ORDER — FAMOTIDINE 40 MG PO TABS: 40.0000 mg | ORAL_TABLET | Freq: Every day | ORAL | 2 refills | Status: DC

## 2022-03-28 MED ORDER — STIOLTO RESPIMAT 2.5-2.5 MCG/ACT IN AERS: 2.0000 | INHALATION_SPRAY | Freq: Every day | RESPIRATORY_TRACT | 2 refills | Status: DC

## 2022-03-28 NOTE — Assessment & Plan Note (Signed)
We will try stopping your Spiriva. We will send a prescription to your pharmacy for [place].  Take 2 puffs once daily.  This will substitute for your Spiriva.  Keep track of whether you feel it helps your breathing more.  If so we will continue it going forward if not we can go back to the Spiriva Keep your albuterol available to use 2 puffs if needed for shortness of breath, chest tightness, wheezing. Follow with Dr Lamonte Sakai in 3 months or sooner if you have any problems.

## 2022-03-28 NOTE — Assessment & Plan Note (Signed)
With persistent pulmonary nodules, no significant hypermetabolism but remain suspicious for possible adenocarcinoma.  She is getting serial CT scans with Dr. Julien Nordmann.  If she requires biopsy then we can help facilitate.

## 2022-03-28 NOTE — Progress Notes (Signed)
Subjective:    Patient ID: Karen [name], female    DOB: 02-26-1946, 76 y.o.   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  She has persistent nodular disease that we have been following with serial imaging.  I seen her for her COPD which has been managed with Spiriva.  She has a history of chronic cough in the setting of GERD.  I changed her lisinopril to valsartan at our last visit in May - she did not change due to cost.  She reports that she continues to cough daily. She has exertional SOB with walking, housework. She uses albuterol about 3x a week. She is producing phlegm, white, no blood.   CT scan of the chest 02/04/2022 reviewed by me showed stable bilateral pulmonary nodules, 2.2 x 2.0 cm in the left lower lobe, 1.9 cm groundglass opacity posterior right lower lobe, 10 mm right lower lobe nodule.  PET scan performed 02/22/2022 reviewed by me showed no significant adenopathy, mild increased FDG uptake in the treated left lower lobe superior segmental nodule, no significant hypermetabolism in the other pulmonary nodules.  Review of Systems As per HPI  Past Medical History:  Diagnosis Date   Acute myocardial infarction, unspecified site, episode of care unspecified    Adenocarcinoma, lung ([place]) 02/16/2021   Allergic rhinitis, cause unspecified    CAD (coronary artery disease)    GERD (gastroesophageal reflux disease)    Headache(784.0)    History of radiation therapy    left lung SBRT 02/08/2021, 02/13/2021, 02/15/2021  Dr Gery Pray   HTN (hypertension)    Other and unspecified hyperlipidemia    Other diseases of lung, not elsewhere classified    Postsurgical aortocoronary bypass status    hx of it.      Family History  Problem Relation Age of Onset   Heart attack Mother    Heart disease Mother    Breast cancer Maternal Aunt      Social  History   Socioeconomic History   Marital status: Legally Separated    Spouse name: Not on file   Number of children: Not on file   Years of education: Not on file   Highest education level: Not on file  Occupational History   Not on file  Tobacco Use   Smoking status: Some Days    Packs/day: 0.50    Years: 54.00    Total pack years: 27.00    Types: Cigarettes    Start date: 1968   Smokeless tobacco: Never   Tobacco comments:    2 cigarettes smoked daily. 03/28/22 ARJ   Vaping Use   Vaping Use: Never used  Substance and Sexual Activity   Alcohol use: No   Drug use: No   Sexual activity: Not on file  Other Topics Concern   Not on file  Social History Narrative   Married, 4 children.    Social Determinants of Health   Financial Resource Strain: Not on file  Food Insecurity: Not on file  Transportation Needs: Not on file  Physical Activity: Not on file  Stress: Not on file  Social Connections: Not on file  Intimate Partner Violence: Not on file    No hx TB exposure Has not lived in the [place] or [place] in [place] and [place]   Allergies  Allergen Reactions   Codeine Itching and Swelling  Percocet [Oxycodone-Acetaminophen] Hives and Itching     Outpatient Medications Prior to Visit  Medication Sig Dispense Refill   acetaminophen (TYLENOL) 500 MG tablet Take 500 mg by mouth every 6 (six) hours as needed for headache.     albuterol (VENTOLIN HFA) 108 (90 Base) MCG/ACT inhaler Inhale 2 puffs into the lungs every 6 (six) hours as needed for wheezing or shortness of breath. 54 g 3   amLODipine (NORVASC) 10 MG tablet TAKE 1 TABLET BY MOUTH DAILY 15 tablet 0   buPROPion (WELLBUTRIN XL) 150 MG 24 hr tablet Take one tablet by mouth daily for 3 days and then increase to 2 tablets by mouth daily 63 tablet 4   clopidogrel (PLAVIX) 75 MG tablet TAKE 1 TABLET BY MOUTH DAILY 15 tablet 0   cyclobenzaprine (FLEXERIL) 10 MG tablet Take 10 mg by mouth 3 (three) times daily as needed for  muscle spasms.     doxycycline (VIBRA-TABS) 100 MG tablet Take 1 tablet (100 mg total) by mouth 2 (two) times daily. 30 tablet 0   famotidine (PEPCID) 40 MG tablet Take 40 mg by mouth at bedtime.     fluticasone (FLONASE) 50 MCG/ACT nasal spray Place 1 spray into both nostrils daily. (Patient taking differently: Place 1 spray into both nostrils daily as needed for allergies.) 18.2 mL 2   furosemide (LASIX) 20 MG tablet TAKE 1 TABLET BY MOUTH DAILY 15 tablet 0   guaiFENesin (MUCINEX) 600 MG 12 hr tablet Take 1 tablet (600 mg total) by mouth 2 (two) times daily. 60 tablet 3   Influenza Vac High-Dose Quad (FLUZONE HIGH-DOSE QUADRIVALENT) 0.7 ML [facility] ADMINISTERED     isosorbide mononitrate (IMDUR) 60 MG 24 hr tablet Take 1 tablet (60 mg total) by mouth 2 (two) times daily. 30 tablet 0   lisinopril (ZESTRIL) 10 MG tablet TAKE 1 TABLET BY MOUTH DAILY 15 tablet 0   loratadine (CLARITIN) 10 MG tablet Take 1 tablet (10 mg total) by mouth daily. (Patient taking differently: Take 10 mg by mouth daily as needed for allergies.) 30 tablet 11   metoprolol tartrate (LOPRESSOR) 25 MG tablet TAKE 1 TABLET BY MOUTH TWICE DAILY 180 tablet 3   naloxone (NARCAN) nasal spray 4 mg/0.1 mL SMARTSIG:Both Nares     nitroGLYCERIN (NITROSTAT) 0.4 MG SL tablet Place 1 tablet (0.4 mg total) under the tongue every 5 (five) minutes as needed for chest pain (X3 DOSES MAX). 25 tablet 11   oxyCODONE-acetaminophen (PERCOCET) 7.5-325 MG tablet Take 1-2 tablets by mouth every 4 (four) hours as needed for moderate pain (back pain).     pregabalin (LYRICA) 50 MG capsule Take 50 mg by mouth at bedtime as needed (leg pain).     ranolazine (RANEXA) 500 MG 12 hr tablet TAKE 1 TABLET BY MOUTH TWICE  DAILY 30 tablet 0   rosuvastatin (CRESTOR) 20 MG tablet Take 1 tablet (20 mg total) by mouth daily. 90 tablet 3   Tiotropium Bromide Monohydrate (SPIRIVA RESPIMAT) 1.25 MCG/ACT AERS Inhale 2 puffs into the lungs daily. (Patient taking  differently: Inhale 2 puffs into the lungs daily as needed (sob).) 4 g 0   valsartan (DIOVAN) 160 MG tablet Take 1 tablet (160 mg total) by mouth daily. 30 tablet 5   No facility-administered medications prior to visit.          Objective:   Physical Exam  Vitals:   03/28/22 1107  BP: 120/68  Pulse: 75  Temp: 98.1 F (36.7 C)  TempSrc:  Oral  SpO2: 98%  Weight: 153 lb (69.4 kg)  Height: 5' 3.5" (1.613 m)   Gen: Pleasant, well-nourished, in no distress,  normal affect  ENT: No lesions,  mouth clear, dentures, oropharynx clear, no postnasal drip  Neck: No JVD, no stridor  Lungs: No use of accessory muscles, no crackles or wheezing on normal respiration, no wheeze  Cardiovascular: RRR, heart sounds normal, no murmur or gallops, no peripheral edema  Musculoskeletal: No deformities, no cyanosis or clubbing  Neuro: alert, awake, non focal  Skin: Warm, no lesions or rash     Assessment & Plan:  Stage 2 moderate COPD by GOLD classification ([place]) We will try stopping your Spiriva. We will send a prescription to your pharmacy for [place].  Take 2 puffs once daily.  This will substitute for your Spiriva.  Keep track of whether you feel it helps your breathing more.  If so we will continue it going forward if not we can go back to the Spiriva Keep your albuterol available to use 2 puffs if needed for shortness of breath, chest tightness, wheezing. Follow with Dr Lamonte Sakai in 3 months or sooner if you have any problems.  Adenocarcinoma, lung ([place]) With persistent pulmonary nodules, no significant hypermetabolism but remain suspicious for possible adenocarcinoma.  She is getting serial CT scans with Dr. Julien Nordmann.  If she requires biopsy then we can help facilitate.  GERD without esophagitis Currently managed only on Pepcid.  Could consider uptitrating or adding a PPI  Chronic cough I tried changing her to valsartan but she said that it was cost prohibitive.  I will try again to  stop the lisinopril, get her on losartan  Baltazar Apo, MD, PhD 03/28/2022, 11:24 AM [facility] [phone] or if no answer before 7:00PM call [phone] For any issues after 7:00PM please call eLink [phone]

## 2022-03-28 NOTE — Patient Instructions (Addendum)
We will try stopping your Spiriva. We will send a prescription to your pharmacy for [place].  Take 2 puffs once daily.  This will substitute for your Spiriva.  Keep track of whether you feel it helps your breathing more.  If so we will continue it going forward if not we can go back to the Spiriva Keep your albuterol available to use 2 puffs if needed for shortness of breath, chest tightness, wheezing. We will try stopping your lisinopril.  Start losartan 50 mg once daily.  Hopefully this change will help your cough. Continue your Pepcid as you have been taking it Get your repeat CT scan of the chest as planned by Dr. Julien Nordmann Follow with Dr Lamonte Sakai in 3 months or sooner if you have any problems.

## 2022-03-28 NOTE — Addendum Note (Signed)
Addended by: Gavin Potters R on: 03/28/2022 11:29 AM   Modules accepted: Orders

## 2022-03-28 NOTE — Assessment & Plan Note (Signed)
I tried changing her to valsartan but she said that it was cost prohibitive.  I will try again to stop the lisinopril, get her on losartan

## 2022-03-28 NOTE — Assessment & Plan Note (Signed)
Currently managed only on Pepcid.  Could consider uptitrating or adding a PPI

## 2022-03-29 ENCOUNTER — Ambulatory Visit (HOSPITAL_COMMUNITY): Payer: Medicare Other | Attending: Interventional Cardiology

## 2022-03-29 DIAGNOSIS — R0609 Other forms of dyspnea: Secondary | ICD-10-CM | POA: Diagnosis present

## 2022-03-29 LAB — ECHOCARDIOGRAM COMPLETE
Area-P 1/2: 4.31 cm2
S' Lateral: 2.3 cm

## 2022-03-30 NOTE — Progress Notes (Signed)
Remote ICD transmission.   

## 2022-04-01 ENCOUNTER — Other Ambulatory Visit: Payer: Self-pay | Admitting: Interventional Cardiology

## 2022-04-23 ENCOUNTER — Ambulatory Visit: Payer: Medicare Other | Attending: Internal Medicine | Admitting: Internal Medicine

## 2022-04-23 VITALS — BP 128/62 | HR 66 | Ht 63.0 in | Wt 153.0 lb

## 2022-04-23 DIAGNOSIS — Z95810 Presence of automatic (implantable) cardiac defibrillator: Secondary | ICD-10-CM

## 2022-04-23 DIAGNOSIS — I4901 Ventricular fibrillation: Secondary | ICD-10-CM

## 2022-04-23 NOTE — Patient Instructions (Signed)
Medication Instructions:  The current medical regimen is effective;  continue present plan and medications.  *If you need a refill on your cardiac medications before your next appointment, please call your pharmacy*  Follow-Up: At [facility] you and your health needs are our priority.  As part of our continuing mission to provide you with exceptional heart care, we have created designated Provider Care Teams.  These Care Teams include your primary Cardiologist (physician) and Advanced Practice Providers (APPs -  Physician Assistants and Nurse Practitioners) who all work together to provide you with the care you need, when you need it.  We recommend signing up for the patient portal called "MyChart".  Sign up information is provided on this After Visit Summary.  MyChart is used to connect with patients for Virtual Visits (Telemedicine).  Patients are able to view lab/test results, encounter notes, upcoming appointments, etc.  Non-urgent messages can be sent to your provider as well.   To learn more about what you can do with MyChart, go to [email or web address].    Your next appointment:   1 year(s)  The format for your next appointment:   In Person  Provider:   Dr Virl Axe     Important Information About Sugar

## 2022-04-23 NOTE — Progress Notes (Signed)
Patient Care Team: [facility], [place] as PCP - General Jettie Booze, MD as PCP - Cardiology (Cardiology) Deboraha Sprang, MD as PCP - Electrophysiology (Cardiology)   HPI  Karen [name] is a 76 y.o. female Seen in followup for ICD implantation for primary prevention in the setting of ischemic heart disease.  Initially implanted 2007 with generator replacement in 2014.  This was done  in the context of an EP study >>inducible ventricular arrhythmias.   Subsequently, she presented with acute MI complicated by ventricular fibrillation and underwent left main stenting followed by emergency bypass surgery done by Dr. Faye Ramsay  2012  The patient denies chest pain,, nocturnal dyspnea, orthopnea   There have been no palpitations, lightheadedness or syncope.  Complains of shortness of breath..   Blood pressure at home typically in the 120-130 range; she notes sometimes they are in the 90s.  She continues to care for her disabled son.  Raymond -- Alone.  His kids are uninvolved  History of lung cancer treated with radiation fall 2022    DATE TEST EF   6/15 MYOVIEW    % No ischemia  10/17 Echo   60-65 %   12/23 Echo  50-55%    Date Cr K Hgb  4/21 1.17 4.2 12.9  10/23 1.31 4.2 12.9      Records and Results [place]   Past Medical History:  Diagnosis Date   Acute myocardial infarction, unspecified site, episode of care unspecified    Adenocarcinoma, lung ([place]) 02/16/2021   Allergic rhinitis, cause unspecified    CAD (coronary artery disease)    GERD (gastroesophageal reflux disease)    Headache(784.0)    History of radiation therapy    left lung SBRT 02/08/2021, 02/13/2021, 02/15/2021  Dr Gery Pray   HTN (hypertension)    Other and unspecified hyperlipidemia    Other diseases of lung, not elsewhere classified    Postsurgical aortocoronary bypass status    hx of it.     Past Surgical History:  Procedure Laterality Date   CARDIAC DEFIBRILLATOR PLACEMENT   03/2006   [employer]. remote-yes.    CORONARY ARTERY BYPASS GRAFT     x4   IMPLANTABLE CARDIOVERTER DEFIBRILLATOR GENERATOR CHANGE N/A 03/03/2013   Procedure: IMPLANTABLE CARDIOVERTER DEFIBRILLATOR GENERATOR CHANGE;  Surgeon: Deboraha Sprang, MD;  Location[facility] C[facility] LAB;  Service: Cardiovascular;  Laterality: N/A;   stent implant     x1. possibly x2.     Current Outpatient Medications  Medication Sig Dispense Refill   acetaminophen (TYLENOL) 500 MG tablet Take 500 mg by mouth every 6 (six) hours as needed for headache.     albuterol (VENTOLIN HFA) 108 (90 Base) MCG/ACT inhaler Inhale 2 puffs into the lungs every 6 (six) hours as needed for wheezing or shortness of breath. 54 g 3   clopidogrel (PLAVIX) 75 MG tablet TAKE 1 TABLET BY MOUTH DAILY 15 tablet 0   cyclobenzaprine (FLEXERIL) 10 MG tablet Take 10 mg by mouth 3 (three) times daily as needed for muscle spasms.     doxycycline (VIBRA-TABS) 100 MG tablet Take 1 tablet (100 mg total) by mouth 2 (two) times daily. 30 tablet 0   famotidine (PEPCID) 40 MG tablet Take 1 tablet (40 mg total) by mouth at bedtime. 30 tablet 2   fluticasone (FLONASE) 50 MCG/ACT nasal spray Place 1 spray into both nostrils daily. (Patient taking differently: Place 1 spray into both nostrils daily as needed for allergies.) 18.2 mL  2   furosemide (LASIX) 20 MG tablet TAKE 1 TABLET BY MOUTH DAILY 15 tablet 0   guaiFENesin (MUCINEX) 600 MG 12 hr tablet Take 1 tablet (600 mg total) by mouth 2 (two) times daily. 60 tablet 3   Influenza Vac High-Dose Quad (FLUZONE HIGH-DOSE QUADRIVALENT) 0.7 ML [facility] ADMINISTERED     isosorbide mononitrate (IMDUR) 60 MG 24 hr tablet Take 1 tablet (60 mg total) by mouth 2 (two) times daily. 30 tablet 0   lisinopril (ZESTRIL) 10 MG tablet TAKE 1 TABLET BY MOUTH DAILY 15 tablet 0   loratadine (CLARITIN) 10 MG tablet Take 1 tablet (10 mg total) by mouth daily. (Patient taking differently: Take 10 mg by mouth daily as needed for allergies.)  30 tablet 11   losartan (COZAAR) 50 MG tablet Take 1 tablet (50 mg total) by mouth daily. 30 tablet 2   naloxone (NARCAN) nasal spray 4 mg/0.1 mL SMARTSIG:Both Nares     nitroGLYCERIN (NITROSTAT) 0.4 MG SL tablet Place 1 tablet (0.4 mg total) under the tongue every 5 (five) minutes as needed for chest pain (X3 DOSES MAX). 25 tablet 11   oxyCODONE-acetaminophen (PERCOCET) 7.5-325 MG tablet Take 1-2 tablets by mouth every 4 (four) hours as needed for moderate pain (back pain).     pregabalin (LYRICA) 50 MG capsule Take 50 mg by mouth at bedtime as needed (leg pain).     Tiotropium Bromide-Olodaterol (STIOLTO RESPIMAT) 2.5-2.5 MCG/ACT AERS Inhale 2 puffs into the lungs daily. 4 g 2   amLODipine (NORVASC) 10 MG tablet TAKE 1 TABLET BY MOUTH DAILY (Patient not taking: Reported on 04/23/2022) 15 tablet 0   buPROPion (WELLBUTRIN XL) 150 MG 24 hr tablet Take one tablet by mouth daily for 3 days and then increase to 2 tablets by mouth daily (Patient not taking: Reported on 04/23/2022) 63 tablet 4   metoprolol tartrate (LOPRESSOR) 25 MG tablet TAKE 1 TABLET BY MOUTH TWICE DAILY (Patient not taking: Reported on 04/23/2022) 180 tablet 3   No current facility-administered medications for this visit.    Allergies  Allergen Reactions   Codeine Itching and Swelling   Percocet [Oxycodone-Acetaminophen] Hives and Itching      Review of Systems negative except from HPI and PMH  Physical Exam BP 128/62   Pulse 66   Ht 5\' 3"  (1.6 m)   Wt 153 lb (69.4 kg)   SpO2 94%   BMI 27.10 kg/m  Well developed and well nourished in no acute distress HENT normal Neck supple with JVP-flat Clear Device pocket well healed; without hematoma or erythema.  There is no tethering  Regular rate and rhythm, no murmur Abd-soft with active BS No Clubbing cyanosis   edema Skin-warm and dry A & Oriented  Grossly normal sensory and motor function  ECG sinus 66 16/08/44 IMI   Device function is normal. Programming  changes   See Paceart for details    Assessment and  Plan  Ischemic heart disease with prior CABG and normal LV function   Dyspnea on exertion  Hypertension  VT NS  Implantable defibrillator  [employer]     No interval SVT  Significant dyspnea on exertion but also limited by her back.  Denies chest discomfort.  Saw Dr Saundra Shelling recently  Back pain-- suggested she get a new opinion

## 2022-05-03 ENCOUNTER — Other Ambulatory Visit (HOSPITAL_COMMUNITY): Payer: Self-pay | Admitting: Gastroenterology

## 2022-05-03 DIAGNOSIS — R1319 Other dysphagia: Secondary | ICD-10-CM

## 2022-05-05 ENCOUNTER — Emergency Department (HOSPITAL_COMMUNITY): Payer: 59

## 2022-05-05 ENCOUNTER — Inpatient Hospital Stay (HOSPITAL_COMMUNITY)
Admission: EM | Admit: 2022-05-05 | Discharge: 2022-05-16 | DRG: 356 | Disposition: A | Discharge status: Home Health | Payer: 59 | Attending: Internal Medicine | Admitting: Internal Medicine

## 2022-05-05 ENCOUNTER — Inpatient Hospital Stay (HOSPITAL_COMMUNITY): Payer: 59

## 2022-05-05 ENCOUNTER — Other Ambulatory Visit: Payer: Self-pay

## 2022-05-05 DIAGNOSIS — Z720 Tobacco use: Secondary | ICD-10-CM | POA: Diagnosis present

## 2022-05-05 DIAGNOSIS — Z885 Allergy status to narcotic agent status: Secondary | ICD-10-CM

## 2022-05-05 DIAGNOSIS — R079 Chest pain, unspecified: Secondary | ICD-10-CM | POA: Diagnosis not present

## 2022-05-05 DIAGNOSIS — F17210 Nicotine dependence, cigarettes, uncomplicated: Secondary | ICD-10-CM | POA: Diagnosis present

## 2022-05-05 DIAGNOSIS — I2109 ST elevation (STEMI) myocardial infarction involving other coronary artery of anterior wall: Secondary | ICD-10-CM | POA: Diagnosis not present

## 2022-05-05 DIAGNOSIS — I472 Ventricular tachycardia, unspecified: Secondary | ICD-10-CM | POA: Diagnosis present

## 2022-05-05 DIAGNOSIS — I130 Hypertensive heart and chronic kidney disease with heart failure and stage 1 through stage 4 chronic kidney disease, or unspecified chronic kidney disease: Secondary | ICD-10-CM | POA: Diagnosis present

## 2022-05-05 DIAGNOSIS — Z8674 Personal history of sudden cardiac arrest: Secondary | ICD-10-CM | POA: Diagnosis not present

## 2022-05-05 DIAGNOSIS — I959 Hypotension, unspecified: Secondary | ICD-10-CM | POA: Diagnosis not present

## 2022-05-05 DIAGNOSIS — Z951 Presence of aortocoronary bypass graft: Secondary | ICD-10-CM | POA: Diagnosis not present

## 2022-05-05 DIAGNOSIS — R110 Nausea: Secondary | ICD-10-CM | POA: Diagnosis not present

## 2022-05-05 DIAGNOSIS — E538 Deficiency of other specified B group vitamins: Secondary | ICD-10-CM | POA: Diagnosis present

## 2022-05-05 DIAGNOSIS — I2119 ST elevation (STEMI) myocardial infarction involving other coronary artery of inferior wall: Secondary | ICD-10-CM | POA: Diagnosis not present

## 2022-05-05 DIAGNOSIS — I25119 Atherosclerotic heart disease of native coronary artery with unspecified angina pectoris: Secondary | ICD-10-CM | POA: Diagnosis present

## 2022-05-05 DIAGNOSIS — I5042 Chronic combined systolic (congestive) and diastolic (congestive) heart failure: Secondary | ICD-10-CM | POA: Diagnosis present

## 2022-05-05 DIAGNOSIS — Z1152 Encounter for screening for COVID-19: Secondary | ICD-10-CM

## 2022-05-05 DIAGNOSIS — Z955 Presence of coronary angioplasty implant and graft: Secondary | ICD-10-CM | POA: Diagnosis not present

## 2022-05-05 DIAGNOSIS — K5731 Diverticulosis of large intestine without perforation or abscess with bleeding: Principal | ICD-10-CM | POA: Diagnosis present

## 2022-05-05 DIAGNOSIS — R1032 Left lower quadrant pain: Secondary | ICD-10-CM | POA: Diagnosis present

## 2022-05-05 DIAGNOSIS — R9431 Abnormal electrocardiogram [ECG] [EKG]: Secondary | ICD-10-CM | POA: Diagnosis present

## 2022-05-05 DIAGNOSIS — Z635 Disruption of family by separation and divorce: Secondary | ICD-10-CM

## 2022-05-05 DIAGNOSIS — D649 Anemia, unspecified: Secondary | ICD-10-CM | POA: Diagnosis not present

## 2022-05-05 DIAGNOSIS — I2111 ST elevation (STEMI) myocardial infarction involving right coronary artery: Secondary | ICD-10-CM | POA: Diagnosis present

## 2022-05-05 DIAGNOSIS — Y831 Surgical operation with implant of artificial internal device as the cause of abnormal reaction of the patient, or of later complication, without mention of misadventure at the time of the procedure: Secondary | ICD-10-CM | POA: Diagnosis present

## 2022-05-05 DIAGNOSIS — I10 Essential (primary) hypertension: Secondary | ICD-10-CM | POA: Diagnosis present

## 2022-05-05 DIAGNOSIS — I25810 Atherosclerosis of coronary artery bypass graft(s) without angina pectoris: Secondary | ICD-10-CM | POA: Diagnosis not present

## 2022-05-05 DIAGNOSIS — R197 Diarrhea, unspecified: Secondary | ICD-10-CM

## 2022-05-05 DIAGNOSIS — K922 Gastrointestinal hemorrhage, unspecified: Secondary | ICD-10-CM | POA: Diagnosis not present

## 2022-05-05 DIAGNOSIS — I252 Old myocardial infarction: Secondary | ICD-10-CM

## 2022-05-05 DIAGNOSIS — Z85118 Personal history of other malignant neoplasm of bronchus and lung: Secondary | ICD-10-CM | POA: Diagnosis not present

## 2022-05-05 DIAGNOSIS — Z923 Personal history of irradiation: Secondary | ICD-10-CM | POA: Diagnosis not present

## 2022-05-05 DIAGNOSIS — D62 Acute posthemorrhagic anemia: Secondary | ICD-10-CM | POA: Diagnosis present

## 2022-05-05 DIAGNOSIS — E876 Hypokalemia: Secondary | ICD-10-CM | POA: Diagnosis present

## 2022-05-05 DIAGNOSIS — T82855A Stenosis of coronary artery stent, initial encounter: Secondary | ICD-10-CM | POA: Diagnosis present

## 2022-05-05 DIAGNOSIS — D696 Thrombocytopenia, unspecified: Secondary | ICD-10-CM | POA: Diagnosis present

## 2022-05-05 DIAGNOSIS — E782 Mixed hyperlipidemia: Secondary | ICD-10-CM | POA: Diagnosis present

## 2022-05-05 DIAGNOSIS — J309 Allergic rhinitis, unspecified: Secondary | ICD-10-CM | POA: Diagnosis present

## 2022-05-05 DIAGNOSIS — R1310 Dysphagia, unspecified: Secondary | ICD-10-CM | POA: Diagnosis present

## 2022-05-05 DIAGNOSIS — J449 Chronic obstructive pulmonary disease, unspecified: Secondary | ICD-10-CM | POA: Diagnosis present

## 2022-05-05 DIAGNOSIS — Z79899 Other long term (current) drug therapy: Secondary | ICD-10-CM

## 2022-05-05 DIAGNOSIS — I493 Ventricular premature depolarization: Secondary | ICD-10-CM | POA: Diagnosis not present

## 2022-05-05 DIAGNOSIS — I4901 Ventricular fibrillation: Secondary | ICD-10-CM | POA: Diagnosis present

## 2022-05-05 DIAGNOSIS — G8929 Other chronic pain: Secondary | ICD-10-CM | POA: Diagnosis present

## 2022-05-05 DIAGNOSIS — I25709 Atherosclerosis of coronary artery bypass graft(s), unspecified, with unspecified angina pectoris: Secondary | ICD-10-CM | POA: Diagnosis not present

## 2022-05-05 DIAGNOSIS — I213 ST elevation (STEMI) myocardial infarction of unspecified site: Secondary | ICD-10-CM

## 2022-05-05 DIAGNOSIS — Z95810 Presence of automatic (implantable) cardiac defibrillator: Secondary | ICD-10-CM

## 2022-05-05 DIAGNOSIS — N1832 Chronic kidney disease, stage 3b: Secondary | ICD-10-CM | POA: Diagnosis not present

## 2022-05-05 DIAGNOSIS — Z7902 Long term (current) use of antithrombotics/antiplatelets: Secondary | ICD-10-CM

## 2022-05-05 DIAGNOSIS — Z8249 Family history of ischemic heart disease and other diseases of the circulatory system: Secondary | ICD-10-CM

## 2022-05-05 DIAGNOSIS — I200 Unstable angina: Secondary | ICD-10-CM | POA: Diagnosis not present

## 2022-05-05 DIAGNOSIS — C3490 Malignant neoplasm of unspecified part of unspecified bronchus or lung: Secondary | ICD-10-CM | POA: Diagnosis present

## 2022-05-05 DIAGNOSIS — K219 Gastro-esophageal reflux disease without esophagitis: Secondary | ICD-10-CM | POA: Diagnosis present

## 2022-05-05 DIAGNOSIS — I2510 Atherosclerotic heart disease of native coronary artery without angina pectoris: Secondary | ICD-10-CM | POA: Diagnosis not present

## 2022-05-05 DIAGNOSIS — D7589 Other specified diseases of blood and blood-forming organs: Secondary | ICD-10-CM | POA: Diagnosis present

## 2022-05-05 LAB — COMPREHENSIVE METABOLIC PANEL
ALT: 13 U/L (ref 0–44)
AST: 16 U/L (ref 15–41)
Albumin: 3.2 g/dL — ABNORMAL LOW (ref 3.5–5.0)
Alkaline Phosphatase: 55 U/L (ref 38–126)
Anion gap: 8 (ref 5–15)
BUN: 12 mg/dL (ref 8–23)
CO2: 26 mmol/L (ref 22–32)
Calcium: 8.6 mg/dL — ABNORMAL LOW (ref 8.9–10.3)
Chloride: 106 mmol/L (ref 98–111)
Creatinine, Ser: 1.2 mg/dL — ABNORMAL HIGH (ref 0.44–1.00)
GFR, Estimated: 47 mL/min — ABNORMAL LOW (ref >60)
Glucose, Bld: 111 mg/dL — ABNORMAL HIGH (ref 70–99)
Potassium: 3.8 mmol/L (ref 3.5–5.1)
Sodium: 140 mmol/L (ref 135–145)
Total Bilirubin: 0.6 mg/dL (ref 0.3–1.2)
Total Protein: 5.3 g/dL — ABNORMAL LOW (ref 6.5–8.1)

## 2022-05-05 LAB — PREPARE RBC (CROSSMATCH)

## 2022-05-05 LAB — CBC WITH DIFFERENTIAL/PLATELET
Abs Immature Granulocytes: 0.01 10*3/uL (ref 0.00–0.07)
Basophils Absolute: 0 10*3/uL (ref 0.0–0.1)
Basophils Relative: 0 %
Eosinophils Absolute: 0.1 10*3/uL (ref 0.0–0.5)
Eosinophils Relative: 1 %
HCT: 32 % — ABNORMAL LOW (ref 36.0–46.0)
Hemoglobin: 10 g/dL — ABNORMAL LOW (ref 12.0–15.0)
Immature Granulocytes: 0 %
Lymphocytes Relative: 38 %
Lymphs Abs: 1.8 10*3/uL (ref 0.7–4.0)
MCH: 31.5 pg (ref 26.0–34.0)
MCHC: 31.3 g/dL (ref 30.0–36.0)
MCV: 100.9 fL — ABNORMAL HIGH (ref 80.0–100.0)
Monocytes Absolute: 0.2 10*3/uL (ref 0.1–1.0)
Monocytes Relative: 5 %
Neutro Abs: 2.7 10*3/uL (ref 1.7–7.7)
Neutrophils Relative %: 56 %
Platelets: 146 10*3/uL — ABNORMAL LOW (ref 150–400)
RBC: 3.17 MIL/uL — ABNORMAL LOW (ref 3.87–5.11)
RDW: 12.3 % (ref 11.5–15.5)
WBC: 4.8 10*3/uL (ref 4.0–10.5)
nRBC: 0 % (ref 0.0–0.2)

## 2022-05-05 LAB — RESP PANEL BY RT-PCR (RSV, FLU A&B, COVID)  RVPGX2
Influenza A by PCR: NEGATIVE
Influenza B by PCR: NEGATIVE
Resp Syncytial Virus by PCR: NEGATIVE
SARS Coronavirus 2 by RT PCR: NEGATIVE

## 2022-05-05 LAB — LIPASE, BLOOD: Lipase: 20 U/L (ref 11–51)

## 2022-05-05 LAB — CBC
HCT: 27.6 % — ABNORMAL LOW (ref 36.0–46.0)
Hemoglobin: 8.8 g/dL — ABNORMAL LOW (ref 12.0–15.0)
MCH: 31.7 pg (ref 26.0–34.0)
MCHC: 31.9 g/dL (ref 30.0–36.0)
MCV: 99.3 fL (ref 80.0–100.0)
Platelets: 142 10*3/uL — ABNORMAL LOW (ref 150–400)
RBC: 2.78 MIL/uL — ABNORMAL LOW (ref 3.87–5.11)
RDW: 12.4 % (ref 11.5–15.5)
WBC: 5.2 10*3/uL (ref 4.0–10.5)
nRBC: 0 % (ref 0.0–0.2)

## 2022-05-05 LAB — RETICULOCYTES
Immature Retic Fract: 6.9 % (ref 2.3–15.9)
RBC.: 2.73 MIL/uL — ABNORMAL LOW (ref 3.87–5.11)
Retic Count, Absolute: 56 10*3/uL (ref 19.0–186.0)
Retic Ct Pct: 2.1 % (ref 0.4–3.1)

## 2022-05-05 LAB — TROPONIN I (HIGH SENSITIVITY): Troponin I (High Sensitivity): <2 ng/L (ref <18)

## 2022-05-05 MED ORDER — SODIUM CHLORIDE 0.9% IV SOLUTION: Freq: Once | INTRAVENOUS | Status: DC

## 2022-05-05 MED ORDER — PANTOPRAZOLE SODIUM 40 MG IV SOLR
40.0000 mg | Freq: Two times a day (BID) | INTRAVENOUS | Status: DC
  Administered 2022-05-06 – 2022-05-07 (×4): 40 mg via INTRAVENOUS
  Filled 2022-05-05 (×4): qty 10

## 2022-05-05 MED ORDER — LIDOCAINE-EPINEPHRINE 1 %-1:100000 IJ SOLN
INTRAMUSCULAR | Status: AC
  Filled 2022-05-05: qty 1

## 2022-05-05 MED ORDER — ACETAMINOPHEN 325 MG PO TABS
650.0000 mg | ORAL_TABLET | Freq: Once | ORAL | Status: AC
  Administered 2022-05-05: 650 mg via ORAL
  Filled 2022-05-05: qty 2

## 2022-05-05 MED ORDER — NICOTINE 21 MG/24HR TD PT24
21.0000 mg | MEDICATED_PATCH | Freq: Every day | TRANSDERMAL | Status: DC
  Administered 2022-05-06 – 2022-05-16 (×9): 21 mg via TRANSDERMAL
  Filled 2022-05-05 (×10): qty 1

## 2022-05-05 MED ORDER — FENTANYL CITRATE (PF) 100 MCG/2ML IJ SOLN
INTRAMUSCULAR | Status: DC | PRN
  Administered 2022-05-05 (×3): 25 ug via INTRAVENOUS

## 2022-05-05 MED ORDER — IOHEXOL 300 MG/ML  SOLN
150.0000 mL | Freq: Once | INTRAMUSCULAR | Status: AC | PRN
  Administered 2022-05-06: 65 mL via INTRA_ARTERIAL

## 2022-05-05 MED ORDER — CEFAZOLIN SODIUM-DEXTROSE 2-4 GM/100ML-% IV SOLN
INTRAVENOUS | Status: AC
  Filled 2022-05-05: qty 100

## 2022-05-05 MED ORDER — IOHEXOL 350 MG/ML SOLN
100.0000 mL | Freq: Once | INTRAVENOUS | Status: AC | PRN
  Administered 2022-05-05: 100 mL via INTRAVENOUS

## 2022-05-05 MED ORDER — SODIUM CHLORIDE 0.9 % IV BOLUS
500.0000 mL | Freq: Once | INTRAVENOUS | Status: AC
  Administered 2022-05-05: 500 mL via INTRAVENOUS

## 2022-05-05 MED ORDER — FENTANYL CITRATE (PF) 100 MCG/2ML IJ SOLN
INTRAMUSCULAR | Status: AC
  Filled 2022-05-05: qty 2

## 2022-05-05 MED ORDER — CEFAZOLIN SODIUM-DEXTROSE 2-4 GM/100ML-% IV SOLN
INTRAVENOUS | Status: DC | PRN
  Administered 2022-05-05: 2 g via INTRAVENOUS

## 2022-05-05 MED ORDER — DIPHENHYDRAMINE HCL 50 MG/ML IJ SOLN
INTRAMUSCULAR | Status: DC | PRN
  Administered 2022-05-05: 25 mg via INTRAVENOUS

## 2022-05-05 MED ORDER — MIDAZOLAM HCL 2 MG/2ML IJ SOLN
INTRAMUSCULAR | Status: DC | PRN
  Administered 2022-05-05: 1 mg via INTRAVENOUS
  Administered 2022-05-05 (×2): .5 mg via INTRAVENOUS

## 2022-05-05 MED ORDER — DIPHENHYDRAMINE HCL 50 MG/ML IJ SOLN
INTRAMUSCULAR | Status: AC
  Filled 2022-05-05: qty 1

## 2022-05-05 MED ORDER — SODIUM CHLORIDE 0.9 % IV SOLN
0.4000 ug/kg | Freq: Once | INTRAVENOUS | Status: DC
  Filled 2022-05-05: qty 6.9

## 2022-05-05 MED ORDER — MIDAZOLAM HCL 2 MG/2ML IJ SOLN
INTRAMUSCULAR | Status: AC
  Filled 2022-05-05: qty 2

## 2022-05-05 MED ORDER — SODIUM CHLORIDE 0.9 % IV SOLN: INTRAVENOUS | Status: DC

## 2022-05-05 NOTE — ED Notes (Signed)
The iv team was unable to get enough blood for the type and screen   the pt is a very difficult stick

## 2022-05-05 NOTE — Subjective & Objective (Signed)
pressure in the RLQ and LLQ had 2 diarrhea episodes she did not look but on the third time she noticed that there was bright red blood in the commode Pt is on Plavix  Hx of lung Ca sp radiation No syncope no SOB

## 2022-05-05 NOTE — Assessment & Plan Note (Signed)
-   will monitor on tele avoid QT prolonging medications, rehydrate correct electrolytes ? ?

## 2022-05-05 NOTE — Assessment & Plan Note (Signed)
Chronic stable continue albuterol as needed noncontributory

## 2022-05-05 NOTE — Assessment & Plan Note (Signed)
-   Spoke about importance of quitting spent 5 minutes discussing options for treatment, prior attempts at quitting, and dangers of smoking ? -At this point patient is    interested in quitting ? - order nicotine patch  ? - nursing tobacco cessation protocol ? ?

## 2022-05-05 NOTE — ED Provider Notes (Signed)
Care handoff from [place], PA-C at shift change.  Please see their note for further information.  HPI:  Karen [name] is a 77 y.o. female with history of of lung cancer, CAD, GERD, hypertension, hyperlipidemia, COPD, ICD in place, who presents the emergency department complaining of abdominal pain and bloody diarrhea.  Patient states that she started having some pain in her lower abdomen, worse on the left side.  She felt as though she needed to have a bowel movement.  She had 1 this morning, which she thought was diarrhea, did not pay much attention to it.  Had a second bowel movement later today where she had difficulty making it to the bathroom.  At that time she noticed a very small amount of stool, but the commode was filled with bright red blood.  Patient is on 75 mg Plavix daily.  Recently finished radiation treatment for lung cancer.       Physical Exam  BP 106/67 (BP Location: Right Arm)   Pulse 71   Temp 98.3 F (36.8 C)   Resp 20   SpO2 100%   Physical Exam Vitals and nursing note reviewed.  Constitutional:      General: She is not in acute distress.    Appearance: Normal appearance. She is normal weight. She is not ill-appearing, toxic-appearing or diaphoretic.  HENT:     Head: Normocephalic and atraumatic.  Cardiovascular:     Rate and Rhythm: Normal rate.  Pulmonary:     Effort: Pulmonary effort is normal. No respiratory distress.  Abdominal:     General: Abdomen is flat.     Palpations: Abdomen is soft.     Tenderness: There is abdominal tenderness in the left lower quadrant.  Musculoskeletal:        General: Normal range of motion.     Cervical back: Normal range of motion.  Skin:    General: Skin is warm and dry.  Neurological:     General: No focal deficit present.     Mental Status: She is alert.  Psychiatric:        Mood and Affect: Mood normal.        Behavior: Behavior normal.     Procedures  .Critical Care  Performed by: Bud Face, PA-C Authorized by: Bud Face, PA-C   Critical care provider statement:    Critical care time (minutes):  45   Critical care start time:  05/05/2022 8:00 PM   Critical care end time:  05/05/2022 8:45 PM   Critical care was necessary to treat or prevent imminent or life-threatening deterioration of the following conditions: Acute GI bleed with hypotension.   Critical care was time spent personally by me on the following activities:  Development of treatment plan with patient or surrogate, discussions with consultants, discussions with primary provider, evaluation of patient's response to treatment, examination of patient, obtaining history from patient or surrogate, ordering and review of laboratory studies, ordering and review of radiographic studies, pulse oximetry, re-evaluation of patient's condition and review of old charts   Care discussed with: admitting provider     ED Course / MDM   Clinical Course as of 05/05/22 1514  Sun May 05, 2022  1400 Consult placed to IV team [LR]    Clinical Course User Index [LR] Roemhildt, Cecille Aver, PA-C   Medical Decision Making Amount and/or Complexity of Data Reviewed Labs: ordered. Radiology: ordered.   This patient is a 77 y.o. female who presents to the  ED for concern of GI bleed, this involves an extensive number of treatment options, and is a complaint that carries with it a high risk of complications and morbidity. The emergent differential diagnosis prior to evaluation includes, but is not limited to,  Diverticulitis, IBD, colitis, mesenteric ischemia, colorectal cancer / polyps, hemorrhoids, rectal foreign body, anal fissure   This is not an exhaustive differential.   Past Medical History / Co-morbidities / Social History: lung cancer, CAD, GERD, hypertension, hyperlipidemia, COPD, ICD in place, on Plavix   Lab Tests: I ordered, and personally interpreted labs.  The pertinent results include:  hgb 10.0 down from 12.9 3 months ago.  Creatinine at baseline. No other acute laboratory findings   Imaging Studies: I ordered imaging studies including CXR, CTA GI bleed. I independently visualized and interpreted imaging which showed   CXR: No acute pulmonary process. The patient's known bilateral pulmonary nodules are better seen on prior CT.  CTA:   1. Active bleeding within the descending/sigmoid colon junction. No focal wall thickening or inflammation.  I agree with the radiologist interpretation.   Medications: I ordered medication including fluids  for hypotension, tylenol for pain, 1 unit RBCs for active GI bleed with hypotension. Reevaluation of the patient after these medicines showed that the patient improved. I have reviewed the patients home medicines and have made adjustments as needed.  Consultations Obtained: I requested consultation with the GI on call Dr. Candis Schatz,  and discussed lab and imaging findings as well as pertinent plan - they recommend: NPO, no prep indicated at this time. Admit to medicine, they will see the patient in consultation tomorrow. Also recommend IR consult for possible intervention this evening  Reached out to IR Dr. Maryelizabeth Kaufmann who states that if the patient is hemodynamically stable then no IR intervention required at this time  9:00 PM: Nursing staff informed me that the patient had another large bloody bowel movement and her blood pressures are now in the 80s. Started fluids and ordered 1 unit RBCs. Reached back out to Dr. Maryelizabeth Kaufmann who plans to mobilize his team for intervention this evening.   Disposition:  Patient presents today with complaints of LLQ pain and GI bleeding. She is on Plavix. Initially hemodynamically stable with only mild drop in hemoglobin, however patient continues to have persistent bloody stools. Patient will require admission. She is understanding and amenable with plan.   Discussed patient with hospitalist who agrees to admit.   After hospitalist Dr. Roel Cluck  saw the patient, given the soft blood pressures and plan for IR intervention tonight, she accepts patient for admission but requests critical care be made aware in case the patient decompensates.   My attending Dr. Regenia Skeeter has spoken to critical care Dr. Ruthann Cancer who has been made aware of patient. Plan for hospitalist admission.   This is a shared visit with supervising physician Dr. Regenia Skeeter who has independently evaluated patient & provided guidance in evaluation/management/disposition, in agreement with care            Bud Face, PA-C 05/05/22 2230    Sherwood Gambler, MD 05/05/22 2239

## 2022-05-05 NOTE — ED Notes (Signed)
Headache pain

## 2022-05-05 NOTE — ED Notes (Signed)
To c-t  pt c/o a headache also

## 2022-05-05 NOTE — Assessment & Plan Note (Signed)
Hold Plavix Hold metoprolol for tonight given soft blood pressures

## 2022-05-05 NOTE — ED Provider Notes (Signed)
Patient has a current lower GI bleed. She developed transient hypotension (80s).  Given this with active bleeding and a recurrent bloody bowel movement, she was started on a unit of blood.  Her blood pressure has come up quickly, even before the blood transfusion.  Due to this we reconsulted IR and she will now go for angiography.  I have also made Dr. Ruthann Cancer aware and otherwise she does not need emergent ICU admission but can be admitted to the hospital service and they can be consulted as needed.  CRITICAL CARE Performed by: Ephraim Hamburger   Total critical care time: 30 minutes  Critical care time was exclusive of separately billable procedures and treating other patients.  Critical care was necessary to treat or prevent imminent or life-threatening deterioration.  Critical care was time spent personally by me on the following activities: development of treatment plan with patient and/or surrogate as well as nursing, discussions with consultants, evaluation of patient's response to treatment, examination of patient, obtaining history from patient or surrogate, ordering and performing treatments and interventions, ordering and review of laboratory studies, ordering and review of radiographic studies, pulse oximetry and re-evaluation of patient's condition.    Sherwood Gambler, MD 05/05/22 2213

## 2022-05-05 NOTE — Assessment & Plan Note (Signed)
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 appreciate their consult   - serial CBC.    - Monitor for any recurrence,  evidence of hemodynamic instability or significant blood loss -  type and screen,  - Transfuse  evidence of significant  bleeding  - Establish at least 2 PIV and fluid resuscitate   -   nothing by mouth post midnight,  -  monitor for Recurrent significant  Bleeding  Hold plavix

## 2022-05-05 NOTE — Assessment & Plan Note (Signed)
Given soft blood pressures we will hold off on home medications

## 2022-05-05 NOTE — ED Notes (Signed)
PT bp dropping and pt experiencing increasing abd pain.  PA notified.

## 2022-05-05 NOTE — ED Provider Notes (Signed)
[facility] EMERGENCY DEPARTMENT Provider Note   CSN: [id number] Arrival date & time: 05/05/22  1315     History  Chief Complaint  Patient presents with   Abdominal Pain   Melena    Karen [name] is a 77 y.o. female with history of of lung cancer, CAD, GERD, hypertension, hyperlipidemia, COPD, ICD in place, who presents the emergency department complaining of abdominal pain and bloody diarrhea.  Patient states that she started having some pain in her lower abdomen, worse on the left side.  She felt as though she needed to have a bowel movement.  She had 1 this morning, which she thought was diarrhea, did not pay much attention to it.  Had a second bowel movement later today where she had difficulty making it to the bathroom.  At that time she noticed a very small amount of stool, but the commode was filled with bright red blood.  Patient is on 75 mg Plavix daily.  Recently finished radiation treatment for lung cancer.   Abdominal Pain Associated symptoms: diarrhea        Home Medications Prior to Admission medications   Medication Sig Start Date End Date Taking? Authorizing Provider  acetaminophen (TYLENOL) 500 MG tablet Take 500 mg by mouth every 6 (six) hours as needed for headache.    [provider]  albuterol (VENTOLIN HFA) 108 (90 Base) MCG/ACT inhaler Inhale 2 puffs into the lungs every 6 (six) hours as needed for wheezing or shortness of breath. 05/21/21   Cobb, Karie Schwalbe, NP  clopidogrel (PLAVIX) 75 MG tablet TAKE 1 TABLET BY MOUTH DAILY 12/06/21   Jettie Booze, MD  cyclobenzaprine (FLEXERIL) 10 MG tablet Take 10 mg by mouth 3 (three) times daily as needed for muscle spasms. 01/16/21   [provider]  doxycycline (VIBRA-TABS) 100 MG tablet Take 1 tablet (100 mg total) by mouth 2 (two) times daily. 12/11/21   Curt Bears, MD  famotidine (PEPCID) 40 MG tablet Take 1 tablet (40 mg total) by mouth at bedtime. 03/28/22   Collene Gobble, MD  fluticasone (FLONASE) 50 MCG/ACT nasal spray Place 1 spray into both nostrils daily. Patient taking differently: Place 1 spray into both nostrils daily as needed for allergies. 04/06/21   Cobb, Karie Schwalbe, NP  furosemide (LASIX) 20 MG tablet TAKE 1 TABLET BY MOUTH DAILY 03/05/22   Jettie Booze, MD  guaiFENesin (MUCINEX) 600 MG 12 hr tablet Take 1 tablet (600 mg total) by mouth 2 (two) times daily. 04/06/21   Cobb, Karie Schwalbe, NP  Influenza Vac High-Dose Quad (FLUZONE HIGH-DOSE QUADRIVALENT) 0.7 ML [facility] ADMINISTERED    [provider]  isosorbide mononitrate (IMDUR) 60 MG 24 hr tablet Take 1 tablet (60 mg total) by mouth 2 (two) times daily. 10/29/21   Jettie Booze, MD  lisinopril (ZESTRIL) 10 MG tablet TAKE 1 TABLET BY MOUTH DAILY 03/05/22   Jettie Booze, MD  loratadine (CLARITIN) 10 MG tablet Take 1 tablet (10 mg total) by mouth daily. Patient taking differently: Take 10 mg by mouth daily as needed for allergies. 04/06/21   Cobb, Karie Schwalbe, NP  losartan (COZAAR) 50 MG tablet Take 1 tablet (50 mg total) by mouth daily. 03/28/22   Collene Gobble, MD  naloxone [facility]) nasal spray 4 mg/0.1 mL SMARTSIG:Both Nares 02/26/22   [provider]  nitroGLYCERIN (NITROSTAT) 0.4 MG SL tablet Place 1 tablet (0.4 mg total) under the tongue every 5 (five) minutes  as needed for chest pain (X3 DOSES MAX). 02/16/21   Bhagat, Crista Luria, PA  oxyCODONE-acetaminophen (PERCOCET) 7.5-325 MG tablet Take 1-2 tablets by mouth every 4 (four) hours as needed for moderate pain (back pain).    [provider]  pregabalin (LYRICA) 50 MG capsule Take 50 mg by mouth at bedtime as needed (leg pain). 01/16/21   [provider]  Tiotropium Bromide-Olodaterol (STIOLTO RESPIMAT) 2.5-2.5 MCG/ACT AERS Inhale 2 puffs into the lungs daily. 03/28/22   Collene Gobble, MD      Allergies    Codeine and Percocet [oxycodone-acetaminophen]    Review of Systems    Review of Systems  Gastrointestinal:  Positive for abdominal pain, blood in stool and diarrhea.  Neurological:  Positive for weakness.  All other systems reviewed and are negative.   Physical Exam Updated Vital Signs BP 106/67 (BP Location: Right Arm)   Pulse 71   Temp 98.3 F (36.8 C)   Resp 20   SpO2 100%  Physical Exam Vitals and nursing note reviewed.  Constitutional:      Appearance: Normal appearance.  HENT:     Head: Normocephalic and atraumatic.  Eyes:     Conjunctiva/sclera: Conjunctivae normal.  Cardiovascular:     Rate and Rhythm: Normal rate and regular rhythm.  Pulmonary:     Effort: Pulmonary effort is normal. No respiratory distress.     Breath sounds: Normal breath sounds.  Abdominal:     General: There is no distension.     Palpations: Abdomen is soft.     Tenderness: There is abdominal tenderness in the left lower quadrant. There is guarding.  Skin:    General: Skin is warm and dry.  Neurological:     General: No focal deficit present.     Mental Status: She is alert.     ED Results / Procedures / Treatments   Labs (all labs ordered are listed, but only abnormal results are displayed) Labs Reviewed  RESP PANEL BY RT-PCR (RSV, FLU A&B, COVID)  [id number]  CBC WITH DIFFERENTIAL/PLATELET  COMPREHENSIVE METABOLIC PANEL  LIPASE, BLOOD  TYPE AND SCREEN  TROPONIN I (HIGH SENSITIVITY)    EKG EKG Interpretation  Date/Time:  Sunday May 05 2022 13:38:56 EST Ventricular Rate:  73 PR Interval:  147 QRS Duration: 63 QT Interval:  642 QTC Calculation: 708 R Axis:   41 Text Interpretation: Sinus rhythm Borderline abnrm T, anterolateral leads Prolonged QT interval Confirmed by Dene Gentry [phone]) on 05/05/2022 2:43:31 PM  Radiology DG Chest Port 1 View  Result Date: 05/05/2022 CLINICAL DATA:  Weakness. EXAM: PORTABLE CHEST 1 VIEW COMPARISON:  Chest radiograph dated 12/09/2021 and CT chest dated 02/04/2022. FINDINGS: The heart size and mediastinal  contours are within normal limits. Vascular calcifications are seen in the aortic arch. The patient's known bilateral pulmonary nodules are better seen on prior CT. No new focal consolidation, pleural effusion, or pneumothorax is identified. The visualized skeletal structures are unremarkable. A left subclavian approach cardiac device and median sternotomy wires are redemonstrated. IMPRESSION: No acute pulmonary process. The patient's known bilateral pulmonary nodules are better seen on prior CT. Electronically Signed   By: Zerita Boers M.D.   On: 05/05/2022 14:36    Procedures Procedures    Medications Ordered in ED Medications - No data to display  ED Course/ Medical Decision Making/ A&P Clinical Course as of 05/05/22 1517  Sun May 05, 2022  1400 Consult placed to IV team [LR]    Clinical Course User Index [  LR] Ivan Maskell, Cecille Aver, PA-C                           Medical Decision Making Amount and/or Complexity of Data Reviewed Labs: ordered. Radiology: ordered.  This patient is a 77 y.o. female who presents to the ED for concern of bloody diarrhea, this involves an extensive number of treatment options, and is a complaint that carries with it a high risk of complications and morbidity. The emergent differential diagnosis prior to evaluation includes, but is not limited to,  Diverticulitis, IBD, colitis, mesenteric ischemia, colorectal cancer / polyps, hemorrhoids, rectal foreign body, anal fissure. This is not an exhaustive differential.   Past Medical History / Co-morbidities / Social History: lung cancer, CAD, GERD, hypertension, hyperlipidemia, COPD, ICD in place, on Plavix  Additional history: Chart reviewed. Pertinent results include: Lives with Dr. Kyung Rudd with [facility] for COPD and lung cancer.  Follows with Dr. Caryl Comes and Dr. Irish Lack with [facility].  Physical Exam: Physical exam performed. The pertinent findings include: Normal vital signs, no acute distress.   Left lower quadrant abdominal tenderness to palpation with mild guarding.  Lab Tests: I ordered the following labs: CBC, CMP, lipase, type and screen, troponin, and respiratory panel. Results are pending IV placement via IV team.    Imaging Studies: I ordered imaging studies including chest x-ray. I independently visualized and interpreted imaging which showed no acute findings. I agree with the radiologist interpretation. CT angio abdomen pending.    Cardiac Monitoring:  The patient was maintained on a cardiac monitor.  My attending physician Dr. Francia Greaves viewed and interpreted the cardiac monitored which showed an underlying rhythm of: sinus rhythm with prolonged QT interval. I agree with this interpretation.   Disposition: Patient discussed and care transferred to [place] PA-C at shift change. Please see his/her note for further details regarding further ED course and disposition. Plan at time of handoff is await laboratory evaluation and CT imaging. Suspect likely lower GI bleed from diverticular source, exacerbated by Plavix use. Anticipate patient will require GI consult and hospitalist admission.    I discussed this case with my attending physician Dr. Francia Greaves who cosigned this note including patient's presenting symptoms, physical exam, and planned diagnostics and interventions. Attending physician stated agreement with plan or made changes to plan which were implemented.    Final Clinical Impression(s) / ED Diagnoses Final diagnoses:  LLQ pain  Bloody diarrhea    Rx / DC Orders ED Discharge Orders     None      Portions of this report may have been transcribed using voice recognition software. Every effort was made to ensure accuracy; however, inadvertent computerized transcription errors may be present.    Estill Cotta 05/05/22 1517    Valarie Merino, MD 05/05/22 [phone]

## 2022-05-05 NOTE — ED Triage Notes (Signed)
Pt BIB [facility] from home c/o some pressure in the RLQ and LLQ. Pt states she had 2 diarrhea episodes today. The first one she made it to the bathroom and paid no attention to it. Then she had a 2nd one where she could not make it to the bathroom and noticed bright red blood in the diarrhea. Pt is on Eliquis.

## 2022-05-05 NOTE — Assessment & Plan Note (Signed)
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

## 2022-05-05 NOTE — Consult Note (Addendum)
Vascular and Interventional Radiology  PRE PROCEDURE NOTE  Assessment  Plan:   [name] is a 77 y.o. year old female who will undergo MESENTERIC ANGIOGRAM, POSSIBLE EMBOLIZATION in Interventional Radiology.  The procedure has been fully reviewed with the patient/patient's authorized representative. The risks, benefits and alternatives have been explained, and the patient/patient's authorized representative has consented to the procedure. -- The patient will accept blood products in an emergent situation. -- The patient does not have a Do Not Resuscitate order in effect.  HPI: [name] is a 77 y.o. year old female w PMHx significant for smoking and NSCLC, CAD with prior cardiac arrest, AICD, coronary stent and CABG. Pt on Plavix and presented to ER w [facility]. Pt became increasingly hypotensive in ER, requiring fluid resuscitation and transfusion. CTA revealing acute LGIB at descending colon. VIR consulted for evaluation and potential embolization.  Informed consent was obtained, witnessed and placed in the patient's chart.  Allergies:  Allergies  Allergen Reactions   Codeine Itching and Swelling   Percocet [Oxycodone-Acetaminophen] Hives and Itching    Medications:  No current facility-administered medications on file prior to encounter.   Current Outpatient Medications on File Prior to Encounter  Medication Sig Dispense Refill   acetaminophen (TYLENOL) 500 MG tablet Take 500 mg by mouth every 6 (six) hours as needed for headache.     albuterol (VENTOLIN HFA) 108 (90 Base) MCG/ACT inhaler Inhale 2 puffs into the lungs every 6 (six) hours as needed for wheezing or shortness of breath. 54 g 3   clopidogrel (PLAVIX) 75 MG tablet TAKE 1 TABLET BY MOUTH DAILY 15 tablet 0   cyclobenzaprine (FLEXERIL) 10 MG tablet Take 10 mg by mouth 3 (three) times daily as needed for muscle spasms.     doxycycline (VIBRA-TABS) 100 MG tablet Take 1 tablet (100 mg total) by mouth 2 (two)  times daily. 30 tablet 0   famotidine (PEPCID) 40 MG tablet Take 1 tablet (40 mg total) by mouth at bedtime. 30 tablet 2   fluticasone (FLONASE) 50 MCG/ACT nasal spray Place 1 spray into both nostrils daily. (Patient taking differently: Place 1 spray into both nostrils daily as needed for allergies.) 18.2 mL 2   furosemide (LASIX) 20 MG tablet TAKE 1 TABLET BY MOUTH DAILY 15 tablet 0   guaiFENesin (MUCINEX) 600 MG 12 hr tablet Take 1 tablet (600 mg total) by mouth 2 (two) times daily. 60 tablet 3   Influenza Vac High-Dose Quad (FLUZONE HIGH-DOSE QUADRIVALENT) 0.7 ML [facility] ADMINISTERED     isosorbide mononitrate (IMDUR) 60 MG 24 hr tablet Take 1 tablet (60 mg total) by mouth 2 (two) times daily. 30 tablet 0   lisinopril (ZESTRIL) 10 MG tablet TAKE 1 TABLET BY MOUTH DAILY 15 tablet 0   loratadine (CLARITIN) 10 MG tablet Take 1 tablet (10 mg total) by mouth daily. (Patient taking differently: Take 10 mg by mouth daily as needed for allergies.) 30 tablet 11   losartan (COZAAR) 50 MG tablet Take 1 tablet (50 mg total) by mouth daily. 30 tablet 2   naloxone (NARCAN) nasal spray 4 mg/0.1 mL SMARTSIG:Both Nares     nitroGLYCERIN (NITROSTAT) 0.4 MG SL tablet Place 1 tablet (0.4 mg total) under the tongue every 5 (five) minutes as needed for chest pain (X3 DOSES MAX). 25 tablet 11   oxyCODONE-acetaminophen (PERCOCET) 7.5-325 MG tablet Take 1-2 tablets by mouth every 4 (four) hours as needed for moderate pain (back pain).  pregabalin (LYRICA) 50 MG capsule Take 50 mg by mouth at bedtime as needed (leg pain).     Tiotropium Bromide-Olodaterol (STIOLTO RESPIMAT) 2.5-2.5 MCG/ACT AERS Inhale 2 puffs into the lungs daily. 4 g 2    PSH:  Past Surgical History:  Procedure Laterality Date   CARDIAC DEFIBRILLATOR PLACEMENT  03/2006   [employer]. remote-yes.    CORONARY ARTERY BYPASS GRAFT     x4   IMPLANTABLE CARDIOVERTER DEFIBRILLATOR GENERATOR CHANGE N/A 03/03/2013   Procedure: IMPLANTABLE CARDIOVERTER  DEFIBRILLATOR GENERATOR CHANGE;  Surgeon: [name] Salvia, MD;  Location: [facility] CATH LAB;  Service: Cardiovascular;  Laterality: N/A;   stent implant     x1. possibly x2.     PMH:  Past Medical History:  Diagnosis Date   Acute myocardial infarction, unspecified site, episode of care unspecified    Adenocarcinoma, 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     Brief Physical Examination: Vitals:   05/05/22 2130 05/05/22 2210  BP: (!) 114/59 118/62  Pulse:  69  Resp: 20 19  Temp:  98.4 F (36.9 C)  SpO2:     General: WD, WN female in NAD HEENT: Normocephalic, atraumatic Lungs: Respirations non-labored  ASA Grade: 3 Mallampati Class: 2   Roanna Banning, MD [facility] Rad[facility]   Pager. [phone] Clinic. [phone]  I spent a total of 25 Minutes of face-to-face time in clinical consultation, greater than 50% of which was counseling/coordinating care for [name] evaluation for embolization of gastrointestinal bleeding.

## 2022-05-05 NOTE — ED Notes (Signed)
PT to CT.

## 2022-05-05 NOTE — H&P (Signed)
Karen [name]:[id number] DOB: 1945-10-15 DOA: 05/05/2022     PCP: [facility] [place]   Outpatient Specialists:   CARDS:  Dr. Irish Lack, Dr. Caryl Comes     Pulmonary Dr. Lamonte Sakai  Oncology   Dr. Curt Bears    Patient arrived to ER on 05/05/22 at 1315 Referred by Attending Sherwood Gambler, MD   Patient coming from:    home Lives With family    Chief Complaint:   Chief Complaint  Patient presents with   Abdominal Pain   Melena    HPI: Karen [name] is a 77 y.o. 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 GERD history of cardiac arrest noted 2004 to be V-fib now status post ICD  Presented with bright red blood per rectum pressure in the RLQ and LLQ had 2 diarrhea episodes she did not look but on the third time she noticed that there was bright red blood in the commode Pt is on Plavix  Hx of lung Ca sp radiation No syncope no SOB       Still smokes but is interested in quiting, no ETOH  Last Plavix dose was this AM Continues to have GI bleeding Has occasional CP that is chronic not wore than baseline  Reports abd pain   Patient states in the past she had a colonoscopy but cannot remember who did the procedure she was told she has polyps   Initial COVID TEST  NEGATIVE   Lab Results  Component Value Date   [place] 05/05/2022   [place] NEGATIVE 02/16/2021   [place] Not Detected 02/26/2019     Regarding pertinent Chronic problems:     Hyperlipidemia - on statins Lipitor (atorvastatin)  Lipid Panel     Component Value Date/Time   CHOL 181 02/16/2021 0935   CHOL 165 08/16/2019 0959   TRIG 73 02/16/2021 0935   HDL 82 02/16/2021 0935   HDL 74 08/16/2019 0959   CHOLHDL 2.2 02/16/2021 0935   VLDL 15 02/16/2021 0935   LDLCALC 84 02/16/2021 0935   LDLCALC 80 08/16/2019 0959   LDLDIRECT 82 01/15/2018 1536   LABVLDL 11  08/16/2019 0959     HTN on Norvasc lisinopril Imdur, metoprolol   chronic CHF diastolic/systolic/ combined - last echo EF 50-55% Left ventricular diastolic   parameters were normal.  Lasix   CAD  - On Aspirin, statin, betablocker, Plavix                 - followed by cardiology                - last cardiac cath  Status post CABG AICD chronic recurrent chest pain with multiple cardiac catheterizations now medically managed on Plavix  Diagnosed with lung cancer in 2022 followed by Dr. Julien Nordmann      COPD -  followed by pulmonology   not  on baseline oxygen       CKD stage IIIa- baseline Cr 1.3 Estimated Creatinine Clearance: 37.3 mL/min (A) (by C-G formula based on SCr of 1.2 mg/dL (H)).  Lab Results  Component Value Date   CREATININE 1.20 (H) 05/05/2022   CREATININE 1.31 (H) 02/04/2022   CREATININE 1.38 (H) 12/09/2021     Chronic anemia - baseline hg Hemoglobin & Hematocrit  Recent Labs    12/09/21 1950 02/04/22 1426 05/05/22 1354  HGB 11.9* 12.9 10.0*  While in ER: Clinical Course as of 05/05/22 2049  Nancy Fetter May 05, 2022  1400 Consult placed to IV team [LR]    Clinical Course User Index [LR] Roemhildt, Lorin T, PA-C    Noted to have hemoglobin down to 10  CT angio bleed was done Active bleeding within the descending/sigmoid colon junction  [facility] has been consulted will see patient in the morning recommend IR consult ER has discussed case with IR as well who states will continue to keep the patient on the list but call back if she becomes decompensated  Ordered  CXR -  NON acute  The patient's known bilateral pulmonary nodules are better seen on prior CT.   Following Medications were ordered in ER: Medications  sodium chloride 0.9 % bolus 500 mL (has no administration in time range)  0.9 %  sodium chloride infusion (Manually program via Guardrails IV Fluids) (has no administration in time range)  sodium chloride 0.9 % bolus 500 mL (0 mLs Intravenous Stopped 05/05/22  1842)  iohexol (OMNIPAQUE) 350 MG/ML injection 100 mL (100 mLs Intravenous Contrast Given 05/05/22 1748)  acetaminophen (TYLENOL) tablet 650 mg (650 mg Oral Given 05/05/22 1843)    _______________________________________________________ ER Provider Called:  [facility]    Dr. Candis Schatz They Recommend admit to medicine   Will see in AM     ER Provider Called:  IR   Dr. Maryelizabeth Kaufmann They Recommend admit to medicine       ED Triage Vitals  Enc Vitals Group     BP 05/05/22 1340 106/67     Pulse Rate 05/05/22 1340 71     Resp 05/05/22 1340 20     Temp 05/05/22 1340 98.3 F (36.8 C)     Temp Source 05/05/22 1841 Oral     SpO2 05/05/22 1336 99 %     Weight --      Height --      Head Circumference --      Peak Flow --      Pain Score 05/05/22 1342 0     Pain Loc --      Pain Edu? --      Excl. in [place]? --   TMAX(24)@     _________________________________________ Significant initial  Findings: Abnormal Labs Reviewed  CBC WITH DIFFERENTIAL/PLATELET - Abnormal; Notable for the following components:      Result Value   RBC 3.17 (*)    Hemoglobin 10.0 (*)    HCT 32.0 (*)    MCV 100.9 (*)    Platelets 146 (*)    All other components within normal limits  COMPREHENSIVE METABOLIC PANEL - Abnormal; Notable for the following components:   Glucose, Bld 111 (*)    Creatinine, Ser 1.20 (*)    Calcium 8.6 (*)    Total Protein 5.3 (*)    Albumin 3.2 (*)    GFR, Estimated 47 (*)    All other components within normal limits     _________________________ Troponin  <2  ECG: Ordered Personally reviewed and interpreted by me showing: HR : 72 Rhythm: Sinus rhythm Borderline abnrm T, anterolateral leads Prolonged QT interval QTC 670     The recent clinical data is shown below. Vitals:   05/05/22 1645 05/05/22 1700 05/05/22 1815 05/05/22 1841  BP: 104/64 (!) 94/57 (!) 107/54   Pulse: 77 74 70   Resp: (!) 26 (!) 22 17   Temp:    98.2 F (36.8 C)  TempSrc:    Oral  SpO2: 99% 99% 100%      WBC     Component Value Date/Time   WBC 4.8 05/05/2022 1354   LYMPHSABS 1.8 05/05/2022 1354   MONOABS 0.2 05/05/2022 1354   EOSABS 0.1 05/05/2022 1354   BASOSABS 0.0 05/05/2022 1354     Results for orders placed or performed during the hospital encounter of 05/05/22  Resp panel by RT-PCR (RSV, Flu A&B, Covid) Anterior Nasal Swab     Status: None   Collection Time: 05/05/22  1:54 PM   Specimen: Anterior Nasal Swab  Result Value Ref Range Status   SARS Coronavirus 2 by RT PCR NEGATIVE NEGATIVE Final         Influenza A by PCR NEGATIVE NEGATIVE Final   Influenza B by PCR NEGATIVE NEGATIVE Final         Resp Syncytial Virus by PCR NEGATIVE NEGATIVE Final          _______________________________________________ Hospitalist was called for admission for lower gi bleed     The following Work up has been ordered so far:  Orders Placed This Encounter  Procedures   Resp panel by RT-PCR (RSV, Flu A&B, Covid) Anterior Nasal Swab   DG Chest Port 1 View   CT ANGIO GI BLEED   CBC with Differential   Comprehensive metabolic panel   Lipase, blood   Cardiac monitoring   Informed Consent Details: Physician/Practitioner Attestation; Transcribe to consent form and obtain patient signature   Consult to gastroenterology   Consult to interventional radiologist (physician to physician consult) [phone]   Consult to hospitalist   EKG 12-Lead   ED EKG   Type and screen [place]   Prepare RBC (crossmatch)   Saline lock IV     OTHER Significant initial  Findings:  labs showing:  Recent Labs  Lab 05/05/22 1354  NA 140  K 3.8  CO2 26  GLUCOSE 111*  BUN 12  CREATININE 1.20*  CALCIUM 8.6*    Cr  stable,    Lab Results  Component Value Date   CREATININE 1.20 (H) 05/05/2022   CREATININE 1.31 (H) 02/04/2022   CREATININE 1.38 (H) 12/09/2021    Recent Labs  Lab 05/05/22 1354  AST 16  ALT 13  ALKPHOS 55  BILITOT 0.6  PROT 5.3*  ALBUMIN 3.2*   Lab  Results  Component Value Date   CALCIUM 8.6 (L) 05/05/2022          Plt: Lab Results  Component Value Date   PLT 146 (L) 05/05/2022        Recent Labs  Lab 05/05/22 1354  WBC 4.8  NEUTROABS 2.7  HGB 10.0*  HCT 32.0*  MCV 100.9*  PLT 146*    HG/HCT  stable,       Component Value Date/Time   HGB 10.0 (L) 05/05/2022 1354   HGB 12.9 02/04/2022 1426   HGB 12.1 03/06/2021 0858   HCT 32.0 (L) 05/05/2022 1354   HCT 36.5 03/06/2021 0858   MCV 100.9 (H) 05/05/2022 1354   MCV 92 03/06/2021 0858      Recent Labs  Lab 05/05/22 1354  LIPASE 20        Cultures: No results found for: "SDES", "SPECREQUEST", "CULT", "REPTSTATUS"   Radiological Exams on Admission: CT ANGIO GI BLEED  Result Date: 05/05/2022 CLINICAL DATA:  Lower GI bleeding. EXAM: CTA ABDOMEN AND PELVIS WITHOUT AND WITH CONTRAST TECHNIQUE: Multidetector CT imaging of the abdomen and pelvis was performed using the standard protocol  during bolus administration of intravenous contrast. Multiplanar reconstructed images and MIPs were obtained and reviewed to evaluate the vascular anatomy. RADIATION DOSE REDUCTION: This exam was performed according to the departmental dose-optimization program which includes automated exposure control, adjustment of the mA and/or kV according to patient size and/or use of iterative reconstruction technique. CONTRAST:  140mL OMNIPAQUE IOHEXOL 350 MG/ML SOLN COMPARISON:  PET-CT 02/22/2022. FINDINGS: VASCULAR Aorta: Normal caliber aorta without aneurysm, dissection, vasculitis or significant stenosis. There are atherosclerotic calcifications throughout the aorta. Celiac: Patent without evidence of aneurysm, dissection, vasculitis or significant stenosis. SMA: Patent without evidence of aneurysm, dissection, vasculitis or significant stenosis. Renals: Both renal arteries are patent without evidence of aneurysm, dissection, vasculitis, fibromuscular dysplasia or significant stenosis. IMA: Patent  without evidence of aneurysm, dissection, vasculitis or significant stenosis. Inflow: Patent without evidence of aneurysm, dissection, vasculitis or significant stenosis. Atherosclerotic calcifications are present. Proximal Outflow: Bilateral common femoral and visualized portions of the superficial and profunda femoral arteries are patent without evidence of aneurysm, dissection, vasculitis or significant stenosis. Veins: Within normal limits. Review of the MIP images confirms the above findings. NON-VASCULAR Lower chest: Scarring in the right lung base is unchanged. Hepatobiliary: There are 3 subcentimeter rounded hypodensities in the right lobe of the liver. The larger are unchanged from prior in the smaller are too small to characterize. These are favored as cysts or hemangiomas. Gallbladder surgically absent. There is no biliary ductal dilatation. Pancreas: Unremarkable. No pancreatic ductal dilatation or surrounding inflammatory changes. Spleen: Normal in size without focal abnormality. Adrenals/Urinary Tract: There are subcentimeter cortical cysts in each kidney. Otherwise, the kidneys, adrenal glands and bladder are within normal limits. Stomach/Bowel: There is active arterial extravasation of contrast/bleeding within the descending/sigmoid colon junction. There is no focal wall thickening or inflammation in this region. There is no bowel obstruction or free air. There is diffuse colonic diverticulosis. The appendix, stomach and small bowel loops appear normal. Lymphatic: No enlarged lymph nodes are identified. Reproductive: Status post hysterectomy. No adnexal masses. Other: No abdominal wall hernia or abnormality. No abdominopelvic ascites. Musculoskeletal: Degenerative changes affect the spine. IMPRESSION: 1. Active bleeding within the descending/sigmoid colon junction. No focal wall thickening or inflammation. Aortic Atherosclerosis (ICD10-I70.0). NON-VASCULAR 1. No other acute localizing process in the  abdomen or pelvis. 2. Subcentimeter Bosniak II benign renal cyst. No follow-up imaging is recommended. JACR 2018 Feb; 264-273, Management of the Incidental Renal Mass on CT, [employer] 2021; [phone], Bosniak Classification of Cystic Renal Masses, Version 2019. Electronically Signed   By: Ronney Asters M.D.   On: 05/05/2022 18:08   DG Chest Port 1 View  Result Date: 05/05/2022 CLINICAL DATA:  Weakness. EXAM: PORTABLE CHEST 1 VIEW COMPARISON:  Chest radiograph dated 12/09/2021 and CT chest dated 02/04/2022. FINDINGS: The heart size and mediastinal contours are within normal limits. Vascular calcifications are seen in the aortic arch. The patient's known bilateral pulmonary nodules are better seen on prior CT. No new focal consolidation, pleural effusion, or pneumothorax is identified. The visualized skeletal structures are unremarkable. A left subclavian approach cardiac device and median sternotomy wires are redemonstrated. IMPRESSION: No acute pulmonary process. The patient's known bilateral pulmonary nodules are better seen on prior CT. Electronically Signed   By: Zerita Boers M.D.   On: 05/05/2022 14:36   _______________________________________________________________________________________________________ Latest  Blood pressure (!) 107/54, pulse 70, temperature 98.2 F (36.8 C), temperature source Oral, resp. rate 17, SpO2 100 %.   Vitals  labs and radiology finding personally reviewed  Review of Systems:  Pertinent positives include:   abdominal pain,  blood in stool,  Constitutional:  No weight loss, night sweats, Fevers, chills, fatigue, weight loss  HEENT:  No headaches, Difficulty swallowing,Tooth/dental problems,Sore throat,  No sneezing, itching, ear ache, nasal congestion, post nasal drip,  Cardio-vascular:  No chest pain, Orthopnea, PND, anasarca, dizziness, palpitations.no Bilateral lower extremity swelling  GI:  No heartburn, indigestion, nausea, vomiting, diarrhea, change  in bowel habits, loss of appetite, melena,hematemesis Resp:  no shortness of breath at rest. No dyspnea on exertion, No excess mucus, no productive cough, No non-productive cough, No coughing up of blood.No change in color of mucus.No wheezing. Skin:  no rash or lesions. No jaundice GU:  no dysuria, change in color of urine, no urgency or frequency. No straining to urinate.  No flank pain.  Musculoskeletal:  No joint pain or no joint swelling. No decreased range of motion. No back pain.  Psych:  No change in mood or affect. No depression or anxiety. No memory loss.  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       Past Surgical History:  Procedure Laterality Date   CARDIAC DEFIBRILLATOR PLACEMENT  03/2006   [employer]. remote-yes.    CORONARY ARTERY BYPASS GRAFT     x4   IMPLANTABLE CARDIOVERTER DEFIBRILLATOR GENERATOR CHANGE N/A 03/03/2013   Procedure: IMPLANTABLE CARDIOVERTER DEFIBRILLATOR GENERATOR CHANGE;  Surgeon: Deboraha Sprang, MD;  Location: [facility] CATH LAB;  Service: Cardiovascular;  Laterality: N/A;   stent implant     x1. possibly x2.      Social History:  Ambulatory   independently      reports that she has been smoking cigarettes. She started smoking about 56 years ago. She has a 27.00 pack-year smoking history. She has never used smokeless tobacco. She reports that she does not drink alcohol and does not use drugs.     Family History:   Family History  Problem Relation Age of Onset   Heart attack Mother    Heart disease Mother    Breast cancer Maternal Aunt    ______________________________________________________________________________________________ Allergies: Allergies  Allergen Reactions   Codeine Itching and Swelling   Percocet [Oxycodone-Acetaminophen] Hives and Itching     Prior to Admission medications   Medication Sig Start Date End Date Taking? Authorizing Provider  acetaminophen (TYLENOL) 500 MG tablet Take 500 mg by mouth every 6 (six) hours as needed for headache.    [provider]  albuterol (VENTOLIN HFA) 108 (90 Base) MCG/ACT inhaler Inhale 2 puffs into the lungs every 6 (six) hours as needed for wheezing or shortness of breath. 05/21/21   Cobb, Karie Schwalbe, NP  clopidogrel (PLAVIX) 75 MG tablet TAKE 1 TABLET BY MOUTH DAILY 12/06/21   Irish Lack,  Charlann Lange, MD  cyclobenzaprine (FLEXERIL) 10 MG tablet Take 10 mg by mouth 3 (three) times daily as needed for muscle spasms. 01/16/21   [provider]  doxycycline (VIBRA-TABS) 100 MG tablet Take 1 tablet (100 mg total) by mouth 2 (two) times daily. 12/11/21   Curt Bears, MD  famotidine (PEPCID) 40 MG tablet Take 1 tablet (40 mg total) by mouth at bedtime. 03/28/22   Collene Gobble, MD  fluticasone (FLONASE) 50 MCG/ACT nasal spray Place 1 spray into both nostrils daily. Patient taking differently: Place 1 spray into both nostrils daily as needed for allergies. 04/06/21   Cobb, Karie Schwalbe, NP  furosemide (LASIX) 20 MG tablet TAKE 1 TABLET BY MOUTH DAILY 03/05/22   Jettie Booze, MD  guaiFENesin (MUCINEX) 600 MG 12 hr  tablet Take 1 tablet (600 mg total) by mouth 2 (two) times daily. 04/06/21   Cobb, Karie Schwalbe, NP  Influenza Vac High-Dose Quad (FLUZONE HIGH-DOSE QUADRIVALENT) 0.7 ML [facility] ADMINISTERED    [provider]  isosorbide mononitrate (IMDUR) 60 MG 24 hr tablet Take 1 tablet (60 mg total) by mouth 2 (two) times daily. 10/29/21   Jettie Booze, MD  lisinopril (ZESTRIL) 10 MG tablet TAKE 1 TABLET BY MOUTH DAILY 03/05/22   Jettie Booze, MD  loratadine (CLARITIN) 10 MG tablet Take 1 tablet (10 mg total) by mouth daily. Patient taking differently: Take 10 mg by mouth daily as needed for allergies. 04/06/21   Cobb, Karie Schwalbe, NP  losartan (COZAAR) 50 MG tablet Take 1 tablet (50 mg total) by mouth daily. 03/28/22   Collene Gobble, MD  naloxone [facility]) nasal spray 4 mg/0.1 mL SMARTSIG:Both Nares 02/26/22   [provider]  nitroGLYCERIN (NITROSTAT) 0.4 MG SL tablet Place 1 tablet (0.4 mg total) under the tongue every 5 (five) minutes as needed for chest pain (X3 DOSES MAX). 02/16/21   Bhagat, Crista Luria, PA  oxyCODONE-acetaminophen (PERCOCET) 7.5-325 MG tablet Take 1-2 tablets by mouth every 4 (four) hours as needed for moderate pain (back pain).    [provider]  pregabalin (LYRICA) 50 MG capsule Take 50 mg by mouth at bedtime as needed (leg pain). 01/16/21   [provider]  Tiotropium Bromide-Olodaterol (STIOLTO RESPIMAT) 2.5-2.5 MCG/ACT AERS Inhale 2 puffs into the lungs daily. 03/28/22   Collene Gobble, MD    ___________________________________________________________________________________________________ Physical Exam:    05/05/2022    6:15 PM 05/05/2022    5:00 PM 05/05/2022    4:45 PM  Vitals with BMI  Systolic 053 94 976  Diastolic 54 57 64  Pulse 70 74 77     1. General:  in No  Acute distress    Chronically ill   -appearing 2. Psychological: Alert and   Oriented 3. Head/ENT:   Dry Mucous Membranes                          Head  Non traumatic, neck supple                         Poor Dentition 4. SKIN:  decreased Skin turgor,  Skin clean Dry and intact no rash 5. Heart: Regular rate and rhythm no  Murmur, no Rub or gallop 6. Lungs: no wheezes or crackles   7. Abdomen: Soft,  non-tender, Non distended  bowel sounds present 8. Lower extremities: no clubbing, cyanosis, no  edema 9. Neurologically Grossly intact,  moving all 4 extremities equally   10. MSK: Normal range of motion    Chart has been reviewed  ______________________________________________________________________________________________  Assessment/Plan  77 y.o. 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 Earlie Server stable  Lower GI bleed - Suspect Lower Gi source  No hx of PUD, melena, BUN elevation to  suggest otherwise  - Admit  For further management given:  Age >60 years, comorbid illnesses  ,  gross rectal bleeding,  rebleeding exposure to antiplatelet drugs a    -  most likely Diverticular source  CTA showing source of active bleeding pt dropped pressure  IR has been consulted plans to see pt tonight  PCCM is also aware in case pt decompensates   hemodynamic instability present      -  Admit to progressive given above    -  ER  Provider spoke to gastroenterology ( [facility]) they will see patient in a.m. appreciate their consult   - serial CBC.    - Monitor for any recurrence,   evidence of hemodynamic instability or significant blood loss -  type and screen,  - Transfuse  evidence of significant  bleeding  - Establish at least 2 PIV and fluid resuscitate   -   nothing by mouth post midnight,  -  monitor for Recurrent significant  Bleeding  Hold plavix   Prolonged QT interval - will monitor on tele avoid QT prolonging medications, rehydrate correct electrolytes   Tobacco use  - Spoke about importance of quitting spent 5 minutes discussing options for treatment, prior attempts at quitting, and dangers of smoking  -At this point patient is    interested in quitting  - order nicotine patch   - nursing tobacco cessation protocol   Essential hypertension Given soft blood pressures we will hold off on home medications  Coronary artery disease involving native heart with angina pectoris (HCC) Hold Plavix Hold metoprolol for tonight given soft blood pressures  VENTRICULAR FIBRILLATION Status post AICD followed by cardiology  Stage 2 moderate COPD by GOLD classification ([place]) Chronic stable continue albuterol as needed noncontributory  Chest pain Chronic stable patient states is no different from prior. No evidence of ischemia on the EKG. Resume Imdur if blood pressure tolerates once patient is more stable    Other plan as per orders.  DVT prophylaxis:  SCD       Code Status:    Code Status: Prior FULL CODE  as per patient   I had personally discussed CODE STATUS with patient and family    Family Communication:   Family  at  Bedside  plan of care was discussed  with  Daughter,   Disposition Plan:      Following barriers for discharge:  Anemia corrected                                                       Will need consultants to evaluate patient prior to discharge   Consults called: [facility], IR, PCCM is aware    Admission status:  ED Disposition     ED Disposition  Admit   Condition  --    Comment  The patient appears reasonably stabilized for admission considering the current resources, flow, and capabilities available in the ED at this time, and I doubt any other [facility] requiring further screening and/or treatment in the ED prior to admission is  present.            inpatient     I Expect 2 midnight stay secondary to severity of patient's current illness need for inpatient interventions justified by the following:  hemodynamic instability despite optimal treatment (  hypotension  )  Severe lab/radiological/exam abnormalities including:    Lower Gi bleed and extensive comorbidities including:      CAD   COPD/asthma    That are currently affecting medical management.   I expect  patient to be hospitalized for 2 midnights requiring inpatient medical care.  Patient is at high risk for adverse outcome (such as loss of life or disability) if not treated.  Indication for inpatient stay as follows:    Need for operative/procedural  intervention     Need for   IV fluids, IV PPI   Level of care     progressive tele indefinitely please discontinue once patient no longer qualifies COVID-19 Labs    Lab Results  Component Value Date   [place] 05/05/2022     Precautions: admitted as   Covid Negative   Saundra Gin 05/05/2022, 10:34 PM    [facility]     after 2 AM please page floor coverage PA If 7AM-7PM, please contact the day team taking care of the patient using [email or web address]   Patient was evaluated in the context of the global COVID-19 pandemic, which necessitated consideration that the patient might be at risk for infection with the SARS-CoV-2 virus that causes COVID-19. Institutional protocols and algorithms that pertain to the evaluation of patients at risk for COVID-19 are in a state of rapid change based on information released by regulatory bodies including the CDC and federal and state organizations. These policies and  algorithms were followed during the patient's care.

## 2022-05-05 NOTE — ED Notes (Signed)
I attempted to start the iv fluid ordered but c-t showed up for her test

## 2022-05-05 NOTE — Assessment & Plan Note (Signed)
Followed by Dr. Earlie Server stable

## 2022-05-05 NOTE — ED Notes (Signed)
NT accompanied pt to restroom where pt passed a blood clot. Daughter at bedside confirmed to NT that she had seen this as well. Nurse notified.

## 2022-05-05 NOTE — Assessment & Plan Note (Signed)
Chronic stable patient states is no different from prior. No evidence of ischemia on the EKG. Resume Imdur if blood pressure tolerates once patient is more stable

## 2022-05-06 DIAGNOSIS — K922 Gastrointestinal hemorrhage, unspecified: Secondary | ICD-10-CM | POA: Diagnosis not present

## 2022-05-06 HISTORY — PX: IR ANGIOGRAM VISCERAL SELECTIVE: IMG657

## 2022-05-06 HISTORY — PX: IR US GUIDE VASC ACCESS RIGHT: IMG2390

## 2022-05-06 HISTORY — PX: IR AORTAGRAM ABDOMINAL SERIALOGRAM: IMG636

## 2022-05-06 HISTORY — PX: IR EMBO ART  VEN HEMORR LYMPH EXTRAV  INC GUIDE ROADMAPPING: IMG5450

## 2022-05-06 LAB — CBC
HCT: 36.8 % (ref 36.0–46.0)
HCT: 31.2 % — ABNORMAL LOW (ref 36.0–46.0)
HCT: 30.6 % — ABNORMAL LOW (ref 36.0–46.0)
HCT: 31.5 % — ABNORMAL LOW (ref 36.0–46.0)
Hemoglobin: 11.5 g/dL — ABNORMAL LOW (ref 12.0–15.0)
Hemoglobin: 10.1 g/dL — ABNORMAL LOW (ref 12.0–15.0)
Hemoglobin: 10.2 g/dL — ABNORMAL LOW (ref 12.0–15.0)
Hemoglobin: 10.7 g/dL — ABNORMAL LOW (ref 12.0–15.0)
MCH: 31.9 pg (ref 26.0–34.0)
MCH: 31.6 pg (ref 26.0–34.0)
MCH: 32.1 pg (ref 26.0–34.0)
MCH: 32 pg (ref 26.0–34.0)
MCHC: 31.3 g/dL (ref 30.0–36.0)
MCHC: 32.4 g/dL (ref 30.0–36.0)
MCHC: 33.3 g/dL (ref 30.0–36.0)
MCHC: 34 g/dL (ref 30.0–36.0)
MCV: 102.2 fL — ABNORMAL HIGH (ref 80.0–100.0)
MCV: 97.5 fL (ref 80.0–100.0)
MCV: 96.2 fL (ref 80.0–100.0)
MCV: 94.3 fL (ref 80.0–100.0)
Platelets: 142 10*3/uL — ABNORMAL LOW (ref 150–400)
Platelets: 132 10*3/uL — ABNORMAL LOW (ref 150–400)
Platelets: 108 10*3/uL — ABNORMAL LOW (ref 150–400)
Platelets: 129 10*3/uL — ABNORMAL LOW (ref 150–400)
RBC: 3.6 MIL/uL — ABNORMAL LOW (ref 3.87–5.11)
RBC: 3.2 MIL/uL — ABNORMAL LOW (ref 3.87–5.11)
RBC: 3.18 MIL/uL — ABNORMAL LOW (ref 3.87–5.11)
RBC: 3.34 MIL/uL — ABNORMAL LOW (ref 3.87–5.11)
RDW: 12.7 % (ref 11.5–15.5)
RDW: 12.9 % (ref 11.5–15.5)
RDW: 12.8 % (ref 11.5–15.5)
RDW: 12.8 % (ref 11.5–15.5)
WBC: 6.9 10*3/uL (ref 4.0–10.5)
WBC: 7.2 10*3/uL (ref 4.0–10.5)
WBC: 6.9 10*3/uL (ref 4.0–10.5)
WBC: 7.6 10*3/uL (ref 4.0–10.5)
nRBC: 0 % (ref 0.0–0.2)
nRBC: 0 % (ref 0.0–0.2)
nRBC: 0 % (ref 0.0–0.2)
nRBC: 0 % (ref 0.0–0.2)

## 2022-05-06 LAB — BPAM RBC
Blood Product Expiration Date: 202401262359
ISSUE DATE / TIME: 202401072157
Unit Type and Rh: 8400

## 2022-05-06 LAB — IRON AND TIBC
Iron: 177 ug/dL — ABNORMAL HIGH (ref 28–170)
Saturation Ratios: 71 % — ABNORMAL HIGH (ref 10.4–31.8)
TIBC: 251 ug/dL (ref 250–450)
UIBC: 74 ug/dL

## 2022-05-06 LAB — FOLATE: Folate: 12.4 ng/mL (ref >5.9)

## 2022-05-06 LAB — TYPE AND SCREEN
ABO/RH(D): AB POS
Antibody Screen: NEGATIVE
Unit division: 0

## 2022-05-06 LAB — COMPREHENSIVE METABOLIC PANEL
ALT: 10 U/L (ref 0–44)
AST: 14 U/L — ABNORMAL LOW (ref 15–41)
Albumin: 3.1 g/dL — ABNORMAL LOW (ref 3.5–5.0)
Alkaline Phosphatase: 52 U/L (ref 38–126)
Anion gap: 7 (ref 5–15)
BUN: 17 mg/dL (ref 8–23)
CO2: 18 mmol/L — ABNORMAL LOW (ref 22–32)
Calcium: 8.2 mg/dL — ABNORMAL LOW (ref 8.9–10.3)
Chloride: 111 mmol/L (ref 98–111)
Creatinine, Ser: 1.12 mg/dL — ABNORMAL HIGH (ref 0.44–1.00)
GFR, Estimated: 51 mL/min — ABNORMAL LOW (ref >60)
Glucose, Bld: 103 mg/dL — ABNORMAL HIGH (ref 70–99)
Potassium: 4 mmol/L (ref 3.5–5.1)
Sodium: 136 mmol/L (ref 135–145)
Total Bilirubin: 0.8 mg/dL (ref 0.3–1.2)
Total Protein: 4.8 g/dL — ABNORMAL LOW (ref 6.5–8.1)

## 2022-05-06 LAB — VITAMIN B12: Vitamin B-12: 182 pg/mL (ref 180–914)

## 2022-05-06 LAB — MAGNESIUM: Magnesium: 1.7 mg/dL (ref 1.7–2.4)

## 2022-05-06 LAB — PREALBUMIN: Prealbumin: 22 mg/dL (ref 18–38)

## 2022-05-06 LAB — FERRITIN: Ferritin: 108 ng/mL (ref 11–307)

## 2022-05-06 LAB — MRSA NEXT GEN BY PCR, NASAL: MRSA by PCR Next Gen: NOT DETECTED

## 2022-05-06 LAB — PHOSPHORUS: Phosphorus: 3.4 mg/dL (ref 2.5–4.6)

## 2022-05-06 LAB — TSH: TSH: 1.004 u[IU]/mL (ref 0.350–4.500)

## 2022-05-06 LAB — TROPONIN I (HIGH SENSITIVITY): Troponin I (High Sensitivity): 3 ng/L (ref <18)

## 2022-05-06 MED ORDER — ALBUTEROL SULFATE HFA 108 (90 BASE) MCG/ACT IN AERS: 2.0000 | INHALATION_SPRAY | Freq: Four times a day (QID) | RESPIRATORY_TRACT | Status: DC | PRN

## 2022-05-06 MED ORDER — UMECLIDINIUM BROMIDE 62.5 MCG/ACT IN AEPB
1.0000 | INHALATION_SPRAY | Freq: Every day | RESPIRATORY_TRACT | Status: DC
  Administered 2022-05-08 – 2022-05-16 (×8): 1 via RESPIRATORY_TRACT
  Filled 2022-05-06 (×3): qty 7

## 2022-05-06 MED ORDER — ACETAMINOPHEN 325 MG PO TABS
650.0000 mg | ORAL_TABLET | Freq: Four times a day (QID) | ORAL | Status: DC | PRN
  Administered 2022-05-06 – 2022-05-07 (×3): 650 mg via ORAL
  Filled 2022-05-06 (×3): qty 2

## 2022-05-06 MED ORDER — MAGNESIUM SULFATE 2 GM/50ML IV SOLN
2.0000 g | Freq: Once | INTRAVENOUS | Status: AC
  Administered 2022-05-06: 2 g via INTRAVENOUS
  Filled 2022-05-06: qty 50

## 2022-05-06 MED ORDER — CYANOCOBALAMIN 1000 MCG/ML IJ SOLN
1000.0000 ug | Freq: Every day | INTRAMUSCULAR | Status: AC
  Administered 2022-05-06 – 2022-05-12 (×5): 1000 ug via INTRAMUSCULAR
  Filled 2022-05-06 (×7): qty 1

## 2022-05-06 MED ORDER — ARFORMOTEROL TARTRATE 15 MCG/2ML IN NEBU
15.0000 ug | INHALATION_SOLUTION | Freq: Two times a day (BID) | RESPIRATORY_TRACT | Status: DC
  Administered 2022-05-07 – 2022-05-16 (×19): 15 ug via RESPIRATORY_TRACT
  Filled 2022-05-06 (×20): qty 2

## 2022-05-06 NOTE — Progress Notes (Signed)
[facility] PROGRESS NOTE  Karen [name] (DOB: [birth year]) FVC:[id number] PCP: [facility]  Brief Narrative: Karen [name] is a 77 y.o. 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 Hemoglobin down from normal 12.9 to 10g/dl and subsequently 8.8 with active bleeding identified on CT angiogram. Underwent embolization of the distal left colic/sigmoid branch artery with resolution of bleeding. Hemoglobin has stabilized.   Subjective: No further BMs. Denies abdominal pain currently.  Objective: BP 139/60 (BP Location: Right Arm)   Pulse 83   Temp 98.3 F (36.8 C) (Oral)   Resp (!) 22   SpO2 94%   Gen: No distress Pulm: Clear, nonlabored, slightly distant  CV: RRR, no MRG or pitting edema GI: Soft, NT, ND, +BS  Neuro: Alert and oriented. No new focal deficits. Ext: Warm, no deformities Skin: No rashes, lesions or ulcers on visualized skin   Assessment & Plan: Principal Problem:   Lower GI bleed Active Problems:   Adenocarcinoma, lung (HCC)   Tobacco use   Essential hypertension   Coronary artery disease involving native heart with angina pectoris (HCC)   VENTRICULAR FIBRILLATION   Stage 2 moderate COPD by GOLD classification (HCC)   Chest pain   Prolonged QT interval  Acute blood loss anemia due to diverticular GI bleed:  - s/p 1u PRBC 1/7. Hgb baseline seems to be normal, last value 12.9g/dl. Has rebounded appropriately. - IR available as needed, s/p distal left colic/sigmoid artery branch vessel embolization by Dr. Maryelizabeth Kaufmann.  - Will continue holding antiplatelet plavix - Monitor for bleeding, serial H/H with up to date type and screen - CLD per GI.   Thrombocytopenia: New. - Of course, holding anticoagulation. SCDs.  - Trending CBC instead of just H/H  CAD, hx CABG: Troponin is normal. Remarkably low given the GI bleed,  anemia.  - Hold antiplatelets - Holding metoprolol, imdur for now.  - Holding statin  History of CHF with recovered EF, HTN: LVEF 50-55%.  - Euvolemic, hold lasix and further IVF. - Reassess antihypertensives in AM. BP currently wnl.  Hx VF arrest s/p AICD. K is 4.0, Mg 1.7 - Supp Mg  COPD: Quiescent.  - Continue controller meds (formulary equivalent) and prn albuterol  History of left lung CA s/p SBRT 2022.   Stage IIIb CKD: Based on baseline CrCl 30-33ml/min.   HLD:  - Statin  Vitamin B12 deficiency: Marginal B12 level and macrocytosis noted.  - Supplement  Tobacco use:  - Cessation counseling provided - Nicotine patch  GERD, chronic dysphagia:  - Continue with plans for EGD with [facility].    Patrecia Pour, MD [facility] [email or web address] 05/06/2022, 5:13 PM

## 2022-05-06 NOTE — ED Notes (Signed)
ED TO INPATIENT HANDOFF REPORT  ED Nurse Name and Phone #: Lorane Gell, [phone]  S Name/Age/Gender Karen [name] 77 y.o. female Room/Bed: 001C/001C  Code Status   Code Status: Full Code  Home/SNF/Other Home Patient oriented to: self, place, time, and situation Is this baseline? Yes   Triage Complete: Triage complete  Chief Complaint Lower GI bleed [K92.2]  Triage Note Pt BIB [facility] from home c/o some pressure in the RLQ and LLQ. Pt states she had 2 diarrhea episodes today. The first one she made it to the bathroom and paid no attention to it. Then she had a 2nd one where she could not make it to the bathroom and noticed bright red blood in the diarrhea. Pt is on Eliquis.    Allergies Allergies  Allergen Reactions   Codeine Itching and Swelling   Novocain [Procaine] Hives and Swelling    Eyes swelling    Level of Care/Admitting Diagnosis ED Disposition     ED Disposition  Admit   Condition  --   Comment  Hospital Area: [facility] [[id number]]  Level of Care: Progressive [102]  Admit to Progressive based on following criteria: MULTISYSTEM THREATS such as stable sepsis, metabolic/electrolyte imbalance with or without encephalopathy that is responding to early treatment.  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    Past Surgical History:  Procedure Laterality Date   CARDIAC DEFIBRILLATOR PLACEMENT  03/2006   [employer]. remote-yes.    CORONARY ARTERY BYPASS GRAFT     x4   IMPLANTABLE CARDIOVERTER DEFIBRILLATOR GENERATOR CHANGE N/A 03/03/2013   Procedure: IMPLANTABLE CARDIOVERTER DEFIBRILLATOR GENERATOR CHANGE;  Surgeon: Deboraha Sprang, MD;  Location[facility] C[facility] LAB;  Service: Cardiovascular;  Laterality: N/A;   IR ANGIOGRAM VISCERAL SELECTIVE  05/06/2022   IR AORTAGRAM ABDOMINAL SERIALOGRAM  05/06/2022   IR EMBO ART  VEN HEMORR LYMPH EXTRAV  INC GUIDE ROADMAPPING  05/06/2022   IR US GUIDE VASC ACCESS RIGHT  05/06/2022   stent implant     x1. possibly x2.      A IV Location/Drains/Wounds Patient Lines/Drains/Airways Status     Active Line/Drains/Airways     Name Placement date Placement time Site Days   Peripheral IV 05/05/22 20 G 1" Anterior;Left Forearm 05/05/22  1520  Forearm  1   Peripheral IV 05/05/22 22 G Anterior;Right Forearm 05/05/22  2227  Forearm  1   Incision (Closed) 01/03/21 Back Left;Mid 01/03/21  1140  -- 488  Incision (Closed) 05/06/22 Groin Anterior;Proximal;Right 05/06/22  0000  -- less than 1            Intake/Output Last 24 hours  Intake/Output Summary (Last 24 hours) at 05/06/2022 2029 Last data filed at 05/06/2022 2025 Gross per 24 hour  Intake 1076.65 ml  Output --  Net 1076.65 ml    Labs/Imaging Results for orders placed or performed during the hospital encounter of 05/05/22 (from the past 48 hour(s))  CBC with Differential     Status: Abnormal   Collection Time: 05/05/22  1:54 PM  Result Value Ref Range    WBC 4.8 4.0 - 10.5 K/uL   RBC 3.17 (L) 3.87 - 5.11 MIL/uL   Hemoglobin 10.0 (L) 12.0 - 15.0 g/dL   HCT 32.0 (L) 36.0 - 46.0 %   MCV 100.9 (H) 80.0 - 100.0 fL   MCH 31.5 26.0 - 34.0 pg   MCHC 31.3 30.0 - 36.0 g/dL   RDW 12.3 11.5 - 15.5 %   Platelets 146 (L) 150 - 400 K/uL   nRBC 0.0 0.0 - 0.2 %   Neutrophils Relative % 56 %   Neutro Abs 2.7 1.7 - 7.7 K/uL   Lymphocytes Relative 38 %   Lymphs Abs 1.8 0.7 - 4.0 K/uL   Monocytes Relative 5 %   Monocytes Absolute 0.2 0.1 - 1.0 K/uL   Eosinophils Relative 1 %   Eosinophils Absolute 0.1 0.0 - 0.5 K/uL   Basophils Relative 0 %   Basophils Absolute 0.0 0.0 - 0.1 K/uL   Immature Granulocytes 0 %   Abs Immature Granulocytes 0.01 0.00 - 0.07 K/uL    Comment: Performed at [facility], [address], [place], [place] [address]  Comprehensive metabolic panel     Status: Abnormal   Collection Time: 05/05/22  1:54 PM  Result Value Ref Range   Sodium 140 135 - 145 mmol/L   Potassium 3.8 3.5 - 5.1 mmol/L   Chloride 106 98 - 111 mmol/L   CO2 26 22 - 32 mmol/L   Glucose, Bld 111 (H) 70 - 99 mg/dL    Comment: Glucose reference range applies only to samples taken after fasting for at least 8 hours.   BUN 12 8 - 23 mg/dL   Creatinine, Ser 1.20 (H) 0.44 - 1.00 mg/dL   Calcium 8.6 (L) 8.9 - 10.3 mg/dL   Total Protein 5.3 (L) 6.5 - 8.1 g/dL   Albumin 3.2 (L) 3.5 - 5.0 g/dL   AST 16 15 - 41 U/L   ALT 13 0 - 44 U/L   Alkaline Phosphatase 55 38 - 126 U/L   Total Bilirubin 0.6 0.3 - 1.2 mg/dL   GFR, Estimated 47 (L) >60 mL/min    Comment: (NOTE) Calculated using the CKD-EPI Creatinine Equation (2021)    Anion gap 8 5 - 15    Comment: Performed at [facility], [place] [address], [place], [place] [address]  Lipase, blood     Status: None   Collection Time: 05/05/22  1:54 PM  Result Value Ref Range   Lipase 20 11 - 51 U/L    Comment: Performed at [facility] [address], [place], [place] [address]  Troponin I (High  Sensitivity)     Status: None   Collection Time: 05/05/22  1:54 PM  Result Value Ref Range   Troponin I (High Sensitivity) <2 <18 ng/L    Comment: (NOTE) Elevated high sensitivity troponin I (hsTnI) values  and significant  changes across serial measurements may suggest ACS but many other  chronic and acute conditions are known to elevate hsTnI results.  Refer to the "Links" section for chest pain algorithms and additional  guidance. Performed at [facility], [place] [address], [place], [place] [address]   Resp panel by RT-PCR (RSV, Flu A&B, Covid) Anterior Nasal Swab     Status: None   Collection Time: 05/05/22  1:54 PM   Specimen: Anterior Nasal Swab  Result Value Ref Range   SARS Coronavirus 2 by RT PCR NEGATIVE NEGATIVE    Comment: (NOTE) SARS-CoV-2 target nucleic acids are NOT DETECTED.  The SARS-CoV-2 RNA is generally detectable in upper respiratory specimens during the acute phase of infection. The lowest concentration of SARS-CoV-2 viral copies this assay can detect is 138 copies/mL. A negative result does not preclude SARS-Cov-2 infection and should not be used as the sole basis for treatment or other patient management decisions. A negative result may occur with  improper specimen collection/handling, submission of specimen other than nasopharyngeal swab, presence of viral mutation(s) within the areas targeted by this assay, and inadequate number of viral copies(<138 copies/mL). A negative result must be combined with clinical observations, patient history, and epidemiological information. The expected result is Negative.  Fact Sheet for Patients:  EntrepreneurPulse.com.au  Fact Sheet for Healthcare Providers:  IncredibleEmployment.be  This test is no t yet approved or cleared by the [place] FDA and  has been authorized for detection and/or diagnosis of SARS-CoV-2 by FDA under an Emergency Use Authorization (EUA). This  EUA will remain  in effect (meaning this test can be used) for the duration of the COVID-19 declaration under Section 564(b)(1) of the Act, 21 U.S.C.section 360bbb-3(b)(1), unless the authorization is terminated  or revoked sooner.       Influenza A by PCR NEGATIVE NEGATIVE   Influenza B by PCR NEGATIVE NEGATIVE    Comment: (NOTE) The Xpert Xpress SARS-CoV-2/FLU/RSV plus assay is intended as an aid in the diagnosis of influenza from Nasopharyngeal swab specimens and should not be used as a sole basis for treatment. Nasal washings and aspirates are unacceptable for Xpert Xpress SARS-CoV-2/FLU/RSV testing.  Fact Sheet for Patients: EntrepreneurPulse.com.au  Fact Sheet for Healthcare Providers: IncredibleEmployment.be  This test is not yet approved or cleared by the [place] FDA and has been authorized for detection and/or diagnosis of SARS-CoV-2 by FDA under an Emergency Use Authorization (EUA). This EUA will remain in effect (meaning this test can be used) for the duration of the COVID-19 declaration under Section 564(b)(1) of the Act, 21 U.S.C. section 360bbb-3(b)(1), unless the authorization is terminated or revoked.     Resp Syncytial Virus by PCR NEGATIVE NEGATIVE    Comment: (NOTE) Fact Sheet for Patients: EntrepreneurPulse.com.au  Fact Sheet for Healthcare Providers: IncredibleEmployment.be  This test is not yet approved or cleared by the [place] FDA and has been authorized for detection and/or diagnosis of SARS-CoV-2 by FDA under an Emergency Use Authorization (EUA). This EUA will remain in effect (meaning this test can be used) for the duration of the COVID-19 declaration under Section 564(b)(1) of the Act, 21 U.S.C. section 360bbb-3(b)(1), unless the authorization is terminated or revoked.  Performed at [facility], [place] [address], [place], [place] [address]   Type and  screen [place]     Status: None   Collection Time: 05/05/22  7:04 PM  Result Value Ref Range   ABO/RH(D) AB POS  Antibody Screen NEG    Sample Expiration 05/08/2022,2359    Unit Number [id number]    Blood Component Type RED CELLS,LR    Unit division 00    Status of Unit ISSUED,FINAL    Transfusion Status OK TO TRANSFUSE    Crossmatch Result      Compatible Performed at [facility], [place] [address] [place], [place] [address]   Prepare RBC (crossmatch)     Status: None   Collection Time: 05/05/22  8:49 PM  Result Value Ref Range   Order Confirmation      ORDER PROCESSED BY BLOOD BANK Performed at [facility], [place] [address] [place], [place] [address]   CBC     Status: Abnormal   Collection Time: 05/05/22 10:01 PM  Result Value Ref Range   WBC 5.2 4.0 - 10.5 K/uL   RBC 2.78 (L) 3.87 - 5.11 MIL/uL   Hemoglobin 8.8 (L) 12.0 - 15.0 g/dL   HCT 27.6 (L) 36.0 - 46.0 %   MCV 99.3 80.0 - 100.0 fL   MCH 31.7 26.0 - 34.0 pg   MCHC 31.9 30.0 - 36.0 g/dL   RDW 12.4 11.5 - 15.5 %   Platelets 142 (L) 150 - 400 K/uL   nRBC 0.0 0.0 - 0.2 %    Comment: Performed at [facility], [place] [address], [place], [place] [address]  Reticulocytes     Status: Abnormal   Collection Time: 05/05/22 10:01 PM  Result Value Ref Range   Retic Ct Pct 2.1 0.4 - 3.1 %   RBC. 2.73 (L) 3.87 - 5.11 MIL/uL   Retic Count, Absolute 56.0 19.0 - 186.0 K/uL   Immature Retic Fract 6.9 2.3 - 15.9 %    Comment: Performed at [facility] [address] [place], [place] [address]  CBC     Status: Abnormal   Collection Time: 05/06/22  3:17 AM  Result Value Ref Range   WBC 6.9 4.0 - 10.5 K/uL   RBC 3.60 (L) 3.87 - 5.11 MIL/uL   Hemoglobin 11.5 (L) 12.0 - 15.0 g/dL    Comment: REPEATED TO VERIFY POST TRANSFUSION SPECIMEN    HCT 36.8 36.0 - 46.0 %   MCV 102.2 (H) 80.0 - 100.0 fL   MCH 31.9 26.0 - 34.0 pg   MCHC 31.3 30.0 - 36.0 g/dL   RDW 12.7 11.5 - 15.5 %    Platelets 142 (L) 150 - 400 K/uL   nRBC 0.0 0.0 - 0.2 %    Comment: Performed at [facility], [place] [address] [place], [place] [address]  Vitamin B12     Status: None   Collection Time: 05/06/22  3:17 AM  Result Value Ref Range   Vitamin B-12 182 180 - 914 pg/mL    Comment: (NOTE) This assay is not validated for testing neonatal or myeloproliferative syndrome specimens for Vitamin B12 levels. Performed at [facility], [place] [address], [place], [place] [address]   Comprehensive metabolic panel     Status: Abnormal   Collection Time: 05/06/22  3:17 AM  Result Value Ref Range   Sodium 136 135 - 145 mmol/L   Potassium 4.0 3.5 - 5.1 mmol/L   Chloride 111 98 - 111 mmol/L   CO2 18 (L) 22 - 32 mmol/L   Glucose, Bld 103 (H) 70 - 99 mg/dL    Comment: Glucose reference range applies only to samples taken after fasting for at least  8 hours.   BUN 17 8 - 23 mg/dL   Creatinine, Ser 1.12 (H) 0.44 - 1.00 mg/dL   Calcium 8.2 (L) 8.9 - 10.3 mg/dL   Total Protein 4.8 (L) 6.5 - 8.1 g/dL   Albumin 3.1 (L) 3.5 - 5.0 g/dL   AST 14 (L) 15 - 41 U/L   ALT 10 0 - 44 U/L   Alkaline Phosphatase 52 38 - 126 U/L   Total Bilirubin 0.8 0.3 - 1.2 mg/dL   GFR, Estimated 51 (L) >60 mL/min    Comment: (NOTE) Calculated using the CKD-EPI Creatinine Equation (2021)    Anion gap 7 5 - 15    Comment: Performed at [facility], [place] [address] [place], [place] [address]  Magnesium     Status: None   Collection Time: 05/06/22  3:17 AM  Result Value Ref Range   Magnesium 1.7 1.7 - 2.4 mg/dL    Comment: Performed at [facility] [address], [place], [place] [address]  Phosphorus     Status: None   Collection Time: 05/06/22  3:17 AM  Result Value Ref Range   Phosphorus 3.4 2.5 - 4.6 mg/dL    Comment: Performed at [facility] [address], [place], [place] [address]  Prealbumin     Status: None   Collection Time: 05/06/22  3:17 AM  Result Value Ref Range   Prealbumin 22 18  - 38 mg/dL    Comment: Performed at [facility] [address] [place], [place] [address]  Iron and TIBC     Status: Abnormal   Collection Time: 05/06/22  3:17 AM  Result Value Ref Range   Iron 177 (H) 28 - 170 ug/dL   TIBC 251 250 - 450 ug/dL   Saturation Ratios 71 (H) 10.4 - 31.8 %   UIBC 74 ug/dL    Comment: Performed at [facility], [place] [address] [place], [place] [address]  Ferritin     Status: None   Collection Time: 05/06/22  3:17 AM  Result Value Ref Range   Ferritin 108 11 - 307 ng/mL    Comment: Performed at [facility], [place] [name] [place], [place] [address]  Folate     Status: None   Collection Time: 05/06/22  3:17 AM  Result Value Ref Range   Folate 12.4 >5.9 ng/mL    Comment: Performed at [facility] [address] [place], [place] [address]  Troponin I (High Sensitivity)     Status: None   Collection Time: 05/06/22  3:17 AM  Result Value Ref Range   Troponin I (High Sensitivity) 3 <18 ng/L    Comment: (NOTE) Elevated high sensitivity troponin I (hsTnI) values and significant  changes across serial measurements may suggest ACS but many other  chronic and acute conditions are known to elevate hsTnI results.  Refer to the "Links" section for chest pain algorithms and additional  guidance. Performed at [facility], [place] [address], [place], [place] [address]   TSH     Status: None   Collection Time: 05/06/22  3:17 AM  Result Value Ref Range   TSH 1.004 0.350 - 4.500 uIU/mL    Comment: Performed by a 3rd Generation assay with a functional sensitivity of <=0.01 uIU/mL. Performed at [facility], [place] [address], [place], [place] [address]   CBC     Status: Abnormal   Collection Time: 05/06/22  11:51 AM  Result Value Ref Range   WBC 7.2 4.0 - 10.5 K/uL   RBC 3.20 (L) 3.87 - 5.11 MIL/uL   Hemoglobin 10.1 (L) 12.0 - 15.0 g/dL   HCT 31.2 (L) 36.0 - 46.0 %   MCV 97.5 80.0 - 100.0 fL   MCH 31.6 26.0 - 34.0 pg   MCHC  32.4 30.0 - 36.0 g/dL   RDW 12.9 11.5 - 15.5 %   Platelets 132 (L) 150 - 400 K/uL    Comment: REPEATED TO VERIFY   nRBC 0.0 0.0 - 0.2 %    Comment: Performed at [facility], [place] [address] [place], [place] [address]  CBC     Status: Abnormal   Collection Time: 05/06/22  3:00 PM  Result Value Ref Range   WBC 6.9 4.0 - 10.5 K/uL   RBC 3.18 (L) 3.87 - 5.11 MIL/uL   Hemoglobin 10.2 (L) 12.0 - 15.0 g/dL   HCT 30.6 (L) 36.0 - 46.0 %   MCV 96.2 80.0 - 100.0 fL   MCH 32.1 26.0 - 34.0 pg   MCHC 33.3 30.0 - 36.0 g/dL   RDW 12.8 11.5 - 15.5 %   Platelets 108 (L) 150 - 400 K/uL    Comment: REPEATED TO VERIFY   nRBC 0.0 0.0 - 0.2 %    Comment: Performed at [facility], [place] [address] [place], [place] [address]   IR EMBO ART  VEN HEMORR LYMPH EXTRAV  INC GUIDE ROADMAPPING  Result Date: 05/06/2022 INDICATION: Hematochezia.  Hypotension. EXAM: Procedures: 1. ABDOMINAL AORTOGRAPHY 2. INFERIOR MESENTERIC ARTERY ARTERIOGRAPHY 3. COIL EMBOLIZATION OF ACTIVE EXTRAVASATION AT DISTAL DESCENDING COLON / PROXIMAL SIGMOID COLON COMPARISON:  CT AP, earlier same day. MEDICATIONS: Benadryl 25 mg IV.  Ancef 2 g IV. ANESTHESIA/SEDATION: Moderate (conscious) sedation was employed during this procedure. A total of Versed 2 mg and Fentanyl 100 mcg was administered intravenously. Moderate Sedation Time: 85 minutes. The patient's level of consciousness and vital signs were monitored continuously by radiology nursing throughout the procedure under my direct supervision. CONTRAST:  65 mL Omnipaque 300 FLUOROSCOPY TIME:  Fluoroscopic dose; 332 mGy COMPLICATIONS: None immediate. PROCEDURE: Informed consent was obtained from the patient and/or patient's representative following explanation of the procedure, risks, benefits and alternatives. All questions were addressed. A time out was performed prior to the initiation of the procedure. Maximal barrier sterile technique utilized including caps, mask, sterile gowns,  sterile gloves, large sterile drape, hand hygiene, and Betadine prep. The RIGHT femoral head was marked fluoroscopically. Under sterile conditions and local anesthesia, the RIGHT common femoral artery access was performed with a micropuncture needle. Under direct ultrasound guidance, the RIGHT common femoral was accessed with a micropuncture kit. An ultrasound image was saved for documentation purposes. This allowed for placement of a 5 Fr vascular sheath. A limited arteriogram was performed through the side arm of the sheath confirming appropriate access within the RIGHT common femoral artery. The short sheath was then exchanged for a 5 Fr, 35 cm Brite tip for additional stability. A limited abdominal aortogram was performed to help identify the IMA origin. Over a Bentson wire, a Sos catheter was advanced, reformed, back bled and flushed. The catheter was then utilized to select the inferior mesenteric artery and a selective inferior mesenteric arteriogram was performed. Using a 2.5 Fr lantern microcatheter and microwire, access into the distal LEFT colic/sigmoid artery branch vessels was performed and selective arteriography was obtained to identify the bleeding vessel. Super selective  coil embolization was then performed using multiple non detachable micro coils. A post embolization arteriogram was performed without evidence of residual extravasation. Images were reviewed and the procedure was terminated. All wires, catheters and sheaths were removed from the patient. Hemostasis was achieved at the RIGHT groin access site with Angio-Seal closure device. The patient tolerated the procedure well without immediate post procedural complication. FINDINGS: - access via the RIGHT femoral artery. - Focus of active extravasation from IMA territory, distal LEFT colic / sigmoid artery branch vessel - Successful embolization without residual extravasation - Angio-Seal closure at RIGHT groin. Palpable RLE pulses at the end of  the Case IMPRESSION: Successful super-selective coil embolization of focus of active extravasation from IMA territory, distal LEFT colic/sigmoid artery branch vessel, as above. PLAN: - The patient is to remain flat for 2 hours with RIGHT leg straight. - The patient may continue to experience several additional bloody bowel movements however should stabilize in the coming days. Michaelle Birks, MD [facility] Radiology Electronically Signed   By: Michaelle Birks M.D.   On: 05/06/2022 00:41   IR US Guide Vasc Access Right  Result Date: 05/06/2022 INDICATION: Hematochezia.  Hypotension. EXAM: Procedures: 1. ABDOMINAL AORTOGRAPHY 2. INFERIOR MESENTERIC ARTERY ARTERIOGRAPHY 3. COIL EMBOLIZATION OF ACTIVE EXTRAVASATION AT DISTAL DESCENDING COLON / PROXIMAL SIGMOID COLON COMPARISON:  CT AP, earlier same day. MEDICATIONS: Benadryl 25 mg IV.  Ancef 2 g IV. ANESTHESIA/SEDATION: Moderate (conscious) sedation was employed during this procedure. A total of Versed 2 mg and Fentanyl 100 mcg was administered intravenously. Moderate Sedation Time: 85 minutes. The patient's level of consciousness and vital signs were monitored continuously by radiology nursing throughout the procedure under my direct supervision. CONTRAST:  65 mL Omnipaque 300 FLUOROSCOPY TIME:  Fluoroscopic dose; 952 mGy COMPLICATIONS: None immediate. PROCEDURE: Informed consent was obtained from the patient and/or patient's representative following explanation of the procedure, risks, benefits and alternatives. All questions were addressed. A time out was performed prior to the initiation of the procedure. Maximal barrier sterile technique utilized including caps, mask, sterile gowns, sterile gloves, large sterile drape, hand hygiene, and Betadine prep. The RIGHT femoral head was marked fluoroscopically. Under sterile conditions and local anesthesia, the RIGHT common femoral artery access was performed with a  micropuncture needle. Under direct ultrasound guidance, the RIGHT common femoral was accessed with a micropuncture kit. An ultrasound image was saved for documentation purposes. This allowed for placement of a 5 Fr vascular sheath. A limited arteriogram was performed through the side arm of the sheath confirming appropriate access within the RIGHT common femoral artery. The short sheath was then exchanged for a 5 Fr, 35 cm Brite tip for additional stability. A limited abdominal aortogram was performed to help identify the IMA origin. Over a Bentson wire, a Sos catheter was advanced, reformed, back bled and flushed. The catheter was then utilized to select the inferior mesenteric artery and a selective inferior mesenteric arteriogram was performed. Using a 2.5 Fr lantern microcatheter and microwire, access into the distal LEFT colic/sigmoid artery branch vessels was performed and selective arteriography was obtained to identify the bleeding vessel. Super selective coil embolization was then performed using multiple non detachable micro coils. A post embolization arteriogram was performed without evidence of residual extravasation. Images were reviewed and the procedure was terminated. All wires, catheters and sheaths were removed from the patient. Hemostasis was achieved at the RIGHT groin access site with Angio-Seal closure device. The patient tolerated the procedure well without immediate post  procedural complication. FINDINGS: - access via the RIGHT femoral artery. - Focus of active extravasation from IMA territory, distal LEFT colic / sigmoid artery branch vessel - Successful embolization without residual extravasation - Angio-Seal closure at RIGHT groin. Palpable RLE pulses at the end of the Case IMPRESSION: Successful super-selective coil embolization of focus of active extravasation from IMA territory, distal LEFT colic/sigmoid artery branch vessel, as above. PLAN: - The patient is to remain flat for 2 hours  with RIGHT leg straight. - The patient may continue to experience several additional bloody bowel movements however should stabilize in the coming days. Michaelle Birks, MD [facility] Radiology Electronically Signed   By: Michaelle Birks M.D.   On: 05/06/2022 00:41   IR Angiogram Visceral Selective  Result Date: 05/06/2022 INDICATION: Hematochezia.  Hypotension. EXAM: Procedures: 1. ABDOMINAL AORTOGRAPHY 2. INFERIOR MESENTERIC ARTERY ARTERIOGRAPHY 3. COIL EMBOLIZATION OF ACTIVE EXTRAVASATION AT DISTAL DESCENDING COLON / PROXIMAL SIGMOID COLON COMPARISON:  CT AP, earlier same day. MEDICATIONS: Benadryl 25 mg IV.  Ancef 2 g IV. ANESTHESIA/SEDATION: Moderate (conscious) sedation was employed during this procedure. A total of Versed 2 mg and Fentanyl 100 mcg was administered intravenously. Moderate Sedation Time: 85 minutes. The patient's level of consciousness and vital signs were monitored continuously by radiology nursing throughout the procedure under my direct supervision. CONTRAST:  65 mL Omnipaque 300 FLUOROSCOPY TIME:  Fluoroscopic dose; 983 mGy COMPLICATIONS: None immediate. PROCEDURE: Informed consent was obtained from the patient and/or patient's representative following explanation of the procedure, risks, benefits and alternatives. All questions were addressed. A time out was performed prior to the initiation of the procedure. Maximal barrier sterile technique utilized including caps, mask, sterile gowns, sterile gloves, large sterile drape, hand hygiene, and Betadine prep. The RIGHT femoral head was marked fluoroscopically. Under sterile conditions and local anesthesia, the RIGHT common femoral artery access was performed with a micropuncture needle. Under direct ultrasound guidance, the RIGHT common femoral was accessed with a micropuncture kit. An ultrasound image was saved for documentation purposes. This allowed for placement of a 5 Fr vascular sheath. A  limited arteriogram was performed through the side arm of the sheath confirming appropriate access within the RIGHT common femoral artery. The short sheath was then exchanged for a 5 Fr, 35 cm Brite tip for additional stability. A limited abdominal aortogram was performed to help identify the IMA origin. Over a Bentson wire, a Sos catheter was advanced, reformed, back bled and flushed. The catheter was then utilized to select the inferior mesenteric artery and a selective inferior mesenteric arteriogram was performed. Using a 2.5 Fr lantern microcatheter and microwire, access into the distal LEFT colic/sigmoid artery branch vessels was performed and selective arteriography was obtained to identify the bleeding vessel. Super selective coil embolization was then performed using multiple non detachable micro coils. A post embolization arteriogram was performed without evidence of residual extravasation. Images were reviewed and the procedure was terminated. All wires, catheters and sheaths were removed from the patient. Hemostasis was achieved at the RIGHT groin access site with Angio-Seal closure device. The patient tolerated the procedure well without immediate post procedural complication. FINDINGS: - access via the RIGHT femoral artery. - Focus of active extravasation from IMA territory, distal LEFT colic / sigmoid artery branch vessel - Successful embolization without residual extravasation - Angio-Seal closure at RIGHT groin. Palpable RLE pulses at the end of the Case IMPRESSION: Successful super-selective coil embolization of focus of active extravasation from IMA territory, distal LEFT colic/sigmoid artery  branch vessel, as above. PLAN: - The patient is to remain flat for 2 hours with RIGHT leg straight. - The patient may continue to experience several additional bloody bowel movements however should stabilize in the coming days. Michaelle Birks, MD [facility]  Radiology Electronically Signed   By: Michaelle Birks M.D.   On: 05/06/2022 00:41   IR Aortagram Abdominal Serialogram  Result Date: 05/06/2022 INDICATION: Hematochezia.  Hypotension. EXAM: Procedures: 1. ABDOMINAL AORTOGRAPHY 2. INFERIOR MESENTERIC ARTERY ARTERIOGRAPHY 3. COIL EMBOLIZATION OF ACTIVE EXTRAVASATION AT DISTAL DESCENDING COLON / PROXIMAL SIGMOID COLON COMPARISON:  CT AP, earlier same day. MEDICATIONS: Benadryl 25 mg IV.  Ancef 2 g IV. ANESTHESIA/SEDATION: Moderate (conscious) sedation was employed during this procedure. A total of Versed 2 mg and Fentanyl 100 mcg was administered intravenously. Moderate Sedation Time: 85 minutes. The patient's level of consciousness and vital signs were monitored continuously by radiology nursing throughout the procedure under my direct supervision. CONTRAST:  65 mL Omnipaque 300 FLUOROSCOPY TIME:  Fluoroscopic dose; 416 mGy COMPLICATIONS: None immediate. PROCEDURE: Informed consent was obtained from the patient and/or patient's representative following explanation of the procedure, risks, benefits and alternatives. All questions were addressed. A time out was performed prior to the initiation of the procedure. Maximal barrier sterile technique utilized including caps, mask, sterile gowns, sterile gloves, large sterile drape, hand hygiene, and Betadine prep. The RIGHT femoral head was marked fluoroscopically. Under sterile conditions and local anesthesia, the RIGHT common femoral artery access was performed with a micropuncture needle. Under direct ultrasound guidance, the RIGHT common femoral was accessed with a micropuncture kit. An ultrasound image was saved for documentation purposes. This allowed for placement of a 5 Fr vascular sheath. A limited arteriogram was performed through the side arm of the sheath confirming appropriate access within the RIGHT common femoral artery. The short sheath was then exchanged for a 5 Fr, 35 cm Brite tip for additional stability.  A limited abdominal aortogram was performed to help identify the IMA origin. Over a Bentson wire, a Sos catheter was advanced, reformed, back bled and flushed. The catheter was then utilized to select the inferior mesenteric artery and a selective inferior mesenteric arteriogram was performed. Using a 2.5 Fr lantern microcatheter and microwire, access into the distal LEFT colic/sigmoid artery branch vessels was performed and selective arteriography was obtained to identify the bleeding vessel. Super selective coil embolization was then performed using multiple non detachable micro coils. A post embolization arteriogram was performed without evidence of residual extravasation. Images were reviewed and the procedure was terminated. All wires, catheters and sheaths were removed from the patient. Hemostasis was achieved at the RIGHT groin access site with Angio-Seal closure device. The patient tolerated the procedure well without immediate post procedural complication. FINDINGS: - access via the RIGHT femoral artery. - Focus of active extravasation from IMA territory, distal LEFT colic / sigmoid artery branch vessel - Successful embolization without residual extravasation - Angio-Seal closure at RIGHT groin. Palpable RLE pulses at the end of the Case IMPRESSION: Successful super-selective coil embolization of focus of active extravasation from IMA territory, distal LEFT colic/sigmoid artery branch vessel, as above. PLAN: - The patient is to remain flat for 2 hours with RIGHT leg straight. - The patient may continue to experience several additional bloody bowel movements however should stabilize in the coming days. Michaelle Birks, MD [facility] Radiology Electronically Signed   By: Michaelle Birks M.D.   On: 05/06/2022 00:41  CT ANGIO GI BLEED  Result Date: 05/05/2022 CLINICAL DATA:  Lower GI bleeding. EXAM: CTA ABDOMEN AND PELVIS WITHOUT AND WITH CONTRAST TECHNIQUE:  Multidetector CT imaging of the abdomen and pelvis was performed using the standard protocol during bolus administration of intravenous contrast. Multiplanar reconstructed images and MIPs were obtained and reviewed to evaluate the vascular anatomy. RADIATION DOSE REDUCTION: This exam was performed according to the departmental dose-optimization program which includes automated exposure control, adjustment of the mA and/or kV according to patient size and/or use of iterative reconstruction technique. CONTRAST:  121mL OMNIPAQUE IOHEXOL 350 MG/ML SOLN COMPARISON:  PET-CT 02/22/2022. FINDINGS: VASCULAR Aorta: Normal caliber aorta without aneurysm, dissection, vasculitis or significant stenosis. There are atherosclerotic calcifications throughout the aorta. Celiac: Patent without evidence of aneurysm, dissection, vasculitis or significant stenosis. SMA: Patent without evidence of aneurysm, dissection, vasculitis or significant stenosis. Renals: Both renal arteries are patent without evidence of aneurysm, dissection, vasculitis, fibromuscular dysplasia or significant stenosis. IMA: Patent without evidence of aneurysm, dissection, vasculitis or significant stenosis. Inflow: Patent without evidence of aneurysm, dissection, vasculitis or significant stenosis. Atherosclerotic calcifications are present. Proximal Outflow: Bilateral common femoral and visualized portions of the superficial and profunda femoral arteries are patent without evidence of aneurysm, dissection, vasculitis or significant stenosis. Veins: Within normal limits. Review of the MIP images confirms the above findings. NON-VASCULAR Lower chest: Scarring in the right lung base is unchanged. Hepatobiliary: There are 3 subcentimeter rounded hypodensities in the right lobe of the liver. The larger are unchanged from prior in the smaller are too small to characterize. These are favored as cysts or hemangiomas. Gallbladder surgically absent. There is no biliary  ductal dilatation. Pancreas: Unremarkable. No pancreatic ductal dilatation or surrounding inflammatory changes. Spleen: Normal in size without focal abnormality. Adrenals/Urinary Tract: There are subcentimeter cortical cysts in each kidney. Otherwise, the kidneys, adrenal glands and bladder are within normal limits. Stomach/Bowel: There is active arterial extravasation of contrast/bleeding within the descending/sigmoid colon junction. There is no focal wall thickening or inflammation in this region. There is no bowel obstruction or free air. There is diffuse colonic diverticulosis. The appendix, stomach and small bowel loops appear normal. Lymphatic: No enlarged lymph nodes are identified. Reproductive: Status post hysterectomy. No adnexal masses. Other: No abdominal wall hernia or abnormality. No abdominopelvic ascites. Musculoskeletal: Degenerative changes affect the spine. IMPRESSION: 1. Active bleeding within the descending/sigmoid colon junction. No focal wall thickening or inflammation. Aortic Atherosclerosis (ICD10-I70.0). NON-VASCULAR 1. No other acute localizing process in the abdomen or pelvis. 2. Subcentimeter Bosniak II benign renal cyst. No follow-up imaging is recommended. JACR 2018 Feb; [id number], Management of the Incidental Renal Mass on CT, [employer] 2021; [id number], Bosniak Classification of Cystic Renal Masses, Version 2019. Electronically Signed   By: Ronney Asters M.D.   On: 05/05/2022 18:08   DG Chest Port 1 View  Result Date: 05/05/2022 CLINICAL DATA:  Weakness. EXAM: PORTABLE CHEST 1 VIEW COMPARISON:  Chest radiograph dated 12/09/2021 and CT chest dated 02/04/2022. FINDINGS: The heart size and mediastinal contours are within normal limits. Vascular calcifications are seen in the aortic arch. The patient's known bilateral pulmonary nodules are better seen on prior CT. No new focal consolidation, pleural effusion, or pneumothorax is identified. The visualized skeletal structures are  unremarkable. A left subclavian approach cardiac device and median sternotomy wires are redemonstrated. IMPRESSION: No acute pulmonary process. The patient's known bilateral pulmonary nodules are better seen on prior CT. Electronically Signed   By: Zerita Boers M.D.   On: 05/05/2022 14:36  Pending Labs Unresulted Labs (From admission, onward)     Start     Ordered   05/07/22 3557  Basic metabolic panel  Tomorrow morning,   R        05/06/22 1738   05/07/22 0500  Magnesium  Tomorrow morning,   R        05/06/22 1738   05/05/22 2113  CBC  Now then every 6 hours,   R      05/05/22 2112            Vitals/Pain Today's Vitals   05/06/22 1545 05/06/22 1800 05/06/22 1930 05/06/22 1957  BP: 139/60 116/66  136/61  Pulse: 83   90  Resp: (!) 22 (!) 24 (!) 5 (!) 22  Temp: 98.3 F (36.8 C)   100.1 F (37.8 C)  TempSrc: Oral   Oral  SpO2: 94%   96%  PainSc:        Isolation Precautions No active isolations  Medications Medications  lidocaine-EPINEPHrine (XYLOCAINE W/EPI) 1 %-1:100000 (with pres) injection (  Not Given 05/06/22 0117)  nicotine (NICODERM CQ - dosed in mg/24 hours) patch 21 mg (21 mg Transdermal Patch Applied 05/06/22 0918)  pantoprazole (PROTONIX) injection 40 mg (40 mg Intravenous Given 05/06/22 1453)  cyanocobalamin (VITAMIN B12) injection 1,000 mcg (1,000 mcg Intramuscular Given 05/06/22 0919)  arformoterol (BROVANA) nebulizer solution 15 mcg (has no administration in time range)    And  umeclidinium bromide (INCRUSE ELLIPTA) 62.5 MCG/ACT 1 puff (has no administration in time range)  albuterol (VENTOLIN HFA) 108 (90 Base) MCG/ACT inhaler 2 puff (has no administration in time range)  acetaminophen (TYLENOL) tablet 650 mg (has no administration in time range)  sodium chloride 0.9 % bolus 500 mL (0 mLs Intravenous Stopped 05/05/22 1842)  iohexol (OMNIPAQUE) 350 MG/ML injection 100 mL (100 mLs Intravenous Contrast Given 05/05/22 1748)  acetaminophen (TYLENOL) tablet 650 mg (650  mg Oral Given 05/05/22 1843)  sodium chloride 0.9 % bolus 500 mL (0 mLs Intravenous Stopped 05/06/22 0206)  iohexol (OMNIPAQUE) 300 MG/ML solution 150 mL (65 mLs Intra-arterial Contrast Given 05/06/22 0007)  magnesium sulfate IVPB 2 g 50 mL (0 g Intravenous Stopped 05/06/22 2025)    Mobility walks with device Low fall risk   Focused Assessments    R Recommendations: See Admitting Provider Note  Report given to:   Additional Notes: Patient A&Ox4. Clear liquid diet. Holding plavix. CBC draws q6 hours.

## 2022-05-06 NOTE — Consult Note (Addendum)
[facility] Consult: 8:31 AM 05/06/2022  LOS: 1 day    Referring Provider: Dr Bonner Puna  Primary Care Physician:  [facility] Primary Gastroenterologist:  Dr. Gerlene Burdock at [place] for Consultation: Acute hematochezia.   HPI: Karen [name] is a 77 y.o. female.  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  CAD, MI with prior cardiac stenting and four-vessel CABG.  Remote cardiac defibrillator placement, changed out 2014.  Back pain, uses oxycodone as needed.  Mild CKD.  Previous colonoscopy and EGD, these were in 2008 to those she had prior EGD.  Recalls having had polyps and dilation of esophagus for dysphagia.  Although 2008 procedures were in [place], unable to locate provider or location for these.  Long history of solid and liquid dysphagia which is somewhat intermittent.  Occasional regurgitation.  Active reflux symptoms.  Reflux symptoms were refractory to prior medicine and pantoprazole started this past Friday, 3 days ago.  She was set up for EGD, colonoscopy for this coming Friday.  Generally has bowel movements daily.  No history of GI bleeding.  Does not use NSAIDs or aspirin.  Early Sunday morning, yesterday, got up thinking she had diarrhea.  It was dark so she did not see the contents of the commode before she flushed.  Around 11 that morning she she had dripping from her rectum which when she cleaned up appeared to be blood.  Proceeded to have 2 moderate to large volumes of hematochezia at home before EMS arrived.  2 more episodes after arrival in the ED, the last episode was around 8 PM yesterday.  Initial blood pressures as low as 88/55 without tachycardia. CT angio GI bleed study revealed active bleeding at junction of descending/sigmoid.   Subsequently underwent arteriography with coil embolization of active bleed at left colic/sigmoid artery branch vessel. Has not had any bleeding after this.  Has moderate level pain in left mid to lower abdomen.  No nausea or vomiting.  Hgb 10..  8.8.Marland Kitchen  1 PRBC .. 11.5.  Hb 11 months ago and 4 months ago 11.9.  MCV ranging 99-1 02.  Platelets in the 140s.  Anemia labs with elevated iron, elevated iron sats, normal B12, folate, ferritin  Lives with 2 adult sons and 1 granddaughter in [place].  No EtOH.  Still smokes small amounts, up to 3 cigarettes a day. Family history of colon cancer which was the cause of her oldest brothers death in his 74s.   Past Medical History:  Diagnosis Date   Acute myocardial infarction, unspecified site, episode of care unspecified    Adenocarcinoma, lung ([place]) 02/16/2021   Allergic rhinitis, cause unspecified    CAD (coronary artery disease)    GERD (gastroesophageal reflux disease)    Headache(784.0)    History of radiation therapy    left lung SBRT 02/08/2021, 02/13/2021, 02/15/2021  Dr Gery Pray   HTN (hypertension)    Other and unspecified hyperlipidemia    Other diseases of lung, not elsewhere classified    Postsurgical  aortocoronary bypass status    hx of it.     Past Surgical History:  Procedure Laterality Date   CARDIAC DEFIBRILLATOR PLACEMENT  03/2006   [employer]. remote-yes.    CORONARY ARTERY BYPASS GRAFT     x4   IMPLANTABLE CARDIOVERTER DEFIBRILLATOR GENERATOR CHANGE N/A 03/03/2013   Procedure: IMPLANTABLE CARDIOVERTER DEFIBRILLATOR GENERATOR CHANGE;  Surgeon: Deboraha Sprang, MD;  Location: [facility] CATH LAB;  Service: Cardiovascular;  Laterality: N/A;   IR ANGIOGRAM VISCERAL SELECTIVE  05/06/2022   IR AORTAGRAM ABDOMINAL SERIALOGRAM  05/06/2022   IR EMBO ART  VEN HEMORR LYMPH EXTRAV  INC GUIDE ROADMAPPING  05/06/2022   IR US GUIDE VASC ACCESS RIGHT  05/06/2022   stent implant     x1. possibly x2.     Prior to Admission medications    Medication Sig Start Date End Date Taking? Authorizing Provider  acetaminophen (TYLENOL) 500 MG tablet Take 500 mg by mouth every 6 (six) hours as needed for headache.   Yes [provider]  albuterol (VENTOLIN HFA) 108 (90 Base) MCG/ACT inhaler Inhale 2 puffs into the lungs every 6 (six) hours as needed for wheezing or shortness of breath. 05/21/21  Yes Cobb, Karie Schwalbe, NP  clopidogrel (PLAVIX) 75 MG tablet TAKE 1 TABLET BY MOUTH DAILY Patient taking differently: Take 75 mg by mouth daily. 12/06/21  Yes Jettie Booze, MD  cyclobenzaprine (FLEXERIL) 10 MG tablet Take 10 mg by mouth 3 (three) times daily as needed for muscle spasms. 01/16/21  Yes [provider]  famotidine (PEPCID) 40 MG tablet Take 1 tablet (40 mg total) by mouth at bedtime. 03/28/22  Yes Collene Gobble, MD  fluticasone (FLONASE) 50 MCG/ACT nasal spray Place 1 spray into both nostrils daily. Patient taking differently: Place 1 spray into both nostrils daily as needed for allergies. 04/06/21  Yes Cobb, Karie Schwalbe, NP  furosemide (LASIX) 20 MG tablet TAKE 1 TABLET BY MOUTH DAILY Patient taking differently: Take 20 mg by mouth daily. 03/05/22  Yes Jettie Booze, MD  guaiFENesin (MUCINEX) 600 MG 12 hr tablet Take 1 tablet (600 mg total) by mouth 2 (two) times daily. Patient taking differently: Take 600 mg by mouth daily as needed for cough. 04/06/21  Yes Cobb, Karie Schwalbe, NP  isosorbide mononitrate (IMDUR) 60 MG 24 hr tablet Take 1 tablet (60 mg total) by mouth 2 (two) times daily. 10/29/21  Yes Jettie Booze, MD  lisinopril (ZESTRIL) 10 MG tablet TAKE 1 TABLET BY MOUTH DAILY 03/05/22  Yes Jettie Booze, MD  loratadine (CLARITIN) 10 MG tablet Take 1 tablet (10 mg total) by mouth daily. Patient taking differently: Take 10 mg by mouth daily as needed for allergies. 04/06/21  Yes Cobb, Karie Schwalbe, NP  nitroGLYCERIN (NITROSTAT) 0.4 MG SL tablet Place 1 tablet (0.4 mg total) under the tongue every  5 (five) minutes as needed for chest pain (X3 DOSES MAX). 02/16/21  Yes Bhagat, Bhavinkumar, PA  oxyCODONE-acetaminophen (PERCOCET) 7.5-325 MG tablet Take 1-2 tablets by mouth every 4 (four) hours as needed for moderate pain (back pain).   Yes [provider]  Tiotropium Bromide-Olodaterol (STIOLTO RESPIMAT) 2.5-2.5 MCG/ACT AERS Inhale 2 puffs into the lungs daily. 03/28/22  Yes Collene Gobble, MD  losartan (COZAAR) 50 MG tablet Take 1 tablet (50 mg total) by mouth daily. Patient not taking: Reported on 05/06/2022 03/28/22   Collene Gobble, MD  naloxone [facility]) nasal spray 4 mg/0.1 mL SMARTSIG:Both Nares 02/26/22   [provider]  Scheduled Meds:  sodium chloride   Intravenous Once   sodium chloride   Intravenous Once   cyanocobalamin  1,000 mcg Intramuscular Daily   lidocaine-EPINEPHrine       nicotine  21 mg Transdermal Daily   pantoprazole (PROTONIX) IV  40 mg Intravenous Q12H   Infusions:  ceFAZolin     desmopressin (DDAVP) 27.6 mcg in sodium chloride 0.9 % 50 mL IVPB     PRN Meds: ceFAZolin, diphenhydrAMINE, fentaNYL, lidocaine-EPINEPHrine, midazolam   Allergies as of 05/05/2022 - Review Complete 05/05/2022  Allergen Reaction Noted   Codeine Itching and Swelling    Percocet [oxycodone-acetaminophen] Hives and Itching 02/26/2013    Family History  Problem Relation Age of Onset   Heart attack Mother    Heart disease Mother    Breast cancer Maternal Aunt     Social History   Socioeconomic History   Marital status: Legally Separated    Spouse name: Not on file   Number of children: Not on file   Years of education: Not on file   Highest education level: Not on file  Occupational History   Not on file  Tobacco Use   Smoking status: Some Days    Packs/day: 0.50    Years: 54.00    Total pack years: 27.00    Types: Cigarettes    Start date: 1968   Smokeless tobacco: Never   Tobacco comments:    2 cigarettes smoked daily. 03/28/22 ARJ    Vaping Use   Vaping Use: Never used  Substance and Sexual Activity   Alcohol use: No   Drug use: No   Sexual activity: Not on file  Other Topics Concern   Not on file  Social History Narrative   Married, 4 children.    Social Determinants of Health   Financial Resource Strain: Not on file  Food Insecurity: Not on file  Transportation Needs: Not on file  Physical Activity: Not on file  Stress: Not on file  Social Connections: Not on file  Intimate Partner Violence: Not on file    REVIEW OF SYSTEMS: Constitutional: Some weakness but nothing profound.  No fatigue. ENT:  No nose bleeds Pulm: No new dyspnea or cough. CV:  No palpitations, no LE edema.  No angina. GU:  No hematuria, no frequency GI: See HPI. Heme: Bruises easily but no excessive bleeding or bruising. Transfusions: No prior transfusions. Neuro:  No headaches, no peripheral tingling or numbness.  No dizziness.  No presyncope. Derm:  No itching, no rash or sores.  Endocrine:  No sweats or chills.  No polyuria or dysuria Immunization: Not queried Travel: Not queried   PHYSICAL EXAM: Vital signs in last 24 hours: Vitals:   05/06/22 0800 05/06/22 0815  BP: (!) 126/53 (!) 137/57  Pulse:  87  Resp: 20 13  Temp:    SpO2:  100%   Wt Readings from Last 3 Encounters:  04/23/22 69.4 kg  03/28/22 69.4 kg  03/07/22 69.2 kg    General: Patient looks well.  She is alert, comfortable and able to answer questions. Head: No facial asymmetry or swelling.  No signs of head trauma. Eyes: No conjunctival pallor Ears: Not hard of hearing Nose: No congestion or discharge Mouth: Edentulous.  Tongue midline.  Mucosa is pink, moist, clear. Neck: No JVD, no masses. Lungs: Diminished breath sounds globally but no adventitious sounds, no dyspnea, no cough Heart: RRR.  No MRG.  S1, S2 present.  Well-healed midline sternotomy scar. Abdomen: Soft.  Tender bilaterally without guarding or rebound, tenderness more apparent on  left abdomen.  Active bowel sounds.  No distention.   Rectal: Deferred. Musc/Skeltl: No joint redness, swelling or gross deformity. Extremities: No CCE. Neurologic: Oriented x 3.  Moves all 4 limbs without tremor or weakness. Skin: No rash, no sores, no significant purpura or bruising. Nodes: No cervical adenopathy Psych: Pleasant, calm, cooperative.  Intake/Output from previous day: 01/07 0701 - 01/08 0700 In: 100 [I.V.:100] Out: -  Intake/Output this shift: No intake/output data recorded.  LAB RESULTS: Recent Labs    05/05/22 1354 05/05/22 2201 05/06/22 0317  WBC 4.8 5.2 6.9  HGB 10.0* 8.8* 11.5*  HCT 32.0* 27.6* 36.8  PLT 146* 142* 142*   BMET Lab Results  Component Value Date   NA 136 05/06/2022   NA 140 05/05/2022   NA 140 02/04/2022   K 4.0 05/06/2022   K 3.8 05/05/2022   K 4.2 02/04/2022   CL 111 05/06/2022   CL 106 05/05/2022   CL 104 02/04/2022   CO2 18 (L) 05/06/2022   CO2 26 05/05/2022   CO2 32 02/04/2022   GLUCOSE 103 (H) 05/06/2022   GLUCOSE 111 (H) 05/05/2022   GLUCOSE 98 02/04/2022   BUN 17 05/06/2022   BUN 12 05/05/2022   BUN 14 02/04/2022   CREATININE 1.12 (H) 05/06/2022   CREATININE 1.20 (H) 05/05/2022   CREATININE 1.31 (H) 02/04/2022   CALCIUM 8.2 (L) 05/06/2022   CALCIUM 8.6 (L) 05/05/2022   CALCIUM 9.5 02/04/2022   LFT Recent Labs    05/05/22 1354 05/06/22 0317  PROT 5.3* 4.8*  ALBUMIN 3.2* 3.1*  AST 16 14*  ALT 13 10  ALKPHOS 55 52  BILITOT 0.6 0.8   PT/INR Lab Results  Component Value Date   INR 1.1 01/03/2021   INR 1.1 10/27/2007   Hepatitis Panel No results for input(s): "HEPBSAG", "HCVAB", "HEPAIGM", "HEPBIGM" in the last 72 hours. C-Diff No components found for: "CDIFF" Lipase     Component Value Date/Time   LIPASE 20 05/05/2022 1354    Drugs of Abuse  No results found for: "LABOPIA", "COCAINSCRNUR", "LABBENZ", "AMPHETMU", "THCU", "LABBARB"   RADIOLOGY STUDIES: IR EMBO ART  VEN HEMORR LYMPH EXTRAV  INC  GUIDE ROADMAPPING  Result Date: 05/06/2022 INDICATION: Hematochezia.  Hypotension. EXAM: Procedures: 1. ABDOMINAL AORTOGRAPHY 2. INFERIOR MESENTERIC ARTERY ARTERIOGRAPHY 3. COIL EMBOLIZATION OF ACTIVE EXTRAVASATION AT DISTAL DESCENDING COLON / PROXIMAL SIGMOID COLON COMPARISON:  CT AP, earlier same day. MEDICATIONS: Benadryl 25 mg IV.  Ancef 2 g IV. ANESTHESIA/SEDATION: Moderate (conscious) sedation was employed during this procedure. A total of Versed 2 mg and Fentanyl 100 mcg was administered intravenously. Moderate Sedation Time: 85 minutes. The patient's level of consciousness and vital signs were monitored continuously by radiology nursing throughout the procedure under my direct supervision. CONTRAST:  65 mL Omnipaque 300 FLUOROSCOPY TIME:  Fluoroscopic dose; 038 mGy COMPLICATIONS: None immediate. PROCEDURE: Informed consent was obtained from the patient and/or patient's representative following explanation of the procedure, risks, benefits and alternatives. All questions were addressed. A time out was performed prior to the initiation of the procedure. Maximal barrier sterile technique utilized including caps, mask, sterile gowns, sterile gloves, large sterile drape, hand hygiene, and Betadine prep. The RIGHT femoral head was marked fluoroscopically. Under sterile conditions and local anesthesia, the RIGHT common femoral artery access was performed with a micropuncture needle. Under direct ultrasound guidance, the RIGHT common femoral was accessed with a micropuncture kit. An ultrasound image was saved for  documentation purposes. This allowed for placement of a 5 Fr vascular sheath. A limited arteriogram was performed through the side arm of the sheath confirming appropriate access within the RIGHT common femoral artery. The short sheath was then exchanged for a 5 Fr, 35 cm Brite tip for additional stability. A limited abdominal aortogram was performed to help identify the IMA origin. Over a Bentson wire, a  Sos catheter was advanced, reformed, back bled and flushed. The catheter was then utilized to select the inferior mesenteric artery and a selective inferior mesenteric arteriogram was performed. Using a 2.5 Fr lantern microcatheter and microwire, access into the distal LEFT colic/sigmoid artery branch vessels was performed and selective arteriography was obtained to identify the bleeding vessel. Super selective coil embolization was then performed using multiple non detachable micro coils. A post embolization arteriogram was performed without evidence of residual extravasation. Images were reviewed and the procedure was terminated. All wires, catheters and sheaths were removed from the patient. Hemostasis was achieved at the RIGHT groin access site with Angio-Seal closure device. The patient tolerated the procedure well without immediate post procedural complication. FINDINGS: - access via the RIGHT femoral artery. - Focus of active extravasation from IMA territory, distal LEFT colic / sigmoid artery branch vessel - Successful embolization without residual extravasation - Angio-Seal closure at RIGHT groin. Palpable RLE pulses at the end of the Case IMPRESSION: Successful super-selective coil embolization of focus of active extravasation from IMA territory, distal LEFT colic/sigmoid artery branch vessel, as above. PLAN: - The patient is to remain flat for 2 hours with RIGHT leg straight. - The patient may continue to experience several additional bloody bowel movements however should stabilize in the coming days. Michaelle Birks, MD [facility] Radiology Electronically Signed   By: Michaelle Birks M.D.   On: 05/06/2022 00:41   IR US Guide Vasc Access Right  Result Date: 05/06/2022 INDICATION: Hematochezia.  Hypotension. EXAM: Procedures: 1. ABDOMINAL AORTOGRAPHY 2. INFERIOR MESENTERIC ARTERY ARTERIOGRAPHY 3. COIL EMBOLIZATION OF ACTIVE EXTRAVASATION AT DISTAL DESCENDING  COLON / PROXIMAL SIGMOID COLON COMPARISON:  CT AP, earlier same day. MEDICATIONS: Benadryl 25 mg IV.  Ancef 2 g IV. ANESTHESIA/SEDATION: Moderate (conscious) sedation was employed during this procedure. A total of Versed 2 mg and Fentanyl 100 mcg was administered intravenously. Moderate Sedation Time: 85 minutes. The patient's level of consciousness and vital signs were monitored continuously by radiology nursing throughout the procedure under my direct supervision. CONTRAST:  65 mL Omnipaque 300 FLUOROSCOPY TIME:  Fluoroscopic dose; 532 mGy COMPLICATIONS: None immediate. PROCEDURE: Informed consent was obtained from the patient and/or patient's representative following explanation of the procedure, risks, benefits and alternatives. All questions were addressed. A time out was performed prior to the initiation of the procedure. Maximal barrier sterile technique utilized including caps, mask, sterile gowns, sterile gloves, large sterile drape, hand hygiene, and Betadine prep. The RIGHT femoral head was marked fluoroscopically. Under sterile conditions and local anesthesia, the RIGHT common femoral artery access was performed with a micropuncture needle. Under direct ultrasound guidance, the RIGHT common femoral was accessed with a micropuncture kit. An ultrasound image was saved for documentation purposes. This allowed for placement of a 5 Fr vascular sheath. A limited arteriogram was performed through the side arm of the sheath confirming appropriate access within the RIGHT common femoral artery. The short sheath was then exchanged for a 5 Fr, 35 cm Brite tip for additional stability. A limited abdominal aortogram was performed to help identify the IMA origin.  Over a Bentson wire, a Sos catheter was advanced, reformed, back bled and flushed. The catheter was then utilized to select the inferior mesenteric artery and a selective inferior mesenteric arteriogram was performed. Using a 2.5 Fr lantern microcatheter and  microwire, access into the distal LEFT colic/sigmoid artery branch vessels was performed and selective arteriography was obtained to identify the bleeding vessel. Super selective coil embolization was then performed using multiple non detachable micro coils. A post embolization arteriogram was performed without evidence of residual extravasation. Images were reviewed and the procedure was terminated. All wires, catheters and sheaths were removed from the patient. Hemostasis was achieved at the RIGHT groin access site with Angio-Seal closure device. The patient tolerated the procedure well without immediate post procedural complication. FINDINGS: - access via the RIGHT femoral artery. - Focus of active extravasation from IMA territory, distal LEFT colic / sigmoid artery branch vessel - Successful embolization without residual extravasation - Angio-Seal closure at RIGHT groin. Palpable RLE pulses at the end of the Case IMPRESSION: Successful super-selective coil embolization of focus of active extravasation from IMA territory, distal LEFT colic/sigmoid artery branch vessel, as above. PLAN: - The patient is to remain flat for 2 hours with RIGHT leg straight. - The patient may continue to experience several additional bloody bowel movements however should stabilize in the coming days. Michaelle Birks, MD [facility] Radiology Electronically Signed   By: Michaelle Birks M.D.   On: 05/06/2022 00:41   IR Angiogram Visceral Selective  Result Date: 05/06/2022 INDICATION: Hematochezia.  Hypotension. EXAM: Procedures: 1. ABDOMINAL AORTOGRAPHY 2. INFERIOR MESENTERIC ARTERY ARTERIOGRAPHY 3. COIL EMBOLIZATION OF ACTIVE EXTRAVASATION AT DISTAL DESCENDING COLON / PROXIMAL SIGMOID COLON COMPARISON:  CT AP, earlier same day. MEDICATIONS: Benadryl 25 mg IV.  Ancef 2 g IV. ANESTHESIA/SEDATION: Moderate (conscious) sedation was employed during this procedure. A total of Versed 2 mg and  Fentanyl 100 mcg was administered intravenously. Moderate Sedation Time: 85 minutes. The patient's level of consciousness and vital signs were monitored continuously by radiology nursing throughout the procedure under my direct supervision. CONTRAST:  65 mL Omnipaque 300 FLUOROSCOPY TIME:  Fluoroscopic dose; 295 mGy COMPLICATIONS: None immediate. PROCEDURE: Informed consent was obtained from the patient and/or patient's representative following explanation of the procedure, risks, benefits and alternatives. All questions were addressed. A time out was performed prior to the initiation of the procedure. Maximal barrier sterile technique utilized including caps, mask, sterile gowns, sterile gloves, large sterile drape, hand hygiene, and Betadine prep. The RIGHT femoral head was marked fluoroscopically. Under sterile conditions and local anesthesia, the RIGHT common femoral artery access was performed with a micropuncture needle. Under direct ultrasound guidance, the RIGHT common femoral was accessed with a micropuncture kit. An ultrasound image was saved for documentation purposes. This allowed for placement of a 5 Fr vascular sheath. A limited arteriogram was performed through the side arm of the sheath confirming appropriate access within the RIGHT common femoral artery. The short sheath was then exchanged for a 5 Fr, 35 cm Brite tip for additional stability. A limited abdominal aortogram was performed to help identify the IMA origin. Over a Bentson wire, a Sos catheter was advanced, reformed, back bled and flushed. The catheter was then utilized to select the inferior mesenteric artery and a selective inferior mesenteric arteriogram was performed. Using a 2.5 Fr lantern microcatheter and microwire, access into the distal LEFT colic/sigmoid artery branch vessels was performed and selective arteriography was obtained to identify the bleeding vessel. Super selective  coil embolization was then performed using multiple  non detachable micro coils. A post embolization arteriogram was performed without evidence of residual extravasation. Images were reviewed and the procedure was terminated. All wires, catheters and sheaths were removed from the patient. Hemostasis was achieved at the RIGHT groin access site with Angio-Seal closure device. The patient tolerated the procedure well without immediate post procedural complication. FINDINGS: - access via the RIGHT femoral artery. - Focus of active extravasation from IMA territory, distal LEFT colic / sigmoid artery branch vessel - Successful embolization without residual extravasation - Angio-Seal closure at RIGHT groin. Palpable RLE pulses at the end of the Case IMPRESSION: Successful super-selective coil embolization of focus of active extravasation from IMA territory, distal LEFT colic/sigmoid artery branch vessel, as above. PLAN: - The patient is to remain flat for 2 hours with RIGHT leg straight. - The patient may continue to experience several additional bloody bowel movements however should stabilize in the coming days. Michaelle Birks, MD [facility] Radiology Electronically Signed   By: Michaelle Birks M.D.   On: 05/06/2022 00:41   IR Aortagram Abdominal Serialogram  Result Date: 05/06/2022 INDICATION: Hematochezia.  Hypotension. EXAM: Procedures: 1. ABDOMINAL AORTOGRAPHY 2. INFERIOR MESENTERIC ARTERY ARTERIOGRAPHY 3. COIL EMBOLIZATION OF ACTIVE EXTRAVASATION AT DISTAL DESCENDING COLON / PROXIMAL SIGMOID COLON COMPARISON:  CT AP, earlier same day. MEDICATIONS: Benadryl 25 mg IV.  Ancef 2 g IV. ANESTHESIA/SEDATION: Moderate (conscious) sedation was employed during this procedure. A total of Versed 2 mg and Fentanyl 100 mcg was administered intravenously. Moderate Sedation Time: 85 minutes. The patient's level of consciousness and vital signs were monitored continuously by radiology nursing throughout the procedure under my direct  supervision. CONTRAST:  65 mL Omnipaque 300 FLUOROSCOPY TIME:  Fluoroscopic dose; 884 mGy COMPLICATIONS: None immediate. PROCEDURE: Informed consent was obtained from the patient and/or patient's representative following explanation of the procedure, risks, benefits and alternatives. All questions were addressed. A time out was performed prior to the initiation of the procedure. Maximal barrier sterile technique utilized including caps, mask, sterile gowns, sterile gloves, large sterile drape, hand hygiene, and Betadine prep. The RIGHT femoral head was marked fluoroscopically. Under sterile conditions and local anesthesia, the RIGHT common femoral artery access was performed with a micropuncture needle. Under direct ultrasound guidance, the RIGHT common femoral was accessed with a micropuncture kit. An ultrasound image was saved for documentation purposes. This allowed for placement of a 5 Fr vascular sheath. A limited arteriogram was performed through the side arm of the sheath confirming appropriate access within the RIGHT common femoral artery. The short sheath was then exchanged for a 5 Fr, 35 cm Brite tip for additional stability. A limited abdominal aortogram was performed to help identify the IMA origin. Over a Bentson wire, a Sos catheter was advanced, reformed, back bled and flushed. The catheter was then utilized to select the inferior mesenteric artery and a selective inferior mesenteric arteriogram was performed. Using a 2.5 Fr lantern microcatheter and microwire, access into the distal LEFT colic/sigmoid artery branch vessels was performed and selective arteriography was obtained to identify the bleeding vessel. Super selective coil embolization was then performed using multiple non detachable micro coils. A post embolization arteriogram was performed without evidence of residual extravasation. Images were reviewed and the procedure was terminated. All wires, catheters and sheaths were removed from the  patient. Hemostasis was achieved at the RIGHT groin access site with Angio-Seal closure device. The patient tolerated the procedure well without immediate post procedural  complication. FINDINGS: - access via the RIGHT femoral artery. - Focus of active extravasation from IMA territory, distal LEFT colic / sigmoid artery branch vessel - Successful embolization without residual extravasation - Angio-Seal closure at RIGHT groin. Palpable RLE pulses at the end of the Case IMPRESSION: Successful super-selective coil embolization of focus of active extravasation from IMA territory, distal LEFT colic/sigmoid artery branch vessel, as above. PLAN: - The patient is to remain flat for 2 hours with RIGHT leg straight. - The patient may continue to experience several additional bloody bowel movements however should stabilize in the coming days. Michaelle Birks, MD [facility] Electronically Signed   By: Michaelle Birks M.D.   On: 05/06/2022 00:41   CT ANGIO GI BLEED  Result Date: 05/05/2022 CLINICAL DATA:  Lower GI bleeding. EXAM: CTA ABDOMEN AND PELVIS WITHOUT AND WITH CONTRAST TECHNIQUE: Multidetector CT imaging of the abdomen and pelvis was performed using the standard protocol during bolus administration of intravenous contrast. Multiplanar reconstructed images and MIPs were obtained and reviewed to evaluate the vascular anatomy. RADIATION DOSE REDUCTION: This exam was performed according to the departmental dose-optimization program which includes automated exposure control, adjustment of the mA and/or kV according to patient size and/or use of iterative reconstruction technique. CONTRAST:  169mL OMNIPAQUE IOHEXOL 350 MG/ML SOLN COMPARISON:  PET-CT 02/22/2022. FINDINGS: VASCULAR Aorta: Normal caliber aorta without aneurysm, dissection, vasculitis or significant stenosis. There are atherosclerotic calcifications throughout the aorta. Celiac: Patent without evidence of  aneurysm, dissection, vasculitis or significant stenosis. SMA: Patent without evidence of aneurysm, dissection, vasculitis or significant stenosis. Renals: Both renal arteries are patent without evidence of aneurysm, dissection, vasculitis, fibromuscular dysplasia or significant stenosis. IMA: Patent without evidence of aneurysm, dissection, vasculitis or significant stenosis. Inflow: Patent without evidence of aneurysm, dissection, vasculitis or significant stenosis. Atherosclerotic calcifications are present. Proximal Outflow: Bilateral common femoral and visualized portions of the superficial and profunda femoral arteries are patent without evidence of aneurysm, dissection, vasculitis or significant stenosis. Veins: Within normal limits. Review of the MIP images confirms the above findings. NON-VASCULAR Lower chest: Scarring in the right lung base is unchanged. Hepatobiliary: There are 3 subcentimeter rounded hypodensities in the right lobe of the liver. The larger are unchanged from prior in the smaller are too small to characterize. These are favored as cysts or hemangiomas. Gallbladder surgically absent. There is no biliary ductal dilatation. Pancreas: Unremarkable. No pancreatic ductal dilatation or surrounding inflammatory changes. Spleen: Normal in size without focal abnormality. Adrenals/Urinary Tract: There are subcentimeter cortical cysts in each kidney. Otherwise, the kidneys, adrenal glands and bladder are within normal limits. Stomach/Bowel: There is active arterial extravasation of contrast/bleeding within the descending/sigmoid colon junction. There is no focal wall thickening or inflammation in this region. There is no bowel obstruction or free air. There is diffuse colonic diverticulosis. The appendix, stomach and small bowel loops appear normal. Lymphatic: No enlarged lymph nodes are identified. Reproductive: Status post hysterectomy. No adnexal masses. Other: No abdominal wall hernia or  abnormality. No abdominopelvic ascites. Musculoskeletal: Degenerative changes affect the spine. IMPRESSION: 1. Active bleeding within the descending/sigmoid colon junction. No focal wall thickening or inflammation. Aortic Atherosclerosis (ICD10-I70.0). NON-VASCULAR 1. No other acute localizing process in the abdomen or pelvis. 2. Subcentimeter Bosniak II benign renal cyst. No follow-up imaging is recommended. JACR 2018 Feb; 264-273, Management of the Incidental Renal Mass on CT, [employer] 2021; [id number], Bosniak Classification of Cystic Renal Masses, Version 2019. Electronically Signed   By: Tina Griffiths.D.  On: 05/05/2022 18:08   DG Chest Port 1 View  Result Date: 05/05/2022 CLINICAL DATA:  Weakness. EXAM: PORTABLE CHEST 1 VIEW COMPARISON:  Chest radiograph dated 12/09/2021 and CT chest dated 02/04/2022. FINDINGS: The heart size and mediastinal contours are within normal limits. Vascular calcifications are seen in the aortic arch. The patient's known bilateral pulmonary nodules are better seen on prior CT. No new focal consolidation, pleural effusion, or pneumothorax is identified. The visualized skeletal structures are unremarkable. A left subclavian approach cardiac device and median sternotomy wires are redemonstrated. IMPRESSION: No acute pulmonary process. The patient's known bilateral pulmonary nodules are better seen on prior CT. Electronically Signed   By: Zerita Boers M.D.   On: 05/05/2022 14:36     IMPRESSION:   Lower GI bleed.  Status post coil embolization of left colic/sigmoid artery branch vessel.  Blood loss anemia.  Excellent response to 1 PRBC.  Longstanding, chronic, somewhat intermittent solid/liquid dysphagia.  Was for EGD as well as colonoscopy this coming Friday with GI at [place].  Chronic Plavix.  History CAD, CABG.  Last dose was morning of 1/7.  Latest echocardiogram 03/29/2022.  LVEF 50 to 55%.  Normal left ventricular diastolic parameters.  No valvular  disease or regurgitation.    Adeno Ca lung.  S/p XRT fall 2022.      PLAN:     Supportive care.  Follow for any further bleeding.  Hold Plavix, duration of hold to be determined and would seek IR input as to when to restart.    Leave CL diet in place for now.     Azucena Freed  05/06/2022, 8:31 AM Phone [phone]   Attending physician's note   I have taken history, reviewed the chart and examined the patient. I performed a substantive portion of this encounter, including complete performance of at least one of the key components, in conjunction with the APP. I agree with the Advanced Practitioner's note, impression and recommendations.    LGI (likely descending/sigmoid diverticular) bleed with +ve CTA s/p IR coil embolization. No further bleeding or s/s ischemic bowel. HD stable. 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 -Clear liquid diet.  Advance diet in AM. -She was scheduled for [facility] later this week. She will reschedule and FU at [facility] as out-pt. -Resume plavix 1/10 if no recurrent bleeding.   Carmell Austria, MD [facility] [phone]

## 2022-05-06 NOTE — Procedures (Signed)
Vascular and Interventional Radiology Procedure Note  Patient: Karen [name] DOB: 02-24-1946 Medical Record Number: [id number] Note Date/Time: 05/06/22 12:10 AM   Performing Physician: Michaelle Birks, MD Assistant(s): Debby Freiberg, IR RT  Diagnosis: LGIB  Procedure:  ABDOMINAL AORTOGRAPHY INFERIOR MESENTERIC ARTERY ARTERIOGRAPHY COIL EMBOLIZATION OF ACTIVE EXTRAVASATION AT DISTAL DESCENDING / PROXIMAL SIGMOID COLON   Anesthesia: Conscious Sedation Complications: None Estimated Blood Loss: Minimal Specimens: None  Findings:  - access via the RIGHT femoral artery. - Focus of active extravasation from IMA territory, distal LEFT colic / sigmoid artery branch vessel - Successful embolization without residual extravasation - Angio-Seal closure at RIGHT groin. - Palpable RLE pulses at the end of the Case  Plan: - Bedrest x2 hours with RIGHT leg straight.   Final report to follow once all images are reviewed and compared with previous studies.  See detailed dictation with images in PACS. The patient tolerated the procedure well without incident or complication and was returned to  ER  in stable condition.    Michaelle Birks, MD [facility] Radiology   Pager. [place]

## 2022-05-06 NOTE — Progress Notes (Signed)
Referring Physician(s): Dr. Bonner Puna  Supervising Physician: Corrie Mckusick  Patient Status:  [facility] - In-pt  Chief Complaint: Acute hematochezia  Subjective: Patient s/p embolization of distal left colic/sigmoig artery branch vessel.  Remains in ED.  Resting comfortably.  No further bleeding/bowel movements. Urine yellow.   Allergies: Codeine and Novocain [procaine]  Medications: Prior to Admission medications   Medication Sig Start Date End Date Taking? Authorizing Provider  acetaminophen (TYLENOL) 500 MG tablet Take 500 mg by mouth every 6 (six) hours as needed for headache.   Yes [provider]  albuterol (VENTOLIN HFA) 108 (90 Base) MCG/ACT inhaler Inhale 2 puffs into the lungs every 6 (six) hours as needed for wheezing or shortness of breath. 05/21/21  Yes Cobb, Karie Schwalbe, NP  clopidogrel (PLAVIX) 75 MG tablet TAKE 1 TABLET BY MOUTH DAILY Patient taking differently: Take 75 mg by mouth daily. 12/06/21  Yes Jettie Booze, MD  cyclobenzaprine (FLEXERIL) 10 MG tablet Take 10 mg by mouth 3 (three) times daily as needed for muscle spasms. 01/16/21  Yes [provider]  famotidine (PEPCID) 40 MG tablet Take 1 tablet (40 mg total) by mouth at bedtime. 03/28/22  Yes Collene Gobble, MD  fluticasone (FLONASE) 50 MCG/ACT nasal spray Place 1 spray into both nostrils daily. Patient taking differently: Place 1 spray into both nostrils daily as needed for allergies. 04/06/21  Yes Cobb, Karie Schwalbe, NP  furosemide (LASIX) 20 MG tablet TAKE 1 TABLET BY MOUTH DAILY Patient taking differently: Take 20 mg by mouth daily. 03/05/22  Yes Jettie Booze, MD  guaiFENesin (MUCINEX) 600 MG 12 hr tablet Take 1 tablet (600 mg total) by mouth 2 (two) times daily. Patient taking differently: Take 600 mg by mouth daily as needed for cough. 04/06/21  Yes Cobb, Karie Schwalbe, NP  isosorbide mononitrate (IMDUR) 60 MG 24 hr tablet Take 1 tablet (60 mg total) by mouth 2 (two) times  daily. 10/29/21  Yes Jettie Booze, MD  lisinopril (ZESTRIL) 10 MG tablet TAKE 1 TABLET BY MOUTH DAILY 03/05/22  Yes Jettie Booze, MD  loratadine (CLARITIN) 10 MG tablet Take 1 tablet (10 mg total) by mouth daily. Patient taking differently: Take 10 mg by mouth daily as needed for allergies. 04/06/21  Yes Cobb, Karie Schwalbe, NP  nitroGLYCERIN (NITROSTAT) 0.4 MG SL tablet Place 1 tablet (0.4 mg total) under the tongue every 5 (five) minutes as needed for chest pain (X3 DOSES MAX). 02/16/21  Yes Bhagat, Bhavinkumar, PA  oxyCODONE-acetaminophen (PERCOCET) 7.5-325 MG tablet Take 1-2 tablets by mouth every 4 (four) hours as needed for moderate pain (back pain).   Yes [provider]  Tiotropium Bromide-Olodaterol (STIOLTO RESPIMAT) 2.5-2.5 MCG/ACT AERS Inhale 2 puffs into the lungs daily. 03/28/22  Yes Collene Gobble, MD  losartan (COZAAR) 50 MG tablet Take 1 tablet (50 mg total) by mouth daily. Patient not taking: Reported on 05/06/2022 03/28/22   Collene Gobble, MD  naloxone [employer]) nasal spray 4 mg/0.1 mL SMARTSIG:Both Nares 02/26/22   [provider]     Vital Signs: BP 139/60   Pulse 73   Temp 98.5 F (36.9 C) (Oral)   Resp 16   SpO2 100%   Physical Exam NAD, lying in bed.  Abdomen: soft, non-distended.  Skin: procedure site intact, clean, dry.  Expected tenderness at site.  No evidence of hematoma or pseudoaneurysm.   Imaging: IR EMBO ART  VEN HEMORR LYMPH EXTRAV  INC GUIDE ROADMAPPING  Result Date:  05/06/2022 INDICATION: Hematochezia.  Hypotension. EXAM: Procedures: 1. ABDOMINAL AORTOGRAPHY 2. INFERIOR MESENTERIC ARTERY ARTERIOGRAPHY 3. COIL EMBOLIZATION OF ACTIVE EXTRAVASATION AT DISTAL DESCENDING COLON / PROXIMAL SIGMOID COLON COMPARISON:  CT AP, earlier same day. MEDICATIONS: Benadryl 25 mg IV.  Ancef 2 g IV. ANESTHESIA/SEDATION: Moderate (conscious) sedation was employed during this procedure. A total of Versed 2 mg and Fentanyl 100 mcg was administered  intravenously. Moderate Sedation Time: 85 minutes. The patient's level of consciousness and vital signs were monitored continuously by radiology nursing throughout the procedure under my direct supervision. CONTRAST:  65 mL Omnipaque 300 FLUOROSCOPY TIME:  Fluoroscopic dose; 161 mGy COMPLICATIONS: None immediate. PROCEDURE: Informed consent was obtained from the patient and/or patient's representative following explanation of the procedure, risks, benefits and alternatives. All questions were addressed. A time out was performed prior to the initiation of the procedure. Maximal barrier sterile technique utilized including caps, mask, sterile gowns, sterile gloves, large sterile drape, hand hygiene, and Betadine prep. The RIGHT femoral head was marked fluoroscopically. Under sterile conditions and local anesthesia, the RIGHT common femoral artery access was performed with a micropuncture needle. Under direct ultrasound guidance, the RIGHT common femoral was accessed with a micropuncture kit. An ultrasound image was saved for documentation purposes. This allowed for placement of a 5 Fr vascular sheath. A limited arteriogram was performed through the side arm of the sheath confirming appropriate access within the RIGHT common femoral artery. The short sheath was then exchanged for a 5 Fr, 35 cm Brite tip for additional stability. A limited abdominal aortogram was performed to help identify the IMA origin. Over a Bentson wire, a Sos catheter was advanced, reformed, back bled and flushed. The catheter was then utilized to select the inferior mesenteric artery and a selective inferior mesenteric arteriogram was performed. Using a 2.5 Fr lantern microcatheter and microwire, access into the distal LEFT colic/sigmoid artery branch vessels was performed and selective arteriography was obtained to identify the bleeding vessel. Super selective coil embolization was then performed using multiple non detachable micro coils. A post  embolization arteriogram was performed without evidence of residual extravasation. Images were reviewed and the procedure was terminated. All wires, catheters and sheaths were removed from the patient. Hemostasis was achieved at the RIGHT groin access site with Angio-Seal closure device. The patient tolerated the procedure well without immediate post procedural complication. FINDINGS: - access via the RIGHT femoral artery. - Focus of active extravasation from IMA territory, distal LEFT colic / sigmoid artery branch vessel - Successful embolization without residual extravasation - Angio-Seal closure at RIGHT groin. Palpable RLE pulses at the end of the Case IMPRESSION: Successful super-selective coil embolization of focus of active extravasation from IMA territory, distal LEFT colic/sigmoid artery branch vessel, as above. PLAN: - The patient is to remain flat for 2 hours with RIGHT leg straight. - The patient may continue to experience several additional bloody bowel movements however should stabilize in the coming days. Michaelle Birks, MD [facility] Radiology Electronically Signed   By: Michaelle Birks M.D.   On: 05/06/2022 00:41   IR US Guide Vasc Access Right  Result Date: 05/06/2022 INDICATION: Hematochezia.  Hypotension. EXAM: Procedures: 1. ABDOMINAL AORTOGRAPHY 2. INFERIOR MESENTERIC ARTERY ARTERIOGRAPHY 3. COIL EMBOLIZATION OF ACTIVE EXTRAVASATION AT DISTAL DESCENDING COLON / PROXIMAL SIGMOID COLON COMPARISON:  CT AP, earlier same day. MEDICATIONS: Benadryl 25 mg IV.  Ancef 2 g IV. ANESTHESIA/SEDATION: Moderate (conscious) sedation was employed during this procedure. A total of Versed 2 mg and  Fentanyl 100 mcg was administered intravenously. Moderate Sedation Time: 85 minutes. The patient's level of consciousness and vital signs were monitored continuously by radiology nursing throughout the procedure under my direct supervision. CONTRAST:  65 mL Omnipaque 300  FLUOROSCOPY TIME:  Fluoroscopic dose; 161 mGy COMPLICATIONS: None immediate. PROCEDURE: Informed consent was obtained from the patient and/or patient's representative following explanation of the procedure, risks, benefits and alternatives. All questions were addressed. A time out was performed prior to the initiation of the procedure. Maximal barrier sterile technique utilized including caps, mask, sterile gowns, sterile gloves, large sterile drape, hand hygiene, and Betadine prep. The RIGHT femoral head was marked fluoroscopically. Under sterile conditions and local anesthesia, the RIGHT common femoral artery access was performed with a micropuncture needle. Under direct ultrasound guidance, the RIGHT common femoral was accessed with a micropuncture kit. An ultrasound image was saved for documentation purposes. This allowed for placement of a 5 Fr vascular sheath. A limited arteriogram was performed through the side arm of the sheath confirming appropriate access within the RIGHT common femoral artery. The short sheath was then exchanged for a 5 Fr, 35 cm Brite tip for additional stability. A limited abdominal aortogram was performed to help identify the IMA origin. Over a Bentson wire, a Sos catheter was advanced, reformed, back bled and flushed. The catheter was then utilized to select the inferior mesenteric artery and a selective inferior mesenteric arteriogram was performed. Using a 2.5 Fr lantern microcatheter and microwire, access into the distal LEFT colic/sigmoid artery branch vessels was performed and selective arteriography was obtained to identify the bleeding vessel. Super selective coil embolization was then performed using multiple non detachable micro coils. A post embolization arteriogram was performed without evidence of residual extravasation. Images were reviewed and the procedure was terminated. All wires, catheters and sheaths were removed from the patient. Hemostasis was achieved at the  RIGHT groin access site with Angio-Seal closure device. The patient tolerated the procedure well without immediate post procedural complication. FINDINGS: - access via the RIGHT femoral artery. - Focus of active extravasation from IMA territory, distal LEFT colic / sigmoid artery branch vessel - Successful embolization without residual extravasation - Angio-Seal closure at RIGHT groin. Palpable RLE pulses at the end of the Case IMPRESSION: Successful super-selective coil embolization of focus of active extravasation from IMA territory, distal LEFT colic/sigmoid artery branch vessel, as above. PLAN: - The patient is to remain flat for 2 hours with RIGHT leg straight. - The patient may continue to experience several additional bloody bowel movements however should stabilize in the coming days. Michaelle Birks, MD [facility] Radiology Electronically Signed   By: Michaelle Birks M.D.   On: 05/06/2022 00:41   IR Angiogram Visceral Selective  Result Date: 05/06/2022 INDICATION: Hematochezia.  Hypotension. EXAM: Procedures: 1. ABDOMINAL AORTOGRAPHY 2. INFERIOR MESENTERIC ARTERY ARTERIOGRAPHY 3. COIL EMBOLIZATION OF ACTIVE EXTRAVASATION AT DISTAL DESCENDING COLON / PROXIMAL SIGMOID COLON COMPARISON:  CT AP, earlier same day. MEDICATIONS: Benadryl 25 mg IV.  Ancef 2 g IV. ANESTHESIA/SEDATION: Moderate (conscious) sedation was employed during this procedure. A total of Versed 2 mg and Fentanyl 100 mcg was administered intravenously. Moderate Sedation Time: 85 minutes. The patient's level of consciousness and vital signs were monitored continuously by radiology nursing throughout the procedure under my direct supervision. CONTRAST:  65 mL Omnipaque 300 FLUOROSCOPY TIME:  Fluoroscopic dose; 096 mGy COMPLICATIONS: None immediate. PROCEDURE: Informed consent was obtained from the patient and/or patient's representative following explanation of the procedure,  risks, benefits and  alternatives. All questions were addressed. A time out was performed prior to the initiation of the procedure. Maximal barrier sterile technique utilized including caps, mask, sterile gowns, sterile gloves, large sterile drape, hand hygiene, and Betadine prep. The RIGHT femoral head was marked fluoroscopically. Under sterile conditions and local anesthesia, the RIGHT common femoral artery access was performed with a micropuncture needle. Under direct ultrasound guidance, the RIGHT common femoral was accessed with a micropuncture kit. An ultrasound image was saved for documentation purposes. This allowed for placement of a 5 Fr vascular sheath. A limited arteriogram was performed through the side arm of the sheath confirming appropriate access within the RIGHT common femoral artery. The short sheath was then exchanged for a 5 Fr, 35 cm Brite tip for additional stability. A limited abdominal aortogram was performed to help identify the IMA origin. Over a Bentson wire, a Sos catheter was advanced, reformed, back bled and flushed. The catheter was then utilized to select the inferior mesenteric artery and a selective inferior mesenteric arteriogram was performed. Using a 2.5 Fr lantern microcatheter and microwire, access into the distal LEFT colic/sigmoid artery branch vessels was performed and selective arteriography was obtained to identify the bleeding vessel. Super selective coil embolization was then performed using multiple non detachable micro coils. A post embolization arteriogram was performed without evidence of residual extravasation. Images were reviewed and the procedure was terminated. All wires, catheters and sheaths were removed from the patient. Hemostasis was achieved at the RIGHT groin access site with Angio-Seal closure device. The patient tolerated the procedure well without immediate post procedural complication. FINDINGS: - access via the RIGHT femoral artery. - Focus of active extravasation from  IMA territory, distal LEFT colic / sigmoid artery branch vessel - Successful embolization without residual extravasation - Angio-Seal closure at RIGHT groin. Palpable RLE pulses at the end of the Case IMPRESSION: Successful super-selective coil embolization of focus of active extravasation from IMA territory, distal LEFT colic/sigmoid artery branch vessel, as above. PLAN: - The patient is to remain flat for 2 hours with RIGHT leg straight. - The patient may continue to experience several additional bloody bowel movements however should stabilize in the coming days. Michaelle Birks, MD [facility] Radiology Electronically Signed   By: Michaelle Birks M.D.   On: 05/06/2022 00:41   IR Aortagram Abdominal Serialogram  Result Date: 05/06/2022 INDICATION: Hematochezia.  Hypotension. EXAM: Procedures: 1. ABDOMINAL AORTOGRAPHY 2. INFERIOR MESENTERIC ARTERY ARTERIOGRAPHY 3. COIL EMBOLIZATION OF ACTIVE EXTRAVASATION AT DISTAL DESCENDING COLON / PROXIMAL SIGMOID COLON COMPARISON:  CT AP, earlier same day. MEDICATIONS: Benadryl 25 mg IV.  Ancef 2 g IV. ANESTHESIA/SEDATION: Moderate (conscious) sedation was employed during this procedure. A total of Versed 2 mg and Fentanyl 100 mcg was administered intravenously. Moderate Sedation Time: 85 minutes. The patient's level of consciousness and vital signs were monitored continuously by radiology nursing throughout the procedure under my direct supervision. CONTRAST:  65 mL Omnipaque 300 FLUOROSCOPY TIME:  Fluoroscopic dose; 660 mGy COMPLICATIONS: None immediate. PROCEDURE: Informed consent was obtained from the patient and/or patient's representative following explanation of the procedure, risks, benefits and alternatives. All questions were addressed. A time out was performed prior to the initiation of the procedure. Maximal barrier sterile technique utilized including caps, mask, sterile gowns, sterile gloves, large sterile drape, hand  hygiene, and Betadine prep. The RIGHT femoral head was marked fluoroscopically. Under sterile conditions and local anesthesia, the RIGHT common femoral artery access was performed with a  micropuncture needle. Under direct ultrasound guidance, the RIGHT common femoral was accessed with a micropuncture kit. An ultrasound image was saved for documentation purposes. This allowed for placement of a 5 Fr vascular sheath. A limited arteriogram was performed through the side arm of the sheath confirming appropriate access within the RIGHT common femoral artery. The short sheath was then exchanged for a 5 Fr, 35 cm Brite tip for additional stability. A limited abdominal aortogram was performed to help identify the IMA origin. Over a Bentson wire, a Sos catheter was advanced, reformed, back bled and flushed. The catheter was then utilized to select the inferior mesenteric artery and a selective inferior mesenteric arteriogram was performed. Using a 2.5 Fr lantern microcatheter and microwire, access into the distal LEFT colic/sigmoid artery branch vessels was performed and selective arteriography was obtained to identify the bleeding vessel. Super selective coil embolization was then performed using multiple non detachable micro coils. A post embolization arteriogram was performed without evidence of residual extravasation. Images were reviewed and the procedure was terminated. All wires, catheters and sheaths were removed from the patient. Hemostasis was achieved at the RIGHT groin access site with Angio-Seal closure device. The patient tolerated the procedure well without immediate post procedural complication. FINDINGS: - access via the RIGHT femoral artery. - Focus of active extravasation from IMA territory, distal LEFT colic / sigmoid artery branch vessel - Successful embolization without residual extravasation - Angio-Seal closure at RIGHT groin. Palpable RLE pulses at the end of the Case IMPRESSION: Successful  super-selective coil embolization of focus of active extravasation from IMA territory, distal LEFT colic/sigmoid artery branch vessel, as above. PLAN: - The patient is to remain flat for 2 hours with RIGHT leg straight. - The patient may continue to experience several additional bloody bowel movements however should stabilize in the coming days. Michaelle Birks, MD [facility] Radiology Electronically Signed   By: Michaelle Birks M.D.   On: 05/06/2022 00:41   CT ANGIO GI BLEED  Result Date: 05/05/2022 CLINICAL DATA:  Lower GI bleeding. EXAM: CTA ABDOMEN AND PELVIS WITHOUT AND WITH CONTRAST TECHNIQUE: Multidetector CT imaging of the abdomen and pelvis was performed using the standard protocol during bolus administration of intravenous contrast. Multiplanar reconstructed images and MIPs were obtained and reviewed to evaluate the vascular anatomy. RADIATION DOSE REDUCTION: This exam was performed according to the departmental dose-optimization program which includes automated exposure control, adjustment of the mA and/or kV according to patient size and/or use of iterative reconstruction technique. CONTRAST:  143mL OMNIPAQUE IOHEXOL 350 MG/ML SOLN COMPARISON:  PET-CT 02/22/2022. FINDINGS: VASCULAR Aorta: Normal caliber aorta without aneurysm, dissection, vasculitis or significant stenosis. There are atherosclerotic calcifications throughout the aorta. Celiac: Patent without evidence of aneurysm, dissection, vasculitis or significant stenosis. SMA: Patent without evidence of aneurysm, dissection, vasculitis or significant stenosis. Renals: Both renal arteries are patent without evidence of aneurysm, dissection, vasculitis, fibromuscular dysplasia or significant stenosis. IMA: Patent without evidence of aneurysm, dissection, vasculitis or significant stenosis. Inflow: Patent without evidence of aneurysm, dissection, vasculitis or significant stenosis. Atherosclerotic  calcifications are present. Proximal Outflow: Bilateral common femoral and visualized portions of the superficial and profunda femoral arteries are patent without evidence of aneurysm, dissection, vasculitis or significant stenosis. Veins: Within normal limits. Review of the MIP images confirms the above findings. NON-VASCULAR Lower chest: Scarring in the right lung base is unchanged. Hepatobiliary: There are 3 subcentimeter rounded hypodensities in the right lobe of the liver. The larger are unchanged from prior in  the smaller are too small to characterize. These are favored as cysts or hemangiomas. Gallbladder surgically absent. There is no biliary ductal dilatation. Pancreas: Unremarkable. No pancreatic ductal dilatation or surrounding inflammatory changes. Spleen: Normal in size without focal abnormality. Adrenals/Urinary Tract: There are subcentimeter cortical cysts in each kidney. Otherwise, the kidneys, adrenal glands and bladder are within normal limits. Stomach/Bowel: There is active arterial extravasation of contrast/bleeding within the descending/sigmoid colon junction. There is no focal wall thickening or inflammation in this region. There is no bowel obstruction or free air. There is diffuse colonic diverticulosis. The appendix, stomach and small bowel loops appear normal. Lymphatic: No enlarged lymph nodes are identified. Reproductive: Status post hysterectomy. No adnexal masses. Other: No abdominal wall hernia or abnormality. No abdominopelvic ascites. Musculoskeletal: Degenerative changes affect the spine. IMPRESSION: 1. Active bleeding within the descending/sigmoid colon junction. No focal wall thickening or inflammation. Aortic Atherosclerosis (ICD10-I70.0). NON-VASCULAR 1. No other acute localizing process in the abdomen or pelvis. 2. Subcentimeter Bosniak II benign renal cyst. No follow-up imaging is recommended. JACR 2018 Feb; [id number], Management of the Incidental Renal Mass on CT, [employer]  2021; [phone], Bosniak Classification of Cystic Renal Masses, Version 2019. Electronically Signed   By: Ronney Asters M.D.   On: 05/05/2022 18:08   DG Chest Port 1 View  Result Date: 05/05/2022 CLINICAL DATA:  Weakness. EXAM: PORTABLE CHEST 1 VIEW COMPARISON:  Chest radiograph dated 12/09/2021 and CT chest dated 02/04/2022. FINDINGS: The heart size and mediastinal contours are within normal limits. Vascular calcifications are seen in the aortic arch. The patient's known bilateral pulmonary nodules are better seen on prior CT. No new focal consolidation, pleural effusion, or pneumothorax is identified. The visualized skeletal structures are unremarkable. A left subclavian approach cardiac device and median sternotomy wires are redemonstrated. IMPRESSION: No acute pulmonary process. The patient's known bilateral pulmonary nodules are better seen on prior CT. Electronically Signed   By: Zerita Boers M.D.   On: 05/05/2022 14:36    Labs:  CBC: Recent Labs    05/05/22 1354 05/05/22 2201 05/06/22 0317 05/06/22 1151  WBC 4.8 5.2 6.9 7.2  HGB 10.0* 8.8* 11.5* 10.1*  HCT 32.0* 27.6* 36.8 31.2*  PLT 146* 142* 142* 132*    COAGS: No results for input(s): "INR", "APTT" in the last 8760 hours.  BMP: Recent Labs    12/09/21 1950 02/04/22 1426 05/05/22 1354 05/06/22 0317  NA 140 140 140 136  K 4.0 4.2 3.8 4.0  CL 108 104 106 111  CO2 24 32 26 18*  GLUCOSE 124* 98 111* 103*  BUN 14 14 12 17   CALCIUM 9.0 9.5 8.6* 8.2*  CREATININE 1.38* 1.31* 1.20* 1.12*  GFRNONAA 40* 42* 47* 51*    LIVER FUNCTION TESTS: Recent Labs    11/29/21 1151 02/04/22 1426 05/05/22 1354 05/06/22 0317  BILITOT 0.5 0.4 0.6 0.8  AST 11* 12* 16 14*  ALT 8 11 13 10   ALKPHOS 72 80 55 52  PROT 7.1 7.1 5.3* 4.8*  ALBUMIN 4.3 4.2 3.2* 3.1*    Assessment and Plan: GI bleed s/p embolization of distal LEFT colic / sigmoid artery branch vessel  Patient resting comfortably in ED. No further bleeding since  embolization.  Has not passed bowel movements since embo at all.  Hgb 10.1.  Procedure site intact.  No issues noted.   IR remains available if needed.  Electronically Signed: Docia Barrier, PA 05/06/2022, 3:17 PM   I spent a total of 15 Minutes at the  the patient's bedside AND on the patient's hospital floor or unit, greater than 50% of which was counseling/coordinating care for GI bleed.

## 2022-05-07 LAB — BASIC METABOLIC PANEL
Anion gap: 9 (ref 5–15)
BUN: 11 mg/dL (ref 8–23)
CO2: 21 mmol/L — ABNORMAL LOW (ref 22–32)
Calcium: 8.2 mg/dL — ABNORMAL LOW (ref 8.9–10.3)
Chloride: 107 mmol/L (ref 98–111)
Creatinine, Ser: 0.98 mg/dL (ref 0.44–1.00)
GFR, Estimated: 60 mL/min — ABNORMAL LOW (ref >60)
Glucose, Bld: 100 mg/dL — ABNORMAL HIGH (ref 70–99)
Potassium: 3.2 mmol/L — ABNORMAL LOW (ref 3.5–5.1)
Sodium: 137 mmol/L (ref 135–145)

## 2022-05-07 LAB — CBC
HCT: 30.7 % — ABNORMAL LOW (ref 36.0–46.0)
Hemoglobin: 10 g/dL — ABNORMAL LOW (ref 12.0–15.0)
MCH: 31.9 pg (ref 26.0–34.0)
MCHC: 32.6 g/dL (ref 30.0–36.0)
MCV: 98.1 fL (ref 80.0–100.0)
Platelets: 124 10*3/uL — ABNORMAL LOW (ref 150–400)
RBC: 3.13 MIL/uL — ABNORMAL LOW (ref 3.87–5.11)
RDW: 12.9 % (ref 11.5–15.5)
WBC: 5.9 10*3/uL (ref 4.0–10.5)
nRBC: 0 % (ref 0.0–0.2)

## 2022-05-07 LAB — LACTIC ACID, PLASMA: Lactic Acid, Venous: 1.5 mmol/L (ref 0.5–1.9)

## 2022-05-07 LAB — MAGNESIUM: Magnesium: 2.1 mg/dL (ref 1.7–2.4)

## 2022-05-07 MED ORDER — SUCRALFATE 1 GM/10ML PO SUSP
1.0000 g | Freq: Two times a day (BID) | ORAL | Status: DC
  Administered 2022-05-07 – 2022-05-16 (×17): 1 g via ORAL
  Filled 2022-05-07 (×19): qty 10

## 2022-05-07 MED ORDER — PANTOPRAZOLE SODIUM 40 MG PO TBEC: 40.0000 mg | DELAYED_RELEASE_TABLET | Freq: Every day | ORAL | Status: DC

## 2022-05-07 MED ORDER — TRAMADOL HCL 50 MG PO TABS: 50.0000 mg | ORAL_TABLET | Freq: Four times a day (QID) | ORAL | Status: DC | PRN

## 2022-05-07 MED ORDER — PANTOPRAZOLE SODIUM 40 MG PO TBEC
40.0000 mg | DELAYED_RELEASE_TABLET | Freq: Two times a day (BID) | ORAL | Status: DC
  Administered 2022-05-07 – 2022-05-16 (×17): 40 mg via ORAL
  Filled 2022-05-07 (×17): qty 1

## 2022-05-07 MED ORDER — POTASSIUM CHLORIDE CRYS ER 20 MEQ PO TBCR
40.0000 meq | EXTENDED_RELEASE_TABLET | Freq: Once | ORAL | Status: AC
  Administered 2022-05-07: 40 meq via ORAL
  Filled 2022-05-07: qty 2

## 2022-05-07 MED ORDER — OXYCODONE-ACETAMINOPHEN 7.5-325 MG PO TABS
1.0000 | ORAL_TABLET | Freq: Four times a day (QID) | ORAL | Status: DC | PRN
  Administered 2022-05-08 – 2022-05-11 (×4): 1 via ORAL
  Filled 2022-05-07 (×4): qty 1

## 2022-05-07 NOTE — TOC CM/SW Note (Signed)
  Transition of Care [facility]) Screening Note   Patient Details  Name: Karen [name] Date of Birth: [birth year]    Transition of Care Department [facility]) has reviewed patient and no TOC needs have been identified at this time. We will continue to monitor patient advancement through interdisciplinary progression rounds. If new patient transition needs arise, please place a TOC consult.

## 2022-05-07 NOTE — Plan of Care (Signed)
  Problem: Clinical Measurements: Goal: Respiratory complications will improve Outcome: Progressing Goal: Cardiovascular complication will be avoided Outcome: Progressing   Problem: Coping: Goal: Level of anxiety will decrease Outcome: Progressing   Problem: Elimination: Goal: Will not experience complications related to urinary retention Outcome: Progressing   Problem: Pain Managment: Goal: General experience of comfort will improve Outcome: Progressing

## 2022-05-07 NOTE — Plan of Care (Signed)
  Problem: Cardiovascular: Goal: Vascular access site(s) Level 0-1 will be maintained Outcome: Progressing   Problem: Clinical Measurements: Goal: Respiratory complications will improve Outcome: Progressing Goal: Cardiovascular complication will be avoided Outcome: Progressing   Problem: Elimination: Goal: Will not experience complications related to urinary retention Outcome: Progressing   Problem: Pain Managment: Goal: General experience of comfort will improve Outcome: Progressing

## 2022-05-07 NOTE — Progress Notes (Signed)
[facility] PROGRESS NOTE  Karen [name] (DOB: [birth year]) IYM:[id number] PCP: [facility]  Brief Narrative: Karen [name] is a 77 y.o. 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 Hemoglobin down from normal 12.9 to 10g/dl and subsequently 8.8 with active bleeding identified on CT angiogram. Underwent embolization of the distal left colic/sigmoid branch artery with resolution of bleeding. Hemoglobin has stabilized.   Subjective: Still no further BMs. Abdominal pain reported to be worse and having heartburn. Also having low back discomfort and a headache, usually takes percocet at home and hasn't had it since being here.  Objective: BP (!) 118/57 (BP Location: Right Arm)   Pulse 90   Temp 99.6 F (37.6 C) (Oral)   Resp (!) 26   Ht 5\' 3"  (1.6 m)   Wt 70 kg   SpO2 100%   BMI 27.34 kg/m   Gen: No distress Pulm: Clear, nonlabored  CV: RRR, no MRG or edema GI: Soft, tender pretty significantly in LLQ without rebound or distention.  Neuro: Alert and oriented. No new focal deficits. Ext: Warm, no deformities Skin: No rashes, lesions or ulcers on visualized skin   Assessment & Plan: Principal Problem:   Lower GI bleed Active Problems:   Adenocarcinoma, lung (HCC)   Tobacco use   Essential hypertension   Coronary artery disease involving native heart with angina pectoris (HCC)   VENTRICULAR FIBRILLATION   Stage 2 moderate COPD by GOLD classification (HCC)   Chest pain   Prolonged QT interval  Acute blood loss anemia due to diverticular GI bleed:  - s/p 1u PRBC 1/7. Hgb baseline seems to be normal, last value 12.9g/dl. Has rebounded appropriately. - IR available as needed, s/p distal left colic/sigmoid artery branch vessel embolization by Dr. Maryelizabeth Kaufmann.  - Will continue holding antiplatelet plavix until 1/10 per GI.  - CLD can advance  today to soft per GI. - Abdominal pain is worse per pt, lactic acid checked and is normal. If improved/stable and taking po without further evidence of bleeding, should be able to discharge.   Thrombocytopenia: New. - Of course, holding anticoagulation. SCDs.  - Trending CBC. Unclear utility of IPF in setting of transfusion.  CAD, hx CABG: Troponin is normal. Remarkably low given the GI bleed, anemia.  - Holding plavix as above, will restart 1/10.  - Holding metoprolol, imdur for now.  - Holding statin  History of CHF with recovered EF, HTN: LVEF 50-55%.  - Euvolemic. BP remains low-normal so not restarting antihypertensives at this time.  Hx VF arrest s/p AICD. K is 4.0, Mg 1.7 - Supp K  Hypokalemia - Supplement and monitor.  COPD: Quiescent.  - Continue controller meds (formulary equivalent) and prn albuterol  History of left lung CA s/p SBRT 2022.   Stage IIIb CKD: Based on baseline CrCl 30-76ml/min.   HLD:  - Statin  Vitamin B12 deficiency: Marginal B12 level and macrocytosis noted.  - Supplement  Tobacco use:  - Cessation counseling provided - Nicotine patch  GERD, chronic dysphagia:  - Continue with plans for EGD with [facility].    Patrecia Pour, MD [facility] [email or web address] 05/07/2022, 4:16 PM

## 2022-05-07 NOTE — Progress Notes (Addendum)
Daily Rounding Note  05/07/2022, 11:45 AM  LOS: 2 days   SUBJECTIVE:   Chief complaint:    Painless hematochezia, presumed diverticular bleed.  Status post embolization.  No further stools or bleeding.  Her last episodes were prior to transport to the hospital.  Some left-sided abdominal discomfort.  Still with significant heartburn.  Sudden nausea without emesis this morning  OBJECTIVE:         Vital signs in last 24 hours:    Temp:  [98 F (36.7 C)-100.1 F (37.8 C)] 100 F (37.8 C) (01/09 0725) Pulse Rate:  [73-95] 89 (01/09 0725) Resp:  [5-29] 18 (01/09 0725) BP: (116-144)/(49-66) 134/57 (01/09 0725) SpO2:  [93 %-100 %] 99 % (01/09 0817) Weight:  [70 kg] 70 kg (01/08 2055) Last BM Date : 05/06/22 Filed Weights   05/06/22 2055  Weight: 70 kg   General: Looks poorly.  Comfortable, alert. Heart: RRR. Chest: Labored breathing or cough. Abdomen: Soft.  No distention.  Minor tenderness which is nonfocal, bilaterally and present in all 4 quadrants.  No masses, bruits, HSM.  Bowel sounds active. Extremities: No CCE. Neuro/Psych: Alert.  Oriented x 3.  Moves all 4 limbs.  Intake/Output from previous day: 01/08 0701 - 01/09 0700 In: 1216.7 [P.O.:240; I.V.:926.7; IV Piggyback:50] Out: 800 [Urine:800]  Intake/Output this shift: No intake/output data recorded.  Lab Results: Recent Labs    05/06/22 1500 05/06/22 2138 05/07/22 0048  WBC 6.9 7.6 5.9  HGB 10.2* 10.7* 10.0*  HCT 30.6* 31.5* 30.7*  PLT 108* 129* 124*   BMET Recent Labs    05/05/22 1354 05/06/22 0317 05/07/22 0048  NA 140 136 137  K 3.8 4.0 3.2*  CL 106 111 107  CO2 26 18* 21*  GLUCOSE 111* 103* 100*  BUN 12 17 11   CREATININE 1.20* 1.12* 0.98  CALCIUM 8.6* 8.2* 8.2*   LFT Recent Labs    05/05/22 1354 05/06/22 0317  PROT 5.3* 4.8*  ALBUMIN 3.2* 3.1*  AST 16 14*  ALT 13 10  ALKPHOS 55 52  BILITOT 0.6 0.8   PT/INR No results for  input(s): "LABPROT", "INR" in the last 72 hours. Hepatitis Panel No results for input(s): "HEPBSAG", "HCVAB", "HEPAIGM", "HEPBIGM" in the last 72 hours.  Studies/Results: IR EMBO ART  VEN HEMORR LYMPH EXTRAV  INC GUIDE ROADMAPPING  Result Date: 05/06/2022 INDICATION: Hematochezia.  Hypotension. EXAM: Procedures: 1. ABDOMINAL AORTOGRAPHY 2. INFERIOR MESENTERIC ARTERY ARTERIOGRAPHY 3. COIL EMBOLIZATION OF ACTIVE EXTRAVASATION AT DISTAL DESCENDING COLON / PROXIMAL SIGMOID COLON COMPARISON:  CT AP, earlier same day. MEDICATIONS: Benadryl 25 mg IV.  Ancef 2 g IV. ANESTHESIA/SEDATION: Moderate (conscious) sedation was employed during this procedure. A total of Versed 2 mg and Fentanyl 100 mcg was administered intravenously. Moderate Sedation Time: 85 minutes. The patient's level of consciousness and vital signs were monitored continuously by radiology nursing throughout the procedure under my direct supervision. CONTRAST:  65 mL Omnipaque 300 FLUOROSCOPY TIME:  Fluoroscopic dose; 284 mGy COMPLICATIONS: None immediate. PROCEDURE: Informed consent was obtained from the patient and/or patient's representative following explanation of the procedure, risks, benefits and alternatives. All questions were addressed. A time out was performed prior to the initiation of the procedure. Maximal barrier sterile technique utilized including caps, mask, sterile gowns, sterile gloves, large sterile drape, hand hygiene, and Betadine prep. The RIGHT femoral head was marked fluoroscopically. Under sterile conditions and local anesthesia, the RIGHT common femoral artery access was performed with a micropuncture  needle. Under direct ultrasound guidance, the RIGHT common femoral was accessed with a micropuncture kit. An ultrasound image was saved for documentation purposes. This allowed for placement of a 5 Fr vascular sheath. A limited arteriogram was performed through the side arm of the sheath confirming appropriate access within the  RIGHT common femoral artery. The short sheath was then exchanged for a 5 Fr, 35 cm Brite tip for additional stability. A limited abdominal aortogram was performed to help identify the IMA origin. Over a Bentson wire, a Sos catheter was advanced, reformed, back bled and flushed. The catheter was then utilized to select the inferior mesenteric artery and a selective inferior mesenteric arteriogram was performed. Using a 2.5 Fr lantern microcatheter and microwire, access into the distal LEFT colic/sigmoid artery branch vessels was performed and selective arteriography was obtained to identify the bleeding vessel. Super selective coil embolization was then performed using multiple non detachable micro coils. A post embolization arteriogram was performed without evidence of residual extravasation. Images were reviewed and the procedure was terminated. All wires, catheters and sheaths were removed from the patient. Hemostasis was achieved at the RIGHT groin access site with Angio-Seal closure device. The patient tolerated the procedure well without immediate post procedural complication. FINDINGS: - access via the RIGHT femoral artery. - Focus of active extravasation from IMA territory, distal LEFT colic / sigmoid artery branch vessel - Successful embolization without residual extravasation - Angio-Seal closure at RIGHT groin. Palpable RLE pulses at the end of the Case IMPRESSION: Successful super-selective coil embolization of focus of active extravasation from IMA territory, distal LEFT colic/sigmoid artery branch vessel, as above. PLAN: - The patient is to remain flat for 2 hours with RIGHT leg straight. - The patient may continue to experience several additional bloody bowel movements however should stabilize in the coming days. Michaelle Birks, MD [facility] Electronically Signed   By: Michaelle Birks M.D.   On: 05/06/2022 00:41    CT ANGIO GI BLEED  Result  Date: 05/05/2022 CLINICAL DATA:  Lower GI bleeding. EXAM: CTA ABDOMEN AND PELVIS WITHOUT AND WITH CONTRAST TECHNIQUE: Multidetector CT imaging of the abdomen and pelvis was performed using the standard protocol during bolus administration of intravenous contrast. Multiplanar reconstructed images and MIPs were obtained and reviewed to evaluate the vascular anatomy. RADIATION DOSE REDUCTION: This exam was performed according to the departmental dose-optimization program which includes automated exposure control, adjustment of the mA and/or kV according to patient size and/or use of iterative reconstruction technique. CONTRAST:  118mL OMNIPAQUE IOHEXOL 350 MG/ML SOLN COMPARISON:  PET-CT 02/22/2022. FINDINGS: VASCULAR Aorta: Normal caliber aorta without aneurysm, dissection, vasculitis or significant stenosis. There are atherosclerotic calcifications throughout the aorta. Celiac: Patent without evidence of aneurysm, dissection, vasculitis or significant stenosis. SMA: Patent without evidence of aneurysm, dissection, vasculitis or significant stenosis. Renals: Both renal arteries are patent without evidence of aneurysm, dissection, vasculitis, fibromuscular dysplasia or significant stenosis. IMA: Patent without evidence of aneurysm, dissection, vasculitis or significant stenosis. Inflow: Patent without evidence of aneurysm, dissection, vasculitis or significant stenosis. Atherosclerotic calcifications are present. Proximal Outflow: Bilateral common femoral and visualized portions of the superficial and profunda femoral arteries are patent without evidence of aneurysm, dissection, vasculitis or significant stenosis. Veins: Within normal limits. Review of the MIP images confirms the above findings. NON-VASCULAR Lower chest: Scarring in the right lung base is unchanged. Hepatobiliary: There are 3 subcentimeter rounded hypodensities in the right lobe of the liver. The larger are unchanged from prior in  the smaller are too  small to characterize. These are favored as cysts or hemangiomas. Gallbladder surgically absent. There is no biliary ductal dilatation. Pancreas: Unremarkable. No pancreatic ductal dilatation or surrounding inflammatory changes. Spleen: Normal in size without focal abnormality. Adrenals/Urinary Tract: There are subcentimeter cortical cysts in each kidney. Otherwise, the kidneys, adrenal glands and bladder are within normal limits. Stomach/Bowel: There is active arterial extravasation of contrast/bleeding within the descending/sigmoid colon junction. There is no focal wall thickening or inflammation in this region. There is no bowel obstruction or free air. There is diffuse colonic diverticulosis. The appendix, stomach and small bowel loops appear normal. Lymphatic: No enlarged lymph nodes are identified. Reproductive: Status post hysterectomy. No adnexal masses. Other: No abdominal wall hernia or abnormality. No abdominopelvic ascites. Musculoskeletal: Degenerative changes affect the spine. IMPRESSION: 1. Active bleeding within the descending/sigmoid colon junction. No focal wall thickening or inflammation. Aortic Atherosclerosis (ICD10-I70.0). NON-VASCULAR 1. No other acute localizing process in the abdomen or pelvis. 2. Subcentimeter Bosniak II benign renal cyst. No follow-up imaging is recommended. JACR 2018 Feb; 264-273, Management of the Incidental Renal Mass on CT, [employer] 2021; 814-848, Bosniak Classification of Cystic Renal Masses, Version 2019. Electronically Signed   By: Ronney Asters M.D.   On: 05/05/2022 18:08   DG Chest Port 1 View  Result Date: 05/05/2022 CLINICAL DATA:  Weakness. EXAM: PORTABLE CHEST 1 VIEW COMPARISON:  Chest radiograph dated 12/09/2021 and CT chest dated 02/04/2022. FINDINGS: The heart size and mediastinal contours are within normal limits. Vascular calcifications are seen in the aortic arch. The patient's known bilateral pulmonary nodules are better seen on prior CT. No new  focal consolidation, pleural effusion, or pneumothorax is identified. The visualized skeletal structures are unremarkable. A left subclavian approach cardiac device and median sternotomy wires are redemonstrated. IMPRESSION: No acute pulmonary process. The patient's known bilateral pulmonary nodules are better seen on prior CT. Electronically Signed   By: Zerita Boers M.D.   On: 05/05/2022 14:36    Scheduled Meds:  arformoterol  15 mcg Nebulization BID   And   umeclidinium bromide  1 puff Inhalation Daily   cyanocobalamin  1,000 mcg Intramuscular Daily   nicotine  21 mg Transdermal Daily   pantoprazole (PROTONIX) IV  40 mg Intravenous Q12H   Continuous Infusions: PRN Meds:.acetaminophen, albuterol, oxyCODONE-acetaminophen  ASSESMENT:      Lower GI bleed.  Presumed diverticular bleed.  SP coil embolization of left colic/sigmoid artery branch vessel.  At CTAP angio there was active bleeding at the descending/sigmoid colon junction.  Nonvascular CTAP findings of diffuse colonic diverticulosis.  Stable hypodensities in the right lobe of liver.   Blood loss anemia.  Excellent response to 1 PRBC. Elevated iron, iron sats, normal TIBC, folate, b12, ferritin.  Hgb 8.8.Marland Kitchen 10.      Thrombocytopenia, non-critical.  124 k.     Longstanding, chronic, somewhat intermittent solid/liquid dysphagia.  Started opn PPI late last week.  Was for EGD as well as colonoscopy this coming Friday with GI at [facility], Pt cxld these and awaits date for reschedule.     Chronic Plavix.  On hold.  History CAD, CABG.  Last dose AM 1/7.  Latest echocardiogram 03/29/2022.  LVEF 50 to 55%. Normal left ventricular diastolic parameters.  No valvular disease or regurgitation.   Longstanding solid/liquid dysphagia.  Was for EGD and colonoscopy w Dr Alan Ripper at [place] on 1/12.  This needs rescheduled.      Hypokalemia.  K 3.2   PLAN  No need of Protonix IV, so stopping this.  She had just initiated on Protonix  late last week so we will resume this.    Added Carafate bid.      Advance to soft diet.      Determine date for restart Plavix.  Favor holding for at least another 3 days.      Azucena Freed  05/07/2022, 11:45 AM Phone [phone]    Attending physician's note   I have taken history, reviewed the chart and examined the patient. I performed a substantive portion of this encounter, including complete performance of at least one of the key components, in conjunction with the APP. I agree with the Advanced Practitioner's note, impression and recommendations.   No further bleeding. Hb stable. Does have mild LLQ tenderness without rebound.  She had this in past as well.  WBC count is normal-doubt ischemic bowel.  Plan: Advance diet. FU @[facility] for EGD/colonoscopy (this was scheduled for this week-she will reschedule) Continue p.o. Protonix Resume Plavix 1/10 Will sign off for now.   Carmell Austria, MD [facility] [phone]

## 2022-05-08 ENCOUNTER — Encounter (HOSPITAL_COMMUNITY)
Admission: EM | Disposition: A | Discharge status: Home Health | Payer: Self-pay | Source: Home / Self Care | Attending: Family Medicine

## 2022-05-08 ENCOUNTER — Ambulatory Visit (HOSPITAL_COMMUNITY): Payer: Medicare Other

## 2022-05-08 DIAGNOSIS — I2119 ST elevation (STEMI) myocardial infarction involving other coronary artery of inferior wall: Secondary | ICD-10-CM

## 2022-05-08 DIAGNOSIS — I25709 Atherosclerosis of coronary artery bypass graft(s), unspecified, with unspecified angina pectoris: Secondary | ICD-10-CM

## 2022-05-08 HISTORY — PX: LEFT HEART CATH AND CORONARY ANGIOGRAPHY: CATH118249

## 2022-05-08 HISTORY — PX: CORONARY/GRAFT ACUTE MI REVASCULARIZATION: CATH118305

## 2022-05-08 LAB — CBC
HCT: 32 % — ABNORMAL LOW (ref 36.0–46.0)
Hemoglobin: 10.6 g/dL — ABNORMAL LOW (ref 12.0–15.0)
MCH: 31.8 pg (ref 26.0–34.0)
MCHC: 33.1 g/dL (ref 30.0–36.0)
MCV: 96.1 fL (ref 80.0–100.0)
Platelets: 119 10*3/uL — ABNORMAL LOW (ref 150–400)
RBC: 3.33 MIL/uL — ABNORMAL LOW (ref 3.87–5.11)
RDW: 12.7 % (ref 11.5–15.5)
WBC: 4.9 10*3/uL (ref 4.0–10.5)
nRBC: 0 % (ref 0.0–0.2)

## 2022-05-08 LAB — BASIC METABOLIC PANEL
Anion gap: 8 (ref 5–15)
BUN: 11 mg/dL (ref 8–23)
CO2: 24 mmol/L (ref 22–32)
Calcium: 8.7 mg/dL — ABNORMAL LOW (ref 8.9–10.3)
Chloride: 107 mmol/L (ref 98–111)
Creatinine, Ser: 0.94 mg/dL (ref 0.44–1.00)
GFR, Estimated: >60 mL/min (ref >60)
Glucose, Bld: 116 mg/dL — ABNORMAL HIGH (ref 70–99)
Potassium: 3.4 mmol/L — ABNORMAL LOW (ref 3.5–5.1)
Sodium: 139 mmol/L (ref 135–145)

## 2022-05-08 LAB — TROPONIN I (HIGH SENSITIVITY)
Troponin I (High Sensitivity): 33 ng/L — ABNORMAL HIGH (ref <18)
Troponin I (High Sensitivity): 2614 ng/L (ref <18)

## 2022-05-08 LAB — POCT ACTIVATED CLOTTING TIME
Activated Clotting Time: 206 seconds
Activated Clotting Time: 179 seconds

## 2022-05-08 SURGERY — LEFT HEART CATH AND CORONARY ANGIOGRAPHY
Anesthesia: LOCAL

## 2022-05-08 MED ORDER — CLOPIDOGREL BISULFATE 75 MG PO TABS
ORAL_TABLET | ORAL | Status: DC | PRN
  Administered 2022-05-08: 525 mg via ORAL

## 2022-05-08 MED ORDER — HEPARIN (PORCINE) IN NACL 1000-0.9 UT/500ML-% IV SOLN
INTRAVENOUS | Status: DC | PRN
  Administered 2022-05-08 (×3): 500 mL

## 2022-05-08 MED ORDER — SODIUM CHLORIDE 0.9 % IV SOLN: 250.0000 mL | INTRAVENOUS | Status: DC | PRN

## 2022-05-08 MED ORDER — FENTANYL CITRATE (PF) 100 MCG/2ML IJ SOLN
INTRAMUSCULAR | Status: DC | PRN
  Administered 2022-05-08: 25 ug via INTRAVENOUS
  Administered 2022-05-08: 25 ug

## 2022-05-08 MED ORDER — SODIUM CHLORIDE 0.9 % IV SOLN: INTRAVENOUS | Status: AC

## 2022-05-08 MED ORDER — ISOSORBIDE MONONITRATE ER 60 MG PO TB24
60.0000 mg | ORAL_TABLET | Freq: Two times a day (BID) | ORAL | Status: DC
  Administered 2022-05-08 – 2022-05-16 (×17): 60 mg via ORAL
  Filled 2022-05-08 (×17): qty 1

## 2022-05-08 MED ORDER — NITROGLYCERIN 0.4 MG SL SUBL
0.4000 mg | SUBLINGUAL_TABLET | SUBLINGUAL | Status: DC | PRN
  Administered 2022-05-08 – 2022-05-12 (×4): 0.4 mg via SUBLINGUAL
  Filled 2022-05-08 (×3): qty 1

## 2022-05-08 MED ORDER — VITAMIN B-12 1000 MCG PO TABS: 1000.0000 ug | ORAL_TABLET | Freq: Every day | ORAL | 0 refills | Status: AC

## 2022-05-08 MED ORDER — CLOPIDOGREL BISULFATE 75 MG PO TABS
75.0000 mg | ORAL_TABLET | Freq: Every day | ORAL | Status: DC
  Administered 2022-05-08: 75 mg via ORAL
  Filled 2022-05-08: qty 1

## 2022-05-08 MED ORDER — ACETAMINOPHEN 325 MG PO TABS: 650.0000 mg | ORAL_TABLET | ORAL | Status: DC | PRN

## 2022-05-08 MED ORDER — SUCRALFATE 1 GM/10ML PO SUSP: 1.0000 g | Freq: Two times a day (BID) | ORAL | 0 refills | Status: DC

## 2022-05-08 MED ORDER — DIAZEPAM 5 MG PO TABS: 5.0000 mg | ORAL_TABLET | Freq: Three times a day (TID) | ORAL | Status: DC | PRN

## 2022-05-08 MED ORDER — MIDAZOLAM HCL 2 MG/2ML IJ SOLN
INTRAMUSCULAR | Status: AC
  Filled 2022-05-08: qty 2

## 2022-05-08 MED ORDER — FUROSEMIDE 20 MG PO TABS
20.0000 mg | ORAL_TABLET | Freq: Every day | ORAL | Status: DC
  Administered 2022-05-09 – 2022-05-16 (×8): 20 mg via ORAL
  Filled 2022-05-08 (×8): qty 1

## 2022-05-08 MED ORDER — ASPIRIN 81 MG PO CHEW
81.0000 mg | CHEWABLE_TABLET | Freq: Every day | ORAL | Status: DC
  Administered 2022-05-08 – 2022-05-16 (×9): 81 mg via ORAL
  Filled 2022-05-08 (×8): qty 1

## 2022-05-08 MED ORDER — SODIUM CHLORIDE 0.9% FLUSH
3.0000 mL | INTRAVENOUS | Status: DC | PRN
  Administered 2022-05-14: 3 mL via INTRAVENOUS

## 2022-05-08 MED ORDER — PANTOPRAZOLE SODIUM 40 MG PO TBEC: 40.0000 mg | DELAYED_RELEASE_TABLET | Freq: Two times a day (BID) | ORAL | 0 refills | Status: DC

## 2022-05-08 MED ORDER — ASPIRIN 81 MG PO CHEW
CHEWABLE_TABLET | ORAL | Status: AC
  Filled 2022-05-08: qty 1

## 2022-05-08 MED ORDER — LIDOCAINE HCL (PF) 1 % IJ SOLN
INTRAMUSCULAR | Status: DC | PRN
  Administered 2022-05-08 (×2): 5 mL via INTRADERMAL

## 2022-05-08 MED ORDER — ATORVASTATIN CALCIUM 80 MG PO TABS
80.0000 mg | ORAL_TABLET | Freq: Every day | ORAL | Status: DC
  Administered 2022-05-08 – 2022-05-09 (×2): 80 mg via ORAL
  Filled 2022-05-08 (×2): qty 1

## 2022-05-08 MED ORDER — IOHEXOL 350 MG/ML SOLN
INTRAVENOUS | Status: DC | PRN
  Administered 2022-05-08: 190 mL

## 2022-05-08 MED ORDER — FENTANYL CITRATE (PF) 100 MCG/2ML IJ SOLN
INTRAMUSCULAR | Status: AC
  Filled 2022-05-08: qty 2

## 2022-05-08 MED ORDER — HEPARIN (PORCINE) IN NACL 1000-0.9 UT/500ML-% IV SOLN
INTRAVENOUS | Status: AC
  Filled 2022-05-08: qty 1000

## 2022-05-08 MED ORDER — SODIUM CHLORIDE 0.9% FLUSH
3.0000 mL | Freq: Two times a day (BID) | INTRAVENOUS | Status: DC
  Administered 2022-05-08 – 2022-05-16 (×15): 3 mL via INTRAVENOUS

## 2022-05-08 MED ORDER — SODIUM CHLORIDE 0.9 % IV SOLN
INTRAVENOUS | Status: DC | PRN
  Administered 2022-05-08: 10 mL/h via INTRAVENOUS

## 2022-05-08 MED ORDER — CLOPIDOGREL BISULFATE 75 MG PO TABS
75.0000 mg | ORAL_TABLET | Freq: Every day | ORAL | Status: DC
  Administered 2022-05-09 – 2022-05-16 (×8): 75 mg via ORAL
  Filled 2022-05-08 (×8): qty 1

## 2022-05-08 MED ORDER — HEPARIN SODIUM (PORCINE) 1000 UNIT/ML IJ SOLN
INTRAMUSCULAR | Status: DC | PRN
  Administered 2022-05-08: 1500 [IU] via INTRAVENOUS
  Administered 2022-05-08: 7000 [IU] via INTRAVENOUS

## 2022-05-08 MED ORDER — LIDOCAINE HCL (PF) 1 % IJ SOLN
INTRAMUSCULAR | Status: AC
  Filled 2022-05-08: qty 30

## 2022-05-08 MED ORDER — POTASSIUM CHLORIDE CRYS ER 20 MEQ PO TBCR: 20.0000 meq | EXTENDED_RELEASE_TABLET | Freq: Once | ORAL | Status: DC

## 2022-05-08 MED ORDER — HYDRALAZINE HCL 20 MG/ML IJ SOLN: 10.0000 mg | INTRAMUSCULAR | Status: AC | PRN

## 2022-05-08 MED ORDER — MIDAZOLAM HCL 2 MG/2ML IJ SOLN
INTRAMUSCULAR | Status: DC | PRN
  Administered 2022-05-08: 1 mg via INTRAVENOUS

## 2022-05-08 MED ORDER — CLOPIDOGREL BISULFATE 300 MG PO TABS: 600.0000 mg | ORAL_TABLET | Freq: Once | ORAL | Status: AC

## 2022-05-08 MED ORDER — LABETALOL HCL 5 MG/ML IV SOLN: 10.0000 mg | INTRAVENOUS | Status: AC | PRN

## 2022-05-08 SURGICAL SUPPLY — 23 items
BALL SAPPHIRE NC24 2.25X8 (BALLOONS) ×1
BALLN EMERGE MR 2.0X12 (BALLOONS) ×1
BALLOON EMERGE MR 2.0X12 (BALLOONS) IMPLANT
BALLOON SAPPHIRE NC24 2.25X8 (BALLOONS) IMPLANT
CATH INFINITI 5 FR IM (CATHETERS) IMPLANT
CATH INFINITI 5 FR LCB (CATHETERS) IMPLANT
CATH INFINITI 5 FR RCB (CATHETERS) IMPLANT
CATH INFINITI 5FR MULTPACK ANG (CATHETERS) IMPLANT
CATHETER LAUNCHER 6FR RCB (CATHETERS) IMPLANT
ELECT DEFIB PAD ADLT CADENCE (PAD) IMPLANT
KIT ENCORE 26 ADVANTAGE (KITS) IMPLANT
KIT HEART LEFT (KITS) ×1 IMPLANT
PACK CARDIAC CATHETERIZATION (CUSTOM PROCEDURE TRAY) ×1 IMPLANT
SHEATH PINNACLE 6F 10CM (SHEATH) IMPLANT
SHEATH PROBE COVER 6X72 (BAG) IMPLANT
SYR MEDRAD MARK 7 150ML (SYRINGE) ×1 IMPLANT
TRANSDUCER W/STOPCOCK (MISCELLANEOUS) ×1 IMPLANT
TUBING CIL FLEX 10 FLL-RA (TUBING) ×1 IMPLANT
WIRE ASAHI PROWATER 180CM (WIRE) IMPLANT
WIRE EMERALD 3MM-J .035X150CM (WIRE) IMPLANT
WIRE EMERALD 3MM-J .035X260CM (WIRE) IMPLANT
WIRE MICRO SET SILHO 5FR 7 (SHEATH) IMPLANT
WIRE MICROINTRODUCER 60CM (WIRE) IMPLANT

## 2022-05-08 NOTE — Progress Notes (Signed)
ACT draw and resulted 206 at 1209.  Right femoral site remains unremarkable. Level 0. No oozing,no hematoma.

## 2022-05-08 NOTE — Progress Notes (Signed)
[facility] PROGRESS NOTE  Karen [name] (DOB: 27-Nov-1945) ASN:[id number] [facility]CP: [facility]  Brief Narrative: Karen [name] is a 77 y.o. 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 Hemoglobin down from normal 12.9 to 10g/dl and subsequently 8.8 with active bleeding identified on CT angiogram. Underwent embolization of the distal left colic/sigmoid branch artery with resolution of bleeding. Hemoglobin has stabilized. Developed chest pain and ECG changes consistent with STEMI on the morning of 1/10. To cath lab.   Subjective: This morning, felt well, wants to go home today. Eating fine. Doesn't like pork chops so didn't eat that last night.   Later this morning developed severe abrupt onset chest pain.   Objective: BP (!) 119/57   Pulse 77   Temp 97.8 F (36.6 C) (Oral)   Resp 14   Ht 5\' 3"  (1.6 m)   Wt 70 kg   SpO2 99%   BMI 27.34 kg/m   No distress Clear, nonlabored RRR, no edema Soft, tender in LLQ but no rebound, guarding, or rigidity or distention. +BS.  REEVALUATION:  Mild distress, clear, nonlabored, RRR, tele with new bradycardia that has resolved at a few short (< 5 beat) runs of NSVT. No edema. Abd stable.  Assessment & Plan: Principal Problem:   Lower GI bleed Active Problems:   Adenocarcinoma, lung (HCC)   Tobacco use   Essential hypertension   Coronary artery disease involving native heart with angina pectoris (HCC)   VENTRICULAR FIBRILLATION   Stage 2 moderate COPD by GOLD classification (HCC)   Chest pain   Prolonged QT interval  CAD, hx CABG: Troponin was normal even in setting of acute transfusion-dependent anemia from GI bleed.  - On 1/10, the patient developed severe substernal chest pain associated with dyspnea, abrupt bradycardia with occasional VT on telemetry and ECG consistent with ACS/STEMI. Discussed  with Dr. Burt Knack, code STEMI called.  - NTG given, oxygen applied, repeat ECG much better/normalized.  - After discussion with the patient, plavix load ordered and plan for cath lab. D/w Drs. Burt Knack and [employer] who will call patient's daughter.  Acute blood loss anemia due to diverticular GI bleed: s/p distal left colic/sigmoid artery branch vessel embolization by Dr. Maryelizabeth Kaufmann - s/p 1u PRBC 1/7. Hgb baseline seems to be normal, last value 12.9g/dl. Has rebounded appropriately and stabilized at 10.6g/dl with no ongoing bleeding clinically. - Antiplatelet plan was to restart 1/10, see discussion above.   Thrombocytopenia: Suspected to be new, but is stabilized >100k.  - Will monitor CBC   History of CHF with recovered EF, HTN: LVEF 50-55%.  - BP normalized and patient's home medications were restarted.   Hx VF arrest s/p AICD. Keep K and Mg replete.    Hypokalemia - Supplemented   COPD: Quiescent.  - Continue home meds   History of left lung CA s/p SBRT 2022.    Stage IIIa/b CKD: Based on baseline CrCl 45.49ml/min.  - Suggest recheck BMP at follow up   HLD:  - Statin   Vitamin B12 deficiency: Marginal B12 level and macrocytosis noted.  - Supplemented IM while admitted, continue po supplement   Tobacco use:  - Cessation counseling provided - Nicotine patch while admitted   GERD, chronic dysphagia:  - Continue with plans for EGD with [facility].   Patrecia Pour, MD [facility] [email or web address] 05/08/2022, 8:14 AM

## 2022-05-08 NOTE — Discharge Summary (Signed)
Physician Discharge Summary   Patient: Karen [name] MRN: [id number] DOB: 06-19-1945  Admit date:     05/05/2022  Discharge date: 05/08/22  Discharge Physician: Patrecia Pour   PCP: [facility]   Recommendations at discharge:  Follow up with GI in the next week to reschedule planned outpatient EGD and colonoscopy. Due to reflux symptoms, augmenting to PPI BID and carafate.  Suggest recheck CBC and BMP and vitamin B12 levels per PCP. Tobacco cessation counseling to be continued.  Discharge Diagnoses: Principal Problem:   Lower GI bleed Active Problems:   Adenocarcinoma, lung (HCC)   Tobacco use   Essential hypertension   Coronary artery disease involving native heart with angina pectoris (HCC)   VENTRICULAR FIBRILLATION   Stage 2 moderate COPD by GOLD classification (HCC)   Chest pain   Prolonged QT interval  Hospital Course: Karen [name] is a 77 y.o. 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 Hemoglobin down from normal 12.9 to 10g/dl and subsequently 8.8 with active bleeding identified on CT angiogram. Underwent embolization of the distal left colic/sigmoid branch artery with resolution of bleeding. Hemoglobin has stabilized. She has mild abdominal pain requiring no pain medications and not interfering with taking oral hydration and nutrition. She requests discharge on 1/10 feeling well.   Assessment and Plan: Acute blood loss anemia due to diverticular GI bleed:  - s/p 1u PRBC 1/7. Hgb baseline seems to be normal, last value 12.9g/dl. Has rebounded appropriately and stayed stable, 10.6g/dl at discharge. - s/p distal left colic/sigmoid artery branch vessel embolization by Dr. Maryelizabeth Kaufmann.  - Restart plavix 1/10 per GI. There is no evidence of ongoing bleeding for several days since procedure.   Thrombocytopenia: Suspected to be new, but is  stabilized >100k.  - Suggest recheck full CBC at follow up.    CAD, hx CABG: Troponin is normal. Remarkably low given the GI bleed, anemia.  - Restarting home medications including plavix per GI/IR recommendations.   History of CHF with recovered EF, HTN: LVEF 50-55%.  - BP normalized and patient's home medications were restarted.   Hx VF arrest s/p AICD. Keep K and Mg replete.    Hypokalemia - Supplemented   COPD: Quiescent.  - Continue home meds   History of left lung CA s/p SBRT 2022.    Stage IIIa/b CKD: Based on baseline CrCl 45.69ml/min.  - Suggest recheck BMP at follow up   HLD:  - Statin   Vitamin B12 deficiency: Marginal B12 level and macrocytosis noted.  - Supplemented IM while admitted, continue po supplement   Tobacco use:  - Cessation counseling provided - Nicotine patch while admitted   GERD, chronic dysphagia:  - Continue with plans for EGD with [facility].   Consultants: GI, IR Procedures performed: distal left colic/sigmoid artery branch vessel embolization by Dr. Maryelizabeth Kaufmann  Disposition: Home Diet recommendation:  Cardiac diet DISCHARGE MEDICATION: Allergies as of 05/08/2022       Reactions   Codeine Itching, Swelling   Novocain [procaine] Hives, Swelling   Eyes swelling        Medication List     STOP taking these medications    famotidine 40 MG tablet Commonly known as: PEPCID       TAKE these medications    acetaminophen 500 MG tablet Commonly known as: TYLENOL Take 500 mg by mouth  every 6 (six) hours as needed for headache.   albuterol 108 (90 Base) MCG/ACT inhaler Commonly known as: VENTOLIN HFA Inhale 2 puffs into the lungs every 6 (six) hours as needed for wheezing or shortness of breath.   clopidogrel 75 MG tablet Commonly known as: PLAVIX TAKE 1 TABLET BY MOUTH DAILY   cyanocobalamin 1000 MCG tablet Commonly known as: VITAMIN B12 Take 1 tablet (1,000 mcg total) by mouth daily.   cyclobenzaprine 10 MG tablet Commonly  known as: FLEXERIL Take 10 mg by mouth 3 (three) times daily as needed for muscle spasms.   fluticasone 50 MCG/ACT nasal spray Commonly known as: FLONASE Place 1 spray into both nostrils daily. What changed:  when to take this reasons to take this   furosemide 20 MG tablet Commonly known as: LASIX TAKE 1 TABLET BY MOUTH DAILY   guaiFENesin 600 MG 12 hr tablet Commonly known as: Mucinex Take 1 tablet (600 mg total) by mouth 2 (two) times daily. What changed:  when to take this reasons to take this   isosorbide mononitrate 60 MG 24 hr tablet Commonly known as: IMDUR Take 1 tablet (60 mg total) by mouth 2 (two) times daily.   lisinopril 10 MG tablet Commonly known as: ZESTRIL TAKE 1 TABLET BY MOUTH DAILY   loratadine 10 MG tablet Commonly known as: CLARITIN Take 1 tablet (10 mg total) by mouth daily. What changed:  when to take this reasons to take this   naloxone 4 MG/0.1ML Liqd nasal spray kit Commonly known as: NARCAN SMARTSIG:Both Nares   nitroGLYCERIN 0.4 MG SL tablet Commonly known as: NITROSTAT Place 1 tablet (0.4 mg total) under the tongue every 5 (five) minutes as needed for chest pain (X3 DOSES MAX).   oxyCODONE-acetaminophen 7.5-325 MG tablet Commonly known as: PERCOCET Take 1-2 tablets by mouth every 4 (four) hours as needed for moderate pain (back pain).   pantoprazole 40 MG tablet Commonly known as: PROTONIX Take 1 tablet (40 mg total) by mouth 2 (two) times daily.   Stiolto Respimat 2.5-2.5 MCG/ACT Aers Generic drug: Tiotropium Bromide-Olodaterol Inhale 2 puffs into the lungs daily.   sucralfate 1 GM/10ML suspension Commonly known as: CARAFATE Take 10 mLs (1 g total) by mouth 2 (two) times daily for 14 days.        [facility] Follow up.   Contact information: [place] [address] [phone]                Discharge Exam: Karen [name] Weights   05/06/22 2055  Weight:  70 kg  No distress, feels well, wants to go home today. Eating fine. Doesn't like pork chops so didn't eat that last night.  Clear, nonlabored RRR, no edema Soft, tender in LLQ but no rebound, guarding, or rigidity or distention. +BS.  Condition at discharge: stable  The results of significant diagnostics from this hospitalization (including imaging, microbiology, ancillary and laboratory) are listed below for reference.   Imaging Studies: IR EMBO ART  VEN HEMORR LYMPH EXTRAV  INC GUIDE ROADMAPPING  Result Date: 05/06/2022 INDICATION: Hematochezia.  Hypotension. EXAM: Procedures: 1. ABDOMINAL AORTOGRAPHY 2. INFERIOR MESENTERIC ARTERY ARTERIOGRAPHY 3. COIL EMBOLIZATION OF ACTIVE EXTRAVASATION AT DISTAL DESCENDING COLON / PROXIMAL SIGMOID COLON COMPARISON:  CT AP, earlier same day. MEDICATIONS: Benadryl 25 mg IV.  Ancef 2 g IV. ANESTHESIA/SEDATION: Moderate (conscious) sedation was employed during this procedure. A total of Versed 2 mg and Fentanyl 100 mcg was administered intravenously. Moderate  Sedation Time: 85 minutes. The patient's level of consciousness and vital signs were monitored continuously by radiology nursing throughout the procedure under my direct supervision. CONTRAST:  65 mL Omnipaque 300 FLUOROSCOPY TIME:  Fluoroscopic dose; 993 mGy COMPLICATIONS: None immediate. PROCEDURE: Informed consent was obtained from the patient and/or patient's representative following explanation of the procedure, risks, benefits and alternatives. All questions were addressed. A time out was performed prior to the initiation of the procedure. Maximal barrier sterile technique utilized including caps, mask, sterile gowns, sterile gloves, large sterile drape, hand hygiene, and Betadine prep. The RIGHT femoral head was marked fluoroscopically. Under sterile conditions and local anesthesia, the RIGHT common femoral artery access was performed with a micropuncture needle. Under direct ultrasound guidance, the RIGHT  common femoral was accessed with a micropuncture kit. An ultrasound image was saved for documentation purposes. This allowed for placement of a 5 Fr vascular sheath. A limited arteriogram was performed through the side arm of the sheath confirming appropriate access within the RIGHT common femoral artery. The short sheath was then exchanged for a 5 Fr, 35 cm Brite tip for additional stability. A limited abdominal aortogram was performed to help identify the IMA origin. Over a Bentson wire, a Sos catheter was advanced, reformed, back bled and flushed. The catheter was then utilized to select the inferior mesenteric artery and a selective inferior mesenteric arteriogram was performed. Using a 2.5 Fr lantern microcatheter and microwire, access into the distal LEFT colic/sigmoid artery branch vessels was performed and selective arteriography was obtained to identify the bleeding vessel. Super selective coil embolization was then performed using multiple non detachable micro coils. A post embolization arteriogram was performed without evidence of residual extravasation. Images were reviewed and the procedure was terminated. All wires, catheters and sheaths were removed from the patient. Hemostasis was achieved at the RIGHT groin access site with Angio-Seal closure device. The patient tolerated the procedure well without immediate post procedural complication. FINDINGS: - access via the RIGHT femoral artery. - Focus of active extravasation from IMA territory, distal LEFT colic / sigmoid artery branch vessel - Successful embolization without residual extravasation - Angio-Seal closure at RIGHT groin. Palpable RLE pulses at the end of the Case IMPRESSION: Successful super-selective coil embolization of focus of active extravasation from IMA territory, distal LEFT colic/sigmoid artery branch vessel, as above. PLAN: - The patient is to remain flat for 2 hours with RIGHT leg straight. - The patient may continue to experience  several additional bloody bowel movements however should stabilize in the coming days. Michaelle Birks, MD [facility] Radiology Electronically Signed   By: Michaelle Birks M.D.   On: 05/06/2022 00:41   IR US Guide Vasc Access Right  Result Date: 05/06/2022 INDICATION: Hematochezia.  Hypotension. EXAM: Procedures: 1. ABDOMINAL AORTOGRAPHY 2. INFERIOR MESENTERIC ARTERY ARTERIOGRAPHY 3. COIL EMBOLIZATION OF ACTIVE EXTRAVASATION AT DISTAL DESCENDING COLON / PROXIMAL SIGMOID COLON COMPARISON:  CT AP, earlier same day. MEDICATIONS: Benadryl 25 mg IV.  Ancef 2 g IV. ANESTHESIA/SEDATION: Moderate (conscious) sedation was employed during this procedure. A total of Versed 2 mg and Fentanyl 100 mcg was administered intravenously. Moderate Sedation Time: 85 minutes. The patient's level of consciousness and vital signs were monitored continuously by radiology nursing throughout the procedure under my direct supervision. CONTRAST:  65 mL Omnipaque 300 FLUOROSCOPY TIME:  Fluoroscopic dose; 570 mGy COMPLICATIONS: None immediate. PROCEDURE: Informed consent was obtained from the patient and/or patient's representative following explanation of the procedure, risks, benefits and alternatives. All  questions were addressed. A time out was performed prior to the initiation of the procedure. Maximal barrier sterile technique utilized including caps, mask, sterile gowns, sterile gloves, large sterile drape, hand hygiene, and Betadine prep. The RIGHT femoral head was marked fluoroscopically. Under sterile conditions and local anesthesia, the RIGHT common femoral artery access was performed with a micropuncture needle. Under direct ultrasound guidance, the RIGHT common femoral was accessed with a micropuncture kit. An ultrasound image was saved for documentation purposes. This allowed for placement of a 5 Fr vascular sheath. A limited arteriogram was performed through the side arm of the sheath  confirming appropriate access within the RIGHT common femoral artery. The short sheath was then exchanged for a 5 Fr, 35 cm Brite tip for additional stability. A limited abdominal aortogram was performed to help identify the IMA origin. Over a Bentson wire, a Sos catheter was advanced, reformed, back bled and flushed. The catheter was then utilized to select the inferior mesenteric artery and a selective inferior mesenteric arteriogram was performed. Using a 2.5 Fr lantern microcatheter and microwire, access into the distal LEFT colic/sigmoid artery branch vessels was performed and selective arteriography was obtained to identify the bleeding vessel. Super selective coil embolization was then performed using multiple non detachable micro coils. A post embolization arteriogram was performed without evidence of residual extravasation. Images were reviewed and the procedure was terminated. All wires, catheters and sheaths were removed from the patient. Hemostasis was achieved at the RIGHT groin access site with Angio-Seal closure device. The patient tolerated the procedure well without immediate post procedural complication. FINDINGS: - access via the RIGHT femoral artery. - Focus of active extravasation from IMA territory, distal LEFT colic / sigmoid artery branch vessel - Successful embolization without residual extravasation - Angio-Seal closure at RIGHT groin. Palpable RLE pulses at the end of the Case IMPRESSION: Successful super-selective coil embolization of focus of active extravasation from IMA territory, distal LEFT colic/sigmoid artery branch vessel, as above. PLAN: - The patient is to remain flat for 2 hours with RIGHT leg straight. - The patient may continue to experience several additional bloody bowel movements however should stabilize in the coming days. Michaelle Birks, MD [facility] Radiology Electronically Signed   By: Michaelle Birks M.D.   On: 05/06/2022  00:41   IR Angiogram Visceral Selective  Result Date: 05/06/2022 INDICATION: Hematochezia.  Hypotension. EXAM: Procedures: 1. ABDOMINAL AORTOGRAPHY 2. INFERIOR MESENTERIC ARTERY ARTERIOGRAPHY 3. COIL EMBOLIZATION OF ACTIVE EXTRAVASATION AT DISTAL DESCENDING COLON / PROXIMAL SIGMOID COLON COMPARISON:  CT AP, earlier same day. MEDICATIONS: Benadryl 25 mg IV.  Ancef 2 g IV. ANESTHESIA/SEDATION: Moderate (conscious) sedation was employed during this procedure. A total of Versed 2 mg and Fentanyl 100 mcg was administered intravenously. Moderate Sedation Time: 85 minutes. The patient's level of consciousness and vital signs were monitored continuously by radiology nursing throughout the procedure under my direct supervision. CONTRAST:  65 mL Omnipaque 300 FLUOROSCOPY TIME:  Fluoroscopic dose; 379 mGy COMPLICATIONS: None immediate. PROCEDURE: Informed consent was obtained from the patient and/or patient's representative following explanation of the procedure, risks, benefits and alternatives. All questions were addressed. A time out was performed prior to the initiation of the procedure. Maximal barrier sterile technique utilized including caps, mask, sterile gowns, sterile gloves, large sterile drape, hand hygiene, and Betadine prep. The RIGHT femoral head was marked fluoroscopically. Under sterile conditions and local anesthesia, the RIGHT common femoral artery access was performed with a micropuncture needle. Under direct ultrasound guidance,  the RIGHT common femoral was accessed with a micropuncture kit. An ultrasound image was saved for documentation purposes. This allowed for placement of a 5 Fr vascular sheath. A limited arteriogram was performed through the side arm of the sheath confirming appropriate access within the RIGHT common femoral artery. The short sheath was then exchanged for a 5 Fr, 35 cm Brite tip for additional stability. A limited abdominal aortogram was performed to help identify the IMA origin.  Over a Bentson wire, a Sos catheter was advanced, reformed, back bled and flushed. The catheter was then utilized to select the inferior mesenteric artery and a selective inferior mesenteric arteriogram was performed. Using a 2.5 Fr lantern microcatheter and microwire, access into the distal LEFT colic/sigmoid artery branch vessels was performed and selective arteriography was obtained to identify the bleeding vessel. Super selective coil embolization was then performed using multiple non detachable micro coils. A post embolization arteriogram was performed without evidence of residual extravasation. Images were reviewed and the procedure was terminated. All wires, catheters and sheaths were removed from the patient. Hemostasis was achieved at the RIGHT groin access site with Angio-Seal closure device. The patient tolerated the procedure well without immediate post procedural complication. FINDINGS: - access via the RIGHT femoral artery. - Focus of active extravasation from IMA territory, distal LEFT colic / sigmoid artery branch vessel - Successful embolization without residual extravasation - Angio-Seal closure at RIGHT groin. Palpable RLE pulses at the end of the Case IMPRESSION: Successful super-selective coil embolization of focus of active extravasation from IMA territory, distal LEFT colic/sigmoid artery branch vessel, as above. PLAN: - The patient is to remain flat for 2 hours with RIGHT leg straight. - The patient may continue to experience several additional bloody bowel movements however should stabilize in the coming days. Michaelle Birks, MD [facility] Radiology Electronically Signed   By: Michaelle Birks M.D.   On: 05/06/2022 00:41   IR Aortagram Abdominal Serialogram  Result Date: 05/06/2022 INDICATION: Hematochezia.  Hypotension. EXAM: Procedures: 1. ABDOMINAL AORTOGRAPHY 2. INFERIOR MESENTERIC ARTERY ARTERIOGRAPHY 3. COIL EMBOLIZATION OF ACTIVE  EXTRAVASATION AT DISTAL DESCENDING COLON / PROXIMAL SIGMOID COLON COMPARISON:  CT AP, earlier same day. MEDICATIONS: Benadryl 25 mg IV.  Ancef 2 g IV. ANESTHESIA/SEDATION: Moderate (conscious) sedation was employed during this procedure. A total of Versed 2 mg and Fentanyl 100 mcg was administered intravenously. Moderate Sedation Time: 85 minutes. The patient's level of consciousness and vital signs were monitored continuously by radiology nursing throughout the procedure under my direct supervision. CONTRAST:  65 mL Omnipaque 300 FLUOROSCOPY TIME:  Fluoroscopic dose; 660 mGy COMPLICATIONS: None immediate. PROCEDURE: Informed consent was obtained from the patient and/or patient's representative following explanation of the procedure, risks, benefits and alternatives. All questions were addressed. A time out was performed prior to the initiation of the procedure. Maximal barrier sterile technique utilized including caps, mask, sterile gowns, sterile gloves, large sterile drape, hand hygiene, and Betadine prep. The RIGHT femoral head was marked fluoroscopically. Under sterile conditions and local anesthesia, the RIGHT common femoral artery access was performed with a micropuncture needle. Under direct ultrasound guidance, the RIGHT common femoral was accessed with a micropuncture kit. An ultrasound image was saved for documentation purposes. This allowed for placement of a 5 Fr vascular sheath. A limited arteriogram was performed through the side arm of the sheath confirming appropriate access within the RIGHT common femoral artery. The short sheath was then exchanged for a 5 Fr, 35 cm Brite tip for  additional stability. A limited abdominal aortogram was performed to help identify the IMA origin. Over a Bentson wire, a Sos catheter was advanced, reformed, back bled and flushed. The catheter was then utilized to select the inferior mesenteric artery and a selective inferior mesenteric arteriogram was performed. Using a  2.5 Fr lantern microcatheter and microwire, access into the distal LEFT colic/sigmoid artery branch vessels was performed and selective arteriography was obtained to identify the bleeding vessel. Super selective coil embolization was then performed using multiple non detachable micro coils. A post embolization arteriogram was performed without evidence of residual extravasation. Images were reviewed and the procedure was terminated. All wires, catheters and sheaths were removed from the patient. Hemostasis was achieved at the RIGHT groin access site with Angio-Seal closure device. The patient tolerated the procedure well without immediate post procedural complication. FINDINGS: - access via the RIGHT femoral artery. - Focus of active extravasation from IMA territory, distal LEFT colic / sigmoid artery branch vessel - Successful embolization without residual extravasation - Angio-Seal closure at RIGHT groin. Palpable RLE pulses at the end of the Case IMPRESSION: Successful super-selective coil embolization of focus of active extravasation from IMA territory, distal LEFT colic/sigmoid artery branch vessel, as above. PLAN: - The patient is to remain flat for 2 hours with RIGHT leg straight. - The patient may continue to experience several additional bloody bowel movements however should stabilize in the coming days. Michaelle Birks, MD [facility] Radiology Electronically Signed   By: Michaelle Birks M.D.   On: 05/06/2022 00:41   CT ANGIO GI BLEED  Result Date: 05/05/2022 CLINICAL DATA:  Lower GI bleeding. EXAM: CTA ABDOMEN AND PELVIS WITHOUT AND WITH CONTRAST TECHNIQUE: Multidetector CT imaging of the abdomen and pelvis was performed using the standard protocol during bolus administration of intravenous contrast. Multiplanar reconstructed images and MIPs were obtained and reviewed to evaluate the vascular anatomy. RADIATION DOSE REDUCTION: This exam was performed according  to the departmental dose-optimization program which includes automated exposure control, adjustment of the mA and/or kV according to patient size and/or use of iterative reconstruction technique. CONTRAST:  149mL OMNIPAQUE IOHEXOL 350 MG/ML SOLN COMPARISON:  PET-CT 02/22/2022. FINDINGS: VASCULAR Aorta: Normal caliber aorta without aneurysm, dissection, vasculitis or significant stenosis. There are atherosclerotic calcifications throughout the aorta. Celiac: Patent without evidence of aneurysm, dissection, vasculitis or significant stenosis. SMA: Patent without evidence of aneurysm, dissection, vasculitis or significant stenosis. Renals: Both renal arteries are patent without evidence of aneurysm, dissection, vasculitis, fibromuscular dysplasia or significant stenosis. IMA: Patent without evidence of aneurysm, dissection, vasculitis or significant stenosis. Inflow: Patent without evidence of aneurysm, dissection, vasculitis or significant stenosis. Atherosclerotic calcifications are present. Proximal Outflow: Bilateral common femoral and visualized portions of the superficial and profunda femoral arteries are patent without evidence of aneurysm, dissection, vasculitis or significant stenosis. Veins: Within normal limits. Review of the MIP images confirms the above findings. NON-VASCULAR Lower chest: Scarring in the right lung base is unchanged. Hepatobiliary: There are 3 subcentimeter rounded hypodensities in the right lobe of the liver. The larger are unchanged from prior in the smaller are too small to characterize. These are favored as cysts or hemangiomas. Gallbladder surgically absent. There is no biliary ductal dilatation. Pancreas: Unremarkable. No pancreatic ductal dilatation or surrounding inflammatory changes. Spleen: Normal in size without focal abnormality. Adrenals/Urinary Tract: There are subcentimeter cortical cysts in each kidney. Otherwise, the kidneys, adrenal glands and bladder are within normal  limits. Stomach/Bowel: There is active arterial extravasation of contrast/bleeding  within the descending/sigmoid colon junction. There is no focal wall thickening or inflammation in this region. There is no bowel obstruction or free air. There is diffuse colonic diverticulosis. The appendix, stomach and small bowel loops appear normal. Lymphatic: No enlarged lymph nodes are identified. Reproductive: Status post hysterectomy. No adnexal masses. Other: No abdominal wall hernia or abnormality. No abdominopelvic ascites. Musculoskeletal: Degenerative changes affect the spine. IMPRESSION: 1. Active bleeding within the descending/sigmoid colon junction. No focal wall thickening or inflammation. Aortic Atherosclerosis (ICD10-I70.0). NON-VASCULAR 1. No other acute localizing process in the abdomen or pelvis. 2. Subcentimeter Bosniak II benign renal cyst. No follow-up imaging is recommended. JACR 2018 Feb; [id number], Management of the Incidental Renal Mass on CT, [employer] 2021; [id number], Bosniak Classification of Cystic Renal Masses, Version 2019. Electronically Signed   By: Ronney Asters M.D.   On: 05/05/2022 18:08   DG Chest Port 1 View  Result Date: 05/05/2022 CLINICAL DATA:  Weakness. EXAM: PORTABLE CHEST 1 VIEW COMPARISON:  Chest radiograph dated 12/09/2021 and CT chest dated 02/04/2022. FINDINGS: The heart size and mediastinal contours are within normal limits. Vascular calcifications are seen in the aortic arch. The patient's known bilateral pulmonary nodules are better seen on prior CT. No new focal consolidation, pleural effusion, or pneumothorax is identified. The visualized skeletal structures are unremarkable. A left subclavian approach cardiac device and median sternotomy wires are redemonstrated. IMPRESSION: No acute pulmonary process. The patient's known bilateral pulmonary nodules are better seen on prior CT. Electronically Signed   By: Zerita Boers M.D.   On: 05/05/2022 14:36     Microbiology: Results for orders placed or performed during the hospital encounter of 05/05/22  Resp panel by RT-PCR (RSV, Flu A&B, Covid) Anterior Nasal Swab     Status: None   Collection Time: 05/05/22  1:54 PM   Specimen: Anterior Nasal Swab  Result Value Ref Range Status   SARS Coronavirus 2 by RT PCR NEGATIVE NEGATIVE Final    Comment: (NOTE) SARS-CoV-2 target nucleic acids are NOT DETECTED.  The SARS-CoV-2 RNA is generally detectable in upper respiratory specimens during the acute phase of infection. The lowest concentration of SARS-CoV-2 viral copies this assay can detect is 138 copies/mL. A negative result does not preclude SARS-Cov-2 infection and should not be used as the sole basis for treatment or other patient management decisions. A negative result may occur with  improper specimen collection/handling, submission of specimen other than nasopharyngeal swab, presence of viral mutation(s) within the areas targeted by this assay, and inadequate number of viral copies(<138 copies/mL). A negative result must be combined with clinical observations, patient history, and epidemiological information. The expected result is Negative.  Fact Sheet for Patients:  EntrepreneurPulse.com.au  Fact Sheet for Healthcare Providers:  IncredibleEmployment.be  This test is no t yet approved or cleared by the [place] FDA and  has been authorized for detection and/or diagnosis of SARS-CoV-2 by FDA under an Emergency Use Authorization (EUA). This EUA will remain  in effect (meaning this test can be used) for the duration of the COVID-19 declaration under Section 564(b)(1) of the Act, 21 U.S.C.section 360bbb-3(b)(1), unless the authorization is terminated  or revoked sooner.       Influenza A by PCR NEGATIVE NEGATIVE Final   Influenza B by PCR NEGATIVE NEGATIVE Final    Comment: (NOTE) The Xpert Xpress SARS-CoV-2/FLU/RSV plus assay is intended  as an aid in the diagnosis of influenza from Nasopharyngeal swab specimens and should not be used as a sole basis for  treatment. Nasal washings and aspirates are unacceptable for Xpert Xpress SARS-CoV-2/FLU/RSV testing.  Fact Sheet for Patients: EntrepreneurPulse.com.au  Fact Sheet for Healthcare Providers: IncredibleEmployment.be  This test is not yet approved or cleared by the [place] FDA and has been authorized for detection and/or diagnosis of SARS-CoV-2 by FDA under an Emergency Use Authorization (EUA). This EUA will remain in effect (meaning this test can be used) for the duration of the COVID-19 declaration under Section 564(b)(1) of the Act, 21 U.S.C. section 360bbb-3(b)(1), unless the authorization is terminated or revoked.     Resp Syncytial Virus by PCR NEGATIVE NEGATIVE Final    Comment: (NOTE) Fact Sheet for Patients: EntrepreneurPulse.com.au  Fact Sheet for Healthcare Providers: IncredibleEmployment.be  This test is not yet approved or cleared by the [place] FDA and has been authorized for detection and/or diagnosis of SARS-CoV-2 by FDA under an Emergency Use Authorization (EUA). This EUA will remain in effect (meaning this test can be used) for the duration of the COVID-19 declaration under Section 564(b)(1) of the Act, 21 U.S.C. section 360bbb-3(b)(1), unless the authorization is terminated or revoked.  Performed at [facility], [place] [address] [place], [place] [address]   MRSA Next Gen by PCR, Nasal     Status: None   Collection Time: 05/06/22  9:00 PM   Specimen: Nasal Mucosa; Nasal Swab  Result Value Ref Range Status   MRSA by PCR Next Gen NOT DETECTED NOT DETECTED Final    Comment: (NOTE) The GeneXpert MRSA Assay (FDA approved for NASAL specimens only), is one component of a comprehensive MRSA colonization surveillance program. It is not intended to diagnose  MRSA infection nor to guide or monitor treatment for MRSA infections. Test performance is not FDA approved in patients less than 33 years old. Performed at [facility], [place] [address] [place], [place] [address]     Labs: CBC: Recent Labs  Lab 05/05/22 1354 05/05/22 2201 05/06/22 1151 05/06/22 1500 05/06/22 2138 05/07/22 0048 05/08/22 0552  WBC 4.8   < > 7.2 6.9 7.6 5.9 4.9  NEUTROABS 2.7  --   --   --   --   --   --   HGB 10.0*   < > 10.1* 10.2* 10.7* 10.0* 10.6*  HCT 32.0*   < > 31.2* 30.6* 31.5* 30.7* 32.0*  MCV 100.9*   < > 97.5 96.2 94.3 98.1 96.1  PLT 146*   < > 132* 108* 129* 124* 119*   < > = values in this interval not displayed.   Basic Metabolic Panel: Recent Labs  Lab 05/05/22 1354 05/06/22 0317 05/07/22 0048  NA 140 136 137  K 3.8 4.0 3.2*  CL 106 111 107  CO2 26 18* 21*  GLUCOSE 111* 103* 100*  BUN 12 17 11   CREATININE 1.20* 1.12* 0.98  CALCIUM 8.6* 8.2* 8.2*  MG  --  1.7 2.1  PHOS  --  3.4  --    Liver Function Tests: Recent Labs  Lab 05/05/22 1354 05/06/22 0317  AST 16 14*  ALT 13 10  ALKPHOS 55 52  BILITOT 0.6 0.8  PROT 5.3* 4.8*  ALBUMIN 3.2* 3.1*   Discharge time spent: greater than 30 minutes.  Signed: Patrecia Pour, MD [facility] 05/08/2022

## 2022-05-08 NOTE — Progress Notes (Signed)
Site area: Right groin a 6 french arterial sheath was removed  Site Prior to Removal:  Level 0  Pressure Applied For 20 MINUTES    Bedrest Beginning at 1350 p  x4 hours  Manual:   Yes.    Patient Status During Pull:  stable  Post Pull Groin Site:  Level 0  Post Pull Instructions Given:  Yes.    Post Pull Pulses Present:  Yes.    Dressing Applied:  Yes.    Comments:

## 2022-05-08 NOTE — Consult Note (Addendum)
Cardiology Consultation   Patient ID: Karen [name] MRN: [id number]; DOB: June 13, 1945  Admit date: 05/05/2022 Date of Consult: 05/08/2022  PCP:  [facility] Cardiologist:  Karen Grooms, MD  Electrophysiologist:  Virl Axe, MD       Patient Profile:   Karen [name] is a 77 y.o. female with a hx of CAD and prior MI who is being seen 05/08/2022 for the evaluation of STEMI at the request of Dr Bonner Puna.  History of Present Illness:   Karen [name] has an extensive cardiac history with multiple MIs, ICD placement in the remote past, and multivessel CABG in 2008.  She is admitted 05/05/2022 with painless lower GI bleeding and found to have acute bleeding from a left colic/sigmoid artery branch treated with coil embolization by interventional radiology.  The patient required 1 unit of packed red blood cells.  She has had no further bleeding.  This morning, she was felt to be medically stable for hospital discharge.  However, she developed acute and severe substernal chest pain.  An EKG was performed and demonstrated an inferolateral STEMI pattern.  She also had bradycardia and nonsustained ventricular tachycardia.  I was called for stat consult and reviewed the EKG.  We elected to page out a code STEMI.  The patient is evaluated emergently and her chest pain has resolved.  She had been treated with nitroglycerin and was given 75 mg of clopidogrel.  Her clopidogrel was held 3 days ago appropriately when she presented with her GI bleeding.  The patient has had intermittent angina and takes sublingual nitroglycerin as needed.  She is followed regularly by Dr. Irish [name].  She currently is chest pain-free.  She does have shortness of breath with activity.  She has been a longstanding smoker.  She denies heart palpitations, edema, orthopnea, or PND.  I have discussed her case with her attending physician, Dr. Bonner Puna, and we agree that proceeding emergently with  cardiac catheterization and emergency PCI is indicated to prevent further myocardial infarction with her high risk findings of marked ST segment elevation and ventricular tachycardia, even in the setting of her lower GI bleed.   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     Past Surgical History:  Procedure Laterality Date   CARDIAC DEFIBRILLATOR PLACEMENT  03/2006   [employer]. remote-yes.    CORONARY ARTERY BYPASS GRAFT     x4   IMPLANTABLE CARDIOVERTER DEFIBRILLATOR GENERATOR CHANGE N/A 03/03/2013   Procedure: IMPLANTABLE CARDIOVERTER DEFIBRILLATOR GENERATOR CHANGE;  Surgeon: Deboraha Sprang, MD;  Location: [facility] CATH LAB;  Service: Cardiovascular;  Laterality: N/A;   IR ANGIOGRAM VISCERAL SELECTIVE  05/06/2022   IR AORTAGRAM ABDOMINAL SERIALOGRAM  05/06/2022   IR EMBO ART  VEN HEMORR LYMPH EXTRAV  INC GUIDE ROADMAPPING  05/06/2022   IR US GUIDE VASC ACCESS RIGHT  05/06/2022   stent implant     x1. possibly x2.  Home Medications:  Prior to Admission medications   Medication Sig Start Date End Date Taking? Authorizing Provider  acetaminophen (TYLENOL) 500 MG tablet Take 500 mg by mouth every 6 (six) hours as needed for headache.   Yes [provider]  albuterol (VENTOLIN HFA) 108 (90 Base) MCG/ACT inhaler Inhale 2 puffs into the lungs every 6 (six) hours as needed for wheezing or shortness of breath. 05/21/21  Yes Cobb, Karie Schwalbe, NP  clopidogrel (PLAVIX) 75 MG  tablet TAKE 1 TABLET BY MOUTH DAILY Patient taking differently: Take 75 mg by mouth daily. 12/06/21  Yes Jettie Booze, MD  cyanocobalamin (VITAMIN B12) 1000 MCG tablet Take 1 tablet (1,000 mcg total) by mouth daily. 05/08/22  Yes Patrecia Pour, MD  cyclobenzaprine (FLEXERIL) 10 MG tablet Take 10 mg by mouth 3 (three) times daily as needed for muscle spasms. 01/16/21  Yes [provider]  famotidine (PEPCID) 40 MG tablet Take 1 tablet (40 mg total) by mouth at bedtime. 03/28/22  Yes Collene Gobble, MD  fluticasone (FLONASE) 50 MCG/ACT nasal spray Place 1 spray into both nostrils daily. Patient taking differently: Place 1 spray into both nostrils daily as needed for allergies. 04/06/21  Yes Cobb, Karie Schwalbe, NP  furosemide (LASIX) 20 MG tablet TAKE 1 TABLET BY MOUTH DAILY Patient taking differently: Take 20 mg by mouth daily. 03/05/22  Yes Jettie Booze, MD  guaiFENesin (MUCINEX) 600 MG 12 hr tablet Take 1 tablet (600 mg total) by mouth 2 (two) times daily. Patient taking differently: Take 600 mg by mouth daily as needed for cough. 04/06/21  Yes Cobb, Karie Schwalbe, NP  isosorbide mononitrate (IMDUR) 60 MG 24 hr tablet Take 1 tablet (60 mg total) by mouth 2 (two) times daily. 10/29/21  Yes Jettie Booze, MD  lisinopril (ZESTRIL) 10 MG tablet TAKE 1 TABLET BY MOUTH DAILY 03/05/22  Yes Jettie Booze, MD  loratadine (CLARITIN) 10 MG tablet Take 1 tablet (10 mg total) by mouth daily. Patient taking differently: Take 10 mg by mouth daily as needed for allergies. 04/06/21  Yes Cobb, Karie Schwalbe, NP  nitroGLYCERIN (NITROSTAT) 0.4 MG SL tablet Place 1 tablet (0.4 mg total) under the tongue every 5 (five) minutes as needed for chest pain (X3 DOSES MAX). 02/16/21  Yes Bhagat, Bhavinkumar, PA  oxyCODONE-acetaminophen (PERCOCET) 7.5-325 MG tablet Take 1-2 tablets by mouth every 4 (four) hours as needed for moderate pain (back pain).   Yes [provider]  Tiotropium  Bromide-Olodaterol (STIOLTO RESPIMAT) 2.5-2.5 MCG/ACT AERS Inhale 2 puffs into the lungs daily. 03/28/22  Yes Collene Gobble, MD  naloxone [facility]) nasal spray 4 mg/0.1 mL SMARTSIG:Both Nares 02/26/22   [provider]  pantoprazole (PROTONIX) 40 MG tablet Take 1 tablet (40 mg total) by mouth 2 (two) times daily. 05/08/22 06/07/22  Patrecia Pour, MD  sucralfate (CARAFATE) 1 GM/10ML suspension Take 10 mLs (1 g total) by mouth 2 (two) times daily for 14 days. 05/08/22 05/22/22  Patrecia Pour, MD    Inpatient Medications: Scheduled Meds:  [MAR Hold] arformoterol  15 mcg Nebulization BID   And   [MAR Hold] umeclidinium bromide  1 puff Inhalation Daily   [MAR Hold] cyanocobalamin  1,000 mcg Intramuscular Daily   [MAR Hold] furosemide  20 mg Oral Daily   [MAR Hold] isosorbide mononitrate  60 mg Oral BID   [MAR Hold] nicotine  21 mg Transdermal Daily   [MAR Hold] pantoprazole  40 mg Oral BID   [  MAR Hold] potassium chloride  20 mEq Oral Once   [MAR Hold] sucralfate  1 g Oral BID   Continuous Infusions:  PRN Meds: [MAR Hold] acetaminophen, [MAR Hold] albuterol, clopidogrel, fentaNYL, lidocaine (PF), midazolam, [MAR Hold] nitroGLYCERIN, [MAR Hold] oxyCODONE-acetaminophen  Allergies:    Allergies  Allergen Reactions   Codeine Itching and Swelling   Novocain [Procaine] Hives and Swelling    Eyes swelling    Social History:   Social History   Socioeconomic History   Marital status: Legally Separated    Spouse name: Not on file   Number of children: Not on file   Years of education: Not on file   Highest education level: Not on file  Occupational History   Not on file  Tobacco Use   Smoking status: Some Days    Packs/day: 0.50    Years: 54.00    Total pack years: 27.00    Types: Cigarettes    Start date: 1968   Smokeless tobacco: Never   Tobacco comments:    2 cigarettes smoked daily. 03/28/22 ARJ   Vaping Use   Vaping Use: Never used  Substance and Sexual Activity    Alcohol use: No   Drug use: No   Sexual activity: Not on file  Other Topics Concern   Not on file  Social History Narrative   Married, 4 children.    Social Determinants of Health   Financial Resource Strain: Not on file  Food Insecurity: No Food Insecurity (05/06/2022)   Hunger Vital Sign    Worried About Running Out of Food in the Last Year: Never true    Ran Out of Food in the Last Year: Never true  Transportation Needs: No Transportation Needs (05/06/2022)   PRAPARE - Hydrologist (Medical): No    [name] (Non-Medical): No  Physical Activity: Not on file  Stress: Not on file  Social Connections: Not on file  Intimate Partner Violence: Not At Risk (05/06/2022)   Humiliation, Afraid, Rape, and Kick questionnaire    Fear of Current or Ex-Partner: No    Emotionally Abused: No    Physically Abused: No    Sexually Abused: No    Family History:   Family History  Problem Relation Age of Onset   Heart attack Mother    Heart disease Mother    Breast cancer Maternal Aunt      ROS:  Please see the history of present illness.  All other ROS reviewed and negative.     Physical Exam/Data:   Vitals:   05/08/22 0415 05/08/22 0714 05/08/22 0800 05/08/22 0836  BP: (!) 147/70  (!) 119/57   Pulse:  80 77   Resp:  14    Temp: 97.8 F (36.6 C)     TempSrc: Oral     SpO2:  99%  96%  Weight:      Height:        Intake/Output Summary (Last 24 hours) at 05/08/2022 0849 Last data filed at 05/08/2022 0416 Gross per 24 hour  Intake 120 ml  Output 1050 ml  Net -930 ml      05/06/2022    8:55 PM 04/23/2022    9:10 AM 03/28/2022   11:07 AM  Last 3 Weights  Weight (lbs) 154 lb 5.2 oz 153 lb 153 lb  Weight (kg) 70 kg 69.4 kg 69.4 kg     Body mass index is 27.34 kg/m.  General:  Well nourished, well developed,  in no acute distress HEENT: normal Neck: no JVD Vascular: No carotid bruits; Distal pulses 2+ bilaterally Cardiac:  normal S1, S2;  RRR; no murmur  Lungs:  clear to auscultation bilaterally, no wheezing, rhonchi or rales  Abd: soft, nontender, no hepatomegaly  Ext: no edema Musculoskeletal:  No deformities, BUE and BLE strength normal and equal Skin: warm and dry  Neuro:  CNs 2-12 intact, no focal abnormalities noted Psych:  Normal affect   EKG:  The EKG was personally reviewed and demonstrates:  Sinus bradycardia 53 bpm, inferior and anterolateral ST segment elevation consistent with acute STEMI  Telemetry:  Telemetry was personally reviewed and demonstrates: Sinus bradycardia with episodic VT  Relevant CV Studies: 2D echocardiogram 03/29/2022: 1. Left ventricular ejection fraction, by estimation, is 50 to 55%. The  left ventricle has low normal function. The left ventricle demonstrates  regional wall motion abnormalities (see scoring diagram/findings for  description). Left ventricular diastolic   parameters were normal.   2. Right ventricular systolic function is normal. The right ventricular  size is normal.   3. The mitral valve is normal in structure. Mild mitral valve  regurgitation. No evidence of mitral stenosis.   4. The aortic valve is tricuspid. Aortic valve regurgitation is not  visualized. No aortic stenosis is present.   5. The inferior vena cava is normal in size with greater than 50%  respiratory variability, suggesting right atrial pressure of 3 mmHg.   Laboratory Data:  High Sensitivity Troponin:   Recent Labs  Lab 05/05/22 1354 05/06/22 0317 05/08/22 0552  TROPONINIHS <2 3 33*     Chemistry Recent Labs  Lab 05/06/22 0317 05/07/22 0048 05/08/22 0552  NA 136 137 139  K 4.0 3.2* 3.4*  CL 111 107 107  CO2 18* 21* 24  GLUCOSE 103* 100* 116*  BUN 17 11 11   CREATININE 1.12* 0.98 0.94  CALCIUM 8.2* 8.2* 8.7*  MG 1.7 2.1  --   GFRNONAA 51* 60* >60  ANIONGAP 7 9 8     Recent Labs  Lab 05/05/22 1354 05/06/22 0317  PROT 5.3* 4.8*  ALBUMIN 3.2* 3.1*  AST 16 14*  ALT 13 10   ALKPHOS 55 52  BILITOT 0.6 0.8   Lipids No results for input(s): "CHOL", "TRIG", "HDL", "LABVLDL", "LDLCALC", "CHOLHDL" in the last 168 hours.  Hematology Recent Labs  Lab 05/06/22 2138 05/07/22 0048 05/08/22 0552  WBC 7.6 5.9 4.9  RBC 3.34* 3.13* 3.33*  HGB 10.7* 10.0* 10.6*  HCT 31.5* 30.7* 32.0*  MCV 94.3 98.1 96.1  MCH 32.0 31.9 31.8  MCHC 34.0 32.6 33.1  RDW 12.8 12.9 12.7  PLT 129* 124* 119*   Thyroid  Recent Labs  Lab 05/06/22 0317  TSH 1.004    BNPNo results for input(s): "BNP", "PROBNP" in the last 168 hours.  DDimer No results for input(s): "DDIMER" in the last 168 hours.   Radiology/Studies:  IR EMBO ART  VEN HEMORR LYMPH EXTRAV  INC GUIDE ROADMAPPING  Result Date: 05/06/2022 INDICATION: Hematochezia.  Hypotension. EXAM: Procedures: 1. ABDOMINAL AORTOGRAPHY 2. INFERIOR MESENTERIC ARTERY ARTERIOGRAPHY 3. COIL EMBOLIZATION OF ACTIVE EXTRAVASATION AT DISTAL DESCENDING COLON / PROXIMAL SIGMOID COLON COMPARISON:  CT AP, earlier same day. MEDICATIONS: Benadryl 25 mg IV.  Ancef 2 g IV. ANESTHESIA/SEDATION: Moderate (conscious) sedation was employed during this procedure. A total of Versed 2 mg and Fentanyl 100 mcg was administered intravenously. Moderate Sedation Time: 85 minutes. The patient's level of consciousness and vital signs were monitored continuously by radiology nursing  throughout the procedure under my direct supervision. CONTRAST:  65 mL Omnipaque 300 FLUOROSCOPY TIME:  Fluoroscopic dose; 387 mGy COMPLICATIONS: None immediate. PROCEDURE: Informed consent was obtained from the patient and/or patient's representative following explanation of the procedure, risks, benefits and alternatives. All questions were addressed. A time out was performed prior to the initiation of the procedure. Maximal barrier sterile technique utilized including caps, mask, sterile gowns, sterile gloves, large sterile drape, hand hygiene, and Betadine prep. The RIGHT femoral head was marked  fluoroscopically. Under sterile conditions and local anesthesia, the RIGHT common femoral artery access was performed with a micropuncture needle. Under direct ultrasound guidance, the RIGHT common femoral was accessed with a micropuncture kit. An ultrasound image was saved for documentation purposes. This allowed for placement of a 5 Fr vascular sheath. A limited arteriogram was performed through the side arm of the sheath confirming appropriate access within the RIGHT common femoral artery. The short sheath was then exchanged for a 5 Fr, 35 cm Brite tip for additional stability. A limited abdominal aortogram was performed to help identify the IMA origin. Over a Bentson wire, a Sos catheter was advanced, reformed, back bled and flushed. The catheter was then utilized to select the inferior mesenteric artery and a selective inferior mesenteric arteriogram was performed. Using a 2.5 Fr lantern microcatheter and microwire, access into the distal LEFT colic/sigmoid artery branch vessels was performed and selective arteriography was obtained to identify the bleeding vessel. Super selective coil embolization was then performed using multiple non detachable micro coils. A post embolization arteriogram was performed without evidence of residual extravasation. Images were reviewed and the procedure was terminated. All wires, catheters and sheaths were removed from the patient. Hemostasis was achieved at the RIGHT groin access site with Angio-Seal closure device. The patient tolerated the procedure well without immediate post procedural complication. FINDINGS: - access via the RIGHT femoral artery. - Focus of active extravasation from IMA territory, distal LEFT colic / sigmoid artery branch vessel - Successful embolization without residual extravasation - Angio-Seal closure at RIGHT groin. Palpable RLE pulses at the end of the Case IMPRESSION: Successful super-selective coil embolization of focus of active extravasation from  IMA territory, distal LEFT colic/sigmoid artery branch vessel, as above. PLAN: - The patient is to remain flat for 2 hours with RIGHT leg straight. - The patient may continue to experience several additional bloody bowel movements however should stabilize in the coming days. Michaelle Birks, MD [facility] Radiology Electronically Signed   By: Michaelle Birks M.D.   On: 05/06/2022 00:41   IR US Guide Vasc Access Right  Result Date: 05/06/2022 INDICATION: Hematochezia.  Hypotension. EXAM: Procedures: 1. ABDOMINAL AORTOGRAPHY 2. INFERIOR MESENTERIC ARTERY ARTERIOGRAPHY 3. COIL EMBOLIZATION OF ACTIVE EXTRAVASATION AT DISTAL DESCENDING COLON / PROXIMAL SIGMOID COLON COMPARISON:  CT AP, earlier same day. MEDICATIONS: Benadryl 25 mg IV.  Ancef 2 g IV. ANESTHESIA/SEDATION: Moderate (conscious) sedation was employed during this procedure. A total of Versed 2 mg and Fentanyl 100 mcg was administered intravenously. Moderate Sedation Time: 85 minutes. The patient's level of consciousness and vital signs were monitored continuously by radiology nursing throughout the procedure under my direct supervision. CONTRAST:  65 mL Omnipaque 300 FLUOROSCOPY TIME:  Fluoroscopic dose; 564 mGy COMPLICATIONS: None immediate. PROCEDURE: Informed consent was obtained from the patient and/or patient's representative following explanation of the procedure, risks, benefits and alternatives. All questions were addressed. A time out was performed prior to the initiation of the procedure. Maximal barrier sterile  technique utilized including caps, mask, sterile gowns, sterile gloves, large sterile drape, hand hygiene, and Betadine prep. The RIGHT femoral head was marked fluoroscopically. Under sterile conditions and local anesthesia, the RIGHT common femoral artery access was performed with a micropuncture needle. Under direct ultrasound guidance, the RIGHT common femoral was accessed with a micropuncture  kit. An ultrasound image was saved for documentation purposes. This allowed for placement of a 5 Fr vascular sheath. A limited arteriogram was performed through the side arm of the sheath confirming appropriate access within the RIGHT common femoral artery. The short sheath was then exchanged for a 5 Fr, 35 cm Brite tip for additional stability. A limited abdominal aortogram was performed to help identify the IMA origin. Over a Bentson wire, a Sos catheter was advanced, reformed, back bled and flushed. The catheter was then utilized to select the inferior mesenteric artery and a selective inferior mesenteric arteriogram was performed. Using a 2.5 Fr lantern microcatheter and microwire, access into the distal LEFT colic/sigmoid artery branch vessels was performed and selective arteriography was obtained to identify the bleeding vessel. Super selective coil embolization was then performed using multiple non detachable micro coils. A post embolization arteriogram was performed without evidence of residual extravasation. Images were reviewed and the procedure was terminated. All wires, catheters and sheaths were removed from the patient. Hemostasis was achieved at the RIGHT groin access site with Angio-Seal closure device. The patient tolerated the procedure well without immediate post procedural complication. FINDINGS: - access via the RIGHT femoral artery. - Focus of active extravasation from IMA territory, distal LEFT colic / sigmoid artery branch vessel - Successful embolization without residual extravasation - Angio-Seal closure at RIGHT groin. Palpable RLE pulses at the end of the Case IMPRESSION: Successful super-selective coil embolization of focus of active extravasation from IMA territory, distal LEFT colic/sigmoid artery branch vessel, as above. PLAN: - The patient is to remain flat for 2 hours with RIGHT leg straight. - The patient may continue to experience several additional bloody bowel movements however  should stabilize in the coming days. Michaelle Birks, MD [facility] Radiology Electronically Signed   By: Michaelle Birks M.D.   On: 05/06/2022 00:41   IR Angiogram Visceral Selective  Result Date: 05/06/2022 INDICATION: Hematochezia.  Hypotension. EXAM: Procedures: 1. ABDOMINAL AORTOGRAPHY 2. INFERIOR MESENTERIC ARTERY ARTERIOGRAPHY 3. COIL EMBOLIZATION OF ACTIVE EXTRAVASATION AT DISTAL DESCENDING COLON / PROXIMAL SIGMOID COLON COMPARISON:  CT AP, earlier same day. MEDICATIONS: Benadryl 25 mg IV.  Ancef 2 g IV. ANESTHESIA/SEDATION: Moderate (conscious) sedation was employed during this procedure. A total of Versed 2 mg and Fentanyl 100 mcg was administered intravenously. Moderate Sedation Time: 85 minutes. The patient's level of consciousness and vital signs were monitored continuously by radiology nursing throughout the procedure under my direct supervision. CONTRAST:  65 mL Omnipaque 300 FLUOROSCOPY TIME:  Fluoroscopic dose; 546 mGy COMPLICATIONS: None immediate. PROCEDURE: Informed consent was obtained from the patient and/or patient's representative following explanation of the procedure, risks, benefits and alternatives. All questions were addressed. A time out was performed prior to the initiation of the procedure. Maximal barrier sterile technique utilized including caps, mask, sterile gowns, sterile gloves, large sterile drape, hand hygiene, and Betadine prep. The RIGHT femoral head was marked fluoroscopically. Under sterile conditions and local anesthesia, the RIGHT common femoral artery access was performed with a micropuncture needle. Under direct ultrasound guidance, the RIGHT common femoral was accessed with a micropuncture kit. An ultrasound image was saved for documentation purposes.  This allowed for placement of a 5 Fr vascular sheath. A limited arteriogram was performed through the side arm of the sheath confirming appropriate access within the RIGHT  common femoral artery. The short sheath was then exchanged for a 5 Fr, 35 cm Brite tip for additional stability. A limited abdominal aortogram was performed to help identify the IMA origin. Over a Bentson wire, a Sos catheter was advanced, reformed, back bled and flushed. The catheter was then utilized to select the inferior mesenteric artery and a selective inferior mesenteric arteriogram was performed. Using a 2.5 Fr lantern microcatheter and microwire, access into the distal LEFT colic/sigmoid artery branch vessels was performed and selective arteriography was obtained to identify the bleeding vessel. Super selective coil embolization was then performed using multiple non detachable micro coils. A post embolization arteriogram was performed without evidence of residual extravasation. Images were reviewed and the procedure was terminated. All wires, catheters and sheaths were removed from the patient. Hemostasis was achieved at the RIGHT groin access site with Angio-Seal closure device. The patient tolerated the procedure well without immediate post procedural complication. FINDINGS: - access via the RIGHT femoral artery. - Focus of active extravasation from IMA territory, distal LEFT colic / sigmoid artery branch vessel - Successful embolization without residual extravasation - Angio-Seal closure at RIGHT groin. Palpable RLE pulses at the end of the Case IMPRESSION: Successful super-selective coil embolization of focus of active extravasation from IMA territory, distal LEFT colic/sigmoid artery branch vessel, as above. PLAN: - The patient is to remain flat for 2 hours with RIGHT leg straight. - The patient may continue to experience several additional bloody bowel movements however should stabilize in the coming days. Michaelle Birks, MD [facility] Radiology Electronically Signed   By: Michaelle Birks M.D.   On: 05/06/2022 00:41   IR Aortagram Abdominal  Serialogram  Result Date: 05/06/2022 INDICATION: Hematochezia.  Hypotension. EXAM: Procedures: 1. ABDOMINAL AORTOGRAPHY 2. INFERIOR MESENTERIC ARTERY ARTERIOGRAPHY 3. COIL EMBOLIZATION OF ACTIVE EXTRAVASATION AT DISTAL DESCENDING COLON / PROXIMAL SIGMOID COLON COMPARISON:  CT AP, earlier same day. MEDICATIONS: Benadryl 25 mg IV.  Ancef 2 g IV. ANESTHESIA/SEDATION: Moderate (conscious) sedation was employed during this procedure. A total of Versed 2 mg and Fentanyl 100 mcg was administered intravenously. Moderate Sedation Time: 85 minutes. The patient's level of consciousness and vital signs were monitored continuously by radiology nursing throughout the procedure under my direct supervision. CONTRAST:  65 mL Omnipaque 300 FLUOROSCOPY TIME:  Fluoroscopic dose; 102 mGy COMPLICATIONS: None immediate. PROCEDURE: Informed consent was obtained from the patient and/or patient's representative following explanation of the procedure, risks, benefits and alternatives. All questions were addressed. A time out was performed prior to the initiation of the procedure. Maximal barrier sterile technique utilized including caps, mask, sterile gowns, sterile gloves, large sterile drape, hand hygiene, and Betadine prep. The RIGHT femoral head was marked fluoroscopically. Under sterile conditions and local anesthesia, the RIGHT common femoral artery access was performed with a micropuncture needle. Under direct ultrasound guidance, the RIGHT common femoral was accessed with a micropuncture kit. An ultrasound image was saved for documentation purposes. This allowed for placement of a 5 Fr vascular sheath. A limited arteriogram was performed through the side arm of the sheath confirming appropriate access within the RIGHT common femoral artery. The short sheath was then exchanged for a 5 Fr, 35 cm Brite tip for additional stability. A limited abdominal aortogram was performed to help identify the IMA origin. Over a Bentson wire,  a Sos  catheter was advanced, reformed, back bled and flushed. The catheter was then utilized to select the inferior mesenteric artery and a selective inferior mesenteric arteriogram was performed. Using a 2.5 Fr lantern microcatheter and microwire, access into the distal LEFT colic/sigmoid artery branch vessels was performed and selective arteriography was obtained to identify the bleeding vessel. Super selective coil embolization was then performed using multiple non detachable micro coils. A post embolization arteriogram was performed without evidence of residual extravasation. Images were reviewed and the procedure was terminated. All wires, catheters and sheaths were removed from the patient. Hemostasis was achieved at the RIGHT groin access site with Angio-Seal closure device. The patient tolerated the procedure well without immediate post procedural complication. FINDINGS: - access via the RIGHT femoral artery. - Focus of active extravasation from IMA territory, distal LEFT colic / sigmoid artery branch vessel - Successful embolization without residual extravasation - Angio-Seal closure at RIGHT groin. Palpable RLE pulses at the end of the Case IMPRESSION: Successful super-selective coil embolization of focus of active extravasation from IMA territory, distal LEFT colic/sigmoid artery branch vessel, as above. PLAN: - The patient is to remain flat for 2 hours with RIGHT leg straight. - The patient may continue to experience several additional bloody bowel movements however should stabilize in the coming days. Michaelle Birks, MD [facility] Radiology Electronically Signed   By: Michaelle Birks M.D.   On: 05/06/2022 00:41   CT ANGIO GI BLEED  Result Date: 05/05/2022 CLINICAL DATA:  Lower GI bleeding. EXAM: CTA ABDOMEN AND PELVIS WITHOUT AND WITH CONTRAST TECHNIQUE: Multidetector CT imaging of the abdomen and pelvis was performed using the standard protocol during bolus  administration of intravenous contrast. Multiplanar reconstructed images and MIPs were obtained and reviewed to evaluate the vascular anatomy. RADIATION DOSE REDUCTION: This exam was performed according to the departmental dose-optimization program which includes automated exposure control, adjustment of the mA and/or kV according to patient size and/or use of iterative reconstruction technique. CONTRAST:  169mL OMNIPAQUE IOHEXOL 350 MG/ML SOLN COMPARISON:  PET-CT 02/22/2022. FINDINGS: VASCULAR Aorta: Normal caliber aorta without aneurysm, dissection, vasculitis or significant stenosis. There are atherosclerotic calcifications throughout the aorta. Celiac: Patent without evidence of aneurysm, dissection, vasculitis or significant stenosis. SMA: Patent without evidence of aneurysm, dissection, vasculitis or significant stenosis. Renals: Both renal arteries are patent without evidence of aneurysm, dissection, vasculitis, fibromuscular dysplasia or significant stenosis. IMA: Patent without evidence of aneurysm, dissection, vasculitis or significant stenosis. Inflow: Patent without evidence of aneurysm, dissection, vasculitis or significant stenosis. Atherosclerotic calcifications are present. Proximal Outflow: Bilateral common femoral and visualized portions of the superficial and profunda femoral arteries are patent without evidence of aneurysm, dissection, vasculitis or significant stenosis. Veins: Within normal limits. Review of the MIP images confirms the above findings. NON-VASCULAR Lower chest: Scarring in the right lung base is unchanged. Hepatobiliary: There are 3 subcentimeter rounded hypodensities in the right lobe of the liver. The larger are unchanged from prior in the smaller are too small to characterize. These are favored as cysts or hemangiomas. Gallbladder surgically absent. There is no biliary ductal dilatation. Pancreas: Unremarkable. No pancreatic ductal dilatation or surrounding inflammatory  changes. Spleen: Normal in size without focal abnormality. Adrenals/Urinary Tract: There are subcentimeter cortical cysts in each kidney. Otherwise, the kidneys, adrenal glands and bladder are within normal limits. Stomach/Bowel: There is active arterial extravasation of contrast/bleeding within the descending/sigmoid colon junction. There is no focal wall thickening or inflammation in this region. There is  no bowel obstruction or free air. There is diffuse colonic diverticulosis. The appendix, stomach and small bowel loops appear normal. Lymphatic: No enlarged lymph nodes are identified. Reproductive: Status post hysterectomy. No adnexal masses. Other: No abdominal wall hernia or abnormality. No abdominopelvic ascites. Musculoskeletal: Degenerative changes affect the spine. IMPRESSION: 1. Active bleeding within the descending/sigmoid colon junction. No focal wall thickening or inflammation. Aortic Atherosclerosis (ICD10-I70.0). NON-VASCULAR 1. No other acute localizing process in the abdomen or pelvis. 2. Subcentimeter Bosniak II benign renal cyst. No follow-up imaging is recommended. JACR 2018 Feb; 264-273, Management of the Incidental Renal Mass on CT, [employer] 2021; 814-848, Bosniak Classification of Cystic Renal Masses, Version 2019. Electronically Signed   By: Ronney Asters M.D.   On: 05/05/2022 18:08   DG Chest Port 1 View  Result Date: 05/05/2022 CLINICAL DATA:  Weakness. EXAM: PORTABLE CHEST 1 VIEW COMPARISON:  Chest radiograph dated 12/09/2021 and CT chest dated 02/04/2022. FINDINGS: The heart size and mediastinal contours are within normal limits. Vascular calcifications are seen in the aortic arch. The patient's known bilateral pulmonary nodules are better seen on prior CT. No new focal consolidation, pleural effusion, or pneumothorax is identified. The visualized skeletal structures are unremarkable. A left subclavian approach cardiac device and median sternotomy wires are redemonstrated.  IMPRESSION: No acute pulmonary process. The patient's known bilateral pulmonary nodules are better seen on prior CT. Electronically Signed   By: Zerita Boers M.D.   On: 05/05/2022 14:36     Assessment and Plan:   Acute inferior and anterolateral STEMI: Patient's history and cardiac catheterization studies are reviewed.  She has a remote history of multiple MIs and ICD implantation.  In 2008 she presented with STEMI and ventricular fibrillation cardiac arrest.  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  She had a remarkably good recovery and has been maintained on long-term DAPT with aspirin and clopidogrel.  She now presents with painless hematochezia and is found to have a lower gastrointestinal bleed treated with coil embolization.  Her hemoglobin has been stable over the past few days, but this morning she developed acute chest pain and EKG changes diagnostic of acute STEMI.  Emergency cardiac catheterization and primary PCI is indicated.  The patient understands there may be an increased risk of recurrent lower GI bleeding, but fortunately she has had definitive treatment with coil embolization by interventional radiology.  Further plans/disposition pending her cardiac catheterization results.  I personally discussed her case with Dr. Claiborne Billings who is present at the patient's bedside and participated in her evaluation.  He will perform her procedure.  The patient's clopidogrel has been held for 3 days and we will reload her with 600 mg before doing her cath procedure.  The procedure is discussed in detail with her and emergency consent is obtained. Lower GI bleed: Data reviewed, will follow her H&H closely. Mixed hyperlipidemia: Changed from atorvastatin to rosuvastatin because of myalgias in November 2023.  Will check follow-up lipids Tobacco abuse: Will revisit cessation counseling.  She has had extensive outpatient  counseling. Disposition: Pending cardiac catheterization results.  Patient loaded with clopidogrel 600 mg orally.  To be taken to the Cath Lab emergently as outlined above.  The patient is critically ill with multiple organ systems failure and requires high complexity decision making for assessment and support, frequent evaluation and titration of therapies, application of advanced monitoring technologies and extensive interpretation of multiple databases.   Critical Care Time  devoted to patient care services described in this note is 60 minutes    Risk Assessment/Risk Scores:     TIMI Risk Score for ST  Elevation MI:   The patient's TIMI risk score is 4, which indicates a 7.3% risk of all cause mortality at 30 days.           For questions or updates, please contact [place] Please consult [email or web address] for contact info under    Signed, Sherren Mocha, MD  05/08/2022 8:49 AM

## 2022-05-08 NOTE — Significant Event (Signed)
Rapid Response Event Note   Reason for Call :  Chest Pain, EKG reading STEMI HR 40s, BP 90/53  Initial Focused Assessment:  Upon my arrival patient is complaining of CP 5/10 which is much better.  Initially she states her pain was a 10/10. Initial EKG shows STEMI ST elevation has resolved.    BP 119/57  SR 70s  RR 20  O2 sat 100% on [place]  Dr Bonner Puna and Dr Burt Knack at bedside to assess patient  Interventions:  75 mg Plavix 1 SL NTG Scheduled Imdur Urgently transported to Cath Lab Hand off with Cath lab staff  Plan of Care:  RN to call if patient has additional CP post cardiac cath.   Event Summary:   MD Notified: Bonner Puna Call Time: 8309 Arrival Time: 4076 End Time: 0830  Raliegh Ip, RN

## 2022-05-08 NOTE — Care Management Important Message (Signed)
Important Message  Patient Details  Name: Karen [name] MRN: [id number] Date of Birth: 1945-09-24   Medicare Important Message Given:  Yes     [name] 05/08/2022, 1:09 PM

## 2022-05-08 NOTE — Progress Notes (Signed)
After shift change nurse was notified that the patient was Karen [name] and stating in the 40's by the unit Director. Nurse went to assess the patient and the patient stated she didn't feel good and was having chest pain 10/10. An EKG was done and oxygen was started at 6L. Dr. Bonner Puna was notified of the results and reviewed the EKG. Stated he was coming to bedside and gave orders to give Nitro, Oxygen, and Plavix. Rapid Response was notified and came to bedside. Nitro and Plavix was given and a second EKG was done. A Code STEMI was called and the STEMI team came to bedside along with Dr. Bonner Puna. It was decided the patient would go to cath lab. Nurse escorted patient to the cath lab and report was given face-to-face.

## 2022-05-09 ENCOUNTER — Encounter (HOSPITAL_COMMUNITY): Payer: Self-pay | Admitting: Cardiovascular Disease

## 2022-05-09 DIAGNOSIS — I2119 ST elevation (STEMI) myocardial infarction involving other coronary artery of inferior wall: Secondary | ICD-10-CM

## 2022-05-09 DIAGNOSIS — D62 Acute posthemorrhagic anemia: Secondary | ICD-10-CM

## 2022-05-09 LAB — CBC
HCT: 28.5 % — ABNORMAL LOW (ref 36.0–46.0)
Hemoglobin: 9.6 g/dL — ABNORMAL LOW (ref 12.0–15.0)
MCH: 33 pg (ref 26.0–34.0)
MCHC: 33.7 g/dL (ref 30.0–36.0)
MCV: 97.9 fL (ref 80.0–100.0)
Platelets: 125 10*3/uL — ABNORMAL LOW (ref 150–400)
RBC: 2.91 MIL/uL — ABNORMAL LOW (ref 3.87–5.11)
RDW: 12.8 % (ref 11.5–15.5)
WBC: 4.3 10*3/uL (ref 4.0–10.5)
nRBC: 0 % (ref 0.0–0.2)

## 2022-05-09 LAB — BASIC METABOLIC PANEL
Anion gap: 8 (ref 5–15)
BUN: 11 mg/dL (ref 8–23)
CO2: 22 mmol/L (ref 22–32)
Calcium: 8.1 mg/dL — ABNORMAL LOW (ref 8.9–10.3)
Chloride: 107 mmol/L (ref 98–111)
Creatinine, Ser: 0.93 mg/dL (ref 0.44–1.00)
GFR, Estimated: >60 mL/min (ref >60)
Glucose, Bld: 110 mg/dL — ABNORMAL HIGH (ref 70–99)
Potassium: 3.6 mmol/L (ref 3.5–5.1)
Sodium: 137 mmol/L (ref 135–145)

## 2022-05-09 LAB — POCT ACTIVATED CLOTTING TIME
Activated Clotting Time: 277 seconds
Activated Clotting Time: 542 seconds
Activated Clotting Time: 379 seconds

## 2022-05-09 LAB — TROPONIN I (HIGH SENSITIVITY): Troponin I (High Sensitivity): 1187 ng/L (ref <18)

## 2022-05-09 MED ORDER — POTASSIUM CHLORIDE CRYS ER 20 MEQ PO TBCR
40.0000 meq | EXTENDED_RELEASE_TABLET | Freq: Once | ORAL | Status: AC
  Administered 2022-05-09: 40 meq via ORAL
  Filled 2022-05-09: qty 2

## 2022-05-09 MED ORDER — ROSUVASTATIN CALCIUM 20 MG PO TABS
20.0000 mg | ORAL_TABLET | Freq: Every day | ORAL | Status: DC
  Administered 2022-05-10 – 2022-05-16 (×7): 20 mg via ORAL
  Filled 2022-05-09 (×7): qty 1

## 2022-05-09 NOTE — Progress Notes (Signed)
Mobility Specialist Progress Note    05/09/22 1154  Mobility  Activity Ambulated with assistance in room  Level of Assistance Minimal assist, patient does 75% or more  Assistive Device Front wheel walker  Distance Ambulated (ft) 15 ft  Activity Response Tolerated fair  Mobility Referral Yes  $Mobility charge 1 Mobility   Pre-Mobility: 91 HR, 100% SpO2 During Mobility: 100 HR Post-Mobility: 98 HR  Pt received in bed and agreeable to sit up in chair for lunch. C/o legs feeling weak. Left with call bell in reach. RN aware.   Hildred Alamin Mobility Specialist  Please Psychologist, sport and exercise or Rehab Office at [phone]

## 2022-05-09 NOTE — Progress Notes (Addendum)
Pt declined amb d/t wanting rest, Pt was educated on STEMI, angioplasty, Plavix and ASA use, wt restrictions, no baths/daily wash-ups, s/s of infection, ex guidelines (progressive walking and resistance exercise), s/s to stop exercising, NTG use and calling 911, heart healthy diet, risk factors (smoking), and CRPII. Pt received MI book and materials on exercise, diet, and smoking cessation. Will refer to [facility].   Reports angina at home with activity that is relieved with rest or relieved with NTG. Reports Imdur helps.   Christen Bame 05/09/2022 8:51 AM   [phone]

## 2022-05-09 NOTE — Progress Notes (Addendum)
Rounding Note    Patient Name: Karen [name] Date of Encounter: 05/09/2022  [place] Cardiologist: Larae Grooms, MD  Electrophysiologist:  Virl Axe, MD   Subjective   Pt is doing well this morning with no return of her chest pain and no other new or worsening symptoms. Right femoral access has not bothered her and she was able to get some sleep last night.  Inpatient Medications    Scheduled Meds:  arformoterol  15 mcg Nebulization BID   And   umeclidinium bromide  1 puff Inhalation Daily   aspirin  81 mg Oral Daily   atorvastatin  80 mg Oral Daily   clopidogrel  75 mg Oral Q breakfast   cyanocobalamin  1,000 mcg Intramuscular Daily   furosemide  20 mg Oral Daily   isosorbide mononitrate  60 mg Oral BID   nicotine  21 mg Transdermal Daily   pantoprazole  40 mg Oral BID   potassium chloride  20 mEq Oral Once   sodium chloride flush  3 mL Intravenous Q12H   sucralfate  1 g Oral BID   Continuous Infusions:  sodium chloride     PRN Meds: sodium chloride, acetaminophen, acetaminophen, albuterol, diazepam, nitroGLYCERIN, oxyCODONE-acetaminophen, sodium chloride flush   Vital Signs    Vitals:   05/08/22 1946 05/08/22 2358 05/09/22 0402 05/09/22 0810  BP: 117/66 (!) 101/58 (!) 98/50 (!) 97/45  Pulse: 77 84 77 88  Resp: 17 15 19 17   Temp: 98.7 F (37.1 C) 98.4 F (36.9 C) 98.1 F (36.7 C) 98 F (36.7 C)  TempSrc: Oral Oral Oral Oral  SpO2: 98% 96% 100% 98%  Weight:      Height:        Intake/Output Summary (Last 24 hours) at 05/09/2022 0913 Last data filed at 05/09/2022 0814 Gross per 24 hour  Intake 1448.67 ml  Output 1300 ml  Net 148.67 ml      05/06/2022    8:55 PM 04/23/2022    9:10 AM 03/28/2022   11:07 AM  Last 3 Weights  Weight (lbs) 154 lb 5.2 oz 153 lb 153 lb  Weight (kg) 70 kg 69.4 kg 69.4 kg      Telemetry    NSR with occasional PVCs - Personally Reviewed  ECG    NSR, rate 78, flipped T waves in leads I, II,  V3-V6, stable from yesterday  - Personally Reviewed  Physical Exam   GEN: No acute distress.   Neck: No JVD Cardiac: RRR, no murmurs, rubs, or gallops.  Respiratory: Clear to auscultation bilaterally. GI: Soft, nontender, non-distended  MS: No edema; No deformity. Right femoral access without edema or bleeding Neuro:  Nonfocal  Psych: Normal affect   Labs    High Sensitivity Troponin:   Recent Labs  Lab 05/05/22 1354 05/06/22 0317 05/08/22 0552 05/08/22 1543 05/09/22 0617  TROPONINIHS <2 3 33* 2,614* 1,187*     Chemistry Recent Labs  Lab 05/05/22 1354 05/06/22 0317 05/07/22 0048 05/08/22 0552 05/09/22 0617  NA 140 136 137 139 137  K 3.8 4.0 3.2* 3.4* 3.6  CL 106 111 107 107 107  CO2 26 18* 21* 24 22  GLUCOSE 111* 103* 100* 116* 110*  BUN 12 17 11 11 11   CREATININE 1.20* 1.12* 0.98 0.94 0.93  CALCIUM 8.6* 8.2* 8.2* 8.7* 8.1*  MG  --  1.7 2.1  --   --   PROT 5.3* 4.8*  --   --   --  ALBUMIN 3.2* 3.1*  --   --   --   AST 16 14*  --   --   --   ALT 13 10  --   --   --   ALKPHOS 55 52  --   --   --   BILITOT 0.6 0.8  --   --   --   GFRNONAA 47* 51* 60* >60 >60  ANIONGAP 8 7 9 8 8     Lipids No results for input(s): "CHOL", "TRIG", "HDL", "LABVLDL", "LDLCALC", "CHOLHDL" in the last 168 hours.  Hematology Recent Labs  Lab 05/07/22 0048 05/08/22 0552 05/09/22 0617  WBC 5.9 4.9 4.3  RBC 3.13* 3.33* 2.91*  HGB 10.0* 10.6* 9.6*  HCT 30.7* 32.0* 28.5*  MCV 98.1 96.1 97.9  MCH 31.9 31.8 33.0  MCHC 32.6 33.1 33.7  RDW 12.9 12.7 12.8  PLT 124* 119* 125*   Thyroid  Recent Labs  Lab 05/06/22 0317  TSH 1.004    BNPNo results for input(s): "BNP", "PROBNP" in the last 168 hours.  DDimer No results for input(s): "DDIMER" in the last 168 hours.   Radiology    CARDIAC CATHETERIZATION  Result Date: 05/08/2022   Ost LAD to Prox LAD lesion is 30% stenosed.   Ramus lesion is 40% stenosed.   Prox Cx to Mid Cx lesion is 75% stenosed.   Mid RCA lesion is 70%  stenosed.   Origin lesion is 85% stenosed.   Prox Graft to Mid Graft lesion is 40% stenosed.   Mid Graft lesion is 60% stenosed.   Dist Graft lesion is 70% stenosed.   Dist LAD lesion is 30% stenosed.   Balloon angioplasty was performed.   Post intervention, there is a 25% residual stenosis.   LV end diastolic pressure is low. Acute coronary syndrome with transient ST segment elevation in the inferior and anterolateral leads secondary to high-grade stenosis in a stent in the proximal SVG supplying the distal RCA. The left main coronary artery is widely patent status post remote intervention.  The LAD has 30% smooth proximal stenosis and 30% distal stenosis.  There is antegrade flow down the diagonal and LAD without competitive filling. The ramus intermediate vessel has smooth 40% proximal stenosis. Left circumflex vessel has eccentric 75% stenosis on a bend in the vessel. The native RCA has a previously placed stent region of the acute margin and it appears that the distal RCA does not feel from the segment. The SVG supplying the distal RCA is patent but diffusely diseased.  There appears to be a stent in the very proximal portion of the graft with focal 85 to 90% stenosis just at the beginning of this stent.  The RCA graft has diffuse 40 6070% stenoses throughout.  The vessel fills the distal RCA PDA and PLA branches. Atretic LIMA graft which had supplied the LAD. Occluded vein graft which had supplied the circumflex vessel. Occluded vein graft which had supplied the diagonal vessel. LVEDP 6 mmHg. Successful percutaneous coronary intervention with PTCA in the very proximal portion of the previously placed SVG stent with the 85 to 90% stenosis being reduced to 25%. RECOMMENDATON: Continue DAPT indefinitely.  Apparently, Plavix had been held for several days due to the patient's recent lower GI bleed which was successfully treated with coil embolization by interventional radiology.  Aggressive lipid management with  target LDL < 55.    Cardiac Studies   2D echocardiogram 03/29/2022: 1. Left ventricular ejection fraction, by estimation, is 50 to  55%. The  left ventricle has low normal function. The left ventricle demonstrates  regional wall motion abnormalities (see scoring diagram/findings for  description). Left ventricular diastolic   parameters were normal.   2. Right ventricular systolic function is normal. The right ventricular  size is normal.   3. The mitral valve is normal in structure. Mild mitral valve  regurgitation. No evidence of mitral stenosis.   4. The aortic valve is tricuspid. Aortic valve regurgitation is not  visualized. No aortic stenosis is present.   5. The inferior vena cava is normal in size with greater than 50%  respiratory variability, suggesting right atrial pressure of 3 mmHg.   Cath 05/08/2022 Coronary Findings  Diagnostic Dominance: Right Left Anterior Descending Ost LAD to Prox LAD lesion is 30% stenosed. Dist LAD lesion is 30% stenosed.  Ramus Intermedius Ramus lesion is 40% stenosed.  Left Circumflex Prox Cx to Mid Cx lesion is 75% stenosed.  Right Coronary Artery Mid RCA lesion is 70% stenosed. The lesion was previously treated .  Graft To Dist RCA Origin lesion is 85% stenosed. The lesion was previously treated . Prox Graft to Mid Graft lesion is 40% stenosed. Mid Graft lesion is 60% stenosed. Dist Graft lesion is 70% stenosed.  Intervention  Origin lesion (Graft To Dist RCA) Angioplasty Balloon angioplasty was performed. Post-Intervention Lesion Assessment The intervention was successful. Pre-interventional TIMI flow is 3. Post-intervention TIMI flow is 3. There is a 25% residual stenosis post intervention.   Left Heart  Left Ventricle LV end diastolic pressure is low. LVEDP 6 mm Hg.  Diagnostic Dominance: Right  Intervention    Patient Profile     77 y.o. female  with a hx of CAD and prior MI who is being seen 05/08/2022 for  the evaluation of STEMI after coil embolization treatment for acute lower GI bleed on 05/05/2022.  Assessment & Plan    Acute inferior and anterolateral STEMI: She has a remote history of multiple MIs and ICD implantation.  In 2008 she presented with STEMI and ventricular fibrillation cardiac arrest.  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  She had a remarkably good recovery and has been maintained on long-term DAPT with aspirin and clopidogrel. On 05/05/2022 she presented with painless hematochezia and is found to have a lower gastrointestinal bleed treated with coil embolization with good treatment response. 05/08/2022 morning she developed acute chest pain and EKG changes diagnostic of acute STEMI. Underwent emergency cath and PCI with balloon angioplasty of graft to distal RCA. She was off antiplatelets for 3 days and was loaded with clopidogrel 600 mg before doing her cath.  Doing well post procedure, no return of chest pain. Telemetry reviewed, in NSR with occasional PVCs, no NSVT. R femoral access without abnormality.  hsTrop 2614->1187 Asa/plavix indefinitely, imdur 60 mg bid, high intensity statin She has been on ranolazine 500 mg bid outpatient (switched to imdur due to insurance coverage), not given this hospitalization  Ok to discharge from a cardiac standpoint with close follow up. Cardiac rehab.  Lower GI bleed: Hgb 10.6->9.6, expected post procedure and after IVF. Will continue to monitor with primary as we have restarted DAPT. If no signs of bleeding we are comfortable for discharge today, we will coordinate follow up with Dr. Irish Lack. Mixed hyperlipidemia: Changed from atorvastatin to rosuvastatin 20 mg daily because of myalgias in November 2023.  Will check follow-up lipids. Recommend discharge on rosuvastatin 20 mg  daily Tobacco abuse: Will continue to revisit cessation counseling.  She has had extensive outpatient  counseling. She is motivated to quit after this event.     For questions or updates, please contact [place] Please consult [email or web address] for contact info under   Medication Recommendations:  Aspirin 81 mg daily, clopidogrel 75 mg daily, isosorbide mononitrate 60 mg BID, rosuvastatin 20 mg daily Other recommendations (labs, testing, etc):  lipid panel (if not drawn here), CBC, BMP Follow up as an outpatient:  With Dr. Irish Lack.      SignedJohny Blamer, DO  05/09/2022, 9:13 AM   Internal Medicine Resident, PGY-1 Pager# [phone]  Patient seen, examined. Available data reviewed. Agree with findings, assessment, and plan as outlined by Dr Nikki Dom.  The patient is independently interviewed and examined.  She is alert, oriented, in no distress.  HEENT is normal, lungs are clear bilaterally, heart is regular rate and rhythm no murmur gallop, abdomen soft nontender, extremities have no edema, right groin site is tender to touch but there is no hematoma, mass, or ecchymosis.  Cardiac catheterization films are reviewed.  The patient did well with PTCA of the saphenous vein graft RCA distribution.  She is tolerating DAPT with aspirin and clopidogrel.  She will continue on medical therapy for residual CAD.  I emphasized the importance of tobacco cessation.  Plans noted to keep her another 24 hours and recheck her hemoglobin tomorrow.  If hemoglobin is stable, she is likely to be discharged home.  Will follow-up tomorrow.  Sherren Mocha, M.D. 05/09/2022 11:29 AM

## 2022-05-09 NOTE — Progress Notes (Signed)
[facility] PROGRESS NOTE   Karen [name] UQJ:[id number] DOB: May 10, 1945 DOA: 05/05/2022  PCP: [facility]  Brief History/Interval Summary: Karen [name] is a 77 y.o. 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 Hemoglobin down from normal 12.9 to 10g/dl and subsequently 8.8 with active bleeding identified on CT angiogram. Underwent embolization of the distal left colic/sigmoid branch artery with resolution of bleeding. Hemoglobin stabilized. Developed chest pain and ECG changes consistent with STEMI on the morning of 1/10.  Underwent PCI.  Consultants: Gastroenterology.  Interventional radiology.  Cardiology.  Procedures:   Status post distal left colic/sigmoid artery branch vessel embolization by interventional radiology on 1/8  Cardiac catheterization 1/10 Successful percutaneous coronary intervention with PTCA in the very proximal portion of the previously placed SVG stent with the 85 to 90% stenosis being reduced to 25%.   Subjective/Interval History: Patient feels lethargic but denies any chest pain or shortness of breath this morning.    Assessment/Plan:  STEMI in the setting of known history of coronary artery disease status post CABG previously On 1/10, the patient developed severe substernal chest pain associated with dyspnea, abrupt bradycardia with occasional VT on telemetry and ECG consistent with ACS/STEMI.  Patient urgently seen by cardiology.  Code STEMI was called.  Patient underwent cardiac catheterization with intervention on 1/10. Further management per cardiology.  Noted to be on aspirin and Plavix.  Isosorbide mononitrate.   Diverticular GI bleed Seen by gastroenterology.  Underwent distal left colic/sigmoid artery branch vessel embolization by Dr. Maryelizabeth [name] with IR. Passed a small amount of blood in the stool  which is likely old blood.  Hemoglobin stable for the most part.  Now on dual antiplatelet treatment as mentioned above.    Acute blood loss anemia secondary to GI bleed See above.  She was transfused 1 unit of PRBC on 1/7.  Slight drop in hemoglobin noted this morning.  Recheck labs tomorrow.   Thrombocytopenia Suspected to be new, but is stabilized >100k.   History of CHF with recovered EF, HTN: LVEF 50-55%.  Stable.  Euvolemic.   Hx VF arrest s/p AICD Monitor electrolytes.   Hypokalemia Will give a dose of potassium today.   COPD: Quiescent.  Continue home meds   History of left lung CA s/p SBRT 2022.  Stable   Stage IIIa/b CKD Based on baseline CrCl 45.62ml/min.    Hyperlipidemia Noted to be on atorvastatin.  Check lipid panel.   Vitamin B12 deficiency Marginal B12 level and macrocytosis noted.  Supplemented IM while admitted, continue po supplement   Tobacco use:  - Cessation counseling provided - Nicotine patch while admitted   GERD, chronic dysphagia:  - Continue with plans for EGD with [facility].    DVT Prophylaxis: SCDs Code Status: Full code Family Communication: Discussed with patient Disposition Plan: Hopefully discharge home in the next 1 to 2 days  Status is: Inpatient Remains inpatient appropriate because: STEMI, GI bleed     Medications: Scheduled:  arformoterol  15 mcg Nebulization BID   And   umeclidinium bromide  1 puff Inhalation Daily   aspirin  81 mg Oral Daily   atorvastatin  80 mg Oral Daily   clopidogrel  75 mg Oral Q breakfast   cyanocobalamin  1,000 mcg Intramuscular Daily   furosemide  20 mg Oral Daily   isosorbide mononitrate  60 mg Oral BID   nicotine  21 mg Transdermal Daily   pantoprazole  40 mg Oral BID   potassium chloride  20 mEq Oral Once   sodium chloride flush  3 mL Intravenous Q12H   sucralfate  1 g Oral BID   Continuous:  sodium chloride     UYQ:IHKVQQ chloride, acetaminophen, acetaminophen,  albuterol, diazepam, nitroGLYCERIN, oxyCODONE-acetaminophen, sodium chloride flush  Antibiotics: Anti-infectives (From admission, onward)    Start     Dose/Rate Route Frequency Ordered Stop   05/05/22 2236  ceFAZolin (ANCEF) IVPB 2g/100 mL premix  Status:  Discontinued        over 30 Minutes Intravenous Continuous PRN 05/05/22 2237 05/06/22 1734       Objective:  Vital Signs  Vitals:   05/08/22 1946 05/08/22 2358 05/09/22 0402 05/09/22 0810  BP: 117/66 (!) 101/58 (!) 98/50 (!) 97/45  Pulse: 77 84 77 88  Resp: 17 15 19 17   Temp: 98.7 F (37.1 C) 98.4 F (36.9 C) 98.1 F (36.7 C) 98 F (36.7 C)  TempSrc: Oral Oral Oral Oral  SpO2: 98% 96% 100% 98%  Weight:      Height:        Intake/Output Summary (Last 24 hours) at 05/09/2022 0913 Last data filed at 05/09/2022 0814 Gross per 24 hour  Intake 1448.67 ml  Output 1300 ml  Net 148.67 ml   Filed Weights   05/06/22 2055  Weight: 70 kg    General appearance: Awake alert.  In no distress Resp: Clear to auscultation bilaterally.  Normal effort Cardio: S1-S2 is normal regular.  No S3-S4.  No rubs murmurs or bruit GI: Abdomen is soft.  Nontender nondistended.  Bowel sounds are present normal.  No masses organomegaly Extremities: No edema.  Neurologic: Alert and oriented x3.  No focal neurological deficits.    Lab Results:  Data Reviewed: I have personally reviewed following labs and reports of the imaging studies  CBC: Recent Labs  Lab 05/05/22 1354 05/05/22 2201 05/06/22 1500 05/06/22 2138 05/07/22 0048 05/08/22 0552 05/09/22 0617  WBC 4.8   < > 6.9 7.6 5.9 4.9 4.3  NEUTROABS 2.7  --   --   --   --   --   --   HGB 10.0*   < > 10.2* 10.7* 10.0* 10.6* 9.6*  HCT 32.0*   < > 30.6* 31.5* 30.7* 32.0* 28.5*  MCV 100.9*   < > 96.2 94.3 98.1 96.1 97.9  PLT 146*   < > 108* 129* 124* 119* 125*   < > = values in this interval not displayed.    Basic Metabolic Panel: Recent Labs  Lab 05/05/22 1354 05/06/22 0317  05/07/22 0048 05/08/22 0552 05/09/22 0617  NA 140 136 137 139 137  K 3.8 4.0 3.2* 3.4* 3.6  CL 106 111 107 107 107  CO2 26 18* 21* 24 22  GLUCOSE 111* 103* 100* 116* 110*  BUN 12 17 11 11 11   CREATININE 1.20* 1.12* 0.98 0.94 0.93  CALCIUM 8.6* 8.2* 8.2* 8.7* 8.1*  MG  --  1.7 2.1  --   --   PHOS  --  3.4  --   --   --     GFR: Estimated Creatinine Clearance: 48.3 mL/min (by C-G formula based on SCr of 0.93 mg/dL).  Liver Function Tests: Recent Labs  Lab 05/05/22 1354 05/06/22 0317  AST 16 14*  ALT 13 10  ALKPHOS 55 52  BILITOT 0.6 0.8  PROT 5.3*  4.8*  ALBUMIN 3.2* 3.1*    Recent Labs  Lab 05/05/22 1354  LIPASE 20     Recent Results (from the past 240 hour(s))  Resp panel by RT-PCR (RSV, Flu A&B, Covid) Anterior Nasal Swab     Status: None   Collection Time: 05/05/22  1:54 PM   Specimen: Anterior Nasal Swab  Result Value Ref Range Status   SARS Coronavirus 2 by RT PCR NEGATIVE NEGATIVE Final    Comment: (NOTE) SARS-CoV-2 target nucleic acids are NOT DETECTED.  The SARS-CoV-2 RNA is generally detectable in upper respiratory specimens during the acute phase of infection. The lowest concentration of SARS-CoV-2 viral copies this assay can detect is 138 copies/mL. A negative result does not preclude SARS-Cov-2 infection and should not be used as the sole basis for treatment or other patient management decisions. A negative result may occur with  improper specimen collection/handling, submission of specimen other than nasopharyngeal swab, presence of viral mutation(s) within the areas targeted by this assay, and inadequate number of viral copies(<138 copies/mL). A negative result must be combined with clinical observations, patient history, and epidemiological information. The expected result is Negative.  Fact Sheet for Patients:  EntrepreneurPulse.com.au  Fact Sheet for Healthcare Providers:   IncredibleEmployment.be  This test is no t yet approved or cleared by the [place] FDA and  has been authorized for detection and/or diagnosis of SARS-CoV-2 by FDA under an Emergency Use Authorization (EUA). This EUA will remain  in effect (meaning this test can be used) for the duration of the COVID-19 declaration under Section 564(b)(1) of the Act, 21 U.S.C.section 360bbb-3(b)(1), unless the authorization is terminated  or revoked sooner.       Influenza A by PCR NEGATIVE NEGATIVE Final   Influenza B by PCR NEGATIVE NEGATIVE Final    Comment: (NOTE) The Xpert Xpress SARS-CoV-2/FLU/RSV plus assay is intended as an aid in the diagnosis of influenza from Nasopharyngeal swab specimens and should not be used as a sole basis for treatment. Nasal washings and aspirates are unacceptable for Xpert Xpress SARS-CoV-2/FLU/RSV testing.  Fact Sheet for Patients: EntrepreneurPulse.com.au  Fact Sheet for Healthcare Providers: IncredibleEmployment.be  This test is not yet approved or cleared by the [place] FDA and has been authorized for detection and/or diagnosis of SARS-CoV-2 by FDA under an Emergency Use Authorization (EUA). This EUA will remain in effect (meaning this test can be used) for the duration of the COVID-19 declaration under Section 564(b)(1) of the Act, 21 U.S.C. section 360bbb-3(b)(1), unless the authorization is terminated or revoked.     Resp Syncytial Virus by PCR NEGATIVE NEGATIVE Final    Comment: (NOTE) Fact Sheet for Patients: EntrepreneurPulse.com.au  Fact Sheet for Healthcare Providers: IncredibleEmployment.be  This test is not yet approved or cleared by the [place] FDA and has been authorized for detection and/or diagnosis of SARS-CoV-2 by FDA under an Emergency Use Authorization (EUA). This EUA will remain in effect (meaning this test can be used) for  the duration of the COVID-19 declaration under Section 564(b)(1) of the Act, 21 U.S.C. section 360bbb-3(b)(1), unless the authorization is terminated or revoked.  Performed at [facility], [place] [address], [place], [place] [address]   MRSA Next Gen by PCR, Nasal     Status: None   Collection Time: 05/06/22  9:00 PM   Specimen: Nasal Mucosa; Nasal Swab  Result Value Ref Range Status   MRSA by PCR Next Gen NOT DETECTED NOT DETECTED Final    Comment: (NOTE)  The GeneXpert MRSA Assay (FDA approved for NASAL specimens only), is one component of a comprehensive MRSA colonization surveillance program. It is not intended to diagnose MRSA infection nor to guide or monitor treatment for MRSA infections. Test performance is not FDA approved in patients less than 29 years old. Performed at [facility], [place] [address], [place], [place] [address]       Radiology Studies: CARDIAC CATHETERIZATION  Result Date: 05/08/2022   Ost LAD to Prox LAD lesion is 30% stenosed.   Ramus lesion is 40% stenosed.   Prox Cx to Mid Cx lesion is 75% stenosed.   Mid RCA lesion is 70% stenosed.   Origin lesion is 85% stenosed.   Prox Graft to Mid Graft lesion is 40% stenosed.   Mid Graft lesion is 60% stenosed.   Dist Graft lesion is 70% stenosed.   Dist LAD lesion is 30% stenosed.   Balloon angioplasty was performed.   Post intervention, there is a 25% residual stenosis.   LV end diastolic pressure is low. Acute coronary syndrome with transient ST segment elevation in the inferior and anterolateral leads secondary to high-grade stenosis in a stent in the proximal SVG supplying the distal RCA. The left main coronary artery is widely patent status post remote intervention.  The LAD has 30% smooth proximal stenosis and 30% distal stenosis.  There is antegrade flow down the diagonal and LAD without competitive filling. The ramus intermediate vessel has smooth 40% proximal stenosis. Left circumflex vessel has  eccentric 75% stenosis on a bend in the vessel. The native RCA has a previously placed stent region of the acute margin and it appears that the distal RCA does not feel from the segment. The SVG supplying the distal RCA is patent but diffusely diseased.  There appears to be a stent in the very proximal portion of the graft with focal 85 to 90% stenosis just at the beginning of this stent.  The RCA graft has diffuse 40 6070% stenoses throughout.  The vessel fills the distal RCA PDA and PLA branches. Atretic LIMA graft which had supplied the LAD. Occluded vein graft which had supplied the circumflex vessel. Occluded vein graft which had supplied the diagonal vessel. LVEDP 6 mmHg. Successful percutaneous coronary intervention with PTCA in the very proximal portion of the previously placed SVG stent with the 85 to 90% stenosis being reduced to 25%. RECOMMENDATON: Continue DAPT indefinitely.  Apparently, Plavix had been held for several days due to the patient's recent lower GI bleed which was successfully treated with coil embolization by interventional radiology.  Aggressive lipid management with target LDL < 55.       LOS: 4 days   Latrish Mogel [employer] on [email or web address]  05/09/2022, 9:13 AM

## 2022-05-09 NOTE — Discharge Instructions (Signed)

## 2022-05-10 ENCOUNTER — Encounter (HOSPITAL_COMMUNITY): Payer: Self-pay

## 2022-05-10 ENCOUNTER — Other Ambulatory Visit (HOSPITAL_COMMUNITY): Payer: Medicare Other

## 2022-05-10 LAB — BASIC METABOLIC PANEL
Anion gap: 8 (ref 5–15)
BUN: 14 mg/dL (ref 8–23)
CO2: 21 mmol/L — ABNORMAL LOW (ref 22–32)
Calcium: 7.9 mg/dL — ABNORMAL LOW (ref 8.9–10.3)
Chloride: 107 mmol/L (ref 98–111)
Creatinine, Ser: 0.98 mg/dL (ref 0.44–1.00)
GFR, Estimated: 60 mL/min — ABNORMAL LOW (ref >60)
Glucose, Bld: 151 mg/dL — ABNORMAL HIGH (ref 70–99)
Potassium: 3.7 mmol/L (ref 3.5–5.1)
Sodium: 136 mmol/L (ref 135–145)

## 2022-05-10 LAB — MAGNESIUM: Magnesium: 1.8 mg/dL (ref 1.7–2.4)

## 2022-05-10 LAB — HEMOGLOBIN AND HEMATOCRIT, BLOOD
HCT: 27.7 % — ABNORMAL LOW (ref 36.0–46.0)
Hemoglobin: 9.4 g/dL — ABNORMAL LOW (ref 12.0–15.0)

## 2022-05-10 LAB — LIPID PANEL
Cholesterol: 91 mg/dL (ref 0–200)
HDL: 36 mg/dL — ABNORMAL LOW (ref >40)
LDL Cholesterol: 45 mg/dL (ref 0–99)
Total CHOL/HDL Ratio: 2.5 RATIO
Triglycerides: 48 mg/dL (ref <150)
VLDL: 10 mg/dL (ref 0–40)

## 2022-05-10 LAB — LIPOPROTEIN A (LPA): Lipoprotein (a): 210.6 nmol/L — ABNORMAL HIGH (ref <75.0)

## 2022-05-10 LAB — CBC
HCT: 26.3 % — ABNORMAL LOW (ref 36.0–46.0)
Hemoglobin: 8.6 g/dL — ABNORMAL LOW (ref 12.0–15.0)
MCH: 31.6 pg (ref 26.0–34.0)
MCHC: 32.7 g/dL (ref 30.0–36.0)
MCV: 96.7 fL (ref 80.0–100.0)
Platelets: 124 10*3/uL — ABNORMAL LOW (ref 150–400)
RBC: 2.72 MIL/uL — ABNORMAL LOW (ref 3.87–5.11)
RDW: 12.6 % (ref 11.5–15.5)
WBC: 4.4 10*3/uL (ref 4.0–10.5)
nRBC: 0 % (ref 0.0–0.2)

## 2022-05-10 MED ORDER — AMLODIPINE BESYLATE 2.5 MG PO TABS
2.5000 mg | ORAL_TABLET | Freq: Every day | ORAL | Status: DC
  Administered 2022-05-10 – 2022-05-16 (×7): 2.5 mg via ORAL
  Filled 2022-05-10 (×7): qty 1

## 2022-05-10 MED ORDER — MAGNESIUM OXIDE -MG SUPPLEMENT 400 (240 MG) MG PO TABS
400.0000 mg | ORAL_TABLET | Freq: Once | ORAL | Status: AC
  Administered 2022-05-10: 400 mg via ORAL
  Filled 2022-05-10: qty 1

## 2022-05-10 MED ORDER — POTASSIUM CHLORIDE CRYS ER 20 MEQ PO TBCR
40.0000 meq | EXTENDED_RELEASE_TABLET | Freq: Once | ORAL | Status: AC
  Administered 2022-05-10: 40 meq via ORAL
  Filled 2022-05-10: qty 2

## 2022-05-10 NOTE — Progress Notes (Signed)
PROGRESS NOTE    Karen [name]  RHE:[id number] DOB: 1945/05/19 DOA: 05/05/2022 PCP: [facility]  Chief Complaint  Patient presents with   Abdominal Pain   Melena    Brief Narrative:  Karen [name] is Karen [name] 77 y.o. 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 Hemoglobin down from normal 12.9 to 10g/dl and subsequently 8.8 with active bleeding identified on CT angiogram. Underwent embolization of the distal left colic/sigmoid branch artery with resolution of bleeding. Hemoglobin stabilized. Developed chest pain and ECG changes consistent with STEMI on the morning of 1/10.  Underwent PCI.   Assessment & Plan:   Principal Problem:   Lower GI bleed Active Problems:   Adenocarcinoma, lung (HCC)   Tobacco use   Essential hypertension   Coronary artery disease involving native heart with angina pectoris (HCC)   VENTRICULAR FIBRILLATION   Stage 2 moderate COPD by GOLD classification (HCC)   Chest pain   Prolonged QT interval   Acute ST elevation myocardial infarction (STEMI) of inferolateral wall (HCC)  STEMI in the setting of known history of coronary artery disease status post CABG previously On 1/10, the patient developed severe substernal chest pain associated with dyspnea, abrupt bradycardia with occasional VT on telemetry and ECG consistent with ACS/STEMI.  Patient underwent cardiac catheterization with intervention on 1/10.  S/p PCI with PTCA in very proximal portion of the previously placed SVG stent with 85-90% stenosis being reduced to 25%.  Recommending DAPT indefinitely. Had CP again today, per cardiology.  EKG with T wave inversions in II, III, aVF and V3-6.  Similar to yesterday's EKG.  Continue DAPT, imdur 60 mg BID, high intensity statin.   Diverticular GI bleed Seen by gastroenterology.  Underwent distal left colic/sigmoid artery branch  vessel embolization by Dr. Milford Cage with IR. Passed Karen [name] small amount of blood in the stool which is likely old blood.  Hemoglobin stable for the most part.  Now on dual antiplatelet treatment as mentioned above.     Acute blood loss anemia secondary to GI bleed See above.  She was transfused 1 unit of PRBC on 1/7.   Hb fluctuating Will continue to trend on DAPT   Thrombocytopenia Suspected to be new, but is stabilized >100k.    History of CHF with recovered EF, HTN: LVEF 50-55%.  Stable.  Euvolemic.   Hx VF arrest s/p AICD Monitor electrolytes.   Hypokalemia Replace and follow   COPD: Quiescent.  Continue home meds   History of left lung CA s/p SBRT 2022.  Stable   Stage IIIa/b CKD Based on baseline CrCl 45.4ml/min.    Hyperlipidemia Noted to be on atorvastatin.  Check lipid panel.  LDL 45.   Vitamin B12 deficiency Marginal B12 level and macrocytosis noted.  Supplemented IM while admitted, continue po supplement   Tobacco use:  - Cessation counseling provided - Nicotine patch while admitted   GERD, chronic dysphagia:  - Continue with plans for EGD with [facility].     DVT prophylaxis: SCD Code Status: full Family Communication: none Disposition:   Status is: Inpatient Remains inpatient appropriate because: awaiting cardiology clearance, stability of Hb   Consultants:  Cardiology IR GI  Procedures:  LHC  Ost LAD to Prox LAD lesion is 30% stenosed.   Ramus lesion is 40% stenosed.   Prox Cx to Mid Cx lesion  is 75% stenosed.   Mid RCA lesion is 70% stenosed.   Origin lesion is 85% stenosed.   Prox Graft to Mid Graft lesion is 40% stenosed.   Mid Graft lesion is 60% stenosed.   Dist Graft lesion is 70% stenosed.   Dist LAD lesion is 30% stenosed.   Balloon angioplasty was performed.   Post intervention, there is Karen [name] 25% residual stenosis.   LV end diastolic pressure is low.   Acute coronary syndrome with transient ST segment elevation in the  inferior and anterolateral leads secondary to high-grade stenosis in Karen [name] stent in the proximal SVG supplying the distal RCA.   The left main coronary artery is widely patent status post remote intervention.  The LAD has 30% smooth proximal stenosis and 30% distal stenosis.  There is antegrade flow down the diagonal and LAD without competitive filling.   The ramus intermediate vessel has smooth 40% proximal stenosis.   Left circumflex vessel has eccentric 75% stenosis on Karen [name] bend in the vessel.   The native RCA has Karen [name] previously placed stent region of the acute margin and it appears that the distal RCA does not feel from the segment.   The SVG supplying the distal RCA is patent but diffusely diseased.  There appears to be Karen [name] stent in the very proximal portion of the graft with focal 85 to 90% stenosis just at the beginning of this stent.  The RCA graft has diffuse 40 6070% stenoses throughout.  The vessel fills the distal RCA PDA and PLA branches.   Atretic LIMA graft which had supplied the LAD.   Occluded vein graft which had supplied the circumflex vessel.   Occluded vein graft which had supplied the diagonal vessel.   LVEDP 6 mmHg.   Successful percutaneous coronary intervention with PTCA in the very proximal portion of the previously placed SVG stent with the 85 to 90% stenosis being reduced to 25%.   RECOMMENDATON: Continue DAPT indefinitely.  Apparently, Plavix had been held for several days due to the patient's recent lower GI bleed which was successfully treated with coil embolization by interventional radiology.  Aggressive lipid management with target LDL < 55.  Antimicrobials:  Anti-infectives (From admission, onward)    Start     Dose/Rate Route Frequency Ordered Stop   05/05/22 2236  ceFAZolin (ANCEF) IVPB 2g/100 mL premix  Status:  Discontinued        over 30 Minutes Intravenous Continuous PRN 05/05/22 2237 05/06/22 1734       Subjective: Notes CP this AM around  breakfast No recent BM (since wed)  Objective: Vitals:   05/10/22 1056 05/10/22 1057 05/10/22 1143 05/10/22 1529  BP:   (!) 112/59 (!) 102/58  Pulse:   95 (!) 101  Resp:   20 (!) 24  Temp:   98.1 F (36.7 C) 98.4 F (36.9 C)  TempSrc:   Oral Oral  SpO2: 98% 99% 100% 98%  Weight:      Height:        Intake/Output Summary (Last 24 hours) at 05/10/2022 1541 Last data filed at 05/10/2022 1530 Gross per 24 hour  Intake 400 ml  Output 1100 ml  Net -700 ml   Filed Weights   05/06/22 2055  Weight: 70 kg    Examination:  General exam: Appears calm and comfortable  Respiratory system: unlabored Cardiovascular system: RRR Gastrointestinal system: Abdomen is nondistended, soft and nontender Central nervous system: Alert and oriented. No focal neurological deficits. Extremities: no LEE  Data  Reviewed: I have personally reviewed following labs and imaging studies  CBC: Recent Labs  Lab 05/05/22 1354 05/05/22 2201 05/06/22 2138 05/07/22 0048 05/08/22 0552 05/09/22 0617 05/10/22 0038 05/10/22 1444  WBC 4.8   < > 7.6 5.9 4.9 4.3 4.4  --   NEUTROABS 2.7  --   --   --   --   --   --   --   HGB 10.0*   < > 10.7* 10.0* 10.6* 9.6* 8.6* 9.4*  HCT 32.0*   < > 31.5* 30.7* 32.0* 28.5* 26.3* 27.7*  MCV 100.9*   < > 94.3 98.1 96.1 97.9 96.7  --   PLT 146*   < > 129* 124* 119* 125* 124*  --    < > = values in this interval not displayed.    Basic Metabolic Panel: Recent Labs  Lab 05/06/22 0317 05/07/22 0048 05/08/22 0552 05/09/22 0617 05/10/22 0038  NA 136 137 139 137 136  K 4.0 3.2* 3.4* 3.6 3.7  CL 111 107 107 107 107  CO2 18* 21* 24 22 21*  GLUCOSE 103* 100* 116* 110* 151*  BUN 17 11 11 11 14   CREATININE 1.12* 0.98 0.94 0.93 0.98  CALCIUM 8.2* 8.2* 8.7* 8.1* 7.9*  MG 1.7 2.1  --   --  1.8  PHOS 3.4  --   --   --   --     GFR: Estimated Creatinine Clearance: 45.8 mL/min (by C-G formula based on SCr of 0.98 mg/dL).  Liver Function Tests: Recent Labs  Lab  05/05/22 1354 05/06/22 0317  AST 16 14*  ALT 13 10  ALKPHOS 55 52  BILITOT 0.6 0.8  PROT 5.3* 4.8*  ALBUMIN 3.2* 3.1*    CBG: No results for input(s): "GLUCAP" in the last 168 hours.   Recent Results (from the past 240 hour(s))  Resp panel by RT-PCR (RSV, Flu Kenna Seward&B, Covid) Anterior Nasal Swab     Status: None   Collection Time: 05/05/22  1:54 PM   Specimen: Anterior Nasal Swab  Result Value Ref Range Status   SARS Coronavirus 2 by RT PCR NEGATIVE NEGATIVE Final    Comment: (NOTE) SARS-CoV-2 target nucleic acids are NOT DETECTED.  The SARS-CoV-2 RNA is generally detectable in upper respiratory specimens during the acute phase of infection. The lowest concentration of SARS-CoV-2 viral copies this assay can detect is 138 copies/mL. Caterin Tabares negative result does not preclude SARS-Cov-2 infection and should not be used as the sole basis for treatment or other patient management decisions. Yasamin Karel negative result may occur with  improper specimen collection/handling, submission of specimen other than nasopharyngeal swab, presence of viral mutation(s) within the areas targeted by this assay, and inadequate number of viral copies(<138 copies/mL). Jammy Stlouis negative result must be combined with clinical observations, patient history, and epidemiological information. The expected result is Negative.  Fact Sheet for Patients:  07/04/22  Fact Sheet for Healthcare Providers:  BloggerCourse.com  This test is no t yet approved or cleared by the [employer] FDA and  has been authorized for detection and/or diagnosis of SARS-CoV-2 by FDA under an Emergency Use Authorization (EUA). This EUA will remain  in effect (meaning this test can be used) for the duration of the COVID-19 declaration under Section 564(b)(1) of the Act, 21 U.S.C.section 360bbb-3(b)(1), unless the authorization is terminated  or revoked sooner.       Influenza Calem Cocozza by PCR  NEGATIVE NEGATIVE Final   Influenza B by PCR NEGATIVE NEGATIVE Final  Comment: (NOTE) The Xpert Xpress SARS-CoV-2/FLU/RSV plus assay is intended as an aid in the diagnosis of influenza from Nasopharyngeal swab specimens and should not be used as Anquinette Pierro sole basis for treatment. Nasal washings and aspirates are unacceptable for Xpert Xpress SARS-CoV-2/FLU/RSV testing.  Fact Sheet for Patients: BloggerCourse.com  Fact Sheet for Healthcare Providers: SeriousBroker.it  This test is not yet approved or cleared by the [place] FDA and has been authorized for detection and/or diagnosis of SARS-CoV-2 by FDA under an Emergency Use Authorization (EUA). This EUA will remain in effect (meaning this test can be used) for the duration of the COVID-19 declaration under Section 564(b)(1) of the Act, 21 U.S.C. section 360bbb-3(b)(1), unless the authorization is terminated or revoked.     Resp Syncytial Virus by PCR NEGATIVE NEGATIVE Final    Comment: (NOTE) Fact Sheet for Patients: BloggerCourse.com  Fact Sheet for Healthcare Providers: [employer]ious[employer]ker[employer]it  This test is not yet approved or cleared by the [place] FDA and has been authorized for detection and/or diagnosis of SARS-CoV-2 by FDA under an Emergency Use Authorization (EUA). This EUA will remain in effect (meaning this test can be used) for the duration of the COVID-19 declaration under Section 564(b)(1) of the Act, 21 U.S.C. section 360bbb-3(b)(1), unless the authorization is terminated or revoked.  Performed at [facility], [address], [place], [place] [address]   MRSA Next Gen by PCR, Nasal     Status: None   Collection Time: 05/06/22  9:00 PM   Specimen: Nasal Mucosa; Nasal Swab  Result Value Ref Range Status   MRSA by PCR Next Gen NOT DETECTED NOT DETECTED Final    Comment: (NOTE) The GeneXpert MRSA Assay  (FDA approved for NASAL specimens only), is one component of Shelina Luo comprehensive MRSA colonization surveillance program. It is not intended to diagnose MRSA infection nor to guide or monitor treatment for MRSA infections. Test performance is not FDA approved in patients less than 85 years old. Performed at [facility], [address] [place], [place] [address]          Radiology Studies: No results found.      Scheduled Meds:  amLODipine  2.5 mg Oral Daily   arformoterol  15 mcg Nebulization BID   And   umeclidinium bromide  1 puff Inhalation Daily   aspirin  81 mg Oral Daily   clopidogrel  75 mg Oral Q breakfast   cyanocobalamin  1,000 mcg Intramuscular Daily   furosemide  20 mg Oral Daily   isosorbide mononitrate  60 mg Oral BID   nicotine  21 mg Transdermal Daily   pantoprazole  40 mg Oral BID   rosuvastatin  20 mg Oral Daily   sodium chloride flush  3 mL Intravenous Q12H   sucralfate  1 g Oral BID   Continuous Infusions:  sodium chloride       LOS: 5 days    Time spent: over 30 min    Lacretia Nicks, MD [facility]   To contact the attending provider between 7A-7P or the covering provider during after hours 7P-7A, please log into the web site [email or web address] and access using universal [place] password for that web site. If you do not have the password, please call the hospital operator.  05/10/2022, 3:41 PM

## 2022-05-10 NOTE — Progress Notes (Signed)
Mobility Specialist Progress Note    05/10/22 1511  Mobility  Activity Ambulated with assistance in room  Level of Assistance Moderate assist, patient does 50-74%  Assistive Device Front wheel walker  Distance Ambulated (ft) 12 ft  Activity Response Tolerated fair  Mobility Referral Yes  $Mobility charge 1 Mobility   Pre-Mobility: 94 HR, 98% SpO2  Pt received in bed and agreeable to get to sink for wash up. C/o neck pain. Left at sink in chair w/ NT present.   [place] Mobility Specialist  Please Neurosurgeon or Rehab Office at [phone]

## 2022-05-10 NOTE — Progress Notes (Signed)
Came to see pt to offer assist with amb but pt reported that she experienced some angina this morning while eating. She reports that her angina is now gone and her doctor and nurse were made aware. Pt seems more lethargic today compared to yesterday when I saw her. I encouraged some light activity. Pt has call button in reach.   Faustino Congress 05/10/2022 10:42 AM  [id number]

## 2022-05-10 NOTE — Progress Notes (Addendum)
Rounding Note    Patient Name: Karen [name] Date of Encounter: 05/10/2022  [facility] Cardiologist: Lance Muss, MD Electrophysiologist:  Sherryl Manges, MD    Subjective   Pt had some mild heartburn and sweating this morning that was associated with eating and moving around and relieved by rest. No bowel movements or abdominal pain. No increased pain or new swelling in right lower extremity.   Inpatient Medications    Scheduled Meds:  arformoterol  15 mcg Nebulization BID   And   umeclidinium bromide  1 puff Inhalation Daily   aspirin  81 mg Oral Daily   clopidogrel  75 mg Oral Q breakfast   cyanocobalamin  1,000 mcg Intramuscular Daily   furosemide  20 mg Oral Daily   isosorbide mononitrate  60 mg Oral BID   nicotine  21 mg Transdermal Daily   pantoprazole  40 mg Oral BID   rosuvastatin  20 mg Oral Daily   sodium chloride flush  3 mL Intravenous Q12H   sucralfate  1 g Oral BID   Continuous Infusions:  sodium chloride     PRN Meds: sodium chloride, acetaminophen, acetaminophen, albuterol, diazepam, nitroGLYCERIN, oxyCODONE-acetaminophen, sodium chloride flush   Vital Signs    Vitals:   05/09/22 2125 05/09/22 2317 05/10/22 0348 05/10/22 0830  BP: 131/60 (!) 98/48 (!) 101/46 (!) 109/53  Pulse:    83  Resp:  14  19  Temp:  98.1 F (36.7 C) 97.7 F (36.5 C) 98.1 F (36.7 C)  TempSrc:  Oral Oral Oral  SpO2:  100%  98%  Weight:      Height:        Intake/Output Summary (Last 24 hours) at 05/10/2022 0834 Last data filed at 05/10/2022 0846 Gross per 24 hour  Intake 360 ml  Output 850 ml  Net -490 ml      05/06/2022    8:55 PM 04/23/2022    9:10 AM 03/28/2022   11:07 AM  Last 3 Weights  Weight (lbs) 154 lb 5.2 oz 153 lb 153 lb  Weight (kg) 70 kg 69.4 kg 69.4 kg      Telemetry    NSR with short episode of ST elevations including leads II, III, aVF - Personally Reviewed  ECG    05/09/2022 NSR, rate 78, flipped T waves in leads I, II,  V3-V6, stable from 05/08/2022 - Personally Reviewed  Physical Exam   GEN: No acute distress.   Neck: No JVD Cardiac: RRR, no murmurs, rubs, or gallops.  Respiratory: Mild wheezing diffusely, good air movement GI: Soft, nontender, non-distended  MS: No edema; No deformity. Right femoral access without edema or bleeding  Neuro:  Nonfocal  Psych: Normal affect   Labs    High Sensitivity Troponin:   Recent Labs  Lab 05/05/22 1354 05/06/22 0317 05/08/22 0552 05/08/22 1543 05/09/22 0617  TROPONINIHS <2 3 33* 2,614* 1,187*     Chemistry Recent Labs  Lab 05/05/22 1354 05/06/22 0317 05/07/22 0048 05/08/22 0552 05/09/22 0617 05/10/22 0038  NA 140 136 137 139 137 136  K 3.8 4.0 3.2* 3.4* 3.6 3.7  CL 106 111 107 107 107 107  CO2 26 18* 21* 24 22 21*  GLUCOSE 111* 103* 100* 116* 110* 151*  BUN 12 17 11 11 11 14   CREATININE 1.20* 1.12* 0.98 0.94 0.93 0.98  CALCIUM 8.6* 8.2* 8.2* 8.7* 8.1* 7.9*  MG  --  1.7 2.1  --   --  1.8  PROT 5.3*  4.8*  --   --   --   --   ALBUMIN 3.2* 3.1*  --   --   --   --   AST 16 14*  --   --   --   --   ALT 13 10  --   --   --   --   ALKPHOS 55 52  --   --   --   --   BILITOT 0.6 0.8  --   --   --   --   GFRNONAA 47* 51* 60* >60 >60 60*  ANIONGAP 8 7 9 8 8 8     Lipids  Recent Labs  Lab 05/10/22 0038  CHOL 91  TRIG 48  HDL 36*  LDLCALC 45  CHOLHDL 2.5    Hematology Recent Labs  Lab 05/08/22 0552 05/09/22 0617 05/10/22 0038  WBC 4.9 4.3 4.4  RBC 3.33* 2.91* 2.72*  HGB 10.6* 9.6* 8.6*  HCT 32.0* 28.5* 26.3*  MCV 96.1 97.9 96.7  MCH 31.8 33.0 31.6  MCHC 33.1 33.7 32.7  RDW 12.7 12.8 12.6  PLT 119* 125* 124*   Thyroid  Recent Labs  Lab 05/06/22 0317  TSH 1.004    BNPNo results for input(s): "BNP", "PROBNP" in the last 168 hours.  DDimer No results for input(s): "DDIMER" in the last 168 hours.   Radiology    CARDIAC CATHETERIZATION  Result Date: 05/08/2022   Ost LAD to Prox LAD lesion is 30% stenosed.   Ramus lesion is  40% stenosed.   Prox Cx to Mid Cx lesion is 75% stenosed.   Mid RCA lesion is 70% stenosed.   Origin lesion is 85% stenosed.   Prox Graft to Mid Graft lesion is 40% stenosed.   Mid Graft lesion is 60% stenosed.   Dist Graft lesion is 70% stenosed.   Dist LAD lesion is 30% stenosed.   Balloon angioplasty was performed.   Post intervention, there is a 25% residual stenosis.   LV end diastolic pressure is low. Acute coronary syndrome with transient ST segment elevation in the inferior and anterolateral leads secondary to high-grade stenosis in a stent in the proximal SVG supplying the distal RCA. The left main coronary artery is widely patent status post remote intervention.  The LAD has 30% smooth proximal stenosis and 30% distal stenosis.  There is antegrade flow down the diagonal and LAD without competitive filling. The ramus intermediate vessel has smooth 40% proximal stenosis. Left circumflex vessel has eccentric 75% stenosis on a bend in the vessel. The native RCA has a previously placed stent region of the acute margin and it appears that the distal RCA does not feel from the segment. The SVG supplying the distal RCA is patent but diffusely diseased.  There appears to be a stent in the very proximal portion of the graft with focal 85 to 90% stenosis just at the beginning of this stent.  The RCA graft has diffuse 40 6070% stenoses throughout.  The vessel fills the distal RCA PDA and PLA branches. Atretic LIMA graft which had supplied the LAD. Occluded vein graft which had supplied the circumflex vessel. Occluded vein graft which had supplied the diagonal vessel. LVEDP 6 mmHg. Successful percutaneous coronary intervention with PTCA in the very proximal portion of the previously placed SVG stent with the 85 to 90% stenosis being reduced to 25%. RECOMMENDATON: Continue DAPT indefinitely.  Apparently, Plavix had been held for several days due to the patient's recent lower GI bleed  which was successfully treated with  coil embolization by interventional radiology.  Aggressive lipid management with target LDL < 55.    Cardiac Studies    2D echocardiogram 03/29/2022: 1. Left ventricular ejection fraction, by estimation, is 50 to 55%. The  left ventricle has low normal function. The left ventricle demonstrates  regional wall motion abnormalities (see scoring diagram/findings for  description). Left ventricular diastolic   parameters were normal.   2. Right ventricular systolic function is normal. The right ventricular  size is normal.   3. The mitral valve is normal in structure. Mild mitral valve  regurgitation. No evidence of mitral stenosis.   4. The aortic valve is tricuspid. Aortic valve regurgitation is not  visualized. No aortic stenosis is present.   5. The inferior vena cava is normal in size with greater than 50%  respiratory variability, suggesting right atrial pressure of 3 mmHg.    Cath 05/08/2022 Coronary Findings   Diagnostic Dominance: Right Left Anterior Descending Ost LAD to Prox LAD lesion is 30% stenosed. Dist LAD lesion is 30% stenosed.   Ramus Intermedius Ramus lesion is 40% stenosed.   Left Circumflex Prox Cx to Mid Cx lesion is 75% stenosed.   Right Coronary Artery Mid RCA lesion is 70% stenosed. The lesion was previously treated .   Graft To Dist RCA Origin lesion is 85% stenosed. The lesion was previously treated . Prox Graft to Mid Graft lesion is 40% stenosed. Mid Graft lesion is 60% stenosed. Dist Graft lesion is 70% stenosed.   Intervention   Origin lesion (Graft To Dist RCA) Angioplasty Balloon angioplasty was performed. Post-Intervention Lesion Assessment The intervention was successful. Pre-interventional TIMI flow is 3. Post-intervention TIMI flow is 3. There is a 25% residual stenosis post intervention.     Left Heart   Left Ventricle LV end diastolic pressure is low. LVEDP 6 mm Hg.   Diagnostic Dominance: Right  Intervention      Patient Profile     77 y.o. female  with a hx of CAD and prior MI who is being seen 05/08/2022 for the evaluation of STEMI after coil embolization treatment for acute lower GI bleed on 05/05/2022.  Assessment & Plan    Acute inferior and anterolateral STEMI: She has a remote history of multiple MIs and ICD implantation.  In 2008 she presented with STEMI and ventricular fibrillation cardiac arrest.  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  She had a remarkably good recovery and has been maintained on long-term DAPT with aspirin and clopidogrel. On 05/05/2022 she presented with painless hematochezia and is found to have a lower gastrointestinal bleed treated with coil embolization with good treatment response. 05/08/2022 morning she developed acute chest pain and EKG changes diagnostic of acute STEMI. Underwent emergency cath and PCI with balloon angioplasty of graft to distal RCA. She was off antiplatelets for 3 days and was loaded with clopidogrel 600 mg before doing her cath.  Some return of chest pain associated with eating and moving around. Telemetry reviewed, in NSR with occasional PVCs and brief ST elevations early this morning. R femoral access without abnormality.  hsTrop [id number]->1187 yesterday Asa/plavix indefinitely, imdur 60 mg bid, high intensity statin Lower GI bleed: Hgb 10.6->9.6->8.6. No BM since prior to cath. No abnormalities on right femoral access. May have lagged following the procedure and will follow primary team's plan for repeat H&H at 1400. Mixed hyperlipidemia: rosuvastatin 20 mg daily. LDL  45, at goal. Tobacco abuse: Will continue to revisit cessation counseling.  She has had extensive outpatient counseling. She is motivated to quit after this event.     For questions or updates, please contact  [facility] Please consult [email or web address] for contact info under        Signed, Rocky Morel,  DO  05/10/2022, 8:34 AM   Internal Medicine Resident, PGY-1 Pager# [phone]  Patient seen, examined. Available data reviewed. Agree with findings, assessment, and plan as outlined by Dr Geraldo Pitter.  On my exam, the patient is alert, oriented, no distress.  HEENT is normal, JVP is normal, lungs are clear bilaterally, heart is regular rate and rhythm no murmur gallop, abdomen soft nontender, right groin site is tender to palpation with no hematoma or ecchymosis, lower extremities have no edema.  The patient's telemetry is reviewed and shows normal sinus rhythm.  However, there is a transient episode of ST segment elevation that was associated with chest pain.  This was not captured on twelve-lead EKG.  The patient has had no recurrence of symptoms.  She continues on aspirin, clopidogrel, rosuvastatin, isosorbide twice daily.  Will add amlodipine 2.5 mg daily, choosing his low-dose because of low normal blood pressure.  Recommend monitoring the patient on telemetry another 24 hours to rule out recurrent chest discomfort/ST segment change.  If she has recurrent symptoms, we will need to do a stat EKG.  Otherwise continue current medical therapy as outlined above.  Tonny Bollman, M.D. 05/10/2022 10:54 AM

## 2022-05-10 NOTE — Plan of Care (Signed)
  Problem: Education: Goal: Knowledge of General Education information will improve Description: Including pain rating scale, medication(s)/side effects and non-pharmacologic comfort measures Outcome: Progressing   Problem: Health Behavior/Discharge Planning: Goal: Ability to manage health-related needs will improve Outcome: Progressing   Problem: Clinical Measurements: Goal: Will remain free from infection Outcome: Progressing Goal: Diagnostic test results will improve Outcome: Progressing Goal: Respiratory complications will improve Outcome: Progressing Goal: Cardiovascular complication will be avoided Outcome: Progressing   Problem: Activity: Goal: Risk for activity intolerance will decrease Outcome: Progressing   Problem: Coping: Goal: Level of anxiety will decrease Outcome: Progressing   Problem: Elimination: Goal: Will not experience complications related to urinary retention Outcome: Progressing   Problem: Pain Managment: Goal: General experience of comfort will improve Outcome: Progressing   Problem: Safety: Goal: Ability to remain free from injury will improve Outcome: Progressing

## 2022-05-11 ENCOUNTER — Encounter (HOSPITAL_COMMUNITY): Payer: Self-pay | Admitting: Internal Medicine

## 2022-05-11 ENCOUNTER — Encounter (HOSPITAL_COMMUNITY)
Admission: EM | Disposition: A | Discharge status: Home Health | Payer: Self-pay | Source: Home / Self Care | Attending: Family Medicine

## 2022-05-11 DIAGNOSIS — I25810 Atherosclerosis of coronary artery bypass graft(s) without angina pectoris: Secondary | ICD-10-CM

## 2022-05-11 DIAGNOSIS — I2510 Atherosclerotic heart disease of native coronary artery without angina pectoris: Secondary | ICD-10-CM

## 2022-05-11 HISTORY — PX: LEFT HEART CATH AND CORS/GRAFTS ANGIOGRAPHY: CATH118250

## 2022-05-11 HISTORY — PX: CORONARY STENT INTERVENTION: CATH118234

## 2022-05-11 LAB — CBC WITH DIFFERENTIAL/PLATELET
Abs Immature Granulocytes: 0.01 10*3/uL (ref 0.00–0.07)
Basophils Absolute: 0 10*3/uL (ref 0.0–0.1)
Basophils Relative: 0 %
Eosinophils Absolute: 0.2 10*3/uL (ref 0.0–0.5)
Eosinophils Relative: 3 %
HCT: 27.6 % — ABNORMAL LOW (ref 36.0–46.0)
Hemoglobin: 9 g/dL — ABNORMAL LOW (ref 12.0–15.0)
Immature Granulocytes: 0 %
Lymphocytes Relative: 43 %
Lymphs Abs: 2.2 10*3/uL (ref 0.7–4.0)
MCH: 31.7 pg (ref 26.0–34.0)
MCHC: 32.6 g/dL (ref 30.0–36.0)
MCV: 97.2 fL (ref 80.0–100.0)
Monocytes Absolute: 0.3 10*3/uL (ref 0.1–1.0)
Monocytes Relative: 7 %
Neutro Abs: 2.5 10*3/uL (ref 1.7–7.7)
Neutrophils Relative %: 47 %
Platelets: 141 10*3/uL — ABNORMAL LOW (ref 150–400)
RBC: 2.84 MIL/uL — ABNORMAL LOW (ref 3.87–5.11)
RDW: 12.3 % (ref 11.5–15.5)
WBC: 5.2 10*3/uL (ref 4.0–10.5)
nRBC: 0 % (ref 0.0–0.2)

## 2022-05-11 LAB — MAGNESIUM: Magnesium: 1.8 mg/dL (ref 1.7–2.4)

## 2022-05-11 LAB — POCT ACTIVATED CLOTTING TIME
Activated Clotting Time: 223 seconds
Activated Clotting Time: 255 seconds

## 2022-05-11 LAB — COMPREHENSIVE METABOLIC PANEL
ALT: 11 U/L (ref 0–44)
AST: 18 U/L (ref 15–41)
Albumin: 2.7 g/dL — ABNORMAL LOW (ref 3.5–5.0)
Alkaline Phosphatase: 38 U/L (ref 38–126)
Anion gap: 8 (ref 5–15)
BUN: 15 mg/dL (ref 8–23)
CO2: 22 mmol/L (ref 22–32)
Calcium: 8.4 mg/dL — ABNORMAL LOW (ref 8.9–10.3)
Chloride: 107 mmol/L (ref 98–111)
Creatinine, Ser: 0.97 mg/dL (ref 0.44–1.00)
GFR, Estimated: >60 mL/min (ref >60)
Glucose, Bld: 162 mg/dL — ABNORMAL HIGH (ref 70–99)
Potassium: 3.9 mmol/L (ref 3.5–5.1)
Sodium: 137 mmol/L (ref 135–145)
Total Bilirubin: 0.4 mg/dL (ref 0.3–1.2)
Total Protein: 5.1 g/dL — ABNORMAL LOW (ref 6.5–8.1)

## 2022-05-11 LAB — TROPONIN I (HIGH SENSITIVITY)
Troponin I (High Sensitivity): 313 ng/L (ref <18)
Troponin I (High Sensitivity): 294 ng/L (ref <18)

## 2022-05-11 LAB — PHOSPHORUS: Phosphorus: 3.4 mg/dL (ref 2.5–4.6)

## 2022-05-11 SURGERY — CORONARY STENT INTERVENTION
Anesthesia: LOCAL

## 2022-05-11 MED ORDER — SODIUM CHLORIDE 0.9% FLUSH
3.0000 mL | Freq: Two times a day (BID) | INTRAVENOUS | Status: DC
  Administered 2022-05-11 – 2022-05-16 (×8): 3 mL via INTRAVENOUS

## 2022-05-11 MED ORDER — LIDOCAINE HCL (PF) 1 % IJ SOLN
INTRAMUSCULAR | Status: DC | PRN
  Administered 2022-05-11: 12 mL

## 2022-05-11 MED ORDER — MIDAZOLAM HCL 2 MG/2ML IJ SOLN
INTRAMUSCULAR | Status: DC | PRN
  Administered 2022-05-11: 1 mg via INTRAVENOUS

## 2022-05-11 MED ORDER — VERAPAMIL HCL 2.5 MG/ML IV SOLN
INTRAVENOUS | Status: AC
  Filled 2022-05-11: qty 2

## 2022-05-11 MED ORDER — HEPARIN SODIUM (PORCINE) 1000 UNIT/ML IJ SOLN
INTRAMUSCULAR | Status: DC | PRN
  Administered 2022-05-11: 6000 [IU] via INTRAVENOUS

## 2022-05-11 MED ORDER — SODIUM CHLORIDE 0.9% FLUSH: 3.0000 mL | INTRAVENOUS | Status: DC | PRN

## 2022-05-11 MED ORDER — SODIUM CHLORIDE 0.9 % WEIGHT BASED INFUSION: 1.0000 mL/kg/h | INTRAVENOUS | Status: AC

## 2022-05-11 MED ORDER — HEPARIN SODIUM (PORCINE) 1000 UNIT/ML IJ SOLN
INTRAMUSCULAR | Status: AC
  Filled 2022-05-11: qty 10

## 2022-05-11 MED ORDER — SODIUM CHLORIDE 0.9 % IV SOLN
INTRAVENOUS | Status: AC | PRN
  Administered 2022-05-11: 10 mL/h via INTRAVENOUS

## 2022-05-11 MED ORDER — HEPARIN (PORCINE) IN NACL 1000-0.9 UT/500ML-% IV SOLN
INTRAVENOUS | Status: AC
  Filled 2022-05-11: qty 1000

## 2022-05-11 MED ORDER — HEPARIN SODIUM (PORCINE) 1000 UNIT/ML IJ SOLN
INTRAMUSCULAR | Status: DC | PRN
  Administered 2022-05-11: 3000 [IU] via INTRAVENOUS

## 2022-05-11 MED ORDER — SODIUM CHLORIDE 0.9 % IV SOLN: 250.0000 mL | INTRAVENOUS | Status: DC | PRN

## 2022-05-11 MED ORDER — VERAPAMIL HCL 2.5 MG/ML IV SOLN
INTRAVENOUS | Status: DC | PRN
  Administered 2022-05-11: 200 ug via INTRACORONARY

## 2022-05-11 MED ORDER — MIDAZOLAM HCL 2 MG/2ML IJ SOLN
INTRAMUSCULAR | Status: AC
  Filled 2022-05-11: qty 2

## 2022-05-11 MED ORDER — IOHEXOL 350 MG/ML SOLN
INTRAVENOUS | Status: DC | PRN
  Administered 2022-05-11: 130 mL

## 2022-05-11 MED ORDER — HEPARIN (PORCINE) IN NACL 1000-0.9 UT/500ML-% IV SOLN
INTRAVENOUS | Status: DC | PRN
  Administered 2022-05-11 (×2): 500 mL

## 2022-05-11 MED ORDER — LIDOCAINE HCL (PF) 1 % IJ SOLN
INTRAMUSCULAR | Status: AC
  Filled 2022-05-11: qty 30

## 2022-05-11 MED ORDER — HYDRALAZINE HCL 20 MG/ML IJ SOLN: 10.0000 mg | INTRAMUSCULAR | Status: AC | PRN

## 2022-05-11 MED ORDER — FENTANYL CITRATE (PF) 100 MCG/2ML IJ SOLN
INTRAMUSCULAR | Status: DC | PRN
  Administered 2022-05-11: 25 ug via INTRAVENOUS

## 2022-05-11 MED ORDER — LABETALOL HCL 5 MG/ML IV SOLN: 10.0000 mg | INTRAVENOUS | Status: AC | PRN

## 2022-05-11 MED ORDER — FENTANYL CITRATE (PF) 100 MCG/2ML IJ SOLN
INTRAMUSCULAR | Status: AC
  Filled 2022-05-11: qty 2

## 2022-05-11 SURGICAL SUPPLY — 24 items
BALL SAPPHIRE NC24 2.50X12 (BALLOONS) ×1
BALLOON SAPPHIRE NC24 2.50X12 (BALLOONS) IMPLANT
CATH INFINITI 5 FR JL3.5 (CATHETERS) IMPLANT
CATH INFINITI 5 FR MPA2 (CATHETERS) IMPLANT
CATH INFINITI 5 FR RCB (CATHETERS) IMPLANT
CATHETER LAUNCHER 6FR RCB (CATHETERS) IMPLANT
CLOSURE PERCLOSE PROSTYLE (VASCULAR PRODUCTS) IMPLANT
ELECT DEFIB PAD ADLT CADENCE (PAD) IMPLANT
KIT ENCORE 26 ADVANTAGE (KITS) IMPLANT
KIT HEART LEFT (KITS) ×1 IMPLANT
KIT MICROPUNCTURE NIT STIFF (SHEATH) IMPLANT
PACK CARDIAC CATHETERIZATION (CUSTOM PROCEDURE TRAY) ×1 IMPLANT
SHEATH PINNACLE 6F 10CM (SHEATH) IMPLANT
STENT SYNERGY XD 3.0X16 (Permanent Stent) IMPLANT
STENT SYNERGY XD 3.0X24 (Permanent Stent) IMPLANT
STENT SYNERGY XD 3.0X32 (Permanent Stent) IMPLANT
SYNERGY XD 3.0X16 (Permanent Stent) ×1 IMPLANT
SYNERGY XD 3.0X24 (Permanent Stent) ×1 IMPLANT
TRANSDUCER W/STOPCOCK (MISCELLANEOUS) ×1 IMPLANT
TUBING CIL FLEX 10 FLL-RA (TUBING) ×1 IMPLANT
VALVE GUARDIAN II ~~LOC~~ HEMO (MISCELLANEOUS) IMPLANT
WIRE COUGAR XT STRL 190CM (WIRE) IMPLANT
WIRE EMERALD 3MM-J .035X150CM (WIRE) IMPLANT
WIRE MICRO SET SILHO 5FR 7 (SHEATH) IMPLANT

## 2022-05-11 NOTE — Progress Notes (Signed)
Changed femoral cath dressing. Dressing saturated with blood. Site still slowly oozing. Dressing changed and pressure held for 20 mins.  RLE warm and pulses present.  BP WNL. Patient tolerated well

## 2022-05-11 NOTE — Interval H&P Note (Signed)
History and Physical Interval Note:  05/11/2022 11:48 AM  Karen [name]  has presented today for surgery, with the diagnosis of STEMI.  The various methods of treatment have been discussed with the patient and family. After consideration of risks, benefits and other options for treatment, the patient has consented to  Procedure(s): Coronary/Graft Acute MI Revascularization (N/A) LEFT HEART CATH AND CORONARY ANGIOGRAPHY (N/A) as a surgical intervention.  The patient's history has been reviewed, patient examined, no change in status, stable for surgery.  I have reviewed the patient's chart and labs.  Questions were answered to the patient's satisfaction.    Pt well-known to me from taking care of her last week. She again had chest pain and ST elevation on tele today. Plan urgent re-look cath +/- PCI as outlined above. Pt evaluated in her room prior to coming to the cath lab. Chest pain free at present, hemodynamically stable. Cath films from last week's procedure reviewed. Pt/daughter's questions answered.   Tonny Bollman

## 2022-05-11 NOTE — Progress Notes (Signed)
Rounding Note    Patient Name: Karen [name] Date of Encounter: 05/11/2022  [facility] Cardiologist: Lance Muss, MD Electrophysiologist:  Sherryl Manges, MD    Subjective   More chest pain overnight, relieved with nitro. This morning, had chest and throat pain with transient ST elevation of 3 mm horizontal in the inferior leads - this resolved spontaneously, followed by T wave inversion and now normalization of her EKG. H/H has been stable at 9/27. BP remains soft in the 90's on amlodipine - no room for additional medical therapy.  Inpatient Medications    Scheduled Meds:  amLODipine  2.5 mg Oral Daily   arformoterol  15 mcg Nebulization BID   And   umeclidinium bromide  1 puff Inhalation Daily   aspirin  81 mg Oral Daily   clopidogrel  75 mg Oral Q breakfast   cyanocobalamin  1,000 mcg Intramuscular Daily   furosemide  20 mg Oral Daily   isosorbide mononitrate  60 mg Oral BID   nicotine  21 mg Transdermal Daily   pantoprazole  40 mg Oral BID   rosuvastatin  20 mg Oral Daily   sodium chloride flush  3 mL Intravenous Q12H   sucralfate  1 g Oral BID   Continuous Infusions:  sodium chloride     PRN Meds: sodium chloride, acetaminophen, acetaminophen, albuterol, diazepam, nitroGLYCERIN, oxyCODONE-acetaminophen, sodium chloride flush   Vital Signs    Vitals:   05/10/22 2345 05/11/22 0316 05/11/22 0722 05/11/22 0758  BP: (!) 103/49 (!) 93/53 108/75   Pulse: 81 74 71   Resp: 14 18 20    Temp: 98 F (36.7 C) 98 F (36.7 C) 97.9 F (36.6 C)   TempSrc: Oral Oral Oral   SpO2: 97% 98% 97% 99%  Weight:      Height:        Intake/Output Summary (Last 24 hours) at 05/11/2022 0747 Last data filed at 05/11/2022 05/13/2022 Gross per 24 hour  Intake 740 ml  Output 1950 ml  Net -1210 ml      05/06/2022    8:55 PM 04/23/2022    9:10 AM 03/28/2022   11:07 AM  Last 3 Weights  Weight (lbs) 154 lb 5.2 oz 153 lb 153 lb  Weight (kg) 70 kg 69.4 kg 69.4 kg       Telemetry    NSR with short episode of ST elevations including leads II, III, aVF - Personally Reviewed  ECG    N/A  Physical Exam   General appearance: alert and no distress Neck: no carotid bruit, no JVD, and thyroid not enlarged, symmetric, no tenderness/mass/nodules Lungs: clear to auscultation bilaterally Heart: regular rate and rhythm, S1, S2 normal, no murmur, click, rub or gallop Abdomen: soft, non-tender; bowel sounds normal; no masses,  no organomegaly Extremities: extremities normal, atraumatic, no cyanosis or edema Pulses: 2+ and symmetric Skin: Skin color, texture, turgor normal. No rashes or lesions Neurologic: Grossly normal Psych: Pleasant  Labs    High Sensitivity Troponin:   Recent Labs  Lab 05/05/22 1354 05/06/22 0317 05/08/22 0552 05/08/22 1543 05/09/22 0617  TROPONINIHS <2 3 33* 2,614* 1,187*     Chemistry Recent Labs  Lab 05/05/22 1354 05/05/22 1354 05/06/22 0317 05/07/22 0048 05/08/22 0552 05/09/22 0617 05/10/22 0038 05/11/22 0013  NA 140  --  136 137   < > 137 136 137  K 3.8  --  4.0 3.2*   < > 3.6 3.7 3.9  CL 106  --  111  107   < > 107 107 107  CO2 26  --  18* 21*   < > 22 21* 22  GLUCOSE 111*  --  103* 100*   < > 110* 151* 162*  BUN 12  --  17 11   < > 11 14 15   CREATININE 1.20*  --  1.12* 0.98   < > 0.93 0.98 0.97  CALCIUM 8.6*  --  8.2* 8.2*   < > 8.1* 7.9* 8.4*  MG  --    < > 1.7 2.1  --   --  1.8 1.8  PROT 5.3*  --  4.8*  --   --   --   --  5.1*  ALBUMIN 3.2*  --  3.1*  --   --   --   --  2.7*  AST 16  --  14*  --   --   --   --  18  ALT 13  --  10  --   --   --   --  11  ALKPHOS 55  --  52  --   --   --   --  38  BILITOT 0.6  --  0.8  --   --   --   --  0.4  GFRNONAA 47*  --  51* 60*   < > >60 60* >60  ANIONGAP 8  --  7 9   < > 8 8 8    < > = values in this interval not displayed.    Lipids  Recent Labs  Lab 05/10/22 0038  CHOL 91  TRIG 48  HDL 36*  LDLCALC 45  CHOLHDL 2.5    Hematology Recent Labs  Lab  05/09/22 0617 05/10/22 0038 05/10/22 1444 05/11/22 0013  WBC 4.3 4.4  --  5.2  RBC 2.91* 2.72*  --  2.84*  HGB 9.6* 8.6* 9.4* 9.0*  HCT 28.5* 26.3* 27.7* 27.6*  MCV 97.9 96.7  --  97.2  MCH 33.0 31.6  --  31.7  MCHC 33.7 32.7  --  32.6  RDW 12.8 12.6  --  12.3  PLT 125* 124*  --  141*   Thyroid  Recent Labs  Lab 05/06/22 0317  TSH 1.004    BNPNo results for input(s): "BNP", "PROBNP" in the last 168 hours.  DDimer No results for input(s): "DDIMER" in the last 168 hours.   Radiology    No results found.  Cardiac Studies    2D echocardiogram 03/29/2022: 1. Left ventricular ejection fraction, by estimation, is 50 to 55%. The  left ventricle has low normal function. The left ventricle demonstrates  regional wall motion abnormalities (see scoring diagram/findings for  description). Left ventricular diastolic   parameters were normal.   2. Right ventricular systolic function is normal. The right ventricular  size is normal.   3. The mitral valve is normal in structure. Mild mitral valve  regurgitation. No evidence of mitral stenosis.   4. The aortic valve is tricuspid. Aortic valve regurgitation is not  visualized. No aortic stenosis is present.   5. The inferior vena cava is normal in size with greater than 50%  respiratory variability, suggesting right atrial pressure of 3 mmHg.    Cath 05/08/2022 Coronary Findings   Diagnostic Dominance: Right Left Anterior Descending Ost LAD to Prox LAD lesion is 30% stenosed. Dist LAD lesion is 30% stenosed.   Ramus Intermedius Ramus lesion is 40% stenosed.   Left Circumflex Prox Cx to  Mid Cx lesion is 75% stenosed.   Right Coronary Artery Mid RCA lesion is 70% stenosed. The lesion was previously treated .   Graft To Dist RCA Origin lesion is 85% stenosed. The lesion was previously treated . Prox Graft to Mid Graft lesion is 40% stenosed. Mid Graft lesion is 60% stenosed. Dist Graft lesion is 70% stenosed.    Intervention   Origin lesion (Graft To Dist RCA) Angioplasty Balloon angioplasty was performed. Post-Intervention Lesion Assessment The intervention was successful. Pre-interventional TIMI flow is 3. Post-intervention TIMI flow is 3. There is a 25% residual stenosis post intervention.     Left Heart   Left Ventricle LV end diastolic pressure is low. LVEDP 6 mm Hg.   Diagnostic Dominance: Right  Intervention     Patient Profile     77 y.o. female  with a hx of CAD and prior MI who is being seen 05/08/2022 for the evaluation of STEMI after coil embolization treatment for acute lower GI bleed on 05/05/2022.  Assessment & Plan    Acute inferior and anterolateral STEMI: She has a remote history of multiple MIs and ICD implantation.  In 2008 she presented with STEMI and ventricular fibrillation cardiac arrest.  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  She had a remarkably good recovery and has been maintained on long-term DAPT with aspirin and clopidogrel. On 05/05/2022 she presented with painless hematochezia and is found to have a lower gastrointestinal bleed treated with coil embolization with good treatment response. 05/08/2022 morning she developed acute chest pain and EKG changes diagnostic of acute STEMI. Underwent emergency cath and PCI with balloon angioplasty of graft to distal RCA. She was off antiplatelets for 3 days and was loaded with clopidogrel 600 mg before doing her cath.  Recurrent chest pain overnight, relieved with nitro. Had transient 3 mm horizontal ST elevation inferiorly this morning associated with chest/throat tightness when eating that resolved, now with inferior T wave inversions.  Given ongoing chest pain with ST elevation, will likely need repeat cath today. Keep NPO (she ate some breakfast this morning). D/w Dr. Excell Seltzer and he will plan repeat cath and PCI likely this morning. Check 12 lead EKG  now and troponin x 2. BP soft, but started on amlodipine for spasm yesterday hsTrop 2614->1187 yesterday Asa/plavix indefinitely, imdur 60 mg bid, high intensity statin Lower GI bleed: Hgb 10.6->9.6->8.6. No BM since prior to cath. No abnormalities on right femoral access. May have lagged following the procedure and will follow primary team's plan for repeat H&H at 1400. Mixed hyperlipidemia: rosuvastatin 20 mg daily. LDL 45, at goal. Tobacco abuse: Will continue to revisit cessation counseling.  She has had extensive outpatient counseling. She is motivated to quit after this event.     For questions or updates, please contact [facility] Please consult [email or web address] for contact info under   Chrystie Nose, MD, Milagros Loll  [place]  [facility]  Medical Director of the [facility]  [facility] Diplomate of the American Board of Clinical Lipidology Attending Cardiologist  Direct Dial: [phone]  Fax: [phone]  Website:  [email or web address]

## 2022-05-11 NOTE — Plan of Care (Signed)
  Problem: Education: Goal: Understanding of CV disease, CV risk reduction, and recovery process will improve Outcome: Progressing Goal: Individualized Educational Video(s) Outcome: Progressing   Problem: Activity: Goal: Ability to return to baseline activity level will improve Outcome: Progressing   Problem: Cardiovascular: Goal: Ability to achieve and maintain adequate cardiovascular perfusion will improve Outcome: Progressing Goal: Vascular access site(s) Level 0-1 will be maintained Outcome: Progressing   Problem: Health Behavior/Discharge Planning: Goal: Ability to safely manage health-related needs after discharge will improve Outcome: Progressing   Problem: Education: Goal: Knowledge of General Education information will improve Description: Including pain rating scale, medication(s)/side effects and non-pharmacologic comfort measures Outcome: Progressing   Problem: Health Behavior/Discharge Planning: Goal: Ability to manage health-related needs will improve Outcome: Progressing   Problem: Clinical Measurements: Goal: Ability to maintain clinical measurements within normal limits will improve Outcome: Progressing Goal: Will remain free from infection Outcome: Progressing Goal: Diagnostic test results will improve Outcome: Progressing Goal: Respiratory complications will improve Outcome: Progressing Goal: Cardiovascular complication will be avoided Outcome: Progressing   Problem: Activity: Goal: Risk for activity intolerance will decrease Outcome: Progressing   Problem: Nutrition: Goal: Adequate nutrition will be maintained Outcome: Progressing   Problem: Coping: Goal: Level of anxiety will decrease Outcome: Progressing   Problem: Elimination: Goal: Will not experience complications related to bowel motility Outcome: Progressing Goal: Will not experience complications related to urinary retention Outcome: Progressing   Problem: Elimination: Goal: Will  not experience complications related to urinary retention Outcome: Progressing   Problem: Pain Managment: Goal: General experience of comfort will improve Outcome: Progressing   Problem: Safety: Goal: Ability to remain free from injury will improve Outcome: Progressing   Problem: Skin Integrity: Goal: Risk for impaired skin integrity will decrease Outcome: Progressing

## 2022-05-11 NOTE — H&P (View-Only) (Signed)
Rounding Note    Patient Name: Karen [name] Date of Encounter: 05/11/2022   [facility] Cardiologist: Lance Muss, MD Electrophysiologist:  Sherryl Manges, MD    Subjective   More chest pain overnight, relieved with nitro. This morning, had chest and throat pain with transient ST elevation of 3 mm horizontal in the inferior leads - this resolved spontaneously, followed by T wave inversion and now normalization of her EKG. H/H has been stable at 9/27. BP remains soft in the 90's on amlodipine - no room for additional medical therapy.  Inpatient Medications    Scheduled Meds:  amLODipine  2.5 mg Oral Daily   arformoterol  15 mcg Nebulization BID   And   umeclidinium bromide  1 puff Inhalation Daily   aspirin  81 mg Oral Daily   clopidogrel  75 mg Oral Q breakfast   cyanocobalamin  1,000 mcg Intramuscular Daily   furosemide  20 mg Oral Daily   isosorbide mononitrate  60 mg Oral BID   nicotine  21 mg Transdermal Daily   pantoprazole  40 mg Oral BID   rosuvastatin  20 mg Oral Daily   sodium chloride flush  3 mL Intravenous Q12H   sucralfate  1 g Oral BID   Continuous Infusions:  sodium chloride     PRN Meds: sodium chloride, acetaminophen, acetaminophen, albuterol, diazepam, nitroGLYCERIN, oxyCODONE-acetaminophen, sodium chloride flush   Vital Signs    Vitals:   05/10/22 2345 05/11/22 0316 05/11/22 0722 05/11/22 0758  BP: (!) 103/49 (!) 93/53 108/75   Pulse: 81 74 71   Resp: 14 18 20    Temp: 98 F (36.7 C) 98 F (36.7 C) 97.9 F (36.6 C)   TempSrc: Oral Oral Oral   SpO2: 97% 98% 97% 99%  Weight:      Height:        Intake/Output Summary (Last 24 hours) at 05/11/2022 0747 Last data filed at 05/11/2022 05/13/2022 Gross per 24 hour  Intake 740 ml  Output 1950 ml  Net -1210 ml      05/06/2022    8:55 PM 04/23/2022    9:10 AM 03/28/2022   11:07 AM  Last 3 Weights  Weight (lbs) 154 lb 5.2 oz 153 lb 153 lb  Weight (kg) 70 kg 69.4 kg 69.4 kg       Telemetry    NSR with short episode of ST elevations including leads II, III, aVF - Personally Reviewed  ECG    N/A  Physical Exam   General appearance: alert and no distress Neck: no carotid bruit, no JVD, and thyroid not enlarged, symmetric, no tenderness/mass/nodules Lungs: clear to auscultation bilaterally Heart: regular rate and rhythm, S1, S2 normal, no murmur, click, rub or gallop Abdomen: soft, non-tender; bowel sounds normal; no masses,  no organomegaly Extremities: extremities normal, atraumatic, no cyanosis or edema Pulses: 2+ and symmetric Skin: Skin color, texture, turgor normal. No rashes or lesions Neurologic: Grossly normal Psych: Pleasant  Labs    High Sensitivity Troponin:   Recent Labs  Lab 05/05/22 1354 05/06/22 0317 05/08/22 0552 05/08/22 1543 05/09/22 0617  TROPONINIHS <2 3 33* 2,614* 1,187*     Chemistry Recent Labs  Lab 05/05/22 1354 05/05/22 1354 05/06/22 0317 05/07/22 0048 05/08/22 0552 05/09/22 0617 05/10/22 0038 05/11/22 0013  NA 140  --  136 137   < > 137 136 137  K 3.8  --  4.0 3.2*   < > 3.6 3.7 3.9  CL 106  --  111  107   < > 107 107 107  CO2 26  --  18* 21*   < > 22 21* 22  GLUCOSE 111*  --  103* 100*   < > 110* 151* 162*  BUN 12  --  17 11   < > 11 14 15   CREATININE 1.20*  --  1.12* 0.98   < > 0.93 0.98 0.97  CALCIUM 8.6*  --  8.2* 8.2*   < > 8.1* 7.9* 8.4*  MG  --    < > 1.7 2.1  --   --  1.8 1.8  PROT 5.3*  --  4.8*  --   --   --   --  5.1*  ALBUMIN 3.2*  --  3.1*  --   --   --   --  2.7*  AST 16  --  14*  --   --   --   --  18  ALT 13  --  10  --   --   --   --  11  ALKPHOS 55  --  52  --   --   --   --  38  BILITOT 0.6  --  0.8  --   --   --   --  0.4  GFRNONAA 47*  --  51* 60*   < > >60 60* >60  ANIONGAP 8  --  7 9   < > 8 8 8    < > = values in this interval not displayed.    Lipids  Recent Labs  Lab 05/10/22 0038  CHOL 91  TRIG 48  HDL 36*  LDLCALC 45  CHOLHDL 2.5    Hematology Recent Labs  Lab  05/09/22 0617 05/10/22 0038 05/10/22 1444 05/11/22 0013  WBC 4.3 4.4  --  5.2  RBC 2.91* 2.72*  --  2.84*  HGB 9.6* 8.6* 9.4* 9.0*  HCT 28.5* 26.3* 27.7* 27.6*  MCV 97.9 96.7  --  97.2  MCH 33.0 31.6  --  31.7  MCHC 33.7 32.7  --  32.6  RDW 12.8 12.6  --  12.3  PLT 125* 124*  --  141*   Thyroid  Recent Labs  Lab 05/06/22 0317  TSH 1.004    BNPNo results for input(s): "BNP", "PROBNP" in the last 168 hours.  DDimer No results for input(s): "DDIMER" in the last 168 hours.   Radiology    No results found.  Cardiac Studies    2D echocardiogram 03/29/2022: 1. Left ventricular ejection fraction, by estimation, is 50 to 55%. The  left ventricle has low normal function. The left ventricle demonstrates  regional wall motion abnormalities (see scoring diagram/findings for  description). Left ventricular diastolic   parameters were normal.   2. Right ventricular systolic function is normal. The right ventricular  size is normal.   3. The mitral valve is normal in structure. Mild mitral valve  regurgitation. No evidence of mitral stenosis.   4. The aortic valve is tricuspid. Aortic valve regurgitation is not  visualized. No aortic stenosis is present.   5. The inferior vena cava is normal in size with greater than 50%  respiratory variability, suggesting right atrial pressure of 3 mmHg.    Cath 05/08/2022 Coronary Findings   Diagnostic Dominance: Right Left Anterior Descending Ost LAD to Prox LAD lesion is 30% stenosed. Dist LAD lesion is 30% stenosed.   Ramus Intermedius Ramus lesion is 40% stenosed.   Left Circumflex Prox Cx to  Mid Cx lesion is 75% stenosed.   Right Coronary Artery Mid RCA lesion is 70% stenosed. The lesion was previously treated .   Graft To Dist RCA Origin lesion is 85% stenosed. The lesion was previously treated . Prox Graft to Mid Graft lesion is 40% stenosed. Mid Graft lesion is 60% stenosed. Dist Graft lesion is 70% stenosed.    Intervention   Origin lesion (Graft To Dist RCA) Angioplasty Balloon angioplasty was performed. Post-Intervention Lesion Assessment The intervention was successful. Pre-interventional TIMI flow is 3. Post-intervention TIMI flow is 3. There is a 25% residual stenosis post intervention.     Left Heart   Left Ventricle LV end diastolic pressure is low. LVEDP 6 mm Hg.   Diagnostic Dominance: Right  Intervention     Patient Profile     77 y.o. female  with a hx of CAD and prior MI who is being seen 05/08/2022 for the evaluation of STEMI after coil embolization treatment for acute lower GI bleed on 05/05/2022.  Assessment & Plan    Acute inferior and anterolateral STEMI: She has a remote history of multiple MIs and ICD implantation.  In 2008 she presented with STEMI and ventricular fibrillation cardiac arrest.  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  She had a remarkably good recovery and has been maintained on long-term DAPT with aspirin and clopidogrel. On 05/05/2022 she presented with painless hematochezia and is found to have a lower gastrointestinal bleed treated with coil embolization with good treatment response. 05/08/2022 morning she developed acute chest pain and EKG changes diagnostic of acute STEMI. Underwent emergency cath and PCI with balloon angioplasty of graft to distal RCA. She was off antiplatelets for 3 days and was loaded with clopidogrel 600 mg before doing her cath.  Recurrent chest pain overnight, relieved with nitro. Had transient 3 mm horizontal ST elevation inferiorly this morning associated with chest/throat tightness when eating that resolved, now with inferior T wave inversions.  Given ongoing chest pain with ST elevation, will likely need repeat cath today. Keep NPO (she ate some breakfast this morning). D/w Dr. Excell Seltzer and he will plan repeat cath and PCI likely this morning. Check 12 lead EKG  now and troponin x 2. BP soft, but started on amlodipine for spasm yesterday hsTrop 2614->1187 yesterday Asa/plavix indefinitely, imdur 60 mg bid, high intensity statin Lower GI bleed: Hgb 10.6->9.6->8.6. No BM since prior to cath. No abnormalities on right femoral access. May have lagged following the procedure and will follow primary team's plan for repeat H&H at 1400. Mixed hyperlipidemia: rosuvastatin 20 mg daily. LDL 45, at goal. Tobacco abuse: Will continue to revisit cessation counseling.  She has had extensive outpatient counseling. She is motivated to quit after this event.     For questions or updates, please contact [facility] Please consult [email or web address] for contact info under   Chrystie Nose, MD, Milagros Loll  [place]  [facility]  Medical Director of the [facility]  [facility] Diplomate of the American Board of Clinical Lipidology Attending Cardiologist  Direct Dial: [phone]  Fax: [phone]  Website:  [email or web address]

## 2022-05-11 NOTE — Progress Notes (Signed)
Mobility Specialist Progress Note:   05/11/22 0826  Mobility  Activity Contraindicated/medical hold   Pt inappropriate for mobility specialist at this time given level of complexity, physical assist, and/or precautions as advised by RN team. Mobility specialist to hold today, will continue to follow for readiness.   Daine Gravel Mobility Specialist Please contact via SecureChat or  Rehab office at [phone]

## 2022-05-11 NOTE — Progress Notes (Signed)
PROGRESS NOTE    Karen [name]  OEJ:[id number] DOB: 1946-03-06 DOA: 05/05/2022 PCP: [facility]  Chief Complaint  Patient presents with   Abdominal Pain   Melena    Brief Narrative:  Karen [name] is Karen [name] 77 y.o. 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 Hemoglobin down from normal 12.9 to 10g/dl and subsequently 8.8 with active bleeding identified on CT angiogram. Underwent embolization of the distal left colic/sigmoid branch artery with resolution of bleeding. Hemoglobin stabilized. Developed chest pain and ECG changes consistent with STEMI on the morning of 1/10.  Underwent PCI.   Assessment & Plan:   Principal Problem:   Lower GI bleed Active Problems:   Adenocarcinoma, lung (HCC)   Tobacco use   Essential hypertension   Coronary artery disease involving native heart with angina pectoris (HCC)   VENTRICULAR FIBRILLATION   Stage 2 moderate COPD by GOLD classification (HCC)   Chest pain   Prolonged QT interval   Acute ST elevation myocardial infarction (STEMI) of inferolateral wall (HCC)  STEMI in the setting of known history of coronary artery disease status post CABG previously On 1/10, the patient developed severe substernal chest pain associated with dyspnea, abrupt bradycardia with occasional VT on telemetry and ECG consistent with ACS/STEMI.  Patient underwent cardiac catheterization with intervention on 1/10.  S/p PCI with PTCA in very proximal portion of the previously placed SVG stent with 85-90% stenosis being reduced to 25%.  Recommending DAPT indefinitely. Had CP again today, per cardiology.  Was started on amlodipine 1/12.  EKG similar today with T wave inversions in II, III, aVF and V3-6.  Similar to yesterday's EKG.  ST elevations noted on tele.  Continue DAPT, imdur 60 mg BID, high intensity statin.  Follow troponins, plan  for cath today.   Diverticular GI bleed Seen by gastroenterology.  Underwent distal left colic/sigmoid artery branch vessel embolization by Dr. Milford [name] with IR. Passed Karen [name] small amount of blood in the stool which is likely old blood.  Hemoglobin stable for the most part.  Now on dual antiplatelet treatment as mentioned above.     Acute blood loss anemia secondary to GI bleed See above.  She was transfused 1 unit of PRBC on 1/7.   Hb fluctuating, but stable Will continue to trend on DAPT   Thrombocytopenia Suspected to be new, but is stabilized >100k.    History of CHF with recovered EF, HTN: LVEF 50-55%.  Stable.  Euvolemic.   Hx VF arrest s/p AICD Monitor electrolytes.   Hypokalemia Replace and follow   COPD: Quiescent.  Continue home meds   History of left lung CA s/p SBRT 2022.  Stable   Stage IIIa/b CKD Based on baseline CrCl 45.48ml/min.    Hyperlipidemia Noted to be on atorvastatin.  Check lipid panel.  LDL 45.   Vitamin B12 deficiency Marginal B12 level and macrocytosis noted.  Supplemented IM while admitted, continue po supplement   Tobacco use:  - Cessation counseling provided - Nicotine patch while admitted   GERD, chronic dysphagia:  - Continue with plans for EGD with [facility].     DVT prophylaxis: SCD Code Status: full Family Communication: none Disposition:   Status is: Inpatient Remains inpatient appropriate because: awaiting cardiology clearance, stability of Hb   Consultants:  Cardiology IR GI  Procedures:  LHC  Ost  LAD to Prox LAD lesion is 30% stenosed.   Ramus lesion is 40% stenosed.   Prox Cx to Mid Cx lesion is 75% stenosed.   Mid RCA lesion is 70% stenosed.   Origin lesion is 85% stenosed.   Prox Graft to Mid Graft lesion is 40% stenosed.   Mid Graft lesion is 60% stenosed.   Dist Graft lesion is 70% stenosed.   Dist LAD lesion is 30% stenosed.   Balloon angioplasty was performed.   Post intervention, there is Karen [name]  25% residual stenosis.   LV end diastolic pressure is low.   Acute coronary syndrome with transient ST segment elevation in the inferior and anterolateral leads secondary to high-grade stenosis in Karen [name] stent in the proximal SVG supplying the distal RCA.   The left main coronary artery is widely patent status post remote intervention.  The LAD has 30% smooth proximal stenosis and 30% distal stenosis.  There is antegrade flow down the diagonal and LAD without competitive filling.   The ramus intermediate vessel has smooth 40% proximal stenosis.   Left circumflex vessel has eccentric 75% stenosis on Karen [name] bend in the vessel.   The native RCA has Karen [name] previously placed stent region of the acute margin and it appears that the distal RCA does not feel from the segment.   The SVG supplying the distal RCA is patent but diffusely diseased.  There appears to be Karen [name] stent in the very proximal portion of the graft with focal 85 to 90% stenosis just at the beginning of this stent.  The RCA graft has diffuse 40 6070% stenoses throughout.  The vessel fills the distal RCA PDA and PLA branches.   Atretic LIMA graft which had supplied the LAD.   Occluded vein graft which had supplied the circumflex vessel.   Occluded vein graft which had supplied the diagonal vessel.   LVEDP 6 mmHg.   Successful percutaneous coronary intervention with PTCA in the very proximal portion of the previously placed SVG stent with the 85 to 90% stenosis being reduced to 25%.   RECOMMENDATON: Continue DAPT indefinitely.  Apparently, Plavix had been held for several days due to the patient's recent lower GI bleed which was successfully treated with coil embolization by interventional radiology.  Aggressive lipid management with target LDL < 55.  Antimicrobials:  Anti-infectives (From admission, onward)    Start     Dose/Rate Route Frequency Ordered Stop   05/05/22 2236  ceFAZolin (ANCEF) IVPB 2g/100 mL premix  Status:  Discontinued         over 30 Minutes Intravenous Continuous PRN 05/05/22 2237 05/06/22 1734       Subjective: C/o CP again which occurred last night and then this AM Not doing anything, but was eating this morning  Objective: Vitals:   05/10/22 1950 05/10/22 2345 05/11/22 0316 05/11/22 0758  BP: 125/62 (!) 103/49 (!) 93/53 125/60  Pulse: 86 81 74 88  Resp: (!) 21 14 18 13   Temp:  98 F (36.7 C) 98 F (36.7 C) (!) 97.5 F (36.4 C)  TempSrc:  Oral Oral Oral  SpO2: 100% 97% 98% 99%  Weight:      Height:        Intake/Output Summary (Last 24 hours) at 05/11/2022 0950 Last data filed at 05/11/2022 0823 Gross per 24 hour  Intake 520 ml  Output 900 ml  Net -380 ml   Filed Weights   05/06/22 2055  Weight: 70 kg    Examination:  General: No  acute distress. Cardiovascular: Heart sounds show Tanyika Barros regular rate, and rhythm.  Lungs: Clear to auscultation bilaterally with good air movement.  Abdomen: Soft, nontender, nondistended Neurological: Alert and oriented 3. Moves all extremities 4 with equal strength. Cranial nerves II through XII grossly intact. Extremities: No clubbing or cyanosis. No edema.   Data Reviewed: I have personally reviewed following labs and imaging studies  CBC: Recent Labs  Lab 05/05/22 1354 05/05/22 2201 05/07/22 0048 05/08/22 0552 05/09/22 0617 05/10/22 0038 05/10/22 1444 05/11/22 0013  WBC 4.8   < > 5.9 4.9 4.3 4.4  --  5.2  NEUTROABS 2.7  --   --   --   --   --   --  2.5  HGB 10.0*   < > 10.0* 10.6* 9.6* 8.6* 9.4* 9.0*  HCT 32.0*   < > 30.7* 32.0* 28.5* 26.3* 27.7* 27.6*  MCV 100.9*   < > 98.1 96.1 97.9 96.7  --  97.2  PLT 146*   < > 124* 119* 125* 124*  --  141*   < > = values in this interval not displayed.    Basic Metabolic Panel: Recent Labs  Lab 05/06/22 0317 05/07/22 0048 05/08/22 0552 05/09/22 0617 05/10/22 0038 05/11/22 0013  NA 136 137 139 137 136 137  K 4.0 3.2* 3.4* 3.6 3.7 3.9  CL 111 107 107 107 107 107  CO2 18* 21* 24 22 21* 22   GLUCOSE 103* 100* 116* 110* 151* 162*  BUN 17 11 11 11 14 15   CREATININE 1.12* 0.98 0.94 0.93 0.98 0.97  CALCIUM 8.2* 8.2* 8.7* 8.1* 7.9* 8.4*  MG 1.7 2.1  --   --  1.8 1.8  PHOS 3.4  --   --   --   --  3.4    GFR: Estimated Creatinine Clearance: 46.3 mL/min (by C-G formula based on SCr of 0.97 mg/dL).  Liver Function Tests: Recent Labs  Lab 05/05/22 1354 05/06/22 0317 05/11/22 0013  AST 16 14* 18  ALT 13 10 11   ALKPHOS 55 52 38  BILITOT 0.6 0.8 0.4  PROT 5.3* 4.8* 5.1*  ALBUMIN 3.2* 3.1* 2.7*    CBG: No results for input(s): "GLUCAP" in the last 168 hours.   Recent Results (from the past 240 hour(s))  Resp panel by RT-PCR (RSV, Flu Adante Courington&B, Covid) Anterior Nasal Swab     Status: None   Collection Time: 05/05/22  1:54 PM   Specimen: Anterior Nasal Swab  Result Value Ref Range Status   SARS Coronavirus 2 by RT PCR NEGATIVE NEGATIVE Final    Comment: (NOTE) SARS-CoV-2 target nucleic acids are NOT DETECTED.  The SARS-CoV-2 RNA is generally detectable in upper respiratory specimens during the acute phase of infection. The lowest concentration of SARS-CoV-2 viral copies this assay can detect is 138 copies/mL. Carliss Porcaro negative result does not preclude SARS-Cov-2 infection and should not be used as the sole basis for treatment or other patient management decisions. Sammantha Mehlhaff negative result may occur with  improper specimen collection/handling, submission of specimen other than nasopharyngeal swab, presence of viral mutation(s) within the areas targeted by this assay, and inadequate number of viral copies(<138 copies/mL). Zayna Toste negative result must be combined with clinical observations, patient history, and epidemiological information. The expected result is Negative.  Fact Sheet for Patients:  EntrepreneurPulse.com.au  Fact Sheet for Healthcare Providers:  IncredibleEmployment.be  This test is no t yet approved or cleared by the [place] FDA and   has been authorized for detection and/or  diagnosis of SARS-CoV-2 by FDA under an Emergency Use Authorization (EUA). This EUA will remain  in effect (meaning this test can be used) for the duration of the COVID-19 declaration under Section 564(b)(1) of the Act, 21 U.S.C.section 360bbb-3(b)(1), unless the authorization is terminated  or revoked sooner.       Influenza Smiley Birr by PCR NEGATIVE NEGATIVE Final   Influenza B by PCR NEGATIVE NEGATIVE Final    Comment: (NOTE) The Xpert Xpress SARS-CoV-2/FLU/RSV plus assay is intended as an aid in the diagnosis of influenza from Nasopharyngeal swab specimens and should not be used as Jhoselyn Ruffini sole basis for treatment. Nasal washings and aspirates are unacceptable for Xpert Xpress SARS-CoV-2/FLU/RSV testing.  Fact Sheet for Patients: BloggerCourse.com  Fact Sheet for Healthcare Providers: SeriousBroker.it  This test is not yet approved or cleared by the [place] FDA and has been authorized for detection and/or diagnosis of SARS-CoV-2 by FDA under an Emergency Use Authorization (EUA). This EUA will remain in effect (meaning this test can be used) for the duration of the COVID-19 declaration under Section 564(b)(1) of the Act, 21 U.S.C. section 360bbb-3(b)(1), unless the authorization is terminated or revoked.     Resp Syncytial Virus by PCR NEGATIVE NEGATIVE Final    Comment: (NOTE) Fact Sheet for Patients: BloggerCourse.com  Fact Sheet for Healthcare Providers: SeriousBroker.it  This test is not yet approved or cleared by the [place] FDA and has been authorized for detection and/or diagnosis of SARS-CoV-2 by FDA under an Emergency Use Authorization (EUA). This EUA will remain in effect (meaning this test can be used) for the duration of the COVID-19 declaration under Section 564(b)(1) of the Act, 21 U.S.C. section 360bbb-3(b)(1),  unless the authorization is terminated or revoked.  Performed at [facility], [address], [place], [place] [address]   MRSA Next Gen by PCR, Nasal     Status: None   Collection Time: 05/06/22  9:00 PM   Specimen: Nasal Mucosa; Nasal Swab  Result Value Ref Range Status   MRSA by PCR Next Gen NOT DETECTED NOT DETECTED Final    Comment: (NOTE) The GeneXpert MRSA Assay (FDA approved for NASAL specimens only), is one component of Keleigh Kazee comprehensive MRSA colonization surveillance program. It is not intended to diagnose MRSA infection nor to guide or monitor treatment for MRSA infections. Test performance is not FDA approved in patients less than 72 years old. Performed at [facility], [address] [place], [place] [address]          Radiology Studies: No results found.      Scheduled Meds:  amLODipine  2.5 mg Oral Daily   arformoterol  15 mcg Nebulization BID   And   umeclidinium bromide  1 puff Inhalation Daily   aspirin  81 mg Oral Daily   clopidogrel  75 mg Oral Q breakfast   cyanocobalamin  1,000 mcg Intramuscular Daily   furosemide  20 mg Oral Daily   isosorbide mononitrate  60 mg Oral BID   nicotine  21 mg Transdermal Daily   pantoprazole  40 mg Oral BID   rosuvastatin  20 mg Oral Daily   sodium chloride flush  3 mL Intravenous Q12H   sucralfate  1 g Oral BID   Continuous Infusions:  sodium chloride       LOS: 6 days    Time spent: over 30 min    Lacretia Nicks, MD [facility]   To contact the attending provider between 7A-7P or  the covering provider during after hours 7P-7A, please log into the web site [email or web address] and access using universal Spry password for that web site. If you do not have the password, please call the hospital operator.  05/11/2022, 9:50 AM

## 2022-05-12 ENCOUNTER — Inpatient Hospital Stay (HOSPITAL_COMMUNITY): Payer: 59

## 2022-05-12 DIAGNOSIS — I200 Unstable angina: Secondary | ICD-10-CM

## 2022-05-12 DIAGNOSIS — Z955 Presence of coronary angioplasty implant and graft: Secondary | ICD-10-CM

## 2022-05-12 LAB — COMPREHENSIVE METABOLIC PANEL
ALT: 13 U/L (ref 0–44)
AST: 19 U/L (ref 15–41)
Albumin: 2.8 g/dL — ABNORMAL LOW (ref 3.5–5.0)
Alkaline Phosphatase: 51 U/L (ref 38–126)
Anion gap: 6 (ref 5–15)
BUN: 11 mg/dL (ref 8–23)
CO2: 26 mmol/L (ref 22–32)
Calcium: 8.4 mg/dL — ABNORMAL LOW (ref 8.9–10.3)
Chloride: 104 mmol/L (ref 98–111)
Creatinine, Ser: 0.93 mg/dL (ref 0.44–1.00)
GFR, Estimated: >60 mL/min (ref >60)
Glucose, Bld: 141 mg/dL — ABNORMAL HIGH (ref 70–99)
Potassium: 3.6 mmol/L (ref 3.5–5.1)
Sodium: 136 mmol/L (ref 135–145)
Total Bilirubin: 0.3 mg/dL (ref 0.3–1.2)
Total Protein: 5.2 g/dL — ABNORMAL LOW (ref 6.5–8.1)

## 2022-05-12 LAB — CBC WITH DIFFERENTIAL/PLATELET
Abs Immature Granulocytes: 0.02 10*3/uL (ref 0.00–0.07)
Basophils Absolute: 0 10*3/uL (ref 0.0–0.1)
Basophils Relative: 0 %
Eosinophils Absolute: 0.1 10*3/uL (ref 0.0–0.5)
Eosinophils Relative: 3 %
HCT: 27.9 % — ABNORMAL LOW (ref 36.0–46.0)
Hemoglobin: 9.3 g/dL — ABNORMAL LOW (ref 12.0–15.0)
Immature Granulocytes: 0 %
Lymphocytes Relative: 31 %
Lymphs Abs: 1.7 10*3/uL (ref 0.7–4.0)
MCH: 32.1 pg (ref 26.0–34.0)
MCHC: 33.3 g/dL (ref 30.0–36.0)
MCV: 96.2 fL (ref 80.0–100.0)
Monocytes Absolute: 0.4 10*3/uL (ref 0.1–1.0)
Monocytes Relative: 6 %
Neutro Abs: 3.4 10*3/uL (ref 1.7–7.7)
Neutrophils Relative %: 60 %
Platelets: 167 10*3/uL (ref 150–400)
RBC: 2.9 MIL/uL — ABNORMAL LOW (ref 3.87–5.11)
RDW: 12.2 % (ref 11.5–15.5)
WBC: 5.7 10*3/uL (ref 4.0–10.5)
nRBC: 0 % (ref 0.0–0.2)

## 2022-05-12 LAB — MAGNESIUM: Magnesium: 1.7 mg/dL (ref 1.7–2.4)

## 2022-05-12 LAB — PHOSPHORUS: Phosphorus: 3.8 mg/dL (ref 2.5–4.6)

## 2022-05-12 MED ORDER — POTASSIUM CHLORIDE CRYS ER 20 MEQ PO TBCR
40.0000 meq | EXTENDED_RELEASE_TABLET | Freq: Once | ORAL | Status: AC
  Administered 2022-05-12: 40 meq via ORAL
  Filled 2022-05-12: qty 2

## 2022-05-12 MED ORDER — POLYETHYLENE GLYCOL 3350 17 G PO PACK
17.0000 g | PACK | Freq: Every day | ORAL | Status: DC
  Administered 2022-05-12 – 2022-05-16 (×5): 17 g via ORAL
  Filled 2022-05-12 (×5): qty 1

## 2022-05-12 MED ORDER — MAGNESIUM OXIDE -MG SUPPLEMENT 400 (240 MG) MG PO TABS
400.0000 mg | ORAL_TABLET | Freq: Every day | ORAL | Status: DC
  Administered 2022-05-12 – 2022-05-16 (×5): 400 mg via ORAL
  Filled 2022-05-12 (×5): qty 1

## 2022-05-12 NOTE — Plan of Care (Signed)

## 2022-05-12 NOTE — Progress Notes (Signed)
Rounding Note    Patient Name: Karen [name] Date of Encounter: 05/12/2022  [facility] Cardiologist: Lance Muss, MD Electrophysiologist:  Sherryl Manges, MD    Subjective   Feels much better today. No chest pain overnight. No EKG changes noted on telemetry. Some oozing from the groin site which has stopped. Underwent PCI with 3 stents to the SVG-RCA yesterday.   Inpatient Medications    Scheduled Meds:  amLODipine  2.5 mg Oral Daily   arformoterol  15 mcg Nebulization BID   And   umeclidinium bromide  1 puff Inhalation Daily   aspirin  81 mg Oral Daily   clopidogrel  75 mg Oral Q breakfast   cyanocobalamin  1,000 mcg Intramuscular Daily   furosemide  20 mg Oral Daily   isosorbide mononitrate  60 mg Oral BID   nicotine  21 mg Transdermal Daily   pantoprazole  40 mg Oral BID   rosuvastatin  20 mg Oral Daily   sodium chloride flush  3 mL Intravenous Q12H   sodium chloride flush  3 mL Intravenous Q12H   sucralfate  1 g Oral BID   Continuous Infusions:  sodium chloride     sodium chloride     PRN Meds: sodium chloride, sodium chloride, acetaminophen, acetaminophen, albuterol, diazepam, nitroGLYCERIN, oxyCODONE-acetaminophen, sodium chloride flush, sodium chloride flush   Vital Signs    Vitals:   05/11/22 2027 05/11/22 2319 05/12/22 0800 05/12/22 1152  BP: 111/64 (!) 100/55 (!) 108/55 (!) 81/55  Pulse: 82 93 84 96  Resp: (!) 22 17 16    Temp:  98.7 F (37.1 C) 98.1 F (36.7 C) 98.1 F (36.7 C)  TempSrc:  Oral Oral Oral  SpO2: 100% 98% 98% 98%  Weight:      Height:        Intake/Output Summary (Last 24 hours) at 05/12/2022 1303 Last data filed at 05/12/2022 1153 Gross per 24 hour  Intake 627.74 ml  Output 1900 ml  Net -1272.26 ml      05/06/2022    8:55 PM 04/23/2022    9:10 AM 03/28/2022   11:07 AM  Last 3 Weights  Weight (lbs) 154 lb 5.2 oz 153 lb 153 lb  Weight (kg) 70 kg 69.4 kg 69.4 kg      Telemetry    NSR  - Personally  Reviewed  ECG    N/A  Physical Exam   General appearance: alert and no distress Neck: no carotid bruit, no JVD, and thyroid not enlarged, symmetric, no tenderness/mass/nodules Lungs: clear to auscultation bilaterally Heart: regular rate and rhythm, S1, S2 normal, no murmur, click, rub or gallop Abdomen: soft, non-tender; bowel sounds normal; no masses,  no organomegaly Extremities: extremities normal, atraumatic, no cyanosis or edema Pulses: 2+ and symmetric Skin: Skin color, texture, turgor normal. No rashes or lesions Neurologic: Grossly normal Psych: Pleasant  Labs    High Sensitivity Troponin:   Recent Labs  Lab 05/08/22 0552 05/08/22 1543 05/09/22 0617 05/11/22 0942 05/11/22 1607  TROPONINIHS 33* 2,614* 1,187* 313* 294*     Chemistry Recent Labs  Lab 05/06/22 0317 05/07/22 0048 05/10/22 0038 05/11/22 0013 05/12/22 0021  NA 136   < > 136 137 136  K 4.0   < > 3.7 3.9 3.6  CL 111   < > 107 107 104  CO2 18*   < > 21* 22 26  GLUCOSE 103*   < > 151* 162* 141*  BUN 17   < > 14 15  11  CREATININE 1.12*   < > 0.98 0.97 0.93  CALCIUM 8.2*   < > 7.9* 8.4* 8.4*  MG 1.7   < > 1.8 1.8 1.7  PROT 4.8*  --   --  5.1* 5.2*  ALBUMIN 3.1*  --   --  2.7* 2.8*  AST 14*  --   --  18 19  ALT 10  --   --  11 13  ALKPHOS 52  --   --  38 51  BILITOT 0.8  --   --  0.4 0.3  GFRNONAA 51*   < > 60* >60 >60  ANIONGAP 7   < > 8 8 6    < > = values in this interval not displayed.    Lipids  Recent Labs  Lab 05/10/22 0038  CHOL 91  TRIG 48  HDL 36*  LDLCALC 45  CHOLHDL 2.5    Hematology Recent Labs  Lab 05/10/22 0038 05/10/22 1444 05/11/22 0013 05/12/22 0021  WBC 4.4  --  5.2 5.7  RBC 2.72*  --  2.84* 2.90*  HGB 8.6* 9.4* 9.0* 9.3*  HCT 26.3* 27.7* 27.6* 27.9*  MCV 96.7  --  97.2 96.2  MCH 31.6  --  31.7 32.1  MCHC 32.7  --  32.6 33.3  RDW 12.6  --  12.3 12.2  PLT 124*  --  141* 167   Thyroid  Recent Labs  Lab 05/06/22 0317  TSH 1.004    BNPNo results for  input(s): "BNP", "PROBNP" in the last 168 hours.  DDimer No results for input(s): "DDIMER" in the last 168 hours.   Radiology    CARDIAC CATHETERIZATION  Result Date: 05/11/2022 1.  Total occlusion of the native RCA 2.  Patent left main and LAD with mild diffuse nonobstructive plaquing, no significant changes from recent cardiac catheterization study 3.  Patent left circumflex and intermediate branch with diffuse plaquing and distal vessel disease but no significant proximal stenosis 4.  Known occlusion of diagonal and obtuse marginal saphenous vein grafts 5.  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  Severe saphenous vein graft to RCA stenoses, treated with drug-eluting stent implantation (3.0 x 24 mm Synergy DES in distal body of graft, overlapping 3.0 x 32 mm and 3.0 x 16 mm Synergy DES in proximal body of graft within the previously implanted stent) Recommendations: Long-term DAPT with aspirin and clopidogrel as tolerated.    Cardiac Studies    2D echocardiogram 03/29/2022: 1. Left ventricular ejection fraction, by estimation, is 50 to 55%. The  left ventricle has low normal function. The left ventricle demonstrates  regional wall motion abnormalities (see scoring diagram/findings for  description). Left ventricular diastolic   parameters were normal.   2. Right ventricular systolic function is normal. The right ventricular  size is normal.   3. The mitral valve is normal in structure. Mild mitral valve  regurgitation. No evidence of mitral stenosis.   4. The aortic valve is tricuspid. Aortic valve regurgitation is not  visualized. No aortic stenosis is present.   5. The inferior vena cava is normal in size with greater than 50%  respiratory variability, suggesting right atrial pressure of 3 mmHg.    Cath 05/08/2022 Coronary Findings   Diagnostic Dominance: Right Left Anterior Descending Ost LAD to Prox LAD lesion is 30% stenosed. Dist LAD lesion is 30% stenosed.   Ramus Intermedius Ramus  lesion is 40% stenosed.   Left Circumflex Prox Cx to Mid Cx lesion is 75% stenosed.  Right Coronary Artery Mid RCA lesion is 70% stenosed. The lesion was previously treated .   Graft To Dist RCA Origin lesion is 85% stenosed. The lesion was previously treated . Prox Graft to Mid Graft lesion is 40% stenosed. Mid Graft lesion is 60% stenosed. Dist Graft lesion is 70% stenosed.   Intervention   Origin lesion (Graft To Dist RCA) Angioplasty Balloon angioplasty was performed. Post-Intervention Lesion Assessment The intervention was successful. Pre-interventional TIMI flow is 3. Post-intervention TIMI flow is 3. There is a 25% residual stenosis post intervention.     Left Heart   Left Ventricle LV end diastolic pressure is low. LVEDP 6 mm Hg.   Diagnostic Dominance: Right  Intervention     Patient Profile     77 y.o. female  with a hx of CAD and prior MI who is being seen 05/08/2022 for the evaluation of STEMI after coil embolization treatment for acute lower GI bleed on 05/05/2022.  Assessment & Plan    Acute inferior and anterolateral STEMI: She has a remote history of multiple MIs and ICD implantation.  In 2008 she presented with STEMI and ventricular fibrillation cardiac arrest.  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  She had a remarkably good recovery and has been maintained on long-term DAPT with aspirin and clopidogrel. On 05/05/2022 she presented with painless hematochezia and is found to have a lower gastrointestinal bleed treated with coil embolization with good treatment response. 05/08/2022 morning she developed acute chest pain and EKG changes diagnostic of acute STEMI. Underwent emergency cath and PCI with balloon angioplasty of graft to distal RCA. She was off antiplatelets for 3 days and was loaded with clopidogrel 600 mg before doing her cath.  Good result with PCI to the SVG to RCA overnight -  no further chest pain or transient ST elevations. Asa/plavix indefinitely, imdur 60 mg bid, high intensity statin Lower GI bleed: hemoglobin stable at 9.3. Mixed hyperlipidemia: rosuvastatin 20 mg daily. LDL 45, at goal. Tobacco abuse: Will continue to revisit cessation counseling.  She has had extensive outpatient counseling. She is motivated to quit after this event.  Ok for d/c home today from a cardiology standpoint. Follow-up with Dr. Eldridge Dace.     For questions or updates, please contact [facility] Please consult [email or web address] for contact info under   Chrystie Nose, MD, Milagros Loll  [place]  [facility]  Medical Director of the Advanced Lipid Disorders &  Cardiovascular Risk Reduction Clinic Diplomate of the American Board of Clinical Lipidology Attending Cardiologist  Direct Dial: [phone]  Fax: [phone]  Website:  [email or web address]

## 2022-05-12 NOTE — Evaluation (Signed)
Physical Therapy Evaluation Patient Details Name: Karen [name] MRN: [id number] DOB: 03-Mar-1946 Today's Date: 05/12/2022  History of Present Illness  Pt is a 77 y.o. F who presents 05/05/2022 with painless hematochezia. Active bleeding identified on CT angiogram. Underwent embolization of the distal left colic/sigmoid branch artery with resolution of bleeding. Hemoglobin stabilized. Developed chest pain and ECG changes consistent with STEMI on the morning of 1/10.  Underwent PCI. 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  Clinical Impression  Pt admitted with above. Pt has had a significant decline in mobility in comparison to her baseline with deficits noted in strength, balance, and endurance. Pt is typically independent with a cane. She lives with her 2 sons, however, one of her sons has had a stroke and the other son is his primary caregiver. Pt currently requiring moderate assist for transfers and ambulating ~45 ft with a Rollator. Her gait speed is extremely slow and she needs several extended rest breaks and is unable to stand upright due to BUE fatigue. Pt would benefit from post acute rehab to address deficits and maximize functional mobility. Pt reports, "I will not go to a rest home." Will continue to progress as tolerated.     Recommendations for follow up therapy are one component of a multi-disciplinary discharge planning process, led by the attending physician.  Recommendations may be updated based on patient status, additional functional criteria and insurance authorization.  Follow Up Recommendations Acute inpatient rehab (3hours/day)      Assistance Recommended at Discharge Frequent or constant Supervision/Assistance  Patient can return home with the following  A lot of help with walking and/or transfers;A lot of help with bathing/dressing/bathroom;Assistance with cooking/housework;Assist for transportation;Help with stairs or ramp for  entrance    Equipment Recommendations BSC/3in1;Wheelchair (measurements PT);Wheelchair cushion (measurements PT)  Recommendations for Other Services  Rehab consult    Functional Status Assessment Patient has had a recent decline in their functional status and demonstrates the ability to make significant improvements in function in a reasonable and predictable amount of time.     Precautions / Restrictions Precautions Precautions: Fall Restrictions Weight Bearing Restrictions: No      Mobility  Bed Mobility Overal bed mobility: Needs Assistance Bed Mobility: Supine to Sit     Supine to sit: Supervision     General bed mobility comments: Increased time    Transfers Overall transfer level: Needs assistance Equipment used: Rollator (4 wheels) Transfers: Sit to/from Stand Sit to Stand: Mod assist           General transfer comment: ModA to power up, increased time/effort    Ambulation/Gait Ambulation/Gait assistance: Min assist Gait Distance (Feet): 45 Feet Assistive device: Rollator (4 wheels) Gait Pattern/deviations: Step-through pattern, Decreased stride length, Decreased weight shift to right, Decreased dorsiflexion - right Gait velocity: decreased Gait velocity interpretation: <1.31 ft/sec, indicative of household ambulator   General Gait Details: Increased RLE circumduction, antalgic gait pattern. Pt requiring two extended standing rest breaks, leaning down onto elbows on Rollator. Very slow and effortful gait.  Stairs            Wheelchair Mobility    Modified Rankin (Stroke Patients Only)       Balance Overall balance assessment: Needs assistance Sitting-balance support: Feet supported Sitting balance-Leahy Scale: Fair     Standing balance support: Bilateral upper extremity supported Standing balance-Leahy Scale: Poor Standing balance comment: heavily reliant through BUE's  Pertinent Vitals/Pain  Pain Assessment Pain Assessment: Faces Faces Pain Scale: Hurts little more Pain Location: RLE Pain Descriptors / Indicators: Grimacing, Guarding Pain Intervention(s): Limited activity within patient's tolerance, Monitored during session    Home Living Family/patient expects to be discharged to:: Private residence Living Arrangements: Children (2 sons) Available Help at Discharge: Family Type of Home: House Home Access: Stairs to enter Entrance Stairs-Rails: Doctor, general practice of Steps: 4   Home Layout: One level Home Equipment: Cane - single point;Rollator (4 wheels);Shower seat      Prior Function Prior Level of Function : Independent/Modified Independent             Mobility Comments: using cane       Hand Dominance        Extremity/Trunk Assessment   Upper Extremity Assessment Upper Extremity Assessment: Generalized weakness    Lower Extremity Assessment Lower Extremity Assessment: Generalized weakness    Cervical / Trunk Assessment Cervical / Trunk Assessment: Kyphotic  Communication   Communication: No difficulties  Cognition Arousal/Alertness: Awake/alert Behavior During Therapy: WFL for tasks assessed/performed Overall Cognitive Status: Within Functional Limits for tasks assessed                                 General Comments: Likely baseline, but decreased insight into deficits        General Comments      Exercises     Assessment/Plan    PT Assessment Patient needs continued PT services  PT Problem List Decreased strength;Decreased activity tolerance;Decreased balance;Decreased mobility;Cardiopulmonary status limiting activity       PT Treatment Interventions DME instruction;Gait training;Stair training;Functional mobility training;Therapeutic activities;Therapeutic exercise;Balance training;Patient/family education    PT Goals (Current goals can be found in the Care Plan section)  Acute Rehab PT  Goals Patient Stated Goal: go home PT Goal Formulation: With patient Time For Goal Achievement: 05/26/22 Potential to Achieve Goals: Good    Frequency Min 3X/week     Co-evaluation               AM-PAC PT "6 Clicks" Mobility  Outcome Measure Help needed turning from your back to your side while in a flat bed without using bedrails?: A Little Help needed moving from lying on your back to sitting on the side of a flat bed without using bedrails?: A Little Help needed moving to and from a bed to a chair (including a wheelchair)?: A Little Help needed standing up from a chair using your arms (e.g., wheelchair or bedside chair)?: A Lot Help needed to walk in hospital room?: A Little Help needed climbing 3-5 steps with a railing? : Total 6 Click Score: 15    End of Session Equipment Utilized During Treatment: Gait belt Activity Tolerance: Patient limited by fatigue Patient left: in chair;with call bell/phone within reach Nurse Communication: Mobility status PT Visit Diagnosis: Unsteadiness on feet (R26.81);Other abnormalities of gait and mobility (R26.89);Muscle weakness (generalized) (M62.81);Difficulty in walking, not elsewhere classified (R26.2)    Time: 7885-8076 PT Time Calculation (min) (ACUTE ONLY): 29 min   Charges:   PT Evaluation $PT Eval Moderate Complexity: 1 Mod PT Treatments $Therapeutic Activity: 8-22 mins        Lillia Pauls, PT, DPT Acute Rehabilitation Services Office [phone]   Norval Morton 05/12/2022, 4:17 PM

## 2022-05-12 NOTE — Progress Notes (Signed)
PROGRESS NOTE    Karen [name]  PXT:[id number] DOB: [birth year] DOA: 05/05/2022 PCP: [facility]  Chief Complaint  Patient presents with   Abdominal Pain   Melena    Brief Narrative:  Karen [name] is Karen [name] 77 y.o. 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 Hemoglobin down from normal 12.9 to 10g/dl and subsequently 8.8 with active bleeding identified on CT angiogram. Underwent embolization of the distal left colic/sigmoid branch artery with resolution of bleeding. Hemoglobin stabilized. Developed chest pain and ECG changes consistent with STEMI on the morning of 1/10.  Underwent PCI.  Had repeat cath done 1/13 due to recurrent chest pain.  Currently stable without CP.  Discharge pending therapy eval, seems like she's gotten deconditioned.  Assessment & Plan:   Principal Problem:   Lower GI bleed Active Problems:   Adenocarcinoma, lung (HCC)   Tobacco use   Essential hypertension   Coronary artery disease involving native heart with angina pectoris (HCC)   VENTRICULAR FIBRILLATION   Stage 2 moderate COPD by GOLD classification (HCC)   ST elevation myocardial infarction involving right coronary artery (HCC)   Chest pain   Prolonged QT interval   Acute ST elevation myocardial infarction (STEMI) of inferolateral wall (HCC)  STEMI in the setting of known history of coronary artery disease status post CABG previously On 1/10, the patient developed severe substernal chest pain associated with dyspnea, abrupt bradycardia with occasional VT on telemetry and ECG consistent with ACS/STEMI.  Patient underwent cardiac catheterization with intervention on 1/10.  S/p PCI with PTCA in very proximal portion of the previously placed SVG stent with 85-90% stenosis being reduced to 25%.  Recommending DAPT indefinitely. Repeat cath 1/13 with severe saphenous vein  graft to RCA stenoses, treated with drug eluting stent implantation - recommending long term DAPT  started on amlodipine 1/12.  EKG similar today with T wave inversions in II, III, aVF and V3-6.  Similar to yesterday's EKG.  ST elevations noted on tele.  Continue DAPT, imdur 60 mg BID, high intensity statin.  Follow troponins, plan for cath today.   Diverticular GI bleed Seen by gastroenterology.  Underwent distal left colic/sigmoid artery branch vessel embolization by Dr. Milford Cage with IR. Hb stable Follow outpatient     Acute blood loss anemia secondary to GI bleed See above.  She was transfused 1 unit of PRBC on 1/7.   Hb fluctuating, but stable Will continue to trend on DAPT   Thrombocytopenia resolved   History of CHF with recovered EF, HTN: LVEF 50-55%.  Stable.  Euvolemic.   Hx VF arrest s/p AICD Monitor electrolytes.   Hypokalemia Replace and follow   COPD: Quiescent.  Continue home meds   History of left lung CA s/p SBRT 2022.  Stable   Stage IIIa/b CKD Based on baseline CrCl 45.75ml/min.    Hyperlipidemia Noted to be on atorvastatin.  Check lipid panel.  LDL 45.   Vitamin B12 deficiency Marginal B12 level and macrocytosis noted.  Supplemented IM while admitted, continue po supplement   Tobacco use:  - Cessation counseling provided - Nicotine patch while admitted   GERD, chronic dysphagia:  - Continue with plans for EGD with [facility].   Awaiting therapy evaluation     DVT prophylaxis: SCD Code Status: full Family Communication: none Disposition:   Status is: Inpatient Remains  inpatient appropriate because: awaiting cardiology clearance, stability of Hb   Consultants:  Cardiology IR GI  Procedures:  LHC  Ost LAD to Prox LAD lesion is 30% stenosed.   Ramus lesion is 40% stenosed.   Prox Cx to Mid Cx lesion is 75% stenosed.   Mid RCA lesion is 70% stenosed.   Origin lesion is 85% stenosed.   Prox Graft to Mid Graft lesion is  40% stenosed.   Mid Graft lesion is 60% stenosed.   Dist Graft lesion is 70% stenosed.   Dist LAD lesion is 30% stenosed.   Balloon angioplasty was performed.   Post intervention, there is Mikeal Winstanley 25% residual stenosis.   LV end diastolic pressure is low.   Acute coronary syndrome with transient ST segment elevation in the inferior and anterolateral leads secondary to high-grade stenosis in Ellanie Oppedisano stent in the proximal SVG supplying the distal RCA.   The left main coronary artery is widely patent status post remote intervention.  The LAD has 30% smooth proximal stenosis and 30% distal stenosis.  There is antegrade flow down the diagonal and LAD without competitive filling.   The ramus intermediate vessel has smooth 40% proximal stenosis.   Left circumflex vessel has eccentric 75% stenosis on Raghav Verrilli bend in the vessel.   The native RCA has Carder Yin previously placed stent region of the acute margin and it appears that the distal RCA does not feel from the segment.   The SVG supplying the distal RCA is patent but diffusely diseased.  There appears to be Haydin Dunn stent in the very proximal portion of the graft with focal 85 to 90% stenosis just at the beginning of this stent.  The RCA graft has diffuse 40 6070% stenoses throughout.  The vessel fills the distal RCA PDA and PLA branches.   Atretic LIMA graft which had supplied the LAD.   Occluded vein graft which had supplied the circumflex vessel.   Occluded vein graft which had supplied the diagonal vessel.   LVEDP 6 mmHg.   Successful percutaneous coronary intervention with PTCA in the very proximal portion of the previously placed SVG stent with the 85 to 90% stenosis being reduced to 25%.   RECOMMENDATON: Continue DAPT indefinitely.  Apparently, Plavix had been held for several days due to the patient's recent lower GI bleed which was successfully treated with coil embolization by interventional radiology.  Aggressive lipid management with target LDL <  55.  ----------------------  1.  Total occlusion of the native RCA 2.  Patent left main and LAD with mild diffuse nonobstructive plaquing, no significant changes from recent cardiac catheterization study 3.  Patent left circumflex and intermediate branch with diffuse plaquing and distal vessel disease but no significant proximal stenosis 4.  Known occlusion of diagonal and obtuse marginal saphenous vein grafts 5.  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  Severe saphenous vein graft to RCA stenoses, treated with drug-eluting stent implantation (3.0 x 24 mm Synergy DES in distal body of graft, overlapping 3.0 x 32 mm and 3.0 x 16 mm Synergy DES in proximal body of graft within the previously implanted stent)   Recommendations: Long-term DAPT with aspirin and clopidogrel as tolerated. Antimicrobials:  Anti-infectives (From admission, onward)    Start     Dose/Rate Route Frequency Ordered Stop   05/05/22 2236  ceFAZolin (ANCEF) IVPB 2g/100 mL premix  Status:  Discontinued        over 30 Minutes Intravenous Continuous PRN 05/05/22 2237 05/06/22 1734  Subjective: No CP Has cough and mild SOB  Objective: Vitals:   05/11/22 2027 05/11/22 2319 05/12/22 0800 05/12/22 1152  BP: 111/64 (!) 100/55 (!) 108/55 (!) 81/55  Pulse: 82 93 84 96  Resp: (!) 22 17 16    Temp:  98.7 F (37.1 C) 98.1 F (36.7 C) 98.1 F (36.7 C)  TempSrc:  Oral Oral Oral  SpO2: 100% 98% 98% 98%  Weight:      Height:        Intake/Output Summary (Last 24 hours) at 05/12/2022 1357 Last data filed at 05/12/2022 1153 Gross per 24 hour  Intake 627.74 ml  Output 1900 ml  Net -1272.26 ml   Filed Weights   05/06/22 2055  Weight: 70 kg    Examination:  General: No acute distress. Cardiovascular: RRR Lungs: unlabored, CTAB  Abdomen: Soft, nontender, nondistended  Neurological: Alert and oriented 3. Moves all extremities 4 with equal strength. Cranial nerves II through XII grossly intact. Extremities: No clubbing  or cyanosis. No edema.   Data Reviewed: I have personally reviewed following labs and imaging studies  CBC: Recent Labs  Lab 05/08/22 0552 05/09/22 0617 05/10/22 0038 05/10/22 1444 05/11/22 0013 05/12/22 0021  WBC 4.9 4.3 4.4  --  5.2 5.7  NEUTROABS  --   --   --   --  2.5 3.4  HGB 10.6* 9.6* 8.6* 9.4* 9.0* 9.3*  HCT 32.0* 28.5* 26.3* 27.7* 27.6* 27.9*  MCV 96.1 97.9 96.7  --  97.2 96.2  PLT 119* 125* 124*  --  141* 167    Basic Metabolic Panel: Recent Labs  Lab 05/06/22 0317 05/07/22 0048 05/08/22 0552 05/09/22 0617 05/10/22 0038 05/11/22 0013 05/12/22 0021  NA 136 137 139 137 136 137 136  K 4.0 3.2* 3.4* 3.6 3.7 3.9 3.6  CL 111 107 107 107 107 107 104  CO2 18* 21* 24 22 21* 22 26  GLUCOSE 103* 100* 116* 110* 151* 162* 141*  BUN 17 11 11 11 14 15 11   CREATININE 1.12* 0.98 0.94 0.93 0.98 0.97 0.93  CALCIUM 8.2* 8.2* 8.7* 8.1* 7.9* 8.4* 8.4*  MG 1.7 2.1  --   --  1.8 1.8 1.7  PHOS 3.4  --   --   --   --  3.4 3.8    GFR: Estimated Creatinine Clearance: 48.3 mL/min (by C-G formula based on SCr of 0.93 mg/dL).  Liver Function Tests: Recent Labs  Lab 05/06/22 0317 05/11/22 0013 05/12/22 0021  AST 14* 18 19  ALT 10 11 13   ALKPHOS 52 38 51  BILITOT 0.8 0.4 0.3  PROT 4.8* 5.1* 5.2*  ALBUMIN 3.1* 2.7* 2.8*    CBG: No results for input(s): "GLUCAP" in the last 168 hours.   Recent Results (from the past 240 hour(s))  Resp panel by RT-PCR (RSV, Flu Jairon Ripberger&B, Covid) Anterior Nasal Swab     Status: None   Collection Time: 05/05/22  1:54 PM   Specimen: Anterior Nasal Swab  Result Value Ref Range Status   SARS Coronavirus 2 by RT PCR NEGATIVE NEGATIVE Final    Comment: (NOTE) SARS-CoV-2 target nucleic acids are NOT DETECTED.  The SARS-CoV-2 RNA is generally detectable in upper respiratory specimens during the acute phase of infection. The lowest concentration of SARS-CoV-2 viral copies this assay can detect is 138 copies/mL. Giani Winther negative result does not preclude  SARS-Cov-2 infection and should not be used as the sole basis for treatment or other patient management decisions. Sylwia Cuervo negative result may occur  with  improper specimen collection/handling, submission of specimen other than nasopharyngeal swab, presence of viral mutation(s) within the areas targeted by this assay, and inadequate number of viral copies(<138 copies/mL). Dicky Boer negative result must be combined with clinical observations, patient history, and epidemiological information. The expected result is Negative.  Fact Sheet for Patients:  BloggerCourse.com  Fact Sheet for Healthcare Providers:  SeriousBroker.it  This test is no t yet approved or cleared by the [place] FDA and  has been authorized for detection and/or diagnosis of SARS-CoV-2 by FDA under an Emergency Use Authorization (EUA). This EUA will remain  in effect (meaning this test can be used) for the duration of the COVID-19 declaration under Section 564(b)(1) of the Act, 21 U.S.C.section 360bbb-3(b)(1), unless the authorization is terminated  or revoked sooner.       Influenza Zell Hylton by PCR NEGATIVE NEGATIVE Final   Influenza B by PCR NEGATIVE NEGATIVE Final    Comment: (NOTE) The Xpert Xpress SARS-CoV-2/FLU/RSV plus assay is intended as an aid in the diagnosis of influenza from Nasopharyngeal swab specimens and should not be used as Naphtali Riede sole basis for treatment. Nasal washings and aspirates are unacceptable for Xpert Xpress SARS-CoV-2/FLU/RSV testing.  Fact Sheet for Patients: BloggerCourse.com  Fact Sheet for Healthcare Providers: SeriousBroker.it  This test is not yet approved or cleared by the [place] FDA and has been authorized for detection and/or diagnosis of SARS-CoV-2 by FDA under an Emergency Use Authorization (EUA). This EUA will remain in effect (meaning this test can be used) for the duration of  the COVID-19 declaration under Section 564(b)(1) of the Act, 21 U.S.C. section 360bbb-3(b)(1), unless the authorization is terminated or revoked.     Resp Syncytial Virus by PCR NEGATIVE NEGATIVE Final    Comment: (NOTE) Fact Sheet for Patients: BloggerCourse.com  Fact Sheet for Healthcare Providers: SeriousBroker.it  This test is not yet approved or cleared by the [place] FDA and has been authorized for detection and/or diagnosis of SARS-CoV-2 by FDA under an Emergency Use Authorization (EUA). This EUA will remain in effect (meaning this test can be used) for the duration of the COVID-19 declaration under Section 564(b)(1) of the Act, 21 U.S.C. section 360bbb-3(b)(1), unless the authorization is terminated or revoked.  Performed at [facility], [address] [place], [place] [address]   MRSA Next Gen by PCR, Nasal     Status: None   Collection Time: 05/06/22  9:00 PM   Specimen: Nasal Mucosa; Nasal Swab  Result Value Ref Range Status   MRSA by PCR Next Gen NOT DETECTED NOT DETECTED Final    Comment: (NOTE) The GeneXpert MRSA Assay (FDA approved for NASAL specimens only), is one component of Dresden Lozito comprehensive MRSA colonization surveillance program. It is not intended to diagnose MRSA infection nor to guide or monitor treatment for MRSA infections. Test performance is not FDA approved in patients less than 54 years old. Performed at [facility], [address], [place], [place] [address]          Radiology Studies: DG CHEST PORT 1 VIEW  Result Date: 05/12/2022 CLINICAL DATA:  Shortness of breath. EXAM: PORTABLE CHEST 1 VIEW COMPARISON:  Radiograph 05/05/2022 FINDINGS: Prior median sternotomy. Left-sided pacemaker remains in place. Coronary stent is visualized. The heart is normal in size. Stable mediastinal contours. Aortic atherosclerosis. No pulmonary edema, focal airspace disease, large pleural  effusion or pneumothorax. IMPRESSION: No acute findings or explanation for shortness of breath. Electronically Signed   By: Shawna Orleans  Sanford M.D.   On: 05/12/2022 13:51   CARDIAC CATHETERIZATION  Result Date: 05/11/2022 1.  Total occlusion of the native RCA 2.  Patent left main and LAD with mild diffuse nonobstructive plaquing, no significant changes from recent cardiac catheterization study 3.  Patent left circumflex and intermediate branch with diffuse plaquing and distal vessel disease but no significant proximal stenosis 4.  Known occlusion of diagonal and obtuse marginal saphenous vein grafts 5.  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  Severe saphenous vein graft to RCA stenoses, treated with drug-eluting stent implantation (3.0 x 24 mm Synergy DES in distal body of graft, overlapping 3.0 x 32 mm and 3.0 x 16 mm Synergy DES in proximal body of graft within the previously implanted stent) Recommendations: Long-term DAPT with aspirin and clopidogrel as tolerated.        Scheduled Meds:  amLODipine  2.5 mg Oral Daily   arformoterol  15 mcg Nebulization BID   And   umeclidinium bromide  1 puff Inhalation Daily   aspirin  81 mg Oral Daily   clopidogrel  75 mg Oral Q breakfast   cyanocobalamin  1,000 mcg Intramuscular Daily   furosemide  20 mg Oral Daily   isosorbide mononitrate  60 mg Oral BID   nicotine  21 mg Transdermal Daily   pantoprazole  40 mg Oral BID   rosuvastatin  20 mg Oral Daily   sodium chloride flush  3 mL Intravenous Q12H   sodium chloride flush  3 mL Intravenous Q12H   sucralfate  1 g Oral BID   Continuous Infusions:  sodium chloride     sodium chloride       LOS: 7 days    Time spent: over 30 min    Lacretia Nicks, MD [facility]   To contact the attending provider between 7A-7P or the covering provider during after hours 7P-7A, please log into the web site [email or web address] and access using universal [place] password for that web site. If you do not have  the password, please call the hospital operator.  05/12/2022, 1:57 PM

## 2022-05-13 ENCOUNTER — Encounter (HOSPITAL_COMMUNITY): Payer: Self-pay | Admitting: Cardiovascular Disease

## 2022-05-13 DIAGNOSIS — E782 Mixed hyperlipidemia: Secondary | ICD-10-CM

## 2022-05-13 LAB — CBC WITH DIFFERENTIAL/PLATELET
Abs Immature Granulocytes: 0.02 10*3/uL (ref 0.00–0.07)
Basophils Absolute: 0 10*3/uL (ref 0.0–0.1)
Basophils Relative: 0 %
Eosinophils Absolute: 0.2 10*3/uL (ref 0.0–0.5)
Eosinophils Relative: 3 %
HCT: 29 % — ABNORMAL LOW (ref 36.0–46.0)
Hemoglobin: 9.3 g/dL — ABNORMAL LOW (ref 12.0–15.0)
Immature Granulocytes: 0 %
Lymphocytes Relative: 36 %
Lymphs Abs: 2.3 10*3/uL (ref 0.7–4.0)
MCH: 31.2 pg (ref 26.0–34.0)
MCHC: 32.1 g/dL (ref 30.0–36.0)
MCV: 97.3 fL (ref 80.0–100.0)
Monocytes Absolute: 0.4 10*3/uL (ref 0.1–1.0)
Monocytes Relative: 7 %
Neutro Abs: 3.4 10*3/uL (ref 1.7–7.7)
Neutrophils Relative %: 54 %
Platelets: 190 10*3/uL (ref 150–400)
RBC: 2.98 MIL/uL — ABNORMAL LOW (ref 3.87–5.11)
RDW: 12.2 % (ref 11.5–15.5)
WBC: 6.3 10*3/uL (ref 4.0–10.5)
nRBC: 0 % (ref 0.0–0.2)

## 2022-05-13 LAB — COMPREHENSIVE METABOLIC PANEL
ALT: 13 U/L (ref 0–44)
AST: 14 U/L — ABNORMAL LOW (ref 15–41)
Albumin: 2.9 g/dL — ABNORMAL LOW (ref 3.5–5.0)
Alkaline Phosphatase: 43 U/L (ref 38–126)
Anion gap: 7 (ref 5–15)
BUN: 13 mg/dL (ref 8–23)
CO2: 25 mmol/L (ref 22–32)
Calcium: 8.9 mg/dL (ref 8.9–10.3)
Chloride: 105 mmol/L (ref 98–111)
Creatinine, Ser: 1.02 mg/dL — ABNORMAL HIGH (ref 0.44–1.00)
GFR, Estimated: 57 mL/min — ABNORMAL LOW (ref >60)
Glucose, Bld: 127 mg/dL — ABNORMAL HIGH (ref 70–99)
Potassium: 4.3 mmol/L (ref 3.5–5.1)
Sodium: 137 mmol/L (ref 135–145)
Total Bilirubin: 0.4 mg/dL (ref 0.3–1.2)
Total Protein: 5.4 g/dL — ABNORMAL LOW (ref 6.5–8.1)

## 2022-05-13 LAB — MAGNESIUM: Magnesium: 1.9 mg/dL (ref 1.7–2.4)

## 2022-05-13 LAB — PHOSPHORUS: Phosphorus: 3.8 mg/dL (ref 2.5–4.6)

## 2022-05-13 LAB — TROPONIN I (HIGH SENSITIVITY): Troponin I (High Sensitivity): 117 ng/L (ref <18)

## 2022-05-13 MED ORDER — ORAL CARE MOUTH RINSE: 15.0000 mL | OROMUCOSAL | Status: DC | PRN

## 2022-05-13 NOTE — Evaluation (Signed)
Occupational Therapy Evaluation Patient Details Name: Karen [name] MRN: [id number] DOB: May 05, 1945 Today's Date: 05/13/2022   History of Present Illness Pt is a 77 y.o. F who presents 05/05/2022 with painless hematochezia. Active bleeding identified on CT angiogram. Underwent embolization of the distal left colic/sigmoid branch artery with resolution of bleeding. Hemoglobin stabilized. Developed chest pain and ECG changes consistent with STEMI on the morning of 1/10.  Underwent PCI. 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   Clinical Impression   Karen [name] was evaluated s/p the above admission list, she is mod I at baseline and cares for her son. Upon evaluation pt had limitations due to generalized weakness, decreased activity tolerance and unsteady gait. Overall she required min A for transfers and mobility with RW and presents with a slow deliberate gait. Due to the deficits listed below, she also requires up to max A for LB ADLs.  SpO2 stable on RA, HR to 125 with activity, resting at 100s. OT to continue to follow acutely. Recommend d/c to AIR for maximal functional progress towards baseline.      Recommendations for follow up therapy are one component of a multi-disciplinary discharge planning process, led by the attending physician.  Recommendations may be updated based on patient status, additional functional criteria and insurance authorization.   Follow Up Recommendations  Acute inpatient rehab (3hours/day)     Assistance Recommended at Discharge Frequent or constant Supervision/Assistance  Patient can return home with the following A little help with walking and/or transfers;A lot of help with bathing/dressing/bathroom;Assistance with cooking/housework;Direct supervision/assist for medications management;Direct supervision/assist for financial management;Assist for transportation;Help with stairs or ramp for entrance    Functional Status  Assessment  Patient has had a recent decline in their functional status and demonstrates the ability to make significant improvements in function in a reasonable and predictable amount of time.  Equipment Recommendations  Other (comment) (defer)    Recommendations for Other Services Rehab consult     Precautions / Restrictions Precautions Precautions: Fall Restrictions Weight Bearing Restrictions: No      Mobility Bed Mobility Overal bed mobility: Needs Assistance Bed Mobility: Supine to Sit     Supine to sit: Supervision     General bed mobility comments: Increased time    Transfers Overall transfer level: Needs assistance Equipment used: Rolling walker (2 wheels) Transfers: Sit to/from Stand Sit to Stand: Min assist                  Balance Overall balance assessment: Needs assistance Sitting-balance support: Feet supported Sitting balance-Leahy Scale: Fair     Standing balance support: Bilateral upper extremity supported Standing balance-Leahy Scale: Poor                             ADL either performed or assessed with clinical judgement   ADL Overall ADL's : Needs assistance/impaired Eating/Feeding: Set up;Sitting   Grooming: Set up;Sitting   Upper Body Bathing: Minimal assistance;Sitting   Lower Body Bathing: Maximal assistance;Sit to/from stand   Upper Body Dressing : Minimal assistance;Sitting   Lower Body Dressing: Maximal assistance;Sit to/from stand   Toilet Transfer: Minimal assistance;Ambulation;Rolling walker (2 wheels);Comfort height toilet   Toileting- Clothing Manipulation and Hygiene: Maximal assistance;Sit to/from stand       Functional mobility during ADLs: Minimal assistance;Rolling walker (2 wheels) General ADL Comments: increased time needed for all tasks     Vision Baseline  Vision/History: 0 No visual deficits Vision Assessment?: No apparent visual deficits     Perception Perception Perception Tested?:  No   Praxis Praxis Praxis tested?: Not tested    Pertinent Vitals/Pain Pain Assessment Pain Assessment: Faces Faces Pain Scale: Hurts a little bit Pain Location: generalized Pain Descriptors / Indicators: Grimacing, Guarding Pain Intervention(s): Limited activity within patient's tolerance, Monitored during session     Hand Dominance Right   Extremity/Trunk Assessment Upper Extremity Assessment Upper Extremity Assessment: Generalized weakness   Lower Extremity Assessment Lower Extremity Assessment: Generalized weakness   Cervical / Trunk Assessment Cervical / Trunk Assessment: Kyphotic   Communication Communication Communication: No difficulties   Cognition Arousal/Alertness: Awake/alert Behavior During Therapy: WFL for tasks assessed/performed Overall Cognitive Status: Within Functional Limits for tasks assessed                                 General Comments: likely baseline, overall WFL     General Comments  VSS on RA            Home Living Family/patient expects to be discharged to:: Private residence Living Arrangements: Children Available Help at Discharge: Family Type of Home: House Home Access: Stairs to enter Secretary/administrator of Steps: 4 Entrance Stairs-Rails: Right;Left Home Layout: One level     Bathroom Shower/Tub: Tub/shower unit         Home Equipment: Cane - single point;Rollator (4 wheels);Shower seat          Prior Functioning/Environment Prior Level of Function : Independent/Modified Independent             Mobility Comments: using cane ADLs Comments: cares for son (meds, meals, ADLs)        OT Problem List: Decreased strength;Decreased range of motion;Decreased activity tolerance;Decreased safety awareness      OT Treatment/Interventions: Self-care/ADL training;Therapeutic exercise;DME and/or AE instruction;Therapeutic activities;Patient/family education;Balance training    OT Goals(Current  goals can be found in the care plan section) Acute Rehab OT Goals Patient Stated Goal: to get back home to her son OT Goal Formulation: With patient Time For Goal Achievement: 05/27/22 Potential to Achieve Goals: Good ADL Goals Pt Will Perform Grooming: with supervision;standing Pt Will Perform Upper Body Dressing: with modified independence;sitting Pt Will Perform Lower Body Dressing: with min guard assist;sit to/from stand Pt Will Transfer to Toilet: with supervision;ambulating  OT Frequency: Min 2X/week       AM-PAC OT "6 Clicks" Daily Activity     Outcome Measure Help from another person eating meals?: A Little Help from another person taking care of personal grooming?: A Little Help from another person toileting, which includes using toliet, bedpan, or urinal?: A Lot Help from another person bathing (including washing, rinsing, drying)?: A Lot Help from another person to put on and taking off regular upper body clothing?: A Little Help from another person to put on and taking off regular lower body clothing?: A Lot 6 Click Score: 15   End of Session Equipment Utilized During Treatment: Rolling walker (2 wheels) Nurse Communication: Mobility status  Activity Tolerance: Patient tolerated treatment well;Patient limited by fatigue Patient left: in chair;with call bell/phone within reach  OT Visit Diagnosis: Unsteadiness on feet (R26.81);Other abnormalities of gait and mobility (R26.89);Muscle weakness (generalized) (M62.81)                Time: 9147-6075 OT Time Calculation (min): 18 min Charges:  OT General Charges $OT Visit: 1 Visit  OT Evaluation $OT Eval Moderate Complexity: 1 Mod   Lorry Furber D Causey 05/13/2022, 3:21 PM

## 2022-05-13 NOTE — Progress Notes (Signed)
Inpatient Rehab Admissions Coordinator:    I met with pt. And daughter regarding potential CIR admit. They are interested, state that family can provide 24/7 support at d/c. Will need to confirm with son, Audelia Acton, that he can provide care as he is likley to be primary caregiver and he was not in the room for discussion. Await OT notes and recommendations, but will open case with insurance and pursue for auth once it is in.   Megan Salon, MS, CCC-SLP Rehab Admissions Coordinator  [phone] (celll) [phone] (office)

## 2022-05-13 NOTE — Plan of Care (Signed)
  Problem: Activity: Goal: Ability to return to baseline activity level will improve Outcome: Progressing   Problem: Education: Goal: Knowledge of General Education information will improve Description: Including pain rating scale, medication(s)/side effects and non-pharmacologic comfort measures Outcome: Progressing   Problem: Health Behavior/Discharge Planning: Goal: Ability to manage health-related needs will improve Outcome: Progressing   Problem: Clinical Measurements: Goal: Ability to maintain clinical measurements within normal limits will improve Outcome: Progressing Goal: Will remain free from infection Outcome: Progressing Goal: Diagnostic test results will improve Outcome: Progressing Goal: Respiratory complications will improve Outcome: Progressing Goal: Cardiovascular complication will be avoided Outcome: Progressing   Problem: Activity: Goal: Risk for activity intolerance will decrease Outcome: Progressing   Problem: Nutrition: Goal: Adequate nutrition will be maintained Outcome: Progressing   Problem: Pain Managment: Goal: General experience of comfort will improve Outcome: Progressing   Problem: Safety: Goal: Ability to remain free from injury will improve Outcome: Progressing   Problem: Skin Integrity: Goal: Risk for impaired skin integrity will decrease Outcome: Progressing

## 2022-05-13 NOTE — Progress Notes (Signed)
Patient put her call light on to report sudden acute chest pain. Pain is left sided, upper. She said it feels similar to pain she had earlier this stay, that did result in cath lab visit with stent placement as well as a return to cath lab where additional stents were placed. Patient shows obvious signs of distress, grimacing and at time moaning.  EKG was obtained at bedside. Vitals stable. Two nitro glycerin administered to patient. Patient stated pain 7/10. Shortly after first nitro was given, pain began to subside. 5 minutes after nitro, pain was at 5/10. Another nitro was given, and pain went to 0/10.  After second nitro given, patient became suddenly quiet and very sleepy. Remained easy to wake but very sleepy and continues to fall back asleep. For a brief moment, when waking patient to talk to her, she appeared to have some brief involuntary eyelid fluttering. There was also a short period of time when patient was just staring blankly forward while not seeming to be visually focused on anything in particular. Rapid nurse was called to come to bedside and assess.   Cardiology on call MD Dr. Okey Dupre was paged and alerted to situation. Troponin ordered. Continue to monitor.

## 2022-05-13 NOTE — Progress Notes (Signed)
Mobility Specialist Progress Note   05/13/22 1100  Mobility  Activity Ambulated with assistance in hallway;Dangled on edge of bed  Level of Assistance Minimal assist, patient does 75% or more  Assistive Device Front wheel walker  Distance Ambulated (ft) 25 ft  Range of Motion/Exercises Active;All extremities  Activity Response Tolerated well   Pre Ambulation: HR 105 During Ambulation: HR 128 Post Ambulation: HR 110  Patient received in supine and agreeable to participate. Was independent for bed mobility and stood with min A. Required tactile cues for hand placement and verbal cues to stand upright. Ambulated min A with slow shuffle gait. Needed x3 extended standing rest breaks secondary to RLE pain and BUE fatigue. Returned to room without incident. Was left dangling EOB with all needs met, call bell in reach.   [place] Ammarie Matsuura, BS EXP Mobility Specialist Please contact via SecureChat or Rehab office at [phone]

## 2022-05-13 NOTE — Progress Notes (Signed)
CARDIAC REHAB PHASE I   Pt resting in bed eating snack. Feeling well today, reports generalized weakness and fatigue. Pt has been oob 2 times today, ambulated in hall once and in room once. Pt states that she gets tired quickly with mobility. Encouraged continued ambulation and oob to chair. Reviewed MI education provided last week. All questions and concerns addressed. Pt has referral in place for CIR . Will continue to follow.   [id number] Woodroe Chen, RN BSN 05/13/2022 1:48 PM

## 2022-05-13 NOTE — Progress Notes (Signed)
Rounding Note    Patient Name: Karen [name] Date of Encounter: 05/13/2022  [facility] Cardiologist: Lance Muss, MD   Subjective   Has had more chest pain.  Troponin trending down.  Had a twinge of chest pain while I was in the room.  It lasted just a second and resolved spontaneously.  Inpatient Medications    Scheduled Meds:  amLODipine  2.5 mg Oral Daily   arformoterol  15 mcg Nebulization BID   And   umeclidinium bromide  1 puff Inhalation Daily   aspirin  81 mg Oral Daily   clopidogrel  75 mg Oral Q breakfast   cyanocobalamin  1,000 mcg Intramuscular Daily   furosemide  20 mg Oral Daily   isosorbide mononitrate  60 mg Oral BID   magnesium oxide  400 mg Oral Daily   nicotine  21 mg Transdermal Daily   pantoprazole  40 mg Oral BID   polyethylene glycol  17 g Oral Daily   rosuvastatin  20 mg Oral Daily   sodium chloride flush  3 mL Intravenous Q12H   sodium chloride flush  3 mL Intravenous Q12H   sucralfate  1 g Oral BID   Continuous Infusions:  sodium chloride     sodium chloride     PRN Meds: sodium chloride, sodium chloride, acetaminophen, acetaminophen, albuterol, diazepam, nitroGLYCERIN, oxyCODONE-acetaminophen, sodium chloride flush, sodium chloride flush   Vital Signs    Vitals:   05/12/22 2214 05/12/22 2234 05/12/22 2300 05/13/22 0330  BP: 129/65 97/64  101/64  Pulse:  100 92 82  Resp:  (!) 0 (!) 21 19  Temp: 97.9 F (36.6 C)   97.9 F (36.6 C)  TempSrc: Oral   Oral  SpO2:  99% 100% 100%  Weight:      Height:        Intake/Output Summary (Last 24 hours) at 05/13/2022 0742 Last data filed at 05/12/2022 1153 Gross per 24 hour  Intake 240 ml  Output 600 ml  Net -360 ml      05/06/2022    8:55 PM 04/23/2022    9:10 AM 03/28/2022   11:07 AM  Last 3 Weights  Weight (lbs) 154 lb 5.2 oz 153 lb 153 lb  Weight (kg) 70 kg 69.4 kg 69.4 kg      Telemetry    Normal sinus rhythm- Personally Reviewed  ECG    From yesterday,  normal sinus rhythm with nonspecific ST-T wave changes  Physical Exam   GEN: No acute distress.   Neck: No JVD Cardiac: RRR, no murmurs, rubs, or gallops.  Respiratory: Clear to auscultation bilaterally. GI: Soft, nontender, non-distended  MS: No edema; No deformity.  Right groin without hematoma Neuro:  Nonfocal  Psych: Normal affect   Labs    High Sensitivity Troponin:   Recent Labs  Lab 05/08/22 1543 05/09/22 0617 05/11/22 0942 05/11/22 1607 05/12/22 2259  TROPONINIHS 2,614* 1,187* 313* 294* 117*     Chemistry Recent Labs  Lab 05/11/22 0013 05/12/22 0021 05/13/22 0020  NA 137 136 137  K 3.9 3.6 4.3  CL 107 104 105  CO2 22 26 25   GLUCOSE 162* 141* 127*  BUN 15 11 13   CREATININE 0.97 0.93 1.02*  CALCIUM 8.4* 8.4* 8.9  MG 1.8 1.7 1.9  PROT 5.1* 5.2* 5.4*  ALBUMIN 2.7* 2.8* 2.9*  AST 18 19 14*  ALT 11 13 13   ALKPHOS 38 51 43  BILITOT 0.4 0.3 0.4  GFRNONAA >60 >60 57*  ANIONGAP 8 6 7     Lipids  Recent Labs  Lab 05/10/22 0038  CHOL 91  TRIG 48  HDL 36*  LDLCALC 45  CHOLHDL 2.5    Hematology Recent Labs  Lab 05/11/22 0013 05/12/22 0021 05/13/22 0020  WBC 5.2 5.7 6.3  RBC 2.84* 2.90* 2.98*  HGB 9.0* 9.3* 9.3*  HCT 27.6* 27.9* 29.0*  MCV 97.2 96.2 97.3  MCH 31.7 32.1 31.2  MCHC 32.6 33.3 32.1  RDW 12.3 12.2 12.2  PLT 141* 167 190   Thyroid No results for input(s): "TSH", "FREET4" in the last 168 hours.  BNPNo results for input(s): "BNP", "PROBNP" in the last 168 hours.  DDimer No results for input(s): "DDIMER" in the last 168 hours.   Radiology    DG CHEST PORT 1 VIEW  Result Date: 05/12/2022 CLINICAL DATA:  Shortness of breath. EXAM: PORTABLE CHEST 1 VIEW COMPARISON:  Radiograph 05/05/2022 FINDINGS: Prior median sternotomy. Left-sided pacemaker remains in place. Coronary stent is visualized. The heart is normal in size. Stable mediastinal contours. Aortic atherosclerosis. No pulmonary edema, focal airspace disease, large pleural effusion  or pneumothorax. IMPRESSION: No acute findings or explanation for shortness of breath. Electronically Signed   By: 07/04/2022 M.D.   On: 05/12/2022 13:51   CARDIAC CATHETERIZATION  Result Date: 05/11/2022 1.  Total occlusion of the native RCA 2.  Patent left main and LAD with mild diffuse nonobstructive plaquing, no significant changes from recent cardiac catheterization study 3.  Patent left circumflex and intermediate branch with diffuse plaquing and distal vessel disease but no significant proximal stenosis 4.  Known occlusion of diagonal and obtuse marginal saphenous vein grafts 5.  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  Severe saphenous vein graft to RCA stenoses, treated with drug-eluting stent implantation (3.0 x 24 mm Synergy DES in distal body of graft, overlapping 3.0 x 32 mm and 3.0 x 16 mm Synergy DES in proximal body of graft within the previously implanted stent) Recommendations: Long-term DAPT with aspirin and clopidogrel as tolerated.    Cardiac Studies   Cath films personally reviewed  Patient Profile     77 y.o. female with prior CAD, MI , cardiac arrest in 2008, AICD.  Inferior STEMI s/p DES to SVG to RCA.   Assessment & Plan    CAD: s/p PCI to SVG to RCA.  Doing well.  Having some atypical chest pain but I do not think this is angina.  Continue aggressive secondary prevention.  Continue dual antiplatelet therapy.  Given her GI bleeding issues, would stop aspirin after 30 days.  Could continue clopidogrel monotherapy after that time.  If she were to have bleeding issues sooner, could stop aspirin sooner.  Hyperlipidemia: Continue high-dose rosuvastatin.  Lower GI bleed: Had embolization.  Plavix was held for a few days.   Tobacco abuse: She needs to stop smoking.  I think her biggest issue now will be getting stronger.  The family mentioned that inpatient rehab has been suggested.  I think she is stable from a cardiac standpoint.         For questions or updates, please  contact [facility] Please consult [email or web address] for contact info under        Signed, 2009, MD  05/13/2022, 7:42 AM

## 2022-05-13 NOTE — Progress Notes (Signed)
[facility] PROGRESS NOTE  Karen [name] (DOB: May 20, 1945) VQD:[id number] PCP: [facility]  Brief Narrative: Karen [name] is a 77 y.o. 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 Hemoglobin down from normal 12.9 to 10g/dl and subsequently 8.8 with active bleeding identified on CT angiogram. Underwent embolization of the distal left colic/sigmoid branch artery with resolution of bleeding. Hemoglobin has stabilized. Developed inferolateral wall STEMI on 1/10 now s/p DES and recurrent stenting on 1/13 for recurrent chest pain.   Subjective: Some chest discomfort overnight that has resolved. Hasn't been getting up very much and is very weak when she does. Has 24 hours support at home. CIR consulted.   Objective: BP 107/68 (BP Location: Right Arm)   Pulse 91   Temp 98 F (36.7 C) (Oral)   Resp 16   Ht 5\' 3"  (1.6 m)   Wt 70 kg   SpO2 100%   BMI 27.34 kg/m   Gen: Elderly female in no distress Pulm: Clear ,nonlabored  CV: RRR, no MRG, no pitting edema GI: Soft, NT, ND, +BS  Neuro: Alert and oriented. No new focal deficits. Ext: Warm, no deformities Skin: Nicotine patch in place, no new rashes, lesions or ulcers on visualized skin   Assessment & Plan: Principal Problem:   Lower GI bleed Active Problems:   Adenocarcinoma, lung (HCC)   Tobacco use   Essential hypertension   Coronary artery disease involving native heart with angina pectoris (HCC)   VENTRICULAR FIBRILLATION   Stage 2 moderate COPD by GOLD classification (HCC)   ST elevation myocardial infarction involving right coronary artery (HCC)   Chest pain   Prolonged QT interval   Acute ST elevation myocardial infarction (STEMI) of inferolateral wall (HCC)  Inferolateral STEMI, CAD, hx CABG: s/p DES to SVG to RCA.  - LHC 1/10 with high-grade in-stent stenosis prox SVG to  distal RCA stented, repeat 1/13 with more distal DES placement.  - Continue indefinite plavix, plan for 30 days ASA in light of her recent bleeding.  - Continue imdur, high intensity statin - prn NTG  - Not on beta blocker, defer this to cardiology, BP somewhat soft.  Acute blood loss anemia due to diverticular GI bleed: s/p distal left colic/sigmoid artery branch vessel embolization by Dr. 2/13 - s/p 1u PRBC 1/7. Hgb improved and stabilized. Will continue antiplatelets as above.    Thrombocytopenia: Resolved.   History of CHF with recovered EF, HTN: LVEF 50-55%.  - BP normalized and patient's home medications were restarted.   Hx VF arrest s/p AICD. Keep K and Mg replete.    Hypokalemia - Supplemented, resolved durably.   COPD: Quiescent.  - Continue home meds   History of left lung CA s/p SBRT 2022.    Stage IIIa/b CKD: Based on baseline CrCl hovering right at 38ml/min  - Monitor BMP intermittently.   HLD:  - Continue high-intensity statin   Vitamin B12 deficiency: Marginal B12 level and macrocytosis noted.  - Continue supplement   Tobacco use:  - Cessation counseling provided - Nicotine patch while admitted   GERD, chronic dysphagia:  - Continue with plans for EGD with [facility].   Deconditioning:  - CIR admission pending.  48m, MD [facility] [email or web address] 05/13/2022, 2:16 PM

## 2022-05-14 ENCOUNTER — Other Ambulatory Visit: Payer: Self-pay | Admitting: Interventional Cardiology

## 2022-05-14 NOTE — Progress Notes (Signed)
Physical Therapy Treatment Patient Details Name: Karen [name] MRN: [id number] DOB: 08-23-1945 Today's Date: 05/14/2022   History of Present Illness Pt is a 77 y.o. F who presents 05/05/2022 with painless hematochezia. Active bleeding identified on CT angiogram. Underwent embolization of the distal left colic/sigmoid branch artery with resolution of bleeding. Hemoglobin stabilized. Developed chest pain and ECG changes consistent with STEMI on the morning of 1/10.  Underwent PCI. 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    PT Comments    Pt progressing towards her physical therapy goals and remains motivated to participate. Denies chest pain at rest or with activity. Pt able to participate in bed level exercises for BLE strengthening and then ambulate ~50 ft with a Rollator; requires several standing rest breaks due to upper body fatigue. Pt continues to demonstrates generalized weakness, decreased activity tolerance, gait abnormalities and impaired balance. Continue to recommend acute inpatient rehab (AIR) for post-acute therapy needs.    Recommendations for follow up therapy are one component of a multi-disciplinary discharge planning process, led by the attending physician.  Recommendations may be updated based on patient status, additional functional criteria and insurance authorization.  Follow Up Recommendations  Acute inpatient rehab (3hours/day)     Assistance Recommended at Discharge Frequent or constant Supervision/Assistance  Patient can return home with the following A lot of help with walking and/or transfers;A lot of help with bathing/dressing/bathroom;Assistance with cooking/housework;Assist for transportation;Help with stairs or ramp for entrance   Equipment Recommendations  BSC/3in1;Wheelchair (measurements PT);Wheelchair cushion (measurements PT)    Recommendations for Other Services       Precautions / Restrictions  Precautions Precautions: Fall Restrictions Weight Bearing Restrictions: No     Mobility  Bed Mobility Overal bed mobility: Needs Assistance Bed Mobility: Supine to Sit, Sit to Supine     Supine to sit: Supervision Sit to supine: Min assist   General bed mobility comments: MinA for LE negotiation back into bed    Transfers Overall transfer level: Needs assistance Equipment used: Rollator (4 wheels) Transfers: Sit to/from Stand Sit to Stand: Mod assist           General transfer comment: ModA to rise from edge of bed, cues for hand placement    Ambulation/Gait Ambulation/Gait assistance: Min assist Gait Distance (Feet): 50 Feet Assistive device: Rollator (4 wheels) Gait Pattern/deviations: Step-through pattern, Decreased stride length, Decreased weight shift to right, Decreased dorsiflexion - right, Wide base of support Gait velocity: decreased Gait velocity interpretation: <1.8 ft/sec, indicate of risk for recurrent falls   General Gait Details: Slightly improved gait speed initially, however, slows with fatigue. Pt requiring 2-3 extended standing rest breaks, leaning down onto elbows on Rollator. Mild RLE circumduction, wider BOS   Stairs             Wheelchair Mobility    Modified Rankin (Stroke Patients Only)       Balance Overall balance assessment: Needs assistance Sitting-balance support: Feet supported Sitting balance-Leahy Scale: Fair     Standing balance support: Bilateral upper extremity supported Standing balance-Leahy Scale: Poor Standing balance comment: heavily reliant through BUE's                            Cognition Arousal/Alertness: Awake/alert Behavior During Therapy: WFL for tasks assessed/performed Overall Cognitive Status: Within Functional Limits for tasks assessed  Exercises General Exercises - Lower Extremity Heel Slides: Both, 5 reps,  Supine Straight Leg Raises: Both, 5 reps, Supine Other Exercises Other Exercises: Supine: bridging x 5    General Comments        Pertinent Vitals/Pain Pain Assessment Pain Assessment: Faces Faces Pain Scale: Hurts little more Pain Location: R hip Pain Descriptors / Indicators: Grimacing, Guarding Pain Intervention(s): Limited activity within patient's tolerance, Monitored during session    Home Living                          Prior Function            PT Goals (current goals can now be found in the care plan section) Acute Rehab PT Goals Patient Stated Goal: go home Potential to Achieve Goals: Good Progress towards PT goals: Progressing toward goals    Frequency    Min 3X/week      PT Plan Current plan remains appropriate    Co-evaluation              AM-PAC PT "6 Clicks" Mobility   Outcome Measure  Help needed turning from your back to your side while in a flat bed without using bedrails?: A Little Help needed moving from lying on your back to sitting on the side of a flat bed without using bedrails?: A Little Help needed moving to and from a bed to a chair (including a wheelchair)?: A Little Help needed standing up from a chair using your arms (e.g., wheelchair or bedside chair)?: A Lot Help needed to walk in hospital room?: A Little Help needed climbing 3-5 steps with a railing? : Total 6 Click Score: 15    End of Session Equipment Utilized During Treatment: Gait belt Activity Tolerance: Patient tolerated treatment well Patient left: in bed;with call bell/phone within reach;with family/visitor present Nurse Communication: Mobility status PT Visit Diagnosis: Unsteadiness on feet (R26.81);Other abnormalities of gait and mobility (R26.89);Muscle weakness (generalized) (M62.81);Difficulty in walking, not elsewhere classified (R26.2)     Time: 2178-3754 PT Time Calculation (min) (ACUTE ONLY): 25 min  Charges:  $Therapeutic Activity:  23-37 mins                     Lillia Pauls, PT, DPT Acute Rehabilitation Services Office [phone]    Norval Morton 05/14/2022, 2:31 PM

## 2022-05-14 NOTE — Progress Notes (Signed)
[facility] PROGRESS NOTE  Karen [name] (DOB: 1945/12/18) CBN:[id number] PCP: [facility]  Brief Narrative: Karen [name] is a 77 y.o. 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 Hemoglobin down from normal 12.9 to 10g/dl and subsequently 8.8 with active bleeding identified on CT angiogram. Underwent embolization of the distal left colic/sigmoid branch artery with resolution of bleeding. Hemoglobin has stabilized. Developed inferolateral wall STEMI on 1/10 now s/p DES and recurrent stenting on 1/13 for recurrent chest pain.   Subjective: No chest pain or dyspnea, working with therapy, eager for rehab. No bleeding in stools. Eating ok.  Objective: BP 102/66 (BP Location: Right Arm)   Pulse 96   Temp 98.4 F (36.9 C) (Oral)   Resp (!) 22   Ht 5\' 3"  (1.6 m)   Wt 70 kg   SpO2 99%   BMI 27.34 kg/m   Gen: No distress Pulm: Clear, nonlabored  CV: RRR, no edema  GI: Soft, NT, ND, +BS  Neuro: Alert and oriented. No new focal deficits. Ext: Warm, no deformities Skin: No rashes, lesions or ulcers on visualized skin   Assessment & Plan: Principal Problem:   Lower GI bleed Active Problems:   Adenocarcinoma, lung (HCC)   Tobacco use   Essential hypertension   Coronary artery disease involving native heart with angina pectoris (HCC)   VENTRICULAR FIBRILLATION   Stage 2 moderate COPD by GOLD classification (HCC)   ST elevation myocardial infarction involving right coronary artery (HCC)   Chest pain   Prolonged QT interval   Acute ST elevation myocardial infarction (STEMI) of inferolateral wall (HCC)  Inferolateral STEMI, CAD, hx CABG: s/p DES to SVG to RCA.  - LHC 1/10 with high-grade in-stent stenosis prox SVG to distal RCA stented, repeat 1/13 with more distal DES placement.  - Continue indefinite plavix, plan for 30 days ASA in light  of her recent bleeding.  - Continue imdur, high intensity statin - prn NTG  - Not on beta blocker, defer this to cardiology, BP somewhat soft.  Acute blood loss anemia due to diverticular GI bleed: s/p distal left colic/sigmoid artery branch vessel embolization by Dr. 2/13 - s/p 1u PRBC 1/7. Hgb improved and stabilized. Will continue antiplatelets as above.    Thrombocytopenia: Resolved.   History of CHF with recovered EF, HTN: LVEF 50-55%.  - BP normalized and patient's home medications were restarted. Soft BP usually first thing in the morning, improves as she wakes up and gets around. Will continue monitoring.    Hx VF arrest s/p AICD. Keep K and Mg replete.    Hypokalemia - Supplemented, resolved durably.   COPD: Quiescent.  - Continue home meds   History of left lung CA s/p SBRT 2022.    Stage IIIa/b CKD: Based on baseline CrCl hovering right at 72ml/min  - Monitor BMP intermittently.   HLD:  - Continue high-intensity statin   Vitamin B12 deficiency: Marginal B12 level and macrocytosis noted.  - Continue supplement   Tobacco use:  - Cessation counseling provided - Nicotine patch while admitted   GERD, chronic dysphagia:  - Continue with plans for EGD with [facility].   Deconditioning:  - CIR admission pending.  48m, MD [facility] [email or web address] 05/14/2022, 11:57 AM

## 2022-05-14 NOTE — Progress Notes (Addendum)
Mobility Specialist Progress Note    05/14/22 0948  Mobility  Activity Ambulated with assistance in hallway  Level of Assistance Moderate assist, patient does 50-74%  Assistive Device Front wheel walker  Distance Ambulated (ft) 30 ft  Activity Response Tolerated fair  Mobility Referral Yes  $Mobility charge 1 Mobility   Pre-Mobility: 97 HR, 94/53 (66) BP, 97% SpO2 During Mobility: 115 HR Post-Mobility: 93 HR, 128/59 (79) BP, 97% SpO2  Pt received in bed and agreeable. C/o 3/10 chest pain and weakness. Returned to room in chair and pivoted to bed. Left supine w/ call bell in reach. RN notified.  [place] Mobility Specialist  Please Neurosurgeon or Rehab Office at [phone]

## 2022-05-14 NOTE — Progress Notes (Signed)
Inpatient Rehab Admissions Coordinator:   I am following for potential CIR admit. I opened case with pt.'s insurance this AM and await a response. I also called Benny, Pt.'s son who is likely to be her primary caregiver to discuss support at d/c and left VM with request for callback.   Megan Salon, MS, CCC-SLP Rehab Admissions Coordinator  [phone] (celll) [phone] (office)

## 2022-05-14 NOTE — PMR Pre-admission (Shared)
PMR Admission Coordinator Pre-Admission Assessment  Patient: Karen [name] is an 77 y.o., female MRN: [id number] DOB: 1946-02-06 Height: 5\' 3"  (160 cm) Weight: 70 kg  Insurance Information HMO: yes     PPO:      PCP:      IPA:      80/20:      OTHER:  PRIMARY:   [employer] Medicare     Policy#: [id number]      Subscriber: Pt.  CM Name: ***      Phone#: [phone]     Fax#: *** Pre-Cert#: [id number]      Employer:  Benefits:  Phone #:      Name:  Irene Shipper Date: 04/29/2022 - 04/29/2023 Deductible: $240 ($0 met) OOP Max: $3,710 ($0 met) CIR: $1,730 copay/ admission SNF: $0.00 Copayment per day for days 1-20; $204 Copayment per day for days 21-100; Maximum of 100 days/benefit period Outpatient: 80% coverage; 20% co-insurance Home Health:  100% coverage, limited by medical necessity DME: 80% coverage; 20% co-insurance Providers: in network  SECONDARY:       Policy#:      Phone#:    Development worker, community:       Phone#:   The Engineer, petroleum" for patients in Inpatient Rehabilitation Facilities with attached "Privacy Act [place] Records" was provided and verbally reviewed with: Patient  Emergency Contact Information Contact Information     Name Relation Home Work Mobile   Drake,Rebecca Granddaughter   [phone]   [facility] Daughter   [phone]   Lanette, Ell [phone]     [place] Granddaughter   [phone]       Current Medical History  Patient Admitting Diagnosis: Lower GI bleed History of Present Illness: Pt is a 77 y.o. 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  who presented to the [facility] ED  05/05/2022 with painless hematochezia. ctive bleeding identified on CT angiogram. Underwent embolization of the distal left colic/sigmoid branch artery with resolution of bleeding. Hemoglobin stabilized. Developed chest pain and ECG changes consistent with STEMI on the morning of 1/10.  Underwent PCI.  Pt. Seen by PT/OT/SLP and they recommended CIR to assist return to PLOF.     Patient's medical record from [facility] has been reviewed by the rehabilitation admission coordinator and physician.  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     Has the patient had major surgery during 100 days prior to admission? Yes  Family History   family history includes Breast cancer in her maternal aunt; Heart attack in her mother; Heart disease in her mother.  Current Medications  Current Facility-Administered Medications:    0.9 %  sodium chloride infusion, 250 mL, Intravenous, PRN, Sherren Mocha, MD   0.9 %  sodium chloride infusion, 250 mL, Intravenous, PRN, Sherren Mocha, MD   acetaminophen (TYLENOL) tablet 650 mg, 650 mg,  Oral, Q6H PRN, Sherren Mocha, MD, 650 mg at 05/07/22 2155   acetaminophen (TYLENOL) tablet 650 mg, 650 mg, Oral, Q4H PRN, Sherren Mocha, MD   albuterol (VENTOLIN HFA) 108 (90 Base) MCG/ACT inhaler 2 puff, 2 puff, Inhalation, Q6H PRN, Sherren Mocha, MD   amLODipine (NORVASC) tablet 2.5 mg, 2.5 mg, Oral, Daily, Sherren Mocha, MD, 2.5 mg at 05/13/22 1032   arformoterol (BROVANA) nebulizer solution 15 mcg, 15 mcg, Nebulization, BID, 15 mcg at 05/14/22 0912 **AND** umeclidinium bromide (INCRUSE ELLIPTA) 62.5 MCG/ACT 1 puff, 1 puff, Inhalation, Daily, Sherren Mocha, MD, 1 puff at 05/14/22 0912    aspirin chewable tablet 81 mg, 81 mg, Oral, Daily, Sherren Mocha, MD, 81 mg at 05/13/22 1033   clopidogrel (PLAVIX) tablet 75 mg, 75 mg, Oral, Q breakfast, Sherren Mocha, MD, 75 mg at 05/14/22 9528   diazepam (VALIUM) tablet 5 mg, 5 mg, Oral, Q8H PRN, Sherren Mocha, MD   furosemide (LASIX) tablet 20 mg, 20 mg, Oral, Daily, Sherren Mocha, MD, 20 mg at 05/13/22 1033   isosorbide mononitrate (IMDUR) 24 hr tablet 60 mg, 60 mg, Oral, BID, Sherren Mocha, MD, 60 mg at 05/13/22 2108   magnesium oxide (MAG-OX) tablet 400 mg, 400 mg, Oral, Daily, Elodia Florence., MD, 400 mg at 05/13/22 1033   nicotine (NICODERM CQ - dosed in mg/24 hours) patch 21 mg, 21 mg, Transdermal, Daily, Sherren Mocha, MD, 21 mg at 05/13/22 1035   nitroGLYCERIN (NITROSTAT) SL tablet 0.4 mg, 0.4 mg, Sublingual, Q5 min PRN, Sherren Mocha, MD, 0.4 mg at 05/12/22 2225   Oral care mouth rinse, 15 mL, Mouth Rinse, PRN, Patrecia Pour, MD   oxyCODONE-acetaminophen (PERCOCET) 7.5-325 MG per tablet 1-2 tablet, 1-2 tablet, Oral, Q6H PRN, Sherren Mocha, MD, 1 tablet at 05/11/22 2325   pantoprazole (PROTONIX) EC tablet 40 mg, 40 mg, Oral, BID, Sherren Mocha, MD, 40 mg at 05/13/22 2108   polyethylene glycol (MIRALAX / GLYCOLAX) packet 17 g, 17 g, Oral, Daily, Elodia Florence., MD, 17 g at 05/13/22 1032   rosuvastatin (CRESTOR) tablet 20 mg, 20 mg, Oral, Daily, Sherren Mocha, MD, 20 mg at 05/13/22 1032   sodium chloride flush (NS) 0.9 % injection 3 mL, 3 mL, Intravenous, Q12H, Sherren Mocha, MD, 3 mL at 05/13/22 1040   sodium chloride flush (NS) 0.9 % injection 3 mL, 3 mL, Intravenous, PRN, Sherren Mocha, MD   sodium chloride flush (NS) 0.9 % injection 3 mL, 3 mL, Intravenous, Q12H, Sherren Mocha, MD, 3 mL at 05/13/22 2108   sodium chloride flush (NS) 0.9 % injection 3 mL, 3 mL, Intravenous, PRN, Sherren Mocha, MD   sucralfate (CARAFATE) 1 GM/10ML suspension 1 g, 1 g, Oral, BID, Sherren Mocha, MD, 1 g at  05/13/22 2108  Patients Current Diet:  Diet Order             Diet Carb Modified Fluid consistency: Thin; Room service appropriate? Yes  Diet effective now                   Precautions / Restrictions Precautions Precautions: Fall Restrictions Weight Bearing Restrictions: No   Has the patient had 2 or more falls or a fall with injury in the past year? No  Prior Activity Level Community (5-7x/wk): Pt. active in the community PTA  Prior Functional Level Self Care: Did the patient need help bathing, dressing, using the toilet or eating? Independent  Indoor Mobility: Did the patient need assistance with walking from room to room (  with or without device)? Independent  Stairs: Did the patient need assistance with internal or external stairs (with or without device)? Independent  Functional Cognition: Did the patient need help planning regular tasks such as shopping or remembering to take medications? Independent  Patient Information Are you of Hispanic, Latino/a,or Spanish origin?: A. No, not of Hispanic, Latino/a, or Spanish origin What is your race?: B. Black or African American Do you need or want an interpreter to communicate with a doctor or health care staff?: 0. No  Patient's Response To:  Health Literacy and Transportation Is the patient able to respond to health literacy and transportation needs?: Yes Health Literacy - How often do you need to have someone help you when you read instructions, pamphlets, or other written material from your doctor or pharmacy?: Never In the past 12 months, has lack of transportation kept you from medical appointments or from getting medications?: No In the past 12 months, has lack of transportation kept you from meetings, work, or from getting things needed for daily living?: No  Journalist, newspaper / Equipment Home Assistive Devices/Equipment: None Home Equipment: Cane - single point, Rollator (4 wheels), Shower seat  Prior  Device Use: Indicate devices/aids used by the patient prior to current illness, exacerbation or injury?  SPC  Current Functional Level Cognition  Overall Cognitive Status: Within Functional Limits for tasks assessed Orientation Level: Oriented X4 General Comments: likely baseline, overall WFL    Extremity Assessment (includes Sensation/Coordination)  Upper Extremity Assessment: Generalized weakness  Lower Extremity Assessment: Generalized weakness    ADLs  Overall ADL's : Needs assistance/impaired Eating/Feeding: Set up, Sitting Grooming: Set up, Sitting Upper Body Bathing: Minimal assistance, Sitting Lower Body Bathing: Maximal assistance, Sit to/from stand Upper Body Dressing : Minimal assistance, Sitting Lower Body Dressing: Maximal assistance, Sit to/from stand Toilet Transfer: Minimal assistance, Ambulation, Rolling walker (2 wheels), Comfort height toilet Toileting- Clothing Manipulation and Hygiene: Maximal assistance, Sit to/from stand Functional mobility during ADLs: Minimal assistance, Rolling walker (2 wheels) General ADL Comments: increased time needed for all tasks    Mobility  Overal bed mobility: Needs Assistance Bed Mobility: Supine to Sit Supine to sit: Supervision General bed mobility comments: Increased time    Transfers  Overall transfer level: Needs assistance Equipment used: Rolling walker (2 wheels) Transfers: Sit to/from Stand Sit to Stand: Min assist General transfer comment: ModA to power up, increased time/effort    Ambulation / Gait / Stairs / Wheelchair Mobility  Ambulation/Gait Ambulation/Gait assistance: Editor, commissioning (Feet): 45 Feet Assistive device: Rollator (4 wheels) Gait Pattern/deviations: Step-through pattern, Decreased stride length, Decreased weight shift to right, Decreased dorsiflexion - right General Gait Details: Increased RLE circumduction, antalgic gait pattern. Pt requiring two extended standing rest breaks,  leaning down onto elbows on Rollator. Very slow and effortful gait. Gait velocity: decreased Gait velocity interpretation: <1.31 ft/sec, indicative of household ambulator    Posture / Balance Balance Overall balance assessment: Needs assistance Sitting-balance support: Feet supported Sitting balance-Leahy Scale: Fair Standing balance support: Bilateral upper extremity supported Standing balance-Leahy Scale: Poor Standing balance comment: heavily reliant through BUE's    Special needs/care consideration Skin *** and Special service needs ***   Previous Home Environment (from acute therapy documentation) Living Arrangements: Children  Lives With: Family Available Help at Discharge: Family Type of Home: House Home Layout: One level Home Access: Stairs to enter Entrance Stairs-Rails: Right, Left Entrance Stairs-Number of Steps: 4 Bathroom Shower/Tub: Engineer, manufacturing systems: Standard Bathroom Accessibility: Yes How  Accessible: Accessible via walker Home Care Services: No  Discharge Living Setting Plans for Discharge Living Setting: Patient's home Type of Home at Discharge: House Discharge Home Layout: One level Discharge Home Access: Stairs to enter Entrance Stairs-Rails: Right, Left Entrance Stairs-Number of Steps: 4 Discharge Bathroom Shower/Tub: Tub/shower unit Discharge Bathroom Toilet: Standard Discharge Bathroom Accessibility: Yes How Accessible: Accessible via walker Does the patient have any problems obtaining your medications?: No  Social/Family/Support Systems Patient Roles: Other (Comment) Contact Information: [phone] Anticipated Caregiver: 24/7 Anticipated Caregiver's Contact Information: Corrinne Eagle, son Ability/Limitations of Caregiver: Min A Caregiver Availability: 24/7 Discharge Plan Discussed with Primary Caregiver: Yes Is Caregiver In Agreement with Plan?: Yes Does Caregiver/Family have Issues with Lodging/Transportation while Pt is in  Rehab?: No  Goals Patient/Family Goal for Rehab: PT/OT Supervision Expected length of stay: 7-10 days Pt/Family Agrees to Admission and willing to participate: Yes Program Orientation Provided & Reviewed with Pt/Caregiver Including Roles  & Responsibilities: Yes  Decrease burden of Care through IP rehab admission: None   Possible need for SNF placement upon discharge: none  Patient Condition: I have reviewed medical records from [facility] , spoken with CM, and patient. I met with patient at the bedside for inpatient rehabilitation assessment.  Patient will benefit from ongoing PT and OT, can actively participate in 3 hours of therapy a day 5 days of the week, and can make measurable gains during the admission.  Patient will also benefit from the coordinated team approach during an Inpatient Acute Rehabilitation admission.  The patient will receive intensive therapy as well as Rehabilitation physician, nursing, social worker, and care management interventions.  Due to safety, skin/wound care, disease management, medication administration, pain management, and patient education the patient requires 24 hour a day rehabilitation nursing.  The patient is currently *** with mobility and basic ADLs.  Discharge setting and therapy post discharge at home with home health is anticipated.  Patient has agreed to participate in the Acute Inpatient Rehabilitation Program and will admit {Time; today/tomorrow:10263}.  Preadmission Screen Completed By:  Jeronimo Greaves, 05/14/2022 9:27 AM ______________________________________________________________________   Discussed status with Dr. Marland Kitchen on *** at *** and received approval for admission today.  Admission Coordinator:  Jeronimo Greaves, CCC-SLP, time Marland KitchenDorna Bloom ***   Assessment/Plan: Diagnosis: Does the need for close, 24 hr/day Medical supervision in concert with the patient's rehab needs make it unreasonable for this patient to be served in a  less intensive setting? {yes_no_potentially:3041433} Co-Morbidities requiring supervision/potential complications: *** Due to {due CO:2927726}, does the patient require 24 hr/day rehab nursing? {yes_no_potentially:3041433} Does the patient require coordinated care of a physician, rehab nurse, PT, OT, and SLP to address physical and functional deficits in the context of the above medical diagnosis(es)? {yes_no_potentially:3041433} Addressing deficits in the following areas: {deficits:3041436} Can the patient actively participate in an intensive therapy program of at least 3 hrs of therapy 5 days a week? {yes_no_potentially:3041433} The potential for patient to make measurable gains while on inpatient rehab is {potential:3041437} Anticipated functional outcomes upon discharge from inpatient rehab: {functional outcomes:304600100} PT, {functional outcomes:304600100} OT, {functional outcomes:304600100} SLP Estimated rehab length of stay to reach the above functional goals is: *** Anticipated discharge destination: {anticipated dc setting:21604} 10. Overall Rehab/Functional Prognosis: {potential:3041437}   MD Signature: ***

## 2022-05-14 NOTE — Progress Notes (Addendum)
Rounding Note    Patient Name: Karen [name] Date of Encounter: 05/14/2022  [facility] Cardiologist: Lance Muss, MD   Subjective   Feels well today.  No significant chest discomfort.  No blood in stool that she noted.  Inpatient Medications    Scheduled Meds:  amLODipine  2.5 mg Oral Daily   arformoterol  15 mcg Nebulization BID   And   umeclidinium bromide  1 puff Inhalation Daily   aspirin  81 mg Oral Daily   clopidogrel  75 mg Oral Q breakfast   furosemide  20 mg Oral Daily   isosorbide mononitrate  60 mg Oral BID   magnesium oxide  400 mg Oral Daily   nicotine  21 mg Transdermal Daily   pantoprazole  40 mg Oral BID   polyethylene glycol  17 g Oral Daily   rosuvastatin  20 mg Oral Daily   sodium chloride flush  3 mL Intravenous Q12H   sodium chloride flush  3 mL Intravenous Q12H   sucralfate  1 g Oral BID   Continuous Infusions:  sodium chloride     sodium chloride     PRN Meds: sodium chloride, sodium chloride, acetaminophen, acetaminophen, albuterol, diazepam, nitroGLYCERIN, mouth rinse, oxyCODONE-acetaminophen, sodium chloride flush, sodium chloride flush   Vital Signs    Vitals:   05/14/22 0329 05/14/22 0756 05/14/22 0912 05/14/22 1025  BP: 94/60 (!) 94/50  102/66  Pulse: 91 97 95 96  Resp: 20 (!) 21 13 (!) 22  Temp: 98 F (36.7 C) 98 F (36.7 C)  98.4 F (36.9 C)  TempSrc: Oral Oral  Oral  SpO2: 98% 98% 100% 99%  Weight:      Height:        Intake/Output Summary (Last 24 hours) at 05/14/2022 1119 Last data filed at 05/14/2022 1030 Gross per 24 hour  Intake 478 ml  Output 900 ml  Net -422 ml      05/06/2022    8:55 PM 04/23/2022    9:10 AM 03/28/2022   11:07 AM  Last 3 Weights  Weight (lbs) 154 lb 5.2 oz 153 lb 153 lb  Weight (kg) 70 kg 69.4 kg 69.4 kg      Telemetry    Normal sinus rhythm- Personally Reviewed  ECG      Physical Exam   GEN: No acute distress.   Neck: No JVD Cardiac: RRR, no murmurs, rubs,  or gallops.  Respiratory: Clear to auscultation bilaterally. GI: Soft, nontender, non-distended  MS: No edema; No deformity. Neuro:  Nonfocal  Psych: Normal affect   Labs    High Sensitivity Troponin:   Recent Labs  Lab 05/08/22 1543 05/09/22 0617 05/11/22 0942 05/11/22 1607 05/12/22 2259  TROPONINIHS 2,614* 1,187* 313* 294* 117*     Chemistry Recent Labs  Lab 05/11/22 0013 05/12/22 0021 05/13/22 0020  NA 137 136 137  K 3.9 3.6 4.3  CL 107 104 105  CO2 22 26 25   GLUCOSE 162* 141* 127*  BUN 15 11 13   CREATININE 0.97 0.93 1.02*  CALCIUM 8.4* 8.4* 8.9  MG 1.8 1.7 1.9  PROT 5.1* 5.2* 5.4*  ALBUMIN 2.7* 2.8* 2.9*  AST 18 19 14*  ALT 11 13 13   ALKPHOS 38 51 43  BILITOT 0.4 0.3 0.4  GFRNONAA >60 >60 57*  ANIONGAP 8 6 7     Lipids  Recent Labs  Lab 05/10/22 0038  CHOL 91  TRIG 48  HDL 36*  LDLCALC 45  CHOLHDL 2.5  Hematology Recent Labs  Lab 05/11/22 0013 05/12/22 0021 05/13/22 0020  WBC 5.2 5.7 6.3  RBC 2.84* 2.90* 2.98*  HGB 9.0* 9.3* 9.3*  HCT 27.6* 27.9* 29.0*  MCV 97.2 96.2 97.3  MCH 31.7 32.1 31.2  MCHC 32.6 33.3 32.1  RDW 12.3 12.2 12.2  PLT 141* 167 190   Thyroid No results for input(s): "TSH", "FREET4" in the last 168 hours.  BNPNo results for input(s): "BNP", "PROBNP" in the last 168 hours.  DDimer No results for input(s): "DDIMER" in the last 168 hours.   Radiology    No results found.  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  No issues with bleeding currently.  Hemoglobin had been stable over the past several days.  Hyperlipidemia: Continue rosuvastatin.  Awaiting rehab bed assignment.  She needs to stop smoking.      For questions or updates, please contact [facility] Please consult [email or web address] for contact info under        Signed, Lance Muss, MD  05/14/2022, 11:19 AM

## 2022-05-15 DIAGNOSIS — I2111 ST elevation (STEMI) myocardial infarction involving right coronary artery: Secondary | ICD-10-CM

## 2022-05-15 DIAGNOSIS — D649 Anemia, unspecified: Secondary | ICD-10-CM

## 2022-05-15 LAB — BASIC METABOLIC PANEL
Anion gap: 6 (ref 5–15)
BUN: 14 mg/dL (ref 8–23)
CO2: 27 mmol/L (ref 22–32)
Calcium: 8.9 mg/dL (ref 8.9–10.3)
Chloride: 103 mmol/L (ref 98–111)
Creatinine, Ser: 1.02 mg/dL — ABNORMAL HIGH (ref 0.44–1.00)
GFR, Estimated: 57 mL/min — ABNORMAL LOW (ref >60)
Glucose, Bld: 111 mg/dL — ABNORMAL HIGH (ref 70–99)
Potassium: 4.2 mmol/L (ref 3.5–5.1)
Sodium: 136 mmol/L (ref 135–145)

## 2022-05-15 LAB — CBC
HCT: 30.1 % — ABNORMAL LOW (ref 36.0–46.0)
Hemoglobin: 9.9 g/dL — ABNORMAL LOW (ref 12.0–15.0)
MCH: 32 pg (ref 26.0–34.0)
MCHC: 32.9 g/dL (ref 30.0–36.0)
MCV: 97.4 fL (ref 80.0–100.0)
Platelets: 241 10*3/uL (ref 150–400)
RBC: 3.09 MIL/uL — ABNORMAL LOW (ref 3.87–5.11)
RDW: 12 % (ref 11.5–15.5)
WBC: 5.3 10*3/uL (ref 4.0–10.5)
nRBC: 0 % (ref 0.0–0.2)

## 2022-05-15 LAB — IRON AND TIBC
Iron: 37 ug/dL (ref 28–170)
Saturation Ratios: 14 % (ref 10.4–31.8)
TIBC: 270 ug/dL (ref 250–450)
UIBC: 233 ug/dL

## 2022-05-15 LAB — RETICULOCYTES
Immature Retic Fract: 12.4 % (ref 2.3–15.9)
RBC.: 3.11 MIL/uL — ABNORMAL LOW (ref 3.87–5.11)
Retic Count, Absolute: 90.5 10*3/uL (ref 19.0–186.0)
Retic Ct Pct: 2.9 % (ref 0.4–3.1)

## 2022-05-15 LAB — FERRITIN: Ferritin: 112 ng/mL (ref 11–307)

## 2022-05-15 NOTE — Progress Notes (Signed)
PROGRESS NOTE    Karen [name]  PVX:[id number] DOB: [birth year] DOA: 05/05/2022 PCP: [facility]    Brief Narrative:  77 y.o. 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 Hemoglobin down from normal 12.9 to 10g/dl and subsequently 8.8 with active bleeding identified on CT angiogram. Underwent embolization of the distal left colic/sigmoid branch artery with resolution of bleeding. Hemoglobin has stabilized. Developed inferolateral wall STEMI on 1/10 now s/p DES and recurrent stenting on 1/13 for recurrent chest pain.  Patient is medically stabilized and waiting to go to CIR.   Assessment & Plan:   Inferolateral STEMI, CAD, hx CABG: s/p DES to SVG to RCA.  - LHC 1/10 with high-grade in-stent stenosis prox SVG to distal RCA stented, repeat 1/13 with more distal DES placement.  - Continue indefinite plavix, plan for 30 days ASA in light of her recent bleeding.  - Continue imdur, high intensity statin - prn NTG  - Not on beta blocker, due to low blood pressures.  Acute blood loss anemia due to diverticular GI bleed: s/p distal left colic/sigmoid artery branch vessel embolization by Dr. Milford Cage - s/p 1u PRBC 1/7. Hgb improved and stabilized. Will continue antiplatelets as above.  No evidence of ongoing bleeding.   History of CHF with recovered EF, HTN: LVEF 50-55%.  - BP normalized and patient's home medications were restarted. Soft BP usually first thing in the morning, improves as she wakes up and gets around. Will continue monitoring.    Hx VF arrest s/p AICD. Keep K and Mg replete.    COPD: Quiescent.  - Continue home meds   History of left lung CA s/p SBRT 2022.    Stage IIIa/b CKD: At about baseline.   HLD:  - Continue high-intensity statin   Vitamin B12 deficiency: Marginal B12 level and macrocytosis noted.  - Continue supplement    Tobacco use:  - Cessation counseling provided - Nicotine patch while admitted   GERD, chronic dysphagia:  - Continue with plans for EGD with [facility].    Deconditioning:  - CIR admission pending.  Medically stable.    DVT prophylaxis: SCD's Start: 05/08/22 1439 SCDs Start: 05/05/22 2209   Code Status: Full code Family Communication: None at the bedside Disposition Plan: Status is: Inpatient Remains inpatient appropriate because: Waiting for rehab bed.     Consultants:  Cardiology GI Intervention radiology  Procedures:  Embolization PCI with stenting 1/10, 1/13  Antimicrobials:  None   Subjective: Seen in the morning rounds.  No overnight events.  Denies any complaints at rest.  Still gets some short of breath on mobilizing.  Agreeable to rehab.  Objective: Vitals:   05/15/22 0327 05/15/22 0809 05/15/22 1021 05/15/22 1112  BP: (!) 101/54 (!) 100/53  (!) 93/53  Pulse: 91 92  88  Resp: (!) 22 18  19   Temp: 98.2 F (36.8 C) 98.1 F (36.7 C)  98.1 F (36.7 C)  TempSrc: Oral Oral  Oral  SpO2: 97% 99% 97% 96%  Weight:      Height:        Intake/Output Summary (Last 24 hours) at 05/15/2022 1124 Last data filed at 05/15/2022 0808 Gross per 24 hour  Intake 811 ml  Output 1575 ml  Net -764 ml   Filed Weights   05/06/22 2055  Weight: 70 kg    Examination:  General exam: Appears calm and comfortable  Respiratory system: Clear to auscultation. Respiratory effort normal. Cardiovascular system: S1 & S2 heard, left precordial pacemaker in place. Gastrointestinal system: Abdomen is nondistended, soft and nontender. No organomegaly or masses felt. Normal bowel sounds heard. Central nervous system: Alert and oriented. No focal neurological deficits. Extremities: Symmetric 5 x 5 power. Skin: No rashes, lesions or ulcers Psychiatry: Judgement and insight appear normal. Mood & affect appropriate.     Data Reviewed: I have personally reviewed following labs and  imaging studies  CBC: Recent Labs  Lab 05/09/22 0617 05/10/22 0038 05/10/22 1444 05/11/22 0013 05/12/22 0021 05/13/22 0020  WBC 4.3 4.4  --  5.2 5.7 6.3  NEUTROABS  --   --   --  2.5 3.4 3.4  HGB 9.6* 8.6* 9.4* 9.0* 9.3* 9.3*  HCT 28.5* 26.3* 27.7* 27.6* 27.9* 29.0*  MCV 97.9 96.7  --  97.2 96.2 97.3  PLT 125* 124*  --  141* 167 190   Basic Metabolic Panel: Recent Labs  Lab 05/09/22 0617 05/10/22 0038 05/11/22 0013 05/12/22 0021 05/13/22 0020  NA 137 136 137 136 137  K 3.6 3.7 3.9 3.6 4.3  CL 107 107 107 104 105  CO2 22 21* 22 26 25   GLUCOSE 110* 151* 162* 141* 127*  BUN 11 14 15 11 13   CREATININE 0.93 0.98 0.97 0.93 1.02*  CALCIUM 8.1* 7.9* 8.4* 8.4* 8.9  MG  --  1.8 1.8 1.7 1.9  PHOS  --   --  3.4 3.8 3.8   GFR: Estimated Creatinine Clearance: 44 mL/min (A) (by C-G formula based on SCr of 1.02 mg/dL (H)). Liver Function Tests: Recent Labs  Lab 05/11/22 0013 05/12/22 0021 05/13/22 0020  AST 18 19 14*  ALT 11 13 13   ALKPHOS 38 51 43  BILITOT 0.4 0.3 0.4  PROT 5.1* 5.2* 5.4*  ALBUMIN 2.7* 2.8* 2.9*   No results for input(s): "LIPASE", "AMYLASE" in the last 168 hours. No results for input(s): "AMMONIA" in the last 168 hours. Coagulation Profile: No results for input(s): "INR", "PROTIME" in the last 168 hours. Cardiac Enzymes: No results for input(s): "CKTOTAL", "CKMB", "CKMBINDEX", "TROPONINI" in the last 168 hours. BNP (last 3 results) No results for input(s): "PROBNP" in the last 8760 hours. HbA1C: No results for input(s): "HGBA1C" in the last 72 hours. CBG: No results for input(s): "GLUCAP" in the last 168 hours. Lipid Profile: No results for input(s): "CHOL", "HDL", "LDLCALC", "TRIG", "CHOLHDL", "LDLDIRECT" in the last 72 hours. Thyroid Function Tests: No results for input(s): "TSH", "T4TOTAL", "FREET4", "T3FREE", "THYROIDAB" in the last 72 hours. Anemia Panel: No results for input(s): "VITAMINB12", "FOLATE", "FERRITIN", "TIBC", "IRON",  "RETICCTPCT" in the last 72 hours. Sepsis Labs: No results for input(s): "PROCALCITON", "LATICACIDVEN" in the last 168 hours.  Recent Results (from the past 240 hour(s))  Resp panel by RT-PCR (RSV, Flu A&B, Covid) Anterior Nasal Swab     Status: None   Collection Time: 05/05/22  1:54 PM   Specimen: Anterior Nasal Swab  Result Value Ref Range Status   SARS Coronavirus 2 by RT PCR NEGATIVE NEGATIVE Final    Comment: (NOTE) SARS-CoV-2 target nucleic acids are NOT DETECTED.  The SARS-CoV-2 RNA is generally detectable in upper respiratory specimens during the acute phase of infection. The lowest concentration of SARS-CoV-2 viral copies this assay can detect is 138 copies/mL. A negative result does not preclude SARS-Cov-2 infection and should not be used as the sole basis for treatment or other patient  management decisions. A negative result may occur with  improper specimen collection/handling, submission of specimen other than nasopharyngeal swab, presence of viral mutation(s) within the areas targeted by this assay, and inadequate number of viral copies(<138 copies/mL). A negative result must be combined with clinical observations, patient history, and epidemiological information. The expected result is Negative.  Fact Sheet for Patients:  BloggerCourse.com  Fact Sheet for Healthcare Providers:  SeriousBroker.it  This test is no t yet approved or cleared by the [place] FDA and  has been authorized for detection and/or diagnosis of SARS-CoV-2 by FDA under an Emergency Use Authorization (EUA). This EUA will remain  in effect (meaning this test can be used) for the duration of the COVID-19 declaration under Section 564(b)(1) of the Act, 21 U.S.C.section 360bbb-3(b)(1), unless the authorization is terminated  or revoked sooner.       Influenza A by PCR NEGATIVE NEGATIVE Final   Influenza B by PCR NEGATIVE NEGATIVE Final     Comment: (NOTE) The Xpert Xpress SARS-CoV-2/FLU/RSV plus assay is intended as an aid in the diagnosis of influenza from Nasopharyngeal swab specimens and should not be used as a sole basis for treatment. Nasal washings and aspirates are unacceptable for Xpert Xpress SARS-CoV-2/FLU/RSV testing.  Fact Sheet for Patients: BloggerCourse.com  Fact Sheet for Healthcare Providers: SeriousBroker.it  This test is not yet approved or cleared by the [place] FDA and has been authorized for detection and/or diagnosis of SARS-CoV-2 by FDA under an Emergency Use Authorization (EUA). This EUA will remain in effect (meaning this test can be used) for the duration of the COVID-19 declaration under Section 564(b)(1) of the Act, 21 U.S.C. section 360bbb-3(b)(1), unless the authorization is terminated or revoked.     Resp Syncytial Virus by PCR NEGATIVE NEGATIVE Final    Comment: (NOTE) Fact Sheet for Patients: BloggerCourse.com  Fact Sheet for Healthcare Providers: SeriousBroker.it  This test is not yet approved or cleared by the [place] FDA and has been authorized for detection and/or diagnosis of SARS-CoV-2 by FDA under an Emergency Use Authorization (EUA). This EUA will remain in effect (meaning this test can be used) for the duration of the COVID-19 declaration under Section 564(b)(1) of the Act, 21 U.S.C. section 360bbb-3(b)(1), unless the authorization is terminated or revoked.  Performed at [facility], [address], [place], [place] [address]   MRSA Next Gen by PCR, Nasal     Status: None   Collection Time: 05/06/22  9:00 PM   Specimen: Nasal Mucosa; Nasal Swab  Result Value Ref Range Status   MRSA by PCR Next Gen NOT DETECTED NOT DETECTED Final    Comment: (NOTE) The GeneXpert MRSA Assay (FDA approved for NASAL specimens only), is one component of a comprehensive  MRSA colonization surveillance program. It is not intended to diagnose MRSA infection nor to guide or monitor treatment for MRSA infections. Test performance is not FDA approved in patients less than 42 years old. Performed at [facility], [address] [place], [place] [address]          Radiology Studies: No results found.      Scheduled Meds:  amLODipine  2.5 mg Oral Daily   arformoterol  15 mcg Nebulization BID   And   umeclidinium bromide  1 puff Inhalation Daily   aspirin  81 mg Oral Daily   clopidogrel  75 mg Oral Q breakfast   furosemide  20 mg Oral Daily   isosorbide mononitrate  60  mg Oral BID   magnesium oxide  400 mg Oral Daily   nicotine  21 mg Transdermal Daily   pantoprazole  40 mg Oral BID   polyethylene glycol  17 g Oral Daily   rosuvastatin  20 mg Oral Daily   sodium chloride flush  3 mL Intravenous Q12H   sodium chloride flush  3 mL Intravenous Q12H   sucralfate  1 g Oral BID   Continuous Infusions:  sodium chloride     sodium chloride       LOS: 10 days    Time spent: 35 minutes    Dorcas Carrow, MD [facility] Pager [phone]

## 2022-05-15 NOTE — Progress Notes (Signed)
Inpatient Rehab Admissions Coordinator:    I spoke with Pt. And let her know that insurance case will be denied, that appeal will take 2-3 days and that CIR may not have a bed until early next week even if we win an appeal this week. She and daughter discussed, and they are open to Pt. Discharging home with home health instead. They do not want SNF. I also called and left VM for Pt.'s son August Saucer, as he is the primary caregiver, to confirm the will be able to care for Pt. At min-mod assist and that he's willing to take her home.   Megan Salon, MS, CCC-SLP Rehab Admissions Coordinator  [phone] (celll) [phone] (office)

## 2022-05-15 NOTE — Progress Notes (Signed)
CARDIAC REHAB PHASE I   Stopped by to offer walk with  pt. Pt declined, stating I walked earlier and have been oob to chair all afternoon, I'm feeling a little tired and just got back to bed. Encouraged pt to rest for now and to attempt one more walk today and oob to chair for dinner. Daughter at bedside. CIR denied, pt and daughter discussed plans for discharge home instead. Will continue to follow.   [phone]  Woodroe Chen, RN BSN 05/15/2022 2:44 PM

## 2022-05-15 NOTE — Progress Notes (Signed)
Inpatient Rehab Admissions Coordinator:    I continue to await insurance auth for CIR. I will follow up once Berkley Harvey is received. Pt. States she does wish to appeal case if denied.   Megan Salon, MS, CCC-SLP Rehab Admissions Coordinator  [phone] (celll) [phone] (office)

## 2022-05-15 NOTE — Progress Notes (Signed)
Mobility Specialist Progress Note    05/15/22 1214  Mobility  Activity Ambulated with assistance in hallway  Level of Assistance Minimal assist, patient does 75% or more  Assistive Device Four wheel walker  Distance Ambulated (ft) 65 ft (30+35)  Activity Response Tolerated well  Mobility Referral Yes  $Mobility charge 1 Mobility   Pre-Mobility: 93 HR, 90% SpO2 Post-Mobility: 99 HR, 96% SpO2  Pt received in bed and agreeable. No complaints on walk. Took x1 seated rest break. Returned to chair with call bell in reach.    [place] Mobility Specialist  Please Neurosurgeon or Rehab Office at [phone]

## 2022-05-15 NOTE — Progress Notes (Signed)
Rounding Note    Patient Name: Karen [name] Date of Encounter: 05/15/2022  [facility] Cardiologist: Lance Muss, MD   Subjective   Did have some chest discomfort with walking.  Of note, she has had chronic chest pain over the years.  Discomfort was not like what she had prior to her stents.  Inpatient Medications    Scheduled Meds:  amLODipine  2.5 mg Oral Daily   arformoterol  15 mcg Nebulization BID   And   umeclidinium bromide  1 puff Inhalation Daily   aspirin  81 mg Oral Daily   clopidogrel  75 mg Oral Q breakfast   furosemide  20 mg Oral Daily   isosorbide mononitrate  60 mg Oral BID   magnesium oxide  400 mg Oral Daily   nicotine  21 mg Transdermal Daily   pantoprazole  40 mg Oral BID   polyethylene glycol  17 g Oral Daily   rosuvastatin  20 mg Oral Daily   sodium chloride flush  3 mL Intravenous Q12H   sodium chloride flush  3 mL Intravenous Q12H   sucralfate  1 g Oral BID   Continuous Infusions:  sodium chloride     sodium chloride     PRN Meds: sodium chloride, sodium chloride, acetaminophen, acetaminophen, albuterol, diazepam, nitroGLYCERIN, mouth rinse, oxyCODONE-acetaminophen, sodium chloride flush, sodium chloride flush   Vital Signs    Vitals:   05/15/22 0327 05/15/22 0809 05/15/22 1021 05/15/22 1112  BP: (!) 101/54 (!) 100/53  (!) 93/53  Pulse: 91 92  88  Resp: (!) 22 18  19   Temp: 98.2 F (36.8 C) 98.1 F (36.7 C)  98.1 F (36.7 C)  TempSrc: Oral Oral  Oral  SpO2: 97% 99% 97% 96%  Weight:      Height:        Intake/Output Summary (Last 24 hours) at 05/15/2022 1231 Last data filed at 05/15/2022 05/17/2022 Gross per 24 hour  Intake 693 ml  Output 1225 ml  Net -532 ml      05/06/2022    8:55 PM 04/23/2022    9:10 AM 03/28/2022   11:07 AM  Last 3 Weights  Weight (lbs) 154 lb 5.2 oz 153 lb 153 lb  Weight (kg) 70 kg 69.4 kg 69.4 kg      Telemetry    Normal sinus rhythm- Personally Reviewed  ECG      Physical  Exam   GEN: No acute distress.   Neck: No JVD Cardiac: RRR, no murmurs, rubs, or gallops.  Respiratory: Clear to auscultation bilaterally. GI: Soft, nontender, non-distended  MS: No edema; No deformity. Neuro:  Nonfocal  Psych: Normal affect   Labs    High Sensitivity Troponin:   Recent Labs  Lab 05/08/22 1543 05/09/22 0617 05/11/22 0942 05/11/22 1607 05/12/22 2259  TROPONINIHS 2,614* 1,187* 313* 294* 117*     Chemistry Recent Labs  Lab 05/11/22 0013 05/12/22 0021 05/13/22 0020 05/15/22 1110  NA 137 136 137 136  K 3.9 3.6 4.3 4.2  CL 107 104 105 103  CO2 22 26 25 27   GLUCOSE 162* 141* 127* 111*  BUN 15 11 13 14   CREATININE 0.97 0.93 1.02* 1.02*  CALCIUM 8.4* 8.4* 8.9 8.9  MG 1.8 1.7 1.9  --   PROT 5.1* 5.2* 5.4*  --   ALBUMIN 2.7* 2.8* 2.9*  --   AST 18 19 14*  --   ALT 11 13 13   --   ALKPHOS 38 51 43  --  BILITOT 0.4 0.3 0.4  --   GFRNONAA >60 >60 57* 57*  ANIONGAP 8 6 7 6     Lipids  Recent Labs  Lab 05/10/22 0038  CHOL 91  TRIG 48  HDL 36*  LDLCALC 45  CHOLHDL 2.5    Hematology Recent Labs  Lab 05/12/22 0021 05/13/22 0020 05/15/22 1110  WBC 5.7 6.3 5.3  RBC 2.90* 2.98* 3.09*  3.11*  HGB 9.3* 9.3* 9.9*  HCT 27.9* 29.0* 30.1*  MCV 96.2 97.3 97.4  MCH 32.1 31.2 32.0  MCHC 33.3 32.1 32.9  RDW 12.2 12.2 12.0  PLT 167 190 241   Thyroid No results for input(s): "TSH", "FREET4" in the last 168 hours.  BNPNo results for input(s): "BNP", "PROBNP" in the last 168 hours.  DDimer No results for input(s): "DDIMER" in the last 168 hours.   Radiology    No results found.  Cardiac Studies   Cath findings reviewed  Patient Profile     77 y.o. female PCI to the SVG to RCA  Assessment & Plan    Inferior MI: Stable from a cardiac standpoint.  Continue her medications.  Blood pressure prevented beta-blocker use.  Anemia: Hemoglobin improved today.  Renal function stable.  Iron does seem a little on the low side.  May benefit from  supplemental iron.  Will defer to internal medicine.  Awaiting approval for rehab.     For questions or updates, please contact [facility] Please consult [email or web address] for contact info under        Signed, 74, MD  05/15/2022, 12:31 PM

## 2022-05-16 MED ORDER — ASPIRIN 81 MG PO CHEW: 81.0000 mg | CHEWABLE_TABLET | Freq: Every day | ORAL | 0 refills | Status: DC

## 2022-05-16 MED ORDER — POLYETHYLENE GLYCOL 3350 17 G PO PACK: 17.0000 g | PACK | Freq: Every day | ORAL | 0 refills | Status: AC

## 2022-05-16 MED ORDER — ROSUVASTATIN CALCIUM 20 MG PO TABS: 20.0000 mg | ORAL_TABLET | Freq: Every day | ORAL | 0 refills | Status: DC

## 2022-05-16 NOTE — Progress Notes (Signed)
Physical Therapy Treatment Patient Details Name: Karen [name] MRN: [id number] DOB: December 03, 1945 Today's Date: 05/16/2022   History of Present Illness Pt is a 77 y.o. F who presents 05/05/2022 with painless hematochezia. Active bleeding identified on CT angiogram. Underwent embolization of the distal left colic/sigmoid branch artery with resolution of bleeding. Hemoglobin stabilized. Developed chest pain and ECG changes consistent with STEMI on the morning of 1/10.  Underwent PCI. 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    PT Comments    Focused session on educating pt and her daughter on how to safely perform and guard pt with transfers and all standing mobility as she is at risk for falls. They verbalized understanding. Gait belt provided to them. Pt was able to transfer to stand with only minguard assist for safety today. Will continue to follow acutely. Updated d/c recs to HHPT as AIR was denied and pt is declining SNF.    Recommendations for follow up therapy are one component of a multi-disciplinary discharge planning process, led by the attending physician.  Recommendations may be updated based on patient status, additional functional criteria and insurance authorization.  Follow Up Recommendations  Home health PT (AIR denied, not wanting SNF)     Assistance Recommended at Discharge Frequent or constant Supervision/Assistance  Patient can return home with the following A lot of help with bathing/dressing/bathroom;Assistance with cooking/housework;Assist for transportation;Help with stairs or ramp for entrance;A little help with walking and/or transfers   Equipment Recommendations  BSC/3in1;Wheelchair (measurements PT);Wheelchair cushion (measurements PT)    Recommendations for Other Services       Precautions / Restrictions Precautions Precautions: Fall Restrictions Weight Bearing Restrictions: No     Mobility  Bed Mobility                General bed mobility comments: Pt up in chair upon arrival.    Transfers Overall transfer level: Needs assistance Equipment used: Rolling walker (2 wheels) Transfers: Sit to/from Stand Sit to Stand: Min guard           General transfer comment: Cued pt to scoot to edge of chair and push up from chair, extra time to rise but min guard for safety only.    Ambulation/Gait Ambulation/Gait assistance: Min assist, Min guard Gait Distance (Feet): 20 Feet Assistive device: Rolling walker (2 wheels) Gait Pattern/deviations: Step-through pattern, Decreased stride length, Decreased weight shift to right, Decreased dorsiflexion - right, Wide base of support Gait velocity: decreased Gait velocity interpretation: <1.8 ft/sec, indicate of risk for recurrent falls   General Gait Details: Pt with slow, small steps and poor feet clearance. Min guard-minA to maintain balance.   Stairs             Wheelchair Mobility    Modified Rankin (Stroke Patients Only)       Balance Overall balance assessment: Needs assistance Sitting-balance support: Feet supported Sitting balance-Leahy Scale: Fair     Standing balance support: Bilateral upper extremity supported Standing balance-Leahy Scale: Poor Standing balance comment: heavily reliant through BUE's                            Cognition Arousal/Alertness: Awake/alert Behavior During Therapy: WFL for tasks assessed/performed Overall Cognitive Status: Within Functional Limits for tasks assessed  Exercises      General Comments General comments (skin integrity, edema, etc.): provided pt with gait belt to take home; educated pt and daughter on guarding pt for all standing mobility (including stairs) and use of DME to reduce her risk for falls. They verbalized understanding.      Pertinent Vitals/Pain Pain Assessment Pain Assessment: Faces Faces  Pain Scale: Hurts a little bit Pain Location: generalized Pain Descriptors / Indicators: Grimacing, Guarding Pain Intervention(s): Limited activity within patient's tolerance, Monitored during session    Home Living                          Prior Function            PT Goals (current goals can now be found in the care plan section) Acute Rehab PT Goals Patient Stated Goal: go home PT Goal Formulation: With patient/family Time For Goal Achievement: 05/26/22 Potential to Achieve Goals: Good Progress towards PT goals: Progressing toward goals    Frequency    Min 3X/week      PT Plan Discharge plan needs to be updated    Co-evaluation              AM-PAC PT "6 Clicks" Mobility   Outcome Measure  Help needed turning from your back to your side while in a flat bed without using bedrails?: A Little Help needed moving from lying on your back to sitting on the side of a flat bed without using bedrails?: A Little Help needed moving to and from a bed to a chair (including a wheelchair)?: A Little Help needed standing up from a chair using your arms (e.g., wheelchair or bedside chair)?: A Little Help needed to walk in hospital room?: A Little Help needed climbing 3-5 steps with a railing? : Total 6 Click Score: 16    End of Session Equipment Utilized During Treatment: Gait belt Activity Tolerance: Patient tolerated treatment well Patient left: with family/visitor present;in chair;Other (comment) (in w/c for d/c) Nurse Communication: Mobility status PT Visit Diagnosis: Unsteadiness on feet (R26.81);Other abnormalities of gait and mobility (R26.89);Muscle weakness (generalized) (M62.81);Difficulty in walking, not elsewhere classified (R26.2)     Time: 9211-9417 PT Time Calculation (min) (ACUTE ONLY): 16 min  Charges:  $Therapeutic Activity: 8-22 mins                     Karen [name], PT, DPT Acute Rehabilitation Services  Office:  [phone]    Karen [name] 05/16/2022, 1:55 PM

## 2022-05-16 NOTE — Progress Notes (Signed)
Discharge order written - instructions reviewed and given to patient. Peripheral IV removed - All questions answered. PT in after discharge to discuss PT at home. Pt to be discharged to discharge lounge and daughter to pick up,

## 2022-05-16 NOTE — Progress Notes (Signed)
Occupational Therapy Treatment Patient Details Name: Karen [name] MRN: [id number] DOB: 1945-09-13 Today's Date: 05/16/2022   History of present illness Pt is a 77 y.o. F who presents 05/05/2022 with painless hematochezia. Active bleeding identified on CT angiogram. Underwent embolization of the distal left colic/sigmoid branch artery with resolution of bleeding. Hemoglobin stabilized. Developed chest pain and ECG changes consistent with STEMI on the morning of 1/10.  Underwent PCI. 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   OT comments  Patient received in supine and eager to get OOB. Patient demonstrates good gains with self care with patient able to perform bathing and dressing seated/standing at sink with setup and min assist for LB dressing due to difficulty getting RLE into underwear and pants. Patient is expected to return home with family support and HHOT for continued rehab.    Recommendations for follow up therapy are one component of a multi-disciplinary discharge planning process, led by the attending physician.  Recommendations may be updated based on patient status, additional functional criteria and insurance authorization.    Follow Up Recommendations  Acute inpatient rehab (3hours/day)     Assistance Recommended at Discharge Frequent or constant Supervision/Assistance  Patient can return home with the following  A little help with walking and/or transfers;A lot of help with bathing/dressing/bathroom;Assistance with cooking/housework;Direct supervision/assist for medications management;Direct supervision/assist for financial management;Assist for transportation;Help with stairs or ramp for entrance   Equipment Recommendations  Other (comment) (defer)    Recommendations for Other Services      Precautions / Restrictions Precautions Precautions: Fall Restrictions Weight Bearing Restrictions: No       Mobility Bed  Mobility Overal bed mobility: Needs Assistance Bed Mobility: Supine to Sit     Supine to sit: Supervision     General bed mobility comments: sukpervision and increased time    Transfers Overall transfer level: Needs assistance Equipment used: Rolling walker (2 wheels) Transfers: Sit to/from Stand Sit to Stand: Min guard           General transfer comment: min guard to power up from EOB and chair     Balance Overall balance assessment: Needs assistance Sitting-balance support: Feet supported Sitting balance-Leahy Scale: Fair     Standing balance support: Single extremity supported, Bilateral upper extremity supported, During functional activity Standing balance-Leahy Scale: Poor Standing balance comment: reliant on RW or sink for support, able to stand to pull up clothing                           ADL either performed or assessed with clinical judgement   ADL Overall ADL's : Needs assistance/impaired     Grooming: Set up;Sitting   Upper Body Bathing: Set up;Sitting   Lower Body Bathing: Supervison/ safety;Sit to/from stand   Upper Body Dressing : Supervision/safety;Sitting   Lower Body Dressing: Minimal assistance;Sit to/from stand Lower Body Dressing Details (indicate cue type and reason): assistance with treading legs through clothing               General ADL Comments: peformed bathing and dressing seated/standing at sink with setup provided    Extremity/Trunk Assessment              Vision       Perception     Praxis      Cognition Arousal/Alertness: Awake/alert Behavior During Therapy: WFL for tasks assessed/performed Overall Cognitive Status: Within Functional Limits for tasks  assessed                                 General Comments: eager about discharge home        Exercises      Shoulder Instructions       General Comments      Pertinent Vitals/ Pain       Pain Assessment Pain Assessment:  Faces Faces Pain Scale: Hurts a little bit Pain Location: R hip Pain Descriptors / Indicators: Grimacing, Guarding Pain Intervention(s): Monitored during session, Repositioned  Home Living                                          Prior Functioning/Environment              Frequency  Min 2X/week        Progress Toward Goals  OT Goals(current goals can now be found in the care plan section)  Progress towards OT goals: Progressing toward goals  Acute Rehab OT Goals Patient Stated Goal: go home OT Goal Formulation: With patient Time For Goal Achievement: 05/27/22 Potential to Achieve Goals: Good ADL Goals Pt Will Perform Grooming: with supervision;standing Pt Will Perform Upper Body Dressing: with modified independence;sitting Pt Will Perform Lower Body Dressing: with min guard assist;sit to/from stand Pt Will Transfer to Toilet: with supervision;ambulating  Plan Discharge plan remains appropriate    Co-evaluation                 AM-PAC OT "6 Clicks" Daily Activity     Outcome Measure   Help from another person eating meals?: A Little Help from another person taking care of personal grooming?: A Little Help from another person toileting, which includes using toliet, bedpan, or urinal?: A Lot Help from another person bathing (including washing, rinsing, drying)?: A Lot Help from another person to put on and taking off regular upper body clothing?: A Little Help from another person to put on and taking off regular lower body clothing?: A Lot 6 Click Score: 15    End of Session Equipment Utilized During Treatment: Rolling walker (2 wheels)  OT Visit Diagnosis: Unsteadiness on feet (R26.81);Other abnormalities of gait and mobility (R26.89);Muscle weakness (generalized) (M62.81)   Activity Tolerance Patient tolerated treatment well   Patient Left in chair;with call bell/phone within reach;with nursing/sitter in room   Nurse Communication  Mobility status        Time: 8835-8446 OT Time Calculation (min): 29 min  Charges: OT General Charges $OT Visit: 1 Visit OT Treatments $Self Care/Home Management : 23-37 mins  Alfonse Flavors, OTA Acute Rehabilitation Services  Office [phone]   Dewain Penning 05/16/2022, 11:06 AM

## 2022-05-16 NOTE — Discharge Summary (Signed)
Physician Discharge Summary  Karen [name] MHD:[id number] DOB: 07-Aug-1945 DOA: 05/05/2022  PCP: [facility]  Admit date: 05/05/2022 Discharge date: 05/16/2022  Admitted From: Home Disposition: With home health PT/OT  Recommendations for Outpatient Follow-up:  Follow up with PCP in 1-2 weeks after discharge  Please obtain BMP/CBC in one week Cardiology to schedule follow up   Home Health: PT/OT Equipment/Devices: Available at home  Discharge Condition: Fair CODE STATUS: Full code Diet recommendation: Low-salt diet  Discharge summary: 77 y.o. 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 Hemoglobin down from normal 12.9 to 10g/dl and subsequently 8.8 with active bleeding identified on CT angiogram. Underwent embolization of the distal left colic/sigmoid branch artery with resolution of bleeding. Hemoglobin has stabilized. Developed inferolateral wall STEMI on 1/10 now s/p DES and recurrent stenting on 1/13 for recurrent chest pain.  Patient is medically stabilized now.  Therapy is recommended acute inpatient rehab, declined by insurance company.  Patient did not entertain the idea of going to a skilled nursing facility.  Patient does have primary support system at home so she is discharged home with family.  She will have home health PT OT.  Treated for following conditions:   Inferolateral STEMI, CAD, hx CABG: s/p DES to SVG to RCA.  - LHC 1/10 with high-grade in-stent stenosis prox SVG to distal RCA stented, repeat 1/13 with more distal DES placement.  - Continue indefinite plavix, plan for 30 days ASA in light of her recent bleeding.  - Continue imdur, high intensity statin - prn NTG  - Not on beta blocker, due to low blood pressures. -She will go back on lisinopril 10 mg daily.   Acute blood loss anemia due to diverticular GI bleed: s/p distal left  colic/sigmoid artery branch vessel embolization by Dr. Milford Cage - s/p 1u PRBC 1/7. Hgb improved and stabilized. Will continue antiplatelets as above.  No evidence of ongoing bleeding.   History of CHF with recovered EF, HTN: LVEF 50-55%.  - BP normalized and patient's home medications were restarted.  Currently euvolemic.  Hx VF arrest s/p AICD.  Stable.  Electrolytes are optimal.   COPD: Quiescent.  - Continue home meds   History of left lung CA s/p SBRT 2022.    Stage IIIa/b CKD: At about baseline.   HLD:  - Continue high-intensity statin   Vitamin B12 deficiency: Marginal B12 level and macrocytosis noted.  - Continue supplement   Tobacco use:  - Cessation counseling provided   GERD, chronic dysphagia:  - Continue with plans for EGD with [facility].    Stable for discharge home.  Discharge Diagnoses:  Principal Problem:   Lower GI bleed Active Problems:   Adenocarcinoma, lung (HCC)   Tobacco use   Essential hypertension   Coronary artery disease involving native heart with angina pectoris (HCC)   VENTRICULAR FIBRILLATION   Stage 2 moderate COPD by GOLD classification (HCC)   ST elevation myocardial infarction involving right coronary artery (HCC)   Chest pain   Prolonged QT interval   Acute ST elevation myocardial infarction (STEMI) of inferolateral wall (HCC)   Anemia    Discharge Instructions  Discharge Instructions     AMB Referral to Cardiac Rehabilitation - Phase II   Complete by: As directed    Diagnosis: STEMI   After initial evaluation and assessments completed: Virtual Based Care may  be provided alone or in conjunction with Phase 2 Cardiac Rehab based on patient barriers.: Yes   Intensive Cardiac Rehabilitation (ICR) [place] location only OR Traditional Cardiac Rehabilitation (TCR) *If criteria for ICR are not met will enroll in TCR [facility] only): Yes   Amb Referral to Cardiac Rehabilitation   Complete by: As directed    Diagnosis: STEMI   After initial  evaluation and assessments completed: Virtual Based Care may be provided alone or in conjunction with Phase 2 Cardiac Rehab based on patient barriers.: Yes   Intensive Cardiac Rehabilitation (ICR) [place] location only OR Traditional Cardiac Rehabilitation (TCR) *If criteria for ICR are not met will enroll in TCR [facility] only): Yes   Call MD for:   Complete by: As directed    More than trace of blood in your stool   Call MD for:  difficulty breathing, headache or visual disturbances   Complete by: As directed    Call MD for:  severe uncontrolled pain   Complete by: As directed    Diet - low sodium heart healthy   Complete by: As directed    Discharge instructions   Complete by: As directed    - Stop taking pepcid and instead take protonix 40mg  twice daily and carafate solution twice daily to help with reflux symptoms.  - Start taking vitamin B12 supplement daily - Please follow up with your GI doctor after discharge to reschedule EGD and colonoscopy - If you develop a fever, bleeding, worse abdominal pain or other new/worsening symptom, please seek medical attention right away.   Increase activity slowly   Complete by: As directed       Allergies as of 05/16/2022       Reactions   Codeine Itching, Swelling   Novocain [procaine] Hives, Swelling   Eyes swelling        Medication List     STOP taking these medications    famotidine 40 MG tablet Commonly known as: PEPCID       TAKE these medications    acetaminophen 500 MG tablet Commonly known as: TYLENOL Take 500 mg by mouth every 6 (six) hours as needed for headache.   albuterol 108 (90 Base) MCG/ACT inhaler Commonly known as: VENTOLIN HFA Inhale 2 puffs into the lungs every 6 (six) hours as needed for wheezing or shortness of breath.   aspirin 81 MG chewable tablet Chew 1 tablet (81 mg total) by mouth daily.   clopidogrel 75 MG tablet Commonly known as: PLAVIX TAKE 1 TABLET BY MOUTH DAILY   cyanocobalamin 1000 MCG  tablet Commonly known as: VITAMIN B12 Take 1 tablet (1,000 mcg total) by mouth daily.   cyclobenzaprine 10 MG tablet Commonly known as: FLEXERIL Take 10 mg by mouth 3 (three) times daily as needed for muscle spasms.   fluticasone 50 MCG/ACT nasal spray Commonly known as: FLONASE Place 1 spray into both nostrils daily. What changed:  when to take this reasons to take this   furosemide 20 MG tablet Commonly known as: LASIX TAKE 1 TABLET BY MOUTH DAILY   guaiFENesin 600 MG 12 hr tablet Commonly known as: Mucinex Take 1 tablet (600 mg total) by mouth 2 (two) times daily. What changed:  when to take this reasons to take this   isosorbide mononitrate 60 MG 24 hr tablet Commonly known as: IMDUR Take 1 tablet (60 mg total) by mouth 2 (two) times daily.   lisinopril 10 MG tablet Commonly known as: ZESTRIL TAKE 1 TABLET BY MOUTH  DAILY   loratadine 10 MG tablet Commonly known as: CLARITIN Take 1 tablet (10 mg total) by mouth daily. What changed:  when to take this reasons to take this   naloxone 4 MG/0.1ML Liqd nasal spray kit Commonly known as: NARCAN SMARTSIG:Both Nares   nitroGLYCERIN 0.4 MG SL tablet Commonly known as: NITROSTAT Place 1 tablet (0.4 mg total) under the tongue every 5 (five) minutes as needed for chest pain (X3 DOSES MAX).   oxyCODONE-acetaminophen 7.5-325 MG tablet Commonly known as: PERCOCET Take 1-2 tablets by mouth every 4 (four) hours as needed for moderate pain (back pain).   pantoprazole 40 MG tablet Commonly known as: PROTONIX Take 1 tablet (40 mg total) by mouth 2 (two) times daily.   polyethylene glycol 17 g packet Commonly known as: MIRALAX / GLYCOLAX Take 17 g by mouth daily.   rosuvastatin 20 MG tablet Commonly known as: CRESTOR Take 1 tablet (20 mg total) by mouth daily.   Stiolto Respimat 2.5-2.5 MCG/ACT Aers Generic drug: Tiotropium Bromide-Olodaterol Inhale 2 puffs into the lungs daily.   sucralfate 1 GM/10ML  suspension Commonly known as: CARAFATE Take 10 mLs (1 g total) by mouth 2 (two) times daily for 14 days.        Follow-up Information     [facility].   Contact information: [address] [place] [address] [phone]                Allergies  Allergen Reactions   Codeine Itching and Swelling   Novocain [Procaine] Hives and Swelling    Eyes swelling    Consultations: Cardiology Gastroenterology Interventional radiology   Procedures/Studies: DG CHEST PORT 1 VIEW  Result Date: 05/12/2022 CLINICAL DATA:  Shortness of breath. EXAM: PORTABLE CHEST 1 VIEW COMPARISON:  Radiograph 05/05/2022 FINDINGS: Prior median sternotomy. Left-sided pacemaker remains in place. Coronary stent is visualized. The heart is normal in size. Stable mediastinal contours. Aortic atherosclerosis. No pulmonary edema, focal airspace disease, large pleural effusion or pneumothorax. IMPRESSION: No acute findings or explanation for shortness of breath. Electronically Signed   By: Narda Rutherford M.D.   On: 05/12/2022 13:51   CARDIAC CATHETERIZATION  Result Date: 05/11/2022 1.  Total occlusion of the native RCA 2.  Patent left main and LAD with mild diffuse nonobstructive plaquing, no significant changes from recent cardiac catheterization study 3.  Patent left circumflex and intermediate branch with diffuse plaquing and distal vessel disease but no significant proximal stenosis 4.  Known occlusion of diagonal and obtuse marginal saphenous vein grafts 5.  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  Severe saphenous vein graft to RCA stenoses, treated with drug-eluting stent implantation (3.0 x 24 mm Synergy DES in distal body of graft, overlapping 3.0 x 32 mm and 3.0 x 16 mm Synergy DES in proximal body of graft within the previously implanted stent) Recommendations: Long-term DAPT with aspirin and clopidogrel as tolerated.   CARDIAC CATHETERIZATION  Result Date: 05/08/2022   Ost  LAD to Prox LAD lesion is 30% stenosed.   Ramus lesion is 40% stenosed.   Prox Cx to Mid Cx lesion is 75% stenosed.   Mid RCA lesion is 70% stenosed.   Origin lesion is 85% stenosed.   Prox Graft to Mid Graft lesion is 40% stenosed.   Mid Graft lesion is 60% stenosed.   Dist Graft lesion is 70% stenosed.   Dist LAD lesion is 30% stenosed.   Balloon angioplasty was performed.   Post intervention, there is a  25% residual stenosis.   LV end diastolic pressure is low. Acute coronary syndrome with transient ST segment elevation in the inferior and anterolateral leads secondary to high-grade stenosis in a stent in the proximal SVG supplying the distal RCA. The left main coronary artery is widely patent status post remote intervention.  The LAD has 30% smooth proximal stenosis and 30% distal stenosis.  There is antegrade flow down the diagonal and LAD without competitive filling. The ramus intermediate vessel has smooth 40% proximal stenosis. Left circumflex vessel has eccentric 75% stenosis on a bend in the vessel. The native RCA has a previously placed stent region of the acute margin and it appears that the distal RCA does not feel from the segment. The SVG supplying the distal RCA is patent but diffusely diseased.  There appears to be a stent in the very proximal portion of the graft with focal 85 to 90% stenosis just at the beginning of this stent.  The RCA graft has diffuse 40 6070% stenoses throughout.  The vessel fills the distal RCA PDA and PLA branches. Atretic LIMA graft which had supplied the LAD. Occluded vein graft which had supplied the circumflex vessel. Occluded vein graft which had supplied the diagonal vessel. LVEDP 6 mmHg. Successful percutaneous coronary intervention with PTCA in the very proximal portion of the previously placed SVG stent with the 85 to 90% stenosis being reduced to 25%. RECOMMENDATON: Continue DAPT indefinitely.  Apparently, Plavix had been held for several days due to the patient's  recent lower GI bleed which was successfully treated with coil embolization by interventional radiology.  Aggressive lipid management with target LDL < 55.   IR EMBO ART  VEN HEMORR LYMPH EXTRAV  INC GUIDE ROADMAPPING  Result Date: 05/06/2022 INDICATION: Hematochezia.  Hypotension. EXAM: Procedures: 1. ABDOMINAL AORTOGRAPHY 2. INFERIOR MESENTERIC ARTERY ARTERIOGRAPHY 3. COIL EMBOLIZATION OF ACTIVE EXTRAVASATION AT DISTAL DESCENDING COLON / PROXIMAL SIGMOID COLON COMPARISON:  CT AP, earlier same day. MEDICATIONS: Benadryl 25 mg IV.  Ancef 2 g IV. ANESTHESIA/SEDATION: Moderate (conscious) sedation was employed during this procedure. A total of Versed 2 mg and Fentanyl 100 mcg was administered intravenously. Moderate Sedation Time: 85 minutes. The patient's level of consciousness and vital signs were monitored continuously by radiology nursing throughout the procedure under my direct supervision. CONTRAST:  65 mL Omnipaque 300 FLUOROSCOPY TIME:  Fluoroscopic dose; 247 mGy COMPLICATIONS: None immediate. PROCEDURE: Informed consent was obtained from the patient and/or patient's representative following explanation of the procedure, risks, benefits and alternatives. All questions were addressed. A time out was performed prior to the initiation of the procedure. Maximal barrier sterile technique utilized including caps, mask, sterile gowns, sterile gloves, large sterile drape, hand hygiene, and Betadine prep. The RIGHT femoral head was marked fluoroscopically. Under sterile conditions and local anesthesia, the RIGHT common femoral artery access was performed with a micropuncture needle. Under direct ultrasound guidance, the RIGHT common femoral was accessed with a micropuncture kit. An ultrasound image was saved for documentation purposes. This allowed for placement of a 5 Fr vascular sheath. A limited arteriogram was performed through the side arm of the sheath confirming appropriate access within the RIGHT common  femoral artery. The short sheath was then exchanged for a 5 Fr, 35 cm Brite tip for additional stability. A limited abdominal aortogram was performed to help identify the IMA origin. Over a Bentson wire, a Sos catheter was advanced, reformed, back bled and flushed. The catheter was then utilized to select the inferior mesenteric artery and a  selective inferior mesenteric arteriogram was performed. Using a 2.5 Fr lantern microcatheter and microwire, access into the distal LEFT colic/sigmoid artery branch vessels was performed and selective arteriography was obtained to identify the bleeding vessel. Super selective coil embolization was then performed using multiple non detachable micro coils. A post embolization arteriogram was performed without evidence of residual extravasation. Images were reviewed and the procedure was terminated. All wires, catheters and sheaths were removed from the patient. Hemostasis was achieved at the RIGHT groin access site with Angio-Seal closure device. The patient tolerated the procedure well without immediate post procedural complication. FINDINGS: - access via the RIGHT femoral artery. - Focus of active extravasation from IMA territory, distal LEFT colic / sigmoid artery branch vessel - Successful embolization without residual extravasation - Angio-Seal closure at RIGHT groin. Palpable RLE pulses at the end of the Case IMPRESSION: Successful super-selective coil embolization of focus of active extravasation from IMA territory, distal LEFT colic/sigmoid artery branch vessel, as above. PLAN: - The patient is to remain flat for 2 hours with RIGHT leg straight. - The patient may continue to experience several additional bloody bowel movements however should stabilize in the coming days. Roanna Banning, MD [facility] Radiology Electronically Signed   By: Roanna Banning M.D.   On: 05/06/2022 00:41   IR US Guide Vasc Access Right  Result Date:  05/06/2022 INDICATION: Hematochezia.  Hypotension. EXAM: Procedures: 1. ABDOMINAL AORTOGRAPHY 2. INFERIOR MESENTERIC ARTERY ARTERIOGRAPHY 3. COIL EMBOLIZATION OF ACTIVE EXTRAVASATION AT DISTAL DESCENDING COLON / PROXIMAL SIGMOID COLON COMPARISON:  CT AP, earlier same day. MEDICATIONS: Benadryl 25 mg IV.  Ancef 2 g IV. ANESTHESIA/SEDATION: Moderate (conscious) sedation was employed during this procedure. A total of Versed 2 mg and Fentanyl 100 mcg was administered intravenously. Moderate Sedation Time: 85 minutes. The patient's level of consciousness and vital signs were monitored continuously by radiology nursing throughout the procedure under my direct supervision. CONTRAST:  65 mL Omnipaque 300 FLUOROSCOPY TIME:  Fluoroscopic dose; 247 mGy COMPLICATIONS: None immediate. PROCEDURE: Informed consent was obtained from the patient and/or patient's representative following explanation of the procedure, risks, benefits and alternatives. All questions were addressed. A time out was performed prior to the initiation of the procedure. Maximal barrier sterile technique utilized including caps, mask, sterile gowns, sterile gloves, large sterile drape, hand hygiene, and Betadine prep. The RIGHT femoral head was marked fluoroscopically. Under sterile conditions and local anesthesia, the RIGHT common femoral artery access was performed with a micropuncture needle. Under direct ultrasound guidance, the RIGHT common femoral was accessed with a micropuncture kit. An ultrasound image was saved for documentation purposes. This allowed for placement of a 5 Fr vascular sheath. A limited arteriogram was performed through the side arm of the sheath confirming appropriate access within the RIGHT common femoral artery. The short sheath was then exchanged for a 5 Fr, 35 cm Brite tip for additional stability. A limited abdominal aortogram was performed to help identify the IMA origin. Over a Bentson wire, a Sos catheter was advanced,  reformed, back bled and flushed. The catheter was then utilized to select the inferior mesenteric artery and a selective inferior mesenteric arteriogram was performed. Using a 2.5 Fr lantern microcatheter and microwire, access into the distal LEFT colic/sigmoid artery branch vessels was performed and selective arteriography was obtained to identify the bleeding vessel. Super selective coil embolization was then performed using multiple non detachable micro coils. A post embolization arteriogram was performed without evidence of residual extravasation. Images were reviewed  and the procedure was terminated. All wires, catheters and sheaths were removed from the patient. Hemostasis was achieved at the RIGHT groin access site with Angio-Seal closure device. The patient tolerated the procedure well without immediate post procedural complication. FINDINGS: - access via the RIGHT femoral artery. - Focus of active extravasation from IMA territory, distal LEFT colic / sigmoid artery branch vessel - Successful embolization without residual extravasation - Angio-Seal closure at RIGHT groin. Palpable RLE pulses at the end of the Case IMPRESSION: Successful super-selective coil embolization of focus of active extravasation from IMA territory, distal LEFT colic/sigmoid artery branch vessel, as above. PLAN: - The patient is to remain flat for 2 hours with RIGHT leg straight. - The patient may continue to experience several additional bloody bowel movements however should stabilize in the coming days. Roanna Banning, MD [facility] Radiology Electronically Signed   By: Roanna Banning M.D.   On: 05/06/2022 00:41   IR Angiogram Visceral Selective  Result Date: 05/06/2022 INDICATION: Hematochezia.  Hypotension. EXAM: Procedures: 1. ABDOMINAL AORTOGRAPHY 2. INFERIOR MESENTERIC ARTERY ARTERIOGRAPHY 3. COIL EMBOLIZATION OF ACTIVE EXTRAVASATION AT DISTAL DESCENDING COLON / PROXIMAL SIGMOID  COLON COMPARISON:  CT AP, earlier same day. MEDICATIONS: Benadryl 25 mg IV.  Ancef 2 g IV. ANESTHESIA/SEDATION: Moderate (conscious) sedation was employed during this procedure. A total of Versed 2 mg and Fentanyl 100 mcg was administered intravenously. Moderate Sedation Time: 85 minutes. The patient's level of consciousness and vital signs were monitored continuously by radiology nursing throughout the procedure under my direct supervision. CONTRAST:  65 mL Omnipaque 300 FLUOROSCOPY TIME:  Fluoroscopic dose; 247 mGy COMPLICATIONS: None immediate. PROCEDURE: Informed consent was obtained from the patient and/or patient's representative following explanation of the procedure, risks, benefits and alternatives. All questions were addressed. A time out was performed prior to the initiation of the procedure. Maximal barrier sterile technique utilized including caps, mask, sterile gowns, sterile gloves, large sterile drape, hand hygiene, and Betadine prep. The RIGHT femoral head was marked fluoroscopically. Under sterile conditions and local anesthesia, the RIGHT common femoral artery access was performed with a micropuncture needle. Under direct ultrasound guidance, the RIGHT common femoral was accessed with a micropuncture kit. An ultrasound image was saved for documentation purposes. This allowed for placement of a 5 Fr vascular sheath. A limited arteriogram was performed through the side arm of the sheath confirming appropriate access within the RIGHT common femoral artery. The short sheath was then exchanged for a 5 Fr, 35 cm Brite tip for additional stability. A limited abdominal aortogram was performed to help identify the IMA origin. Over a Bentson wire, a Sos catheter was advanced, reformed, back bled and flushed. The catheter was then utilized to select the inferior mesenteric artery and a selective inferior mesenteric arteriogram was performed. Using a 2.5 Fr lantern microcatheter and microwire, access into the  distal LEFT colic/sigmoid artery branch vessels was performed and selective arteriography was obtained to identify the bleeding vessel. Super selective coil embolization was then performed using multiple non detachable micro coils. A post embolization arteriogram was performed without evidence of residual extravasation. Images were reviewed and the procedure was terminated. All wires, catheters and sheaths were removed from the patient. Hemostasis was achieved at the RIGHT groin access site with Angio-Seal closure device. The patient tolerated the procedure well without immediate post procedural complication. FINDINGS: - access via the RIGHT femoral artery. - Focus of active extravasation from IMA territory, distal LEFT colic / sigmoid artery branch vessel -  Successful embolization without residual extravasation - Angio-Seal closure at RIGHT groin. Palpable RLE pulses at the end of the Case IMPRESSION: Successful super-selective coil embolization of focus of active extravasation from IMA territory, distal LEFT colic/sigmoid artery branch vessel, as above. PLAN: - The patient is to remain flat for 2 hours with RIGHT leg straight. - The patient may continue to experience several additional bloody bowel movements however should stabilize in the coming days. Roanna Banning, MD [facility] Radiology Electronically Signed   By: Roanna Banning M.D.   On: 05/06/2022 00:41   IR Aortagram Abdominal Serialogram  Result Date: 05/06/2022 INDICATION: Hematochezia.  Hypotension. EXAM: Procedures: 1. ABDOMINAL AORTOGRAPHY 2. INFERIOR MESENTERIC ARTERY ARTERIOGRAPHY 3. COIL EMBOLIZATION OF ACTIVE EXTRAVASATION AT DISTAL DESCENDING COLON / PROXIMAL SIGMOID COLON COMPARISON:  CT AP, earlier same day. MEDICATIONS: Benadryl 25 mg IV.  Ancef 2 g IV. ANESTHESIA/SEDATION: Moderate (conscious) sedation was employed during this procedure. A total of Versed 2 mg and Fentanyl 100 mcg was  administered intravenously. Moderate Sedation Time: 85 minutes. The patient's level of consciousness and vital signs were monitored continuously by radiology nursing throughout the procedure under my direct supervision. CONTRAST:  65 mL Omnipaque 300 FLUOROSCOPY TIME:  Fluoroscopic dose; 247 mGy COMPLICATIONS: None immediate. PROCEDURE: Informed consent was obtained from the patient and/or patient's representative following explanation of the procedure, risks, benefits and alternatives. All questions were addressed. A time out was performed prior to the initiation of the procedure. Maximal barrier sterile technique utilized including caps, mask, sterile gowns, sterile gloves, large sterile drape, hand hygiene, and Betadine prep. The RIGHT femoral head was marked fluoroscopically. Under sterile conditions and local anesthesia, the RIGHT common femoral artery access was performed with a micropuncture needle. Under direct ultrasound guidance, the RIGHT common femoral was accessed with a micropuncture kit. An ultrasound image was saved for documentation purposes. This allowed for placement of a 5 Fr vascular sheath. A limited arteriogram was performed through the side arm of the sheath confirming appropriate access within the RIGHT common femoral artery. The short sheath was then exchanged for a 5 Fr, 35 cm Brite tip for additional stability. A limited abdominal aortogram was performed to help identify the IMA origin. Over a Bentson wire, a Sos catheter was advanced, reformed, back bled and flushed. The catheter was then utilized to select the inferior mesenteric artery and a selective inferior mesenteric arteriogram was performed. Using a 2.5 Fr lantern microcatheter and microwire, access into the distal LEFT colic/sigmoid artery branch vessels was performed and selective arteriography was obtained to identify the bleeding vessel. Super selective coil embolization was then performed using multiple non detachable micro  coils. A post embolization arteriogram was performed without evidence of residual extravasation. Images were reviewed and the procedure was terminated. All wires, catheters and sheaths were removed from the patient. Hemostasis was achieved at the RIGHT groin access site with Angio-Seal closure device. The patient tolerated the procedure well without immediate post procedural complication. FINDINGS: - access via the RIGHT femoral artery. - Focus of active extravasation from IMA territory, distal LEFT colic / sigmoid artery branch vessel - Successful embolization without residual extravasation - Angio-Seal closure at RIGHT groin. Palpable RLE pulses at the end of the Case IMPRESSION: Successful super-selective coil embolization of focus of active extravasation from IMA territory, distal LEFT colic/sigmoid artery branch vessel, as above. PLAN: - The patient is to remain flat for 2 hours with RIGHT leg straight. - The patient may continue to experience several  additional bloody bowel movements however should stabilize in the coming days. Roanna Banning, MD [facility] Rad[facility] Electronically Signed   By: Roanna Banning M.D.   On: 05/06/2022 00:41   CT ANGIO GI BLEED  Result Date: 05/05/2022 CLINICAL DATA:  Lower GI bleeding. EXAM: CTA ABDOMEN AND PELVIS WITHOUT AND WITH CONTRAST TECHNIQUE: Multidetector CT imaging of the abdomen and pelvis was performed using the standard protocol during bolus administration of intravenous contrast. Multiplanar reconstructed images and MIPs were obtained and reviewed to evaluate the vascular anatomy. RADIATION DOSE REDUCTION: This exam was performed according to the departmental dose-optimization program which includes automated exposure control, adjustment of the mA and/or kV according to patient size and/or use of iterative reconstruction technique. CONTRAST:  OMNIPAQUE IOHEXOL 350 MG/ML SOLN COMPARISON:  PET-CT 02/22/2022.  FINDINGS: VASCULAR Aorta: Normal caliber aorta without aneurysm, dissection, vasculitis or significant stenosis. There are atherosclerotic calcifications throughout the aorta. Celiac: Patent without evidence of aneurysm, dissection, vasculitis or significant stenosis. SMA: Patent without evidence of aneurysm, dissection, vasculitis or significant stenosis. Renals: Both renal arteries are patent without evidence of aneurysm, dissection, vasculitis, fibromuscular dysplasia or significant stenosis. IMA: Patent without evidence of aneurysm, dissection, vasculitis or significant stenosis. Inflow: Patent without evidence of aneurysm, dissection, vasculitis or significant stenosis. Atherosclerotic calcifications are present. Proximal Outflow: Bilateral common femoral and visualized portions of the superficial and profunda femoral arteries are patent without evidence of aneurysm, dissection, vasculitis or significant stenosis. Veins: Within normal limits. Review of the MIP images confirms the above findings. NON-VASCULAR Lower chest: Scarring in the right lung base is unchanged. Hepatobiliary: There are 3 subcentimeter rounded hypodensities in the right lobe of the liver. The larger are unchanged from prior in the smaller are too small to characterize. These are favored as cysts or hemangiomas. Gallbladder surgically absent. There is no biliary ductal dilatation. Pancreas: Unremarkable. No pancreatic ductal dilatation or surrounding inflammatory changes. Spleen: Normal in size without focal abnormality. Adrenals/Urinary Tract: There are subcentimeter cortical cysts in each kidney. Otherwise, the kidneys, adrenal glands and bladder are within normal limits. Stomach/Bowel: There is active arterial extravasation of contrast/bleeding within the descending/sigmoid colon junction. There is no focal wall thickening or inflammation in this region. There is no bowel obstruction or free air. There is diffuse colonic diverticulosis.  The appendix, stomach and small bowel loops appear normal. Lymphatic: No enlarged lymph nodes are identified. Reproductive: Status post hysterectomy. No adnexal masses. Other: No abdominal wall hernia or abnormality. No abdominopelvic ascites. Musculoskeletal: Degenerative changes affect the spine. IMPRESSION: 1. Active bleeding within the descending/sigmoid colon junction. No focal wall thickening or inflammation. Aortic Atherosclerosis (ICD10-I70.0). NON-VASCULAR 1. No other acute localizing process in the abdomen or pelvis. 2. Subcentimeter Bosniak II benign renal cyst. No follow-up imaging is recommended. JACR 2018 Feb; 264-273, Management of the Incidental Renal Mass on CT, [employer] 2021; 814-848, Bosniak Classification of Cystic Renal Masses, Version 2019. Electronically Signed   By: Darliss Cheney M.D.   On: 05/05/2022 18:08   DG Chest Port 1 View  Result Date: 05/05/2022 CLINICAL DATA:  Weakness. EXAM: PORTABLE CHEST 1 VIEW COMPARISON:  Chest radiograph dated 12/09/2021 and CT chest dated 02/04/2022. FINDINGS: The heart size and mediastinal contours are within normal limits. Vascular calcifications are seen in the aortic arch. The patient's known bilateral pulmonary nodules are better seen on prior CT. No new focal consolidation, pleural effusion, or pneumothorax is identified. The visualized skeletal structures are unremarkable. A left subclavian approach cardiac device and  median sternotomy wires are redemonstrated. IMPRESSION: No acute pulmonary process. The patient's known bilateral pulmonary nodules are better seen on prior CT. Electronically Signed   By: Romona Curls M.D.   On: 05/05/2022 14:36   (Echo, Carotid, EGD, Colonoscopy, ERCP)    Subjective: Patient seen and examined.  No overnight events.  Denies any chest pain or shortness of breath.  She is confident she can go home.  Her daughter visiting her from [place] is going to stay with her for 2 weeks, FMLA paperwork where  completed.   Discharge Exam: Vitals:   05/16/22 0752 05/16/22 0825  BP:  104/64  Pulse:  95  Resp:  16  Temp:  98.1 F (36.7 C)  SpO2: 97% (!) 88%   Vitals:   05/16/22 0330 05/16/22 0332 05/16/22 0752 05/16/22 0825  BP: (!) 95/57 98/60  104/64  Pulse:  86  95  Resp:  20  16  Temp: 97.6 F (36.4 C) 97.6 F (36.4 C)  98.1 F (36.7 C)  TempSrc: Oral Oral  Oral  SpO2:  94% 97% (!) 88%  Weight:      Height:        General: Pt is alert, awake, not in acute distress, on room air. Cardiovascular: RRR, S1/S2 +, no rubs, no gallops, pacemaker in place. Respiratory: CTA bilaterally, no wheezing, no rhonchi Abdominal: Soft, NT, ND, bowel sounds + Extremities: no edema, no cyanosis    The results of significant diagnostics from this hospitalization (including imaging, microbiology, ancillary and laboratory) are listed below for reference.     Microbiology: Recent Results (from the past 240 hour(s))  MRSA Next Gen by PCR, Nasal     Status: None   Collection Time: 05/06/22  9:00 PM   Specimen: Nasal Mucosa; Nasal Swab  Result Value Ref Range Status   MRSA by PCR Next Gen NOT DETECTED NOT DETECTED Final    Comment: (NOTE) The GeneXpert MRSA Assay (FDA approved for NASAL specimens only), is one component of a comprehensive MRSA colonization surveillance program. It is not intended to diagnose MRSA infection nor to guide or monitor treatment for MRSA infections. Test performance is not FDA approved in patients less than 100 years old. Performed at [facility], [address] [place], [place] [address]      Labs: BNP (last 3 results) No results for input(s): "BNP" in the last 8760 hours. Basic Metabolic Panel: Recent Labs  Lab 05/10/22 0038 05/11/22 0013 05/12/22 0021 05/13/22 0020 05/15/22 1110  NA 136 137 136 137 136  K 3.7 3.9 3.6 4.3 4.2  CL 107 107 104 105 103  CO2 21* 22 26 25 27   GLUCOSE 151* 162* 141* 127* 111*  BUN 14 15 11 13 14   CREATININE 0.98  0.97 0.93 1.02* 1.02*  CALCIUM 7.9* 8.4* 8.4* 8.9 8.9  MG 1.8 1.8 1.7 1.9  --   PHOS  --  3.4 3.8 3.8  --    Liver Function Tests: Recent Labs  Lab 05/11/22 0013 05/12/22 0021 05/13/22 0020  AST 18 19 14*  ALT 11 13 13   ALKPHOS 38 51 43  BILITOT 0.4 0.3 0.4  PROT 5.1* 5.2* 5.4*  ALBUMIN 2.7* 2.8* 2.9*   No results for input(s): "LIPASE", "AMYLASE" in the last 168 hours. No results for input(s): "AMMONIA" in the last 168 hours. CBC: Recent Labs  Lab 05/10/22 0038 05/10/22 1444 05/11/22 0013 05/12/22 0021 05/13/22 0020 05/15/22 1110  WBC 4.4  --  5.2 5.7  6.3 5.3  NEUTROABS  --   --  2.5 3.4 3.4  --   HGB 8.6* 9.4* 9.0* 9.3* 9.3* 9.9*  HCT 26.3* 27.7* 27.6* 27.9* 29.0* 30.1*  MCV 96.7  --  97.2 96.2 97.3 97.4  PLT 124*  --  141* 167 190 241   Cardiac Enzymes: No results for input(s): "CKTOTAL", "CKMB", "CKMBINDEX", "TROPONINI" in the last 168 hours. BNP: Invalid input(s): "POCBNP" CBG: No results for input(s): "GLUCAP" in the last 168 hours. D-Dimer No results for input(s): "DDIMER" in the last 72 hours. Hgb A1c No results for input(s): "HGBA1C" in the last 72 hours. Lipid Profile No results for input(s): "CHOL", "HDL", "LDLCALC", "TRIG", "CHOLHDL", "LDLDIRECT" in the last 72 hours. Thyroid function studies No results for input(s): "TSH", "T4TOTAL", "T3FREE", "THYROIDAB" in the last 72 hours.  Invalid input(s): "FREET3" Anemia work up Recent Labs    05/15/22 1110  FERRITIN 112  TIBC 270  IRON 37  RETICCTPCT 2.9   Urinalysis    Component Value Date/Time   COLORURINE YELLOW 12/09/2021 2100   APPEARANCEUR CLEAR 12/09/2021 2100   LABSPEC 1.010 12/09/2021 2100   PHURINE 6.0 12/09/2021 2100   GLUCOSEU NEGATIVE 12/09/2021 2100   HGBUR NEGATIVE 12/09/2021 2100   BILIRUBINUR NEGATIVE 12/09/2021 2100   KETONESUR NEGATIVE 12/09/2021 2100   PROTEINUR NEGATIVE 12/09/2021 2100   NITRITE NEGATIVE 12/09/2021 2100   LEUKOCYTESUR NEGATIVE 12/09/2021 2100    Sepsis Labs Recent Labs  Lab 05/11/22 0013 05/12/22 0021 05/13/22 0020 05/15/22 1110  WBC 5.2 5.7 6.3 5.3   Microbiology Recent Results (from the past 240 hour(s))  MRSA Next Gen by PCR, Nasal     Status: None   Collection Time: 05/06/22  9:00 PM   Specimen: Nasal Mucosa; Nasal Swab  Result Value Ref Range Status   MRSA by PCR Next Gen NOT DETECTED NOT DETECTED Final    Comment: (NOTE) The GeneXpert MRSA Assay (FDA approved for NASAL specimens only), is one component of a comprehensive MRSA colonization surveillance program. It is not intended to diagnose MRSA infection nor to guide or monitor treatment for MRSA infections. Test performance is not FDA approved in patients less than 40 years old. Performed at [facility], [address] [place], [place] [address]      Time coordinating discharge:  32 minutes  SIGNED:   Dorcas Carrow, MD  [facility] 05/16/2022, 8:58 AM

## 2022-05-16 NOTE — TOC Initial Note (Signed)
Transition of Care [facility]) - Initial/Assessment Note    Patient Details  Name: Karen [name] MRN: [id number] Date of Birth: 1946-01-27  Transition of Care [facility]) CM/SW Contact:    Kingsley Plan, RN Phone Number: 05/16/2022, 9:58 AM  Clinical Narrative:                  Spoke to patient at bedside. Patient's insurance denied CIR, patient not wanting SNF. Patient wants to return to home with son and grand daughter.   Discussed home health PT/OT , patient aware HH only visits twice a week for about a hour at a time. Marland Kitchen No preference in [facility] agency . Tresa Endo with [facility] accepted   Patient has Rollator , 3 in 1 and wheel chair at home   Expected Discharge Plan: Home w Home Health Services Barriers to Discharge: No Barriers Identified   Patient Goals and CMS Choice Patient states their goals for this hospitalization and ongoing recovery are:: to return to go home CMS Medicare.gov Compare Post Acute Care list provided to:: Patient Choice offered to / list presented to : Patient [place] ownership interest in [facility].provided to:: Patient    Expected Discharge Plan and Services   Discharge Planning Services: CM Consult Post Acute Care Choice: Home Health Living arrangements for the past 2 months: Single Family Home Expected Discharge Date: 05/16/22               DME Arranged: N/A         HH Arranged: PT, OT HH Agency: [facility] Date HH Agency Contacted: 05/16/22 Time HH Agency Contacted: [phone] Representative spoke with at [facility] Agency: Tresa Endo  Prior Living Arrangements/Services Living arrangements for the past 2 months: Single Family Home Lives with:: Siblings Patient language and need for interpreter reviewed:: Yes Do you feel safe going back to the place where you live?: Yes      Need for Family Participation in Patient Care: Yes (Comment) Care giver support system in place?: Yes (comment) Current home services: DME Criminal Activity/Legal  Involvement Pertinent to Current Situation/Hospitalization: No - Comment as needed  Activities of Daily Living Home Assistive Devices/Equipment: None ADL Screening (condition at time of admission) Patient's cognitive ability adequate to safely complete daily activities?: Yes Is the patient deaf or have difficulty hearing?: No Does the patient have difficulty seeing, even when wearing glasses/contacts?: No Does the patient have difficulty concentrating, remembering, or making decisions?: No Patient able to express need for assistance with ADLs?: Yes Does the patient have difficulty dressing or bathing?: No Independently performs ADLs?: Yes (appropriate for developmental age) Does the patient have difficulty walking or climbing stairs?: No Weakness of Legs: Both Weakness of Arms/Hands: None  Permission Sought/Granted   Permission granted to share information with : No              Emotional Assessment Appearance:: Appears stated age Attitude/Demeanor/Rapport: Engaged Affect (typically observed): Accepting Orientation: : Oriented to Self, Oriented to Place, Oriented to  Time, Oriented to Situation Alcohol / Substance Use: Not Applicable Psych Involvement: No (comment)  Admission diagnosis:  LLQ pain [R10.32] Lower GI bleed [K92.2] Bloody diarrhea [R19.7] Patient Active Problem List   Diagnosis Date Noted   Anemia 05/15/2022   Acute ST elevation myocardial infarction (STEMI) of inferolateral wall (HCC) 05/08/2022   Lower GI bleed 05/05/2022   Prolonged QT interval 05/05/2022   Chronic cough 09/13/2021   Chest pain 02/16/2021   Nicotine dependence, cigarettes, uncomplicated 02/16/2021  Adenocarcinoma, lung (HCC) 02/16/2021   Claudication (HCC) 02/03/2018   Right carotid bruit 02/03/2018   Angina pectoris (HCC) 01/02/2016   ST elevation myocardial infarction involving right coronary artery (HCC) 12/27/2014   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    Tobacco use 10/26/2009   Stage 2 moderate COPD by GOLD classification (HCC) 10/26/2009   VENTRICULAR FIBRILLATION 10/16/2008   Coronary artery disease involving native heart with angina pectoris (HCC) 07/27/2007   Seasonal allergic rhinitis 07/27/2007   Pulmonary nodules/lesions, multiple 07/27/2007   HEADACHE, CHRONIC 07/27/2007   Mixed hyperlipidemia 07/24/2007   Essential hypertension 07/24/2007   GERD without esophagitis 07/24/2007   PCP:  [facility]:   [facility] [phone] - [place], [place] - [address] [place] [address] Phone: [phone] Fax: [phone]  [id number] [place], [place] - [address] AT [facility] [address] [place] [address] Phone: [phone] Fax: [phone]  [facility] - [place], [place] - [address] [place] [address] Phone: [phone] Fax: [phone]     Social Determinants of Health (SDOH) Social History: SDOH Screenings   Food Insecurity: No Food Insecurity (05/06/2022)  Housing: Low Risk  (05/06/2022)  Transportation Needs: No Transportation Needs (05/06/2022)  Utilities: Not At Risk (05/06/2022)  Tobacco Use: High Risk (05/13/2022)   SDOH Interventions:     Readmission Risk Interventions     No data to display

## 2022-05-22 ENCOUNTER — Other Ambulatory Visit: Payer: Self-pay | Admitting: Emergency Medicine

## 2022-05-23 ENCOUNTER — Telehealth (HOSPITAL_COMMUNITY): Payer: Self-pay

## 2022-05-23 ENCOUNTER — Telehealth: Payer: Self-pay | Admitting: *Deleted

## 2022-05-23 MED ORDER — FUROSEMIDE 40 MG PO TABS: 40.0000 mg | ORAL_TABLET | Freq: Every day | ORAL | 0 refills | Status: DC

## 2022-05-23 MED ORDER — CLOPIDOGREL BISULFATE 75 MG PO TABS: 75.0000 mg | ORAL_TABLET | Freq: Every day | ORAL | 0 refills | Status: DC

## 2022-05-23 NOTE — Telephone Encounter (Signed)
Reviewed with Dr Burt Knack and patient can take lasix 40 mg now and increase daily dose to 40 mg lasix daily.  Needs to take plavix today and daily. Patient notified.  Importance of taking Plavix today and daily stressed to patient.  She will call if swelling /shortness of breath does not improve in a few days.

## 2022-05-23 NOTE — Telephone Encounter (Signed)
Pt insurance is active and benefits verified through [facility] Medicare/Dual Complete. Co-pay $0.00, DED $240.00/$134.84 met, out of pocket $8,850.00/$134.84 met, co-insurance 20%. No pre-authorization required. Passport, 05/23/22 @ 3:07PM, REF#[id number]   How many CR sessions are covered? (36 sessions for TCR, 72 sessions for ICR)72 Is this a lifetime maximum or an annual maximum? Lifetime Has the member used any of these services to date? No Is there a time limit (weeks/months) on start of program and/or program completion? No   2ndary insurance is active and benefits verified through Medicaid. Co-pay $0.00, DED $0.00/$0.00 met, out of pocket $0.00/$0.00 met, co-insurance 0%. No pre-authorization required. Passport, 05/23/22 @ 3:09PM, [phone]      Will contact patient to see if she is interested in the Cardiac Rehab Program. If interested, patient will need to complete follow up appt. Once completed, patient will be contacted for scheduling upon review by the RN Navigator.

## 2022-05-23 NOTE — Telephone Encounter (Signed)
Called patient to see if she is interested in the Cardiac Rehab Program. Patient expressed interest. Explained scheduling process and went over insurance, patient verbalized understanding. Will contact patient for scheduling once f/u has been completed. 

## 2022-05-23 NOTE — Telephone Encounter (Signed)
Received message from Cardiac Rehab that while working patient up for cardiac rehab they noted patient did not have post hospital follow up scheduled. Patient was discharged on 1/18. I spoke with patient and offered her appointment on 1/31 at 8:50. Patient requests appointment later in the day.  Appointment scheduled for 2/2/ at 1:55 with Christen Bame, NP. Patient reports she has swelling in her toes, feet, ankles and calves that started on 1/23.  Has been taking lasix 20 mg daily.  Does not weigh daily.  She will start weighing and call if she gains 3 lbs in a day or 5 lbs in a week.  Patient watches her salt intake but did just recently purchase a beef pot pie to eat today.  Salt content in frozen meals discussed with patient. She is trying to keep her feet elevated during the day.  Patient reports she has been having shortness of breath when walking around the house since Tuesday.  Improves when she rests.  Patient asking for refill of Plavix.  States she was not given prescriptions when she left the hospital. Has been out of Plavix since 1/19.  Refill sent to [facility] on [address].

## 2022-05-24 NOTE — Progress Notes (Unsigned)
Cardiology Office Note:    Date:  06/01/2022   ID:  Karen [name], DOB 09/17/1945, MRN [id number]  PCP:  Center, [facility] Providers Cardiologist:  Larae Grooms, MD Electrophysiologist:  Virl Axe, MD     Referring MD: Center, [facility]   Chief Complaint: CAD s/p PCI  History of Present Illness:    Karen [name] is a very pleasant 77 y.o. female with a hx of CAD s/p multiple MIs, CABG, cardiac arrest s/p AICD implant, HLD, tobacco abuse, aortic atherosclerosis, CKD, vit B12 deficiency, anemia, left lung CA s/p SBRT 2020-07-23,  GERD, and GIB.   Complicated cardiac history.  She had cardiac arrest in early 1998-07-24.  At that time she had an AICD placed in [place] by Dr. Chuck Hint.  In July 23, 2005, she had another cardiac arrest at the time of hospitalization.  Cardiac cath showed an occluded left main which was angioplastied.  She then went to emergency CABG. since that time she has had chronic chest discomfort.  Repeat cardiac cath Jul 24, 2006 showed the grafts were closed.  Her native left main had opened.  She had some other branch vessel disease for which she has been managed medically. Felt improvement on Ranexa but it was not covered by insurance.   Husband passed away in 07-23-17. Diagnosed with lung cancer in Jul 23, 2020 "Stage 1B (T1b, N0, M0) non-small cell lung cancer, adenocarcinoma presented with left lower lobe lung nodule."  Admission 1/10-1/18/24 for painless lower GI bleeding found to have acute bleeding from left colic/sigmoid artery branch treated with coil embolization by interventional radiology. She developed inferior lateral wall STEMI on 1/10.  LHC revealed high-grade in-stent stenosis proximal SVG to distal RCA which was stented. Repeat cath for recurrent chest pain 1/13 with more distal DES placement. Advised to continue indefinite Plavix, aspirin for 30 days in light of her recent bleeding, Imdur, high intensity statin, and lisinopril 10 mg daily, not on  beta-blocker due to low blood pressure acute inpatient rehab was recommended but declined by insurance. She was discharged home with PT and OT.  Today, she is here alone for follow-up. Says she is feeling well. Having occasional chest pain that worsens when she sits down. Occurred 3 x this week and is sometimes associated with sweating. Symptoms usually improve with 1 SL NTG. On one occasion she took 2 ntg at which time she also had symptoms of presyncope. No syncope. She has chronic shortness of breath that she feels is stable. Pain is not associated with shortness of breath. Started having RLE edema about 1 week ago, no orthopnea or PND. Had inadvertently run out of Plavix. Admits she has not been very active. No cigarettes since 05/07/22. No melena, no evidence of bleeding.   Past Medical History:  Diagnosis Date   Acute myocardial infarction, unspecified site, episode of care unspecified    Adenocarcinoma, lung ([place]) 02/16/2021   Allergic rhinitis, cause unspecified    CAD (coronary artery disease)    GERD (gastroesophageal reflux disease)    Headache(784.0)    History of radiation therapy    left lung SBRT 02/08/2021, 02/13/2021, 02/15/2021  Dr Gery Pray   HTN (hypertension)    Other and unspecified hyperlipidemia    Other diseases of lung, not elsewhere classified    Postsurgical aortocoronary bypass status    hx of it.     Past Surgical History:  Procedure Laterality Date   CARDIAC DEFIBRILLATOR PLACEMENT  03/2006   [employer]. remote-yes.  CORONARY ARTERY BYPASS GRAFT     x4   CORONARY STENT INTERVENTION N/A 05/11/2022   Procedure: CORONARY STENT INTERVENTION;  Surgeon: Sherren Mocha, MD;  Location: [place] [facility];  Service: Cardiovascular;  Laterality: N/A;   CORONARY/GRAFT ACUTE MI REVASCULARIZATION N/A 05/08/2022   Procedure: Coronary/Graft Acute MI Revascularization;  Surgeon: Troy Sine, MD;  Location: [place] [facility];  Service: Cardiovascular;  Laterality: N/A;    IMPLANTABLE CARDIOVERTER DEFIBRILLATOR GENERATOR CHANGE N/A 03/03/2013   Procedure: IMPLANTABLE CARDIOVERTER DEFIBRILLATOR GENERATOR CHANGE;  Surgeon: Deboraha Sprang, MD;  Location: [facility];  Service: Cardiovascular;  Laterality: N/A;   IR ANGIOGRAM VISCERAL SELECTIVE  05/06/2022   IR AORTAGRAM ABDOMINAL SERIALOGRAM  05/06/2022   IR EMBO ART  VEN HEMORR LYMPH EXTRAV  INC GUIDE ROADMAPPING  05/06/2022   IR US GUIDE VASC ACCESS RIGHT  05/06/2022   LEFT HEART CATH AND CORONARY ANGIOGRAPHY N/A 05/08/2022   Procedure: LEFT HEART CATH AND CORONARY ANGIOGRAPHY;  Surgeon: Troy Sine, MD;  Location: [place] [facility];  Service: Cardiovascular;  Laterality: N/A;   LEFT HEART CATH AND CORS/GRAFTS ANGIOGRAPHY N/A 05/11/2022   Procedure: LEFT HEART CATH AND CORS/GRAFTS ANGIOGRAPHY;  Surgeon: Sherren Mocha, MD;  Location: [place] [facility];  Service: Cardiovascular;  Laterality: N/A;   stent implant     x1. possibly x2.     Current Medications: Current Meds  Medication Sig   acetaminophen (TYLENOL) 500 MG tablet Take 500 mg by mouth every 6 (six) hours as needed for headache.   albuterol (VENTOLIN HFA) 108 (90 Base) MCG/ACT inhaler Inhale 2 puffs into the lungs every 6 (six) hours as needed for wheezing or shortness of breath.   clopidogrel (PLAVIX) 75 MG tablet Take 1 tablet (75 mg total) by mouth daily.   cyanocobalamin (VITAMIN B12) 1000 MCG tablet Take 1 tablet (1,000 mcg total) by mouth daily.   cyclobenzaprine (FLEXERIL) 10 MG tablet Take 10 mg by mouth 3 (three) times daily as needed for muscle spasms.   fluticasone (FLONASE) 50 MCG/ACT nasal spray Place 1 spray into both nostrils daily. (Patient taking differently: Place 1 spray into both nostrils daily as needed for allergies.)   furosemide (LASIX) 40 MG tablet Take 1 tablet (40 mg total) by mouth daily.   lisinopril (ZESTRIL) 10 MG tablet TAKE 1 TABLET BY MOUTH DAILY   naloxone (NARCAN) nasal spray 4 mg/0.1 mL SMARTSIG:Both Nares    nitroGLYCERIN (NITROSTAT) 0.4 MG SL tablet Place 1 tablet (0.4 mg total) under the tongue every 5 (five) minutes as needed for chest pain (X3 DOSES MAX).   oxyCODONE-acetaminophen (PERCOCET) 7.5-325 MG tablet Take 1-2 tablets by mouth every 4 (four) hours as needed for moderate pain (back pain).   pantoprazole (PROTONIX) 40 MG tablet Take 1 tablet (40 mg total) by mouth 2 (two) times daily.   polyethylene glycol (MIRALAX / GLYCOLAX) 17 g packet Take 17 g by mouth daily.   rosuvastatin (CRESTOR) 20 MG tablet Take 1 tablet (20 mg total) by mouth daily.   [DISCONTINUED] aspirin 81 MG chewable tablet Chew 1 tablet (81 mg total) by mouth daily.   [DISCONTINUED] guaiFENesin (MUCINEX) 600 MG 12 hr tablet Take 1 tablet (600 mg total) by mouth 2 (two) times daily. (Patient taking differently: Take 600 mg by mouth daily as needed for cough.)   [DISCONTINUED] isosorbide mononitrate (IMDUR) 60 MG 24 hr tablet Take 1 tablet (60 mg total) by mouth 2 (two) times daily.   [DISCONTINUED] loratadine (CLARITIN) 10  MG tablet Take 1 tablet (10 mg total) by mouth daily. (Patient taking differently: Take 10 mg by mouth daily as needed for allergies.)   [DISCONTINUED] STIOLTO RESPIMAT 2.5-2.5 MCG/ACT AERS USE 2 INHALATIONS BY MOUTH DAILY     Allergies:   Codeine and Novocain [procaine]   Social History   Socioeconomic History   Marital status: Legally Separated    Spouse name: Not on file   Number of children: Not on file   Years of education: Not on file   Highest education level: Not on file  Occupational History   Not on file  Tobacco Use   Smoking status: Some Days    Packs/day: 0.50    Years: 54.00    Total pack years: 27.00    Types: Cigarettes    Start date: 1968   Smokeless tobacco: Never   Tobacco comments:    2 cigarettes smoked daily. 03/28/22 ARJ   Vaping Use   Vaping Use: Never used  Substance and Sexual Activity   Alcohol use: No   Drug use: No   Sexual activity: Not on file  Other  Topics Concern   Not on file  Social History Narrative   Married, 4 children.    Social Determinants of Health   Financial Resource Strain: Not on file  Food Insecurity: No Food Insecurity (05/06/2022)   Hunger Vital Sign    Worried About Running Out of Food in the Last Year: Never true    Ran Out of Food in the Last Year: Never true  Transportation Needs: No Transportation Needs (05/06/2022)   PRAPARE - Hydrologist (Medical): No    Lack of Transportation (Non-Medical): No  Physical Activity: Not on file  Stress: Not on file  Social Connections: Not on file     Family History: The patient's family history includes Breast cancer in her maternal aunt; Heart attack in her mother; Heart disease in her mother.  ROS:   Please see the history of present illness.    + occasional chest pain + RLE edema + chronic shortness of breath All other systems reviewed and are negative.  Labs/Other Studies Reviewed:    The following studies were reviewed today:  LHC 05/08/22  Ost LAD to Prox LAD lesion is 30% stenosed.   Ramus lesion is 40% stenosed.   Prox Cx to Mid Cx lesion is 75% stenosed.   Mid RCA lesion is 70% stenosed.   Origin lesion is 85% stenosed.   Prox Graft to Mid Graft lesion is 40% stenosed.   Mid Graft lesion is 60% stenosed.   Dist Graft lesion is 70% stenosed.   Dist LAD lesion is 30% stenosed.   Balloon angioplasty was performed.   Post intervention, there is a 25% residual stenosis.   LV end diastolic pressure is low.   Acute coronary syndrome with transient ST segment elevation in the inferior and anterolateral leads secondary to high-grade stenosis in a stent in the proximal SVG supplying the distal RCA.   The left main coronary artery is widely patent status post remote intervention.  The LAD has 30% smooth proximal stenosis and 30% distal stenosis.  There is antegrade flow down the diagonal and LAD without competitive filling.   The  ramus intermediate vessel has smooth 40% proximal stenosis.   Left circumflex vessel has eccentric 75% stenosis on a bend in the vessel.   The native RCA has a previously placed stent region of the acute margin and  it appears that the distal RCA does not feel from the segment.   The SVG supplying the distal RCA is patent but diffusely diseased.  There appears to be a stent in the very proximal portion of the graft with focal 85 to 90% stenosis just at the beginning of this stent.  The RCA graft has diffuse 40 6070% stenoses throughout.  The vessel fills the distal RCA PDA and PLA branches.   Atretic LIMA graft which had supplied the LAD.   Occluded vein graft which had supplied the circumflex vessel.   Occluded vein graft which had supplied the diagonal vessel.   LVEDP 6 mmHg.   Successful percutaneous coronary intervention with PTCA in the very proximal portion of the previously placed SVG stent with the 85 to 90% stenosis being reduced to 25%.   RECOMMENDATON: Continue DAPT indefinitely.  Apparently, Plavix had been held for several days due to the patient's recent lower GI bleed which was successfully treated with coil embolization by interventional radiology.  Aggressive lipid management with target LDL < 55.   Diagnostic Dominance: Right  Intervention     Echo 03/29/22 1. Left ventricular ejection fraction, by estimation, is 50 to 55%. The  left ventricle has low normal function. The left ventricle demonstrates  regional wall motion abnormalities (see scoring diagram/findings for  description). Left ventricular diastolic   parameters were normal.   2. Right ventricular systolic function is normal. The right ventricular  size is normal.   3. The mitral valve is normal in structure. Mild mitral valve  regurgitation. No evidence of mitral stenosis.   4. The aortic valve is tricuspid. Aortic valve regurgitation is not  visualized. No aortic stenosis is present.   5. The  inferior vena cava is normal in size with greater than 50%  respiratory variability, suggesting right atrial pressure of 3 mmHg.  Recent Labs: 05/06/2022: TSH 1.004 05/13/2022: ALT 13; Magnesium 1.9 05/15/2022: BUN 14; Creatinine, Ser 1.02; Hemoglobin 9.9; Platelets 241; Potassium 4.2; Sodium 136  Recent Lipid Panel    Component Value Date/Time   CHOL 91 05/10/2022 0038   CHOL 165 08/16/2019 0959   TRIG 48 05/10/2022 0038   HDL 36 (L) 05/10/2022 0038   HDL 74 08/16/2019 0959   CHOLHDL 2.5 05/10/2022 0038   VLDL 10 05/10/2022 0038   LDLCALC 45 05/10/2022 0038   LDLCALC 80 08/16/2019 0959   LDLDIRECT 82 01/15/2018 1536     Risk Assessment/Calculations:       Physical Exam:    VS:  BP 112/62   Pulse 65   Ht 5\' 3"  (1.6 m)   Wt 156 lb 9.6 oz (71 kg)   SpO2 99%   BMI 27.74 kg/m     Wt Readings from Last 3 Encounters:  05/31/22 156 lb 9.6 oz (71 kg)  05/06/22 154 lb 5.2 oz (70 kg)  04/23/22 153 lb (69.4 kg)     GEN:  Well nourished, well developed in no acute distress HEENT: Normal NECK: No JVD; right carotid bruit CARDIAC: RRR, no murmurs, rubs, gallops RESPIRATORY:  Clear to auscultation without rales, wheezing or rhonchi  ABDOMEN: Soft, non-tender, non-distended MUSCULOSKELETAL:  No edema; No deformity. 2+ pedal pulses, equal bilaterally SKIN: Warm and dry NEUROLOGIC:  Alert and oriented x 3 PSYCHIATRIC:  Normal affect   EKG:  EKG is ordered today.  The ekg ordered today demonstrates normal sinus rhythm at 65 bpm prior inferior infarct, no acute change from previous tracing   Diagnoses:  1. Coronary artery disease of bypass graft of native heart with stable angina pectoris ([place])   2. 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 Implantable cardioverter-defibrillator (ICD) in situ   4. DOE (dyspnea on exertion)   5. Hyperlipidemia LDL goal <70   6. Bruit of right carotid artery   7. Pain of right lower extremity   8. Tobacco use    Assessment and Plan:     CAD  with stable angina: S/p STEMI 1/10 with DES to SVG-RCA and subsequent DES to distal segment on 1/13. Aspirin + Plavix x 30 days then Plavix alone indefinitely. Occasional chest pain accompanied by diaphoresis, relieved with SL NTG. Chronic shortness of breath that she feels is stable. No pain presently.  Have encouraged her to gradually increase activity and have encouraged participation in cardiac rehab.  Will continue to monitor symptoms of angina.  Continue GDMT including clopidogrel, Imdur, lisinopril, rosuvastatin, aspirin.  Advised her to stop aspirin on 06/13/2022. I will see her back in one month for management of angina.   VT s/p AICD implant: Remote device report from 1/29 reviewed, normal device function, no episodes of VT. Continue remote device checks. Management per EP.   Right lower extremity swelling: Swelling in right calf x 3 days. Has not been very active at home since hospital discharge. No pain in calf. No swelling in LLE.  Was out of Plavix for a few days.  Has resumed DAPT with Plavix and aspirin.  Will get vascular u/s to r/o DVT.   Carotid bruit: Right carotid bruit on exam today. 1-39% bilateral stenosis on carotid duplex 01/2018. We will get carotid ultrasound for evaluation of carotid stenosis.   Dyspnea on exertion: Chronic DOE that she feels is stable. No PND, orthopnea. History of left lower lobe lung cancer s/p SBRT last treatment 02/15/21. Management per oncology/pulmonology.   Tobacco use: Has not smoked since 05/07/22. I congratulated her on this achievement.  Complete cessation advised  Hypertension: BP is well controlled.   Hyperlipidemia LDL goal < 70: LDL 45 on 05/10/22, well controlled. Continue rosuvastatin.     Cardiac Rehabilitation Eligibility Assessment  The patient is ready to start cardiac rehabilitation from a cardiac standpoint.     Disposition: 1 month with me  Medication Adjustments/Labs and Tests Ordered: Current medicines are reviewed at  length with the patient today.  Concerns regarding medicines are outlined above.  Orders Placed This Encounter  Procedures   Lipid Profile   Comp Met (CMET)   EKG 12-Lead   VAS US CAROTID   VAS [place] LOWER EXTREMITY VENOUS (DVT)   Meds ordered this encounter  Medications   isosorbide mononitrate (IMDUR) 60 MG 24 hr tablet    Sig: Take 1 tablet (60 mg total) by mouth 2 (two) times daily.    Dispense:  180 tablet    Refill:  3    Patient Instructions  Medication Instructions:   Your physician recommends that you continue on your current medications as directed. Please refer to the Current Medication list given to you today.  DISCONTINUE ASPIRIN on February 15.   *If you need a refill on your cardiac medications before your next appointment, please call your pharmacy*   Lab Work:  Your physician recommends that you return for a FASTING lipid profile. Same day as your testing on February 8. You can come in on the day of your appointment anytime between 7:30-4:30 fasting from midnight the night before.   If you have labs (blood work)  drawn today and your tests are completely normal, you will receive your results only by: MyChart Message (if you have MyChart) OR A paper copy in the mail If you have any lab test that is abnormal or we need to change your treatment, we will call you to review the results.   Testing/Procedures:  Your physician has requested that you have a carotid duplex. This test is an ultrasound of the carotid arteries in your neck. It looks at blood flow through these arteries that supply the brain with blood. Allow one hour for this exam. There are no restrictions or special instructions.  Your physician has requested that you have a lower extremity arterial exercise duplex. During this test, exercise and ultrasound are used to evaluate arterial blood flow in the legs. Allow one hour for this exam. There are no restrictions or special instructions.  Follow-Up: At  [facility], you and your health needs are our priority.  As part of our continuing mission to provide you with exceptional heart care, we have created designated Provider Care Teams.  These Care Teams include your primary Cardiologist (physician) and Advanced Practice Providers (APPs -  Physician Assistants and Nurse Practitioners) who all work together to provide you with the care you need, when you need it.  We recommend signing up for the patient portal called "MyChart".  Sign up information is provided on this After Visit Summary.  MyChart is used to connect with patients for Virtual Visits (Telemedicine).  Patients are able to view lab/test results, encounter notes, upcoming appointments, etc.  Non-urgent messages can be sent to your provider as well.   To learn more about what you can do with MyChart, go to [email or web address].    Your next appointment:   1 month(s)  Provider:   Christen Bame, NP         Other Instructions  Please check  to see if you are on Lisinopril or Losartan.  Give call at [phone] and talk to triage nurse.    Signed, Emmaline Life, NP  06/01/2022 8:05 AM    [place]

## 2022-05-27 ENCOUNTER — Ambulatory Visit: Payer: 59

## 2022-05-27 DIAGNOSIS — I4901 Ventricular fibrillation: Secondary | ICD-10-CM | POA: Diagnosis not present

## 2022-05-28 LAB — CUP PACEART REMOTE DEVICE CHECK
Battery Remaining Longevity: 54 mo
Battery Remaining Percentage: 58 %
Brady Statistic RV Percent Paced: 0 %
Date Time Interrogation Session: 20240129044100
HighPow Impedance: 53 Ohm
Implantable Lead Connection Status: 753985
Implantable Lead Implant Date: 20071205
Implantable Lead Location: 753860
Implantable Lead Model: 137
Implantable Lead Serial Number: 102363
Implantable Pulse Generator Implant Date: 20141105
Lead Channel Impedance Value: 392 Ohm
Lead Channel Pacing Threshold Amplitude: 0.8 V
Lead Channel Pacing Threshold Pulse Width: 0.5 ms
Lead Channel Setting Pacing Amplitude: 2 V
Lead Channel Setting Pacing Pulse Width: 0.5 ms
Lead Channel Setting Sensing Sensitivity: 0.6 mV
Pulse Gen Serial Number: 126146

## 2022-05-29 ENCOUNTER — Other Ambulatory Visit: Payer: Self-pay | Admitting: Emergency Medicine

## 2022-05-31 ENCOUNTER — Encounter: Payer: Self-pay | Admitting: Nurse Practitioner

## 2022-05-31 ENCOUNTER — Ambulatory Visit: Payer: 59 | Attending: Nurse Practitioner | Admitting: Nurse Practitioner

## 2022-05-31 ENCOUNTER — Telehealth: Payer: Self-pay | Admitting: Interventional Cardiology

## 2022-05-31 ENCOUNTER — Other Ambulatory Visit: Payer: Self-pay | Admitting: *Deleted

## 2022-05-31 VITALS — BP 112/62 | HR 65 | Ht 63.0 in | Wt 156.6 lb

## 2022-05-31 DIAGNOSIS — Z95810 Presence of automatic (implantable) cardiac defibrillator: Secondary | ICD-10-CM

## 2022-05-31 DIAGNOSIS — Z720 Tobacco use: Secondary | ICD-10-CM

## 2022-05-31 DIAGNOSIS — M79604 Pain in right leg: Secondary | ICD-10-CM

## 2022-05-31 DIAGNOSIS — I25708 Atherosclerosis of coronary artery bypass graft(s), unspecified, with other forms of angina pectoris: Secondary | ICD-10-CM | POA: Diagnosis not present

## 2022-05-31 DIAGNOSIS — R0609 Other forms of dyspnea: Secondary | ICD-10-CM

## 2022-05-31 DIAGNOSIS — E785 Hyperlipidemia, unspecified: Secondary | ICD-10-CM

## 2022-05-31 DIAGNOSIS — I4901 Ventricular fibrillation: Secondary | ICD-10-CM

## 2022-05-31 DIAGNOSIS — R0989 Other specified symptoms and signs involving the circulatory and respiratory systems: Secondary | ICD-10-CM

## 2022-05-31 MED ORDER — ISOSORBIDE MONONITRATE ER 60 MG PO TB24: 60.0000 mg | ORAL_TABLET | Freq: Two times a day (BID) | ORAL | 3 refills | Status: DC

## 2022-05-31 NOTE — Patient Instructions (Signed)
Medication Instructions:   Your physician recommends that you continue on your current medications as directed. Please refer to the Current Medication list given to you today.  DISCONTINUE ASPIRIN on February 15.   *If you need a refill on your cardiac medications before your next appointment, please call your pharmacy*   Lab Work:  Your physician recommends that you return for a FASTING lipid profile. Same day as your testing on February 8. You can come in on the day of your appointment anytime between 7:30-4:30 fasting from midnight the night before.   If you have labs (blood work) drawn today and your tests are completely normal, you will receive your results only by: [place] (if you have MyChart) OR A paper copy in the mail If you have any lab test that is abnormal or we need to change your treatment, we will call you to review the results.   Testing/Procedures:  Your physician has requested that you have a carotid duplex. This test is an ultrasound of the carotid arteries in your neck. It looks at blood flow through these arteries that supply the brain with blood. Allow one hour for this exam. There are no restrictions or special instructions.  Your physician has requested that you have a lower extremity arterial exercise duplex. During this test, exercise and ultrasound are used to evaluate arterial blood flow in the legs. Allow one hour for this exam. There are no restrictions or special instructions.  Follow-Up: At [facility], you and your health needs are our priority.  As part of our continuing mission to provide you with exceptional heart care, we have created designated Provider Care Teams.  These Care Teams include your primary Cardiologist (physician) and Advanced Practice Providers (APPs -  Physician Assistants and Nurse Practitioners) who all work together to provide you with the care you need, when you need it.  We recommend signing up for the patient  portal called "MyChart".  Sign up information is provided on this After Visit Summary.  MyChart is used to connect with patients for Virtual Visits (Telemedicine).  Patients are able to view lab/test results, encounter notes, upcoming appointments, etc.  Non-urgent messages can be sent to your provider as well.   To learn more about what you can do with MyChart, go to [email or web address].    Your next appointment:   1 month(s)  Provider:   Christen Bame, NP         Other Instructions  Please check  to see if you are on Lisinopril or Losartan.  Give call at [phone] and talk to triage nurse.

## 2022-05-31 NOTE — Telephone Encounter (Signed)
Patient called to report to NP M. Swinyer the medication she referred to at today's visit was lisinopril (ZESTRIL) 10 MG tablet.

## 2022-06-01 ENCOUNTER — Encounter: Payer: Self-pay | Admitting: Nurse Practitioner

## 2022-06-03 ENCOUNTER — Other Ambulatory Visit: Payer: Medicare Other

## 2022-06-03 NOTE — Telephone Encounter (Signed)
Noted and medication list updated.

## 2022-06-05 ENCOUNTER — Ambulatory Visit: Payer: Medicare Other | Admitting: Internal Medicine

## 2022-06-06 ENCOUNTER — Ambulatory Visit (HOSPITAL_COMMUNITY): Admission: RE | Admit: 2022-06-06 | Payer: 59 | Source: Ambulatory Visit

## 2022-06-06 ENCOUNTER — Telehealth: Payer: Self-pay | Admitting: Nurse Practitioner

## 2022-06-06 NOTE — Telephone Encounter (Signed)
Pt states she is returning a call from someone here.. unsure who called but I thought it might be because her appts said no show. However pt states she showed up to her appts but someone at the front desk told her she only had to have labs drawn, please advise if no show can be removed.

## 2022-06-07 ENCOUNTER — Other Ambulatory Visit: Payer: Medicare Other

## 2022-06-07 ENCOUNTER — Encounter (HOSPITAL_COMMUNITY): Payer: Self-pay

## 2022-06-07 LAB — COMPREHENSIVE METABOLIC PANEL
ALT: 16 IU/L (ref 0–32)
AST: 11 IU/L (ref 0–40)
Albumin/Globulin Ratio: 2 (ref 1.2–2.2)
Albumin: 4 g/dL (ref 3.8–4.8)
Alkaline Phosphatase: 77 IU/L (ref 44–121)
BUN/Creatinine Ratio: 11 — ABNORMAL LOW (ref 12–28)
BUN: 12 mg/dL (ref 8–27)
Bilirubin Total: 0.2 mg/dL (ref 0.0–1.2)
CO2: 23 mmol/L (ref 20–29)
Calcium: 9.3 mg/dL (ref 8.7–10.3)
Chloride: 105 mmol/L (ref 96–106)
Creatinine, Ser: 1.05 mg/dL — ABNORMAL HIGH (ref 0.57–1.00)
Globulin, Total: 2 g/dL (ref 1.5–4.5)
Glucose: 86 mg/dL (ref 70–99)
Potassium: 4.2 mmol/L (ref 3.5–5.2)
Sodium: 142 mmol/L (ref 134–144)
Total Protein: 6 g/dL (ref 6.0–8.5)
eGFR: 55 mL/min/{1.73_m2} — ABNORMAL LOW (ref >59)

## 2022-06-07 LAB — LIPID PANEL
Chol/HDL Ratio: 2 ratio (ref 0.0–4.4)
Cholesterol, Total: 142 mg/dL (ref 100–199)
HDL: 71 mg/dL (ref >39)
LDL Chol Calc (NIH): 61 mg/dL (ref 0–99)
Triglycerides: 41 mg/dL (ref 0–149)
VLDL Cholesterol Cal: 10 mg/dL (ref 5–40)

## 2022-06-07 NOTE — Telephone Encounter (Signed)
Spoke with pt who states she presented to the [facility] for testing and labs.  She was sent to the lab and lab tech advised her she did not need to stay she could go home so pt went home. Pt advised will have testing rescheduled and will forward to M.Swinyer,NP for further review.  Pt verbalizes understanding and thanked Therapist, sports for the phone call.

## 2022-06-07 NOTE — Telephone Encounter (Signed)
Could you please call the patient and get the details so that I know who to follow-up with and we still need to have her get the LE venous for potential DVT and the carotid. Really wish they had been done yesterday.

## 2022-06-07 NOTE — Telephone Encounter (Signed)
Testing has been rescheduled for 06/10/2022.  Pt is aware according to appointment notes.

## 2022-06-10 ENCOUNTER — Ambulatory Visit (HOSPITAL_BASED_OUTPATIENT_CLINIC_OR_DEPARTMENT_OTHER)
Admission: RE | Admit: 2022-06-10 | Discharge: 2022-06-10 | Disposition: A | Discharge status: Home / Self Care | Payer: 59 | Source: Ambulatory Visit | Attending: Cardiology | Admitting: Cardiology

## 2022-06-10 ENCOUNTER — Ambulatory Visit (HOSPITAL_COMMUNITY)
Admission: RE | Admit: 2022-06-10 | Discharge: 2022-06-10 | Disposition: A | Discharge status: Home / Self Care | Payer: 59 | Source: Ambulatory Visit | Attending: Cardiology | Admitting: Cardiology

## 2022-06-10 DIAGNOSIS — I4901 Ventricular fibrillation: Secondary | ICD-10-CM | POA: Diagnosis not present

## 2022-06-10 DIAGNOSIS — R0609 Other forms of dyspnea: Secondary | ICD-10-CM

## 2022-06-10 DIAGNOSIS — Z95810 Presence of automatic (implantable) cardiac defibrillator: Secondary | ICD-10-CM

## 2022-06-10 DIAGNOSIS — E785 Hyperlipidemia, unspecified: Secondary | ICD-10-CM

## 2022-06-10 DIAGNOSIS — M79604 Pain in right leg: Secondary | ICD-10-CM

## 2022-06-10 DIAGNOSIS — I25708 Atherosclerosis of coronary artery bypass graft(s), unspecified, with other forms of angina pectoris: Secondary | ICD-10-CM

## 2022-06-10 DIAGNOSIS — R0989 Other specified symptoms and signs involving the circulatory and respiratory systems: Secondary | ICD-10-CM | POA: Diagnosis present

## 2022-06-11 ENCOUNTER — Telehealth: Payer: Self-pay | Admitting: Interventional Cardiology

## 2022-06-11 NOTE — Telephone Encounter (Signed)
Patient states she is returning a call. May be regarding results.

## 2022-06-11 NOTE — Telephone Encounter (Signed)
Reviewed patient's carotid and vascular duplex results with her.

## 2022-06-13 ENCOUNTER — Other Ambulatory Visit: Payer: Self-pay | Admitting: Interventional Cardiology

## 2022-07-01 ENCOUNTER — Other Ambulatory Visit: Payer: Self-pay

## 2022-07-01 ENCOUNTER — Inpatient Hospital Stay: Payer: 59 | Attending: Internal Medicine

## 2022-07-01 ENCOUNTER — Ambulatory Visit (HOSPITAL_COMMUNITY)
Admission: RE | Admit: 2022-07-01 | Discharge: 2022-07-01 | Disposition: A | Discharge status: Home / Self Care | Payer: 59 | Source: Ambulatory Visit | Attending: Internal Medicine | Admitting: Internal Medicine

## 2022-07-01 DIAGNOSIS — C3490 Malignant neoplasm of unspecified part of unspecified bronchus or lung: Secondary | ICD-10-CM

## 2022-07-01 DIAGNOSIS — Z923 Personal history of irradiation: Secondary | ICD-10-CM | POA: Insufficient documentation

## 2022-07-01 DIAGNOSIS — I10 Essential (primary) hypertension: Secondary | ICD-10-CM | POA: Insufficient documentation

## 2022-07-01 DIAGNOSIS — C3432 Malignant neoplasm of lower lobe, left bronchus or lung: Secondary | ICD-10-CM | POA: Insufficient documentation

## 2022-07-01 LAB — CBC WITH DIFFERENTIAL (CANCER CENTER ONLY)
Abs Immature Granulocytes: 0.01 10*3/uL (ref 0.00–0.07)
Basophils Absolute: 0 10*3/uL (ref 0.0–0.1)
Basophils Relative: 0 %
Eosinophils Absolute: 0.1 10*3/uL (ref 0.0–0.5)
Eosinophils Relative: 2 %
HCT: 35.6 % — ABNORMAL LOW (ref 36.0–46.0)
Hemoglobin: 12 g/dL (ref 12.0–15.0)
Immature Granulocytes: 0 %
Lymphocytes Relative: 37 %
Lymphs Abs: 1.8 10*3/uL (ref 0.7–4.0)
MCH: 31.8 pg (ref 26.0–34.0)
MCHC: 33.7 g/dL (ref 30.0–36.0)
MCV: 94.4 fL (ref 80.0–100.0)
Monocytes Absolute: 0.3 10*3/uL (ref 0.1–1.0)
Monocytes Relative: 6 %
Neutro Abs: 2.7 10*3/uL (ref 1.7–7.7)
Neutrophils Relative %: 55 %
Platelet Count: 198 10*3/uL (ref 150–400)
RBC: 3.77 MIL/uL — ABNORMAL LOW (ref 3.87–5.11)
RDW: 12.1 % (ref 11.5–15.5)
WBC Count: 4.9 10*3/uL (ref 4.0–10.5)
nRBC: 0 % (ref 0.0–0.2)

## 2022-07-01 LAB — CMP (CANCER CENTER ONLY)
ALT: 11 U/L (ref 0–44)
AST: 12 U/L — ABNORMAL LOW (ref 15–41)
Albumin: 4.4 g/dL (ref 3.5–5.0)
Alkaline Phosphatase: 75 U/L (ref 38–126)
Anion gap: 5 (ref 5–15)
BUN: 15 mg/dL (ref 8–23)
CO2: 30 mmol/L (ref 22–32)
Calcium: 9.8 mg/dL (ref 8.9–10.3)
Chloride: 106 mmol/L (ref 98–111)
Creatinine: 1.16 mg/dL — ABNORMAL HIGH (ref 0.44–1.00)
GFR, Estimated: 49 mL/min — ABNORMAL LOW (ref >60)
Glucose, Bld: 122 mg/dL — ABNORMAL HIGH (ref 70–99)
Potassium: 4 mmol/L (ref 3.5–5.1)
Sodium: 141 mmol/L (ref 135–145)
Total Bilirubin: 0.4 mg/dL (ref 0.3–1.2)
Total Protein: 6.9 g/dL (ref 6.5–8.1)

## 2022-07-01 MED ORDER — SODIUM CHLORIDE (PF) 0.9 % IJ SOLN
INTRAMUSCULAR | Status: AC
  Filled 2022-07-01: qty 50

## 2022-07-01 MED ORDER — IOHEXOL 300 MG/ML  SOLN
60.0000 mL | Freq: Once | INTRAMUSCULAR | Status: AC | PRN
  Administered 2022-07-01: 60 mL via INTRAVENOUS

## 2022-07-03 ENCOUNTER — Inpatient Hospital Stay (HOSPITAL_BASED_OUTPATIENT_CLINIC_OR_DEPARTMENT_OTHER): Payer: 59 | Admitting: Internal Medicine

## 2022-07-03 ENCOUNTER — Other Ambulatory Visit: Payer: Self-pay

## 2022-07-03 VITALS — BP 115/56 | HR 57 | Temp 98.6°F | Resp 18 | Wt 156.1 lb

## 2022-07-03 DIAGNOSIS — I10 Essential (primary) hypertension: Secondary | ICD-10-CM | POA: Diagnosis not present

## 2022-07-03 DIAGNOSIS — C3490 Malignant neoplasm of unspecified part of unspecified bronchus or lung: Secondary | ICD-10-CM | POA: Diagnosis not present

## 2022-07-03 DIAGNOSIS — Z923 Personal history of irradiation: Secondary | ICD-10-CM | POA: Diagnosis not present

## 2022-07-03 DIAGNOSIS — C3432 Malignant neoplasm of lower lobe, left bronchus or lung: Secondary | ICD-10-CM | POA: Diagnosis present

## 2022-07-03 NOTE — Progress Notes (Signed)
[facility] Telephone:([phone]) [phone]   Fax:(336) [place], [place] [address]  DIAGNOSIS: Stage IB (T1b, N0, M0) non-small cell lung cancer, adenocarcinoma presented with left lower lobe lung nodule diagnosed in September 2022.   PRIOR THERAPY: SBRT under the care of Dr. Sondra Come. Last treatment on 02/15/21.    CURRENT THERAPY: Observation   INTERVAL HISTORY: Karen [name] 77 y.o. female returns to the clinic today for follow-up visit.  The patient is feeling fine today with no concerning complaints.  She has a history of GI bleed in January 2024 and this was complicated with myocardial infarction.  She is feeling much better now she denied having any current chest pain but has shortness of breath with exertion with no cough or hemoptysis.  She has no nausea, vomiting, diarrhea or constipation.  She has occasional dizzy spells and nosebleed.  She is here today for evaluation with repeat CT scan of the chest for restaging of her disease.  MEDICAL HISTORY: Past Medical History:  Diagnosis Date   Acute myocardial infarction, unspecified site, episode of care unspecified    Adenocarcinoma, lung ([facility]) 02/16/2021   Allergic rhinitis, cause unspecified    CAD (coronary artery disease)    GERD (gastroesophageal reflux disease)    Headache(784.0)    History of radiation therapy    left lung SBRT 02/08/2021, 02/13/2021, 02/15/2021  Dr Gery Pray   HTN (hypertension)    Other and unspecified hyperlipidemia    Other diseases of lung, not elsewhere classified    Postsurgical aortocoronary bypass status    hx of it.     ALLERGIES:  is allergic to codeine and novocain [procaine].  MEDICATIONS:  Current Outpatient Medications  Medication Sig Dispense Refill   acetaminophen (TYLENOL) 500 MG tablet Take 500 mg by mouth every 6 (six) hours as needed for headache.     albuterol (VENTOLIN HFA) 108 (90  Base) MCG/ACT inhaler Inhale 2 puffs into the lungs every 6 (six) hours as needed for wheezing or shortness of breath. 54 g 3   clopidogrel (PLAVIX) 75 MG tablet Take 1 tablet (75 mg total) by mouth daily. 90 tablet 0   cyanocobalamin (VITAMIN B12) 1000 MCG tablet Take 1 tablet (1,000 mcg total) by mouth daily. 30 tablet 0   cyclobenzaprine (FLEXERIL) 10 MG tablet Take 10 mg by mouth 3 (three) times daily as needed for muscle spasms.     fluticasone (FLONASE) 50 MCG/ACT nasal spray Place 1 spray into both nostrils daily. (Patient taking differently: Place 1 spray into both nostrils daily as needed for allergies.) 18.2 mL 2   furosemide (LASIX) 40 MG tablet Take 1 tablet (40 mg total) by mouth daily. 90 tablet 0   isosorbide mononitrate (IMDUR) 60 MG 24 hr tablet Take 1 tablet (60 mg total) by mouth 2 (two) times daily. 180 tablet 3   lisinopril (ZESTRIL) 10 MG tablet TAKE 1 TABLET BY MOUTH DAILY 15 tablet 0   naloxone (NARCAN) nasal spray 4 mg/0.1 mL SMARTSIG:Both Nares     nitroGLYCERIN (NITROSTAT) 0.4 MG SL tablet Place 1 tablet (0.4 mg total) under the tongue every 5 (five) minutes as needed for chest pain (X3 DOSES MAX). 25 tablet 11   oxyCODONE-acetaminophen (PERCOCET) 7.5-325 MG tablet Take 1-2 tablets by mouth every 4 (four) hours as needed for moderate pain (back pain).     pantoprazole (PROTONIX) 40 MG tablet Take 1 tablet (40 mg  total) by mouth 2 (two) times daily. 60 tablet 0   polyethylene glycol (MIRALAX / GLYCOLAX) 17 g packet Take 17 g by mouth daily. 14 each 0   rosuvastatin (CRESTOR) 20 MG tablet Take 1 tablet (20 mg total) by mouth daily. 30 tablet 0   sucralfate (CARAFATE) 1 GM/10ML suspension Take 10 mLs (1 g total) by mouth 2 (two) times daily for 14 days. 280 mL 0   No current facility-administered medications for this visit.    SURGICAL HISTORY:  Past Surgical History:  Procedure Laterality Date   CARDIAC DEFIBRILLATOR PLACEMENT  03/2006   [employer]. remote-yes.     CORONARY ARTERY BYPASS GRAFT     x4   CORONARY STENT INTERVENTION N/A 05/11/2022   Procedure: CORONARY STENT INTERVENTION;  Surgeon: Sherren Mocha, MD;  Location: [facility];  Service: Cardiovascular;  Laterality: N/A;   CORONARY/GRAFT ACUTE MI REVASCULARIZATION N/A 05/08/2022   Procedure: Coronary/Graft Acute MI Revascularization;  Surgeon: Troy Sine, MD;  Location: [facility];  Service: Cardiovascular;  Laterality: N/A;   IMPLANTABLE CARDIOVERTER DEFIBRILLATOR GENERATOR CHANGE N/A 03/03/2013   Procedure: IMPLANTABLE CARDIOVERTER DEFIBRILLATOR GENERATOR CHANGE;  Surgeon: Deboraha Sprang, MD;  Location: [facility] C[facility];  Service: Cardiovascular;  Laterality: N/A;   IR ANGIOGRAM VISCERAL SELECTIVE  05/06/2022   IR AORTAGRAM ABDOMINAL SERIALOGRAM  05/06/2022   IR EMBO ART  VEN HEMORR LYMPH EXTRAV  INC GUIDE ROADMAPPING  05/06/2022   IR US GUIDE VASC ACCESS RIGHT  05/06/2022   LEFT HEART CATH AND CORONARY ANGIOGRAPHY N/A 05/08/2022   Procedure: LEFT HEART CATH AND CORONARY ANGIOGRAPHY;  Surgeon: Troy Sine, MD;  Location: [facility];  Service: Cardiovascular;  Laterality: N/A;   LEFT HEART CATH AND CORS/GRAFTS ANGIOGRAPHY N/A 05/11/2022   Procedure: LEFT HEART CATH AND CORS/GRAFTS ANGIOGRAPHY;  Surgeon: Sherren Mocha, MD;  Location: [facility];  Service: Cardiovascular;  Laterality: N/A;   stent implant     x1. possibly x2.     REVIEW OF SYSTEMS:  A comprehensive review of systems was negative except for: Constitutional: positive for fatigue Respiratory: positive for dyspnea on exertion Neurological: positive for dizziness   PHYSICAL EXAMINATION: General appearance: alert, cooperative, fatigued, and no distress Head: Normocephalic, without obvious abnormality, atraumatic Neck: no adenopathy, no JVD, supple, symmetrical, trachea midline, and thyroid not enlarged, symmetric, no tenderness/mass/nodules Lymph nodes: Cervical, supraclavicular, and axillary nodes  normal. Resp: clear to auscultation bilaterally Back: symmetric, no curvature. ROM normal. No CVA tenderness. Cardio: regular rate and rhythm, S1, S2 normal, no murmur, click, rub or gallop GI: soft, non-tender; bowel sounds normal; no masses,  no organomegaly Extremities: extremities normal, atraumatic, no cyanosis or edema  ECOG PERFORMANCE STATUS: 1 - Symptomatic but completely ambulatory  Blood pressure (!) 115/56, pulse (!) 57, temperature 98.6 F (37 C), temperature source Oral, resp. rate 18, weight 156 lb 2 oz (70.8 kg), SpO2 100 %.  LABORATORY DATA: Lab Results  Component Value Date   WBC 4.9 07/01/2022   HGB 12.0 07/01/2022   HCT 35.6 (L) 07/01/2022   MCV 94.4 07/01/2022   PLT 198 07/01/2022      Chemistry      Component Value Date/Time   NA 141 07/01/2022 0906   NA 142 06/06/2022 1423   K 4.0 07/01/2022 0906   CL 106 07/01/2022 0906   CO2 30 07/01/2022 0906   BUN 15 07/01/2022 0906   BUN 12 06/06/2022 1423   CREATININE 1.16 (H) 07/01/2022 0906  Component Value Date/Time   CALCIUM 9.8 07/01/2022 0906   ALKPHOS 75 07/01/2022 0906   AST 12 (L) 07/01/2022 0906   ALT 11 07/01/2022 0906   BILITOT 0.4 07/01/2022 0906       RADIOGRAPHIC STUDIES: CT Chest W Contrast  Result Date: 07/02/2022 CLINICAL DATA:  Staging non-small-cell lung cancer * Tracking Code: BO * EXAM: CT CHEST WITH CONTRAST TECHNIQUE: Multidetector CT imaging of the chest was performed during intravenous contrast administration. RADIATION DOSE REDUCTION: This exam was performed according to the departmental dose-optimization program which includes automated exposure control, adjustment of the mA and/or kV according to patient size and/or use of iterative reconstruction technique. CONTRAST:  65m OMNIPAQUE IOHEXOL 300 MG/ML  SOLN COMPARISON:  Chest CT 02/04/2022 and older.  PET-CT scan 02/22/2022 FINDINGS: Cardiovascular: Left upper chest defibrillator with battery pack and streak artifact. Status  post median sternotomy. The heart nonenlarged. No significant pericardial effusion. Coronary artery calcifications are seen. The thoracic aorta has a normal course and caliber. Scattered calcified plaque along the aorta and branch vessels. Mediastinum/Nodes: No specific abnormal lymph node enlargement identified in the axillary regions, hilum or mediastinum. Normal caliber thoracic esophagus. Lungs/Pleura: Bilateral apical pleural thickening. There is some scarring atelectatic changes along the medial right lung base. Unchanged from prior. There is a developing area of nodular ground-glass in the right lower lobe posteriorly. On series 5 image 68 this measures 2.3 by 1.7 cm. On the PET-CT scan this measured 18 by 13 mm. On the prior this had metabolic activity maximum SUV of 3.0. No right-sided effusion or pneumothorax. The left lung demonstrates tiny effusion with pleural thickening particularly along the superior segment of the left lower lobe with the adjacent parenchymal lung opacity. The opacity is becoming more confluence is slightly larger compared to the previous examinations. This also has some ill-defined uptake on PET-CT. On the PET-CT this had area measured at 2.3 x 1.9 cm in the area of nodularity. Today attempting to measure is similar fashion continuity the area would measure up to 3.9 by 2.5 cm on series 5, image 51. Small area of ground-glass as well left upper lobe on series 5, image 46 is stable. Upper Abdomen: Along the upper abdomen the adrenal glands are preserved. Previous cholecystectomy. Hepatic cysts. Musculoskeletal: Mild degenerative changes along the spine. Stable ill-defined lucent sclerotic focus along the anterior superior endplate of the L1 vertebral body. This could be degenerative. IMPRESSION: Increasing ground-glass nodularity in the right lower lobe as well as increasing opacity seen in the superior segment of the left lower lobe. Both these areas were seen on the examination.  Recommend further evaluation. No developing lymph node enlargement. Aortic Atherosclerosis (ICD10-I70.0). Electronically Signed   By: AJill SideM.D.   On: 07/02/2022 11:04   VAS [place]  Result Date: 06/10/2022 Carotid Arterial Duplex Study Patient Name:  GWELTHA HORAK Date of Exam:   06/10/2022 Medical Rec #: [id number]        Accession #:    [id number] of Birth: [birth year]         Patient Gender: F Patient Age:   89years Exam Location:  [facility] Procedure:      VAS [place] Referring Phys: MChristen Bame--------------------------------------------------------------------------------  Indications:       Carotid artery disease. Patient denies any cerebrovascular                    symptoms at this time. Risk Factors:  Hypertension, hyperlipidemia, current smoker, prior MI,                    coronary artery disease. Limitations        Today's exam was limited due to the high bifurcation of the                    carotids, bilaterally. Comparison Study:  Previous carotid duplex performed 02/12/18 showed RICA                    velocities of 0000000 cm/sec and LICA velocities of 123XX123                    cm/sec. Performing Technologist: Mariane Masters RVT  Examination Guidelines: A complete evaluation includes B-mode imaging, spectral Doppler, color Doppler, and power Doppler as needed of all accessible portions of each vessel. Bilateral testing is considered an integral part of a complete examination. Limited examinations for reoccurring indications may be performed as noted.  Right Carotid Findings: +----------+--------+--------+--------+------------------+--------+           PSV cm/sEDV cm/sStenosisPlaque DescriptionComments +----------+--------+--------+--------+------------------+--------+ CCA Prox  80      10                                         +----------+--------+--------+--------+------------------+--------+ CCA Mid   78      19      <50%    heterogenous       minimal  +----------+--------+--------+--------+------------------+--------+ CCA Distal66      18                                         +----------+--------+--------+--------+------------------+--------+ ICA Prox  79      22              heterogenous               +----------+--------+--------+--------+------------------+--------+ ICA Mid   101     27      1-39%                              +----------+--------+--------+--------+------------------+--------+ ICA Distal75      28                                tortuous +----------+--------+--------+--------+------------------+--------+ ECA       122     14                                         +----------+--------+--------+--------+------------------+--------+ +----------+--------+-------+----------------+-------------------+           PSV cm/sEDV cmsDescribe        Arm Pressure (mmHG) +----------+--------+-------+----------------+-------------------+ Subclavian127     0      Multiphasic, [id number]                 +----------+--------+-------+----------------+-------------------+ +---------+--------+--+--------+--+---------+ VertebralPSV cm/s74EDV cm/s21Antegrade +---------+--------+--+--------+--+---------+  Left Carotid Findings: +----------+--------+--------+--------+------------------+--------+           PSV cm/sEDV cm/sStenosisPlaque DescriptionComments +----------+--------+--------+--------+------------------+--------+ CCA Prox  76      9                                          +----------+--------+--------+--------+------------------+--------+  CCA Mid   73      13      <50%    heterogenous               +----------+--------+--------+--------+------------------+--------+ CCA Distal58      12                                         +----------+--------+--------+--------+------------------+--------+ ICA Prox  51      13              heterogenous                +----------+--------+--------+--------+------------------+--------+ ICA Mid   96      27      1-39%                              +----------+--------+--------+--------+------------------+--------+ ICA Distal58      18                                tortuous +----------+--------+--------+--------+------------------+--------+ ECA       66      9                                          +----------+--------+--------+--------+------------------+--------+ +----------+--------+--------+----------------+-------------------+           PSV cm/sEDV cm/sDescribe        Arm Pressure (mmHG) +----------+--------+--------+----------------+-------------------+ [id number]      0       Multiphasic, [id number]                 +----------+--------+--------+----------------+-------------------+ +---------+--------+--+--------+--+---------+ VertebralPSV cm/s91EDV cm/s17Antegrade +---------+--------+--+--------+--+---------+   Summary: Right Carotid: Velocities in the right ICA are consistent with a 1-39% stenosis.                Non-hemodynamically significant plaque <50% noted in the CCA. Left Carotid: Velocities in the left ICA are consistent with a 1-39% stenosis.               Non-hemodynamically significant plaque <50% noted in the CCA. Vertebrals:  Bilateral vertebral arteries demonstrate antegrade flow. Subclavians: Normal flow hemodynamics were seen in bilateral subclavian              arteries. *See table(s) above for measurements and observations.  Electronically signed by Ida Rogue MD on 06/10/2022 at 8:40:06 PM.    Final    VAS [place] LOWER EXTREMITY VENOUS (DVT)  Result Date: 06/10/2022  Lower Venous DVT Study Patient Name:  HAWA DICECCO  Date of Exam:   06/10/2022 Medical Rec #: [id number]         Accession #:    [id number] Date of Birth: 08-17-1945          Patient Gender: F Patient Age:   50 years Exam Location:  [facility] Procedure:      VAS [place] LOWER EXTREMITY VENOUS (DVT)  Referring Phys: Christen Bame --------------------------------------------------------------------------------  Other Indications: Patient started having right lower extremity edema about 2                    weeks ag and has not been very active at home since hospital  discharge; rule out DVT. Comparison Study: Previous right lower extremity venous duplex performed 11/06/16                   showed no evidence of deep or superficial thrombosis. Performing Technologist: Mariane Masters RVT  Examination Guidelines: A complete evaluation includes B-mode imaging, spectral Doppler, color Doppler, and power Doppler as needed of all accessible portions of each vessel. Bilateral testing is considered an integral part of a complete examination. Limited examinations for reoccurring indications may be performed as noted. The reflux portion of the exam is performed with the patient in reverse Trendelenburg.  +---------+---------------+---------+-----------+----------+--------------+ RIGHT    CompressibilityPhasicitySpontaneityPropertiesThrombus Aging +---------+---------------+---------+-----------+----------+--------------+ CFV      Full           Yes      Yes                                 +---------+---------------+---------+-----------+----------+--------------+ SFJ      Full           Yes      Yes                                 +---------+---------------+---------+-----------+----------+--------------+ FV Prox  Full           Yes      Yes                                 +---------+---------------+---------+-----------+----------+--------------+ FV Mid   Full           Yes      Yes                                 +---------+---------------+---------+-----------+----------+--------------+ FV DistalFull           Yes      Yes                                 +---------+---------------+---------+-----------+----------+--------------+ PFV      Full                                                         +---------+---------------+---------+-----------+----------+--------------+ POP      Full           Yes      Yes                                 +---------+---------------+---------+-----------+----------+--------------+ PTV      Full           Yes      Yes                                 +---------+---------------+---------+-----------+----------+--------------+ PERO     Full           Yes      Yes                                 +---------+---------------+---------+-----------+----------+--------------+  Gastroc  Full                                                        +---------+---------------+---------+-----------+----------+--------------+ GSV      Full           Yes      Yes                                 +---------+---------------+---------+-----------+----------+--------------+   +----+---------------+---------+-----------+----------+--------------+ LEFTCompressibilityPhasicitySpontaneityPropertiesThrombus Aging +----+---------------+---------+-----------+----------+--------------+ CFV Full           Yes      Yes                                 +----+---------------+---------+-----------+----------+--------------+     Summary: RIGHT: - No evidence of deep vein thrombosis in the lower extremity. No indirect evidence of obstruction proximal to the inguinal ligament. - No cystic structure found in the popliteal fossa.  LEFT: - No evidence of common femoral vein obstruction.  *See table(s) above for measurements and observations. Electronically signed by Harold Barban MD on 06/10/2022 at 2:11:36 PM.    Final     ASSESSMENT AND PLAN: This is a very pleasant 77 years old African-American female with Stage IB (T1b, N0, M0) non-small cell lung cancer, adenocarcinoma presented with left lower lobe lung nodule diagnosed in September 2022.  She is status post SBRT under the care of Dr. Sondra Come. Last treatment on 02/15/21.   Unfortunately her last CT scan showed bilateral pulmonary opacities suspicious for low-grade adenocarcinoma but inflammatory process could not be completely ruled out especially with her aspiration issues. Her recent CT scan of the chest in October 2023 showed stable disease with stable bilateral pulmonary nodules that did not resolve in the interval but has been stable since the December 06, 2021 scan. A PET scan showed mildly increased FDG uptake corresponding to the treated lesion within the superior segment of the left lower lobe.  These are secondary to radiation changes.  She also had persistent nonsolid nodule within the posterior right upper lobe with SUV of 3.0 that slightly larger and more hypermetabolic than the previous imaging studies and low-grade malignancy could not be excluded. The patient is currently on observation and repeat CT scan of the chest performed recently showed no concerning findings for disease progression but showed the bilateral airspace disease that has been stable. I recommended for the patient to continue on observation with repeat CT scan of the chest in 6 months. She was advised to call immediately if she has any other concerning symptoms in the interval. The patient voices understanding of current disease status and treatment options and is in agreement with the current care plan.  All questions were answered. The patient knows to call the clinic with any problems, questions or concerns. We can certainly see the patient much sooner if necessary. The total time spent in the appointment was 20 minutes.  Disclaimer: This note was dictated with voice recognition software. Similar sounding words can inadvertently be transcribed and may not be corrected upon review.

## 2022-07-05 ENCOUNTER — Other Ambulatory Visit: Payer: Self-pay | Admitting: Interventional Cardiology

## 2022-07-08 ENCOUNTER — Telehealth (HOSPITAL_COMMUNITY): Payer: Self-pay

## 2022-07-08 NOTE — Telephone Encounter (Signed)
Called patient to see if she was interested in participating in the Cardiac Rehab Program. Patient stated yes. Patient will come in for orientation on 07/16/22 @ 10:30AM and will attend the 11:45AM exercise class.   Tourist information centre manager.

## 2022-07-09 ENCOUNTER — Ambulatory Visit (HOSPITAL_COMMUNITY)
Admission: RE | Admit: 2022-07-09 | Discharge: 2022-07-09 | Disposition: A | Discharge status: Home / Self Care | Payer: 59 | Source: Ambulatory Visit | Attending: Gastroenterology | Admitting: Gastroenterology

## 2022-07-09 DIAGNOSIS — R1319 Other dysphagia: Secondary | ICD-10-CM

## 2022-07-09 NOTE — Progress Notes (Signed)
Cardiology Office Note:    Date:  07/12/2022   ID:  Karen [name], DOB 1945/05/16, MRN [id number]  PCP:  [facility] Cardiologist:  Larae Grooms, MD Electrophysiologist:  Virl Axe, MD     Referring MD: [facility], [facility]   Chief Complaint: CAD s/p PCI  History of Present Illness:    Karen [name] is a very pleasant 77 y.o. female with a hx of CAD s/p multiple MIs, CABG, cardiac arrest s/p AICD implant, HLD, tobacco abuse, aortic atherosclerosis, CKD, vit B12 deficiency, anemia, left lung CA s/p SBRT Aug 17, 2020,  GERD, and GIB.   Complicated cardiac history.  She had cardiac arrest in early 1998/08/18.  At that time she had an AICD placed in [place] by Dr. Chuck Hint. In 2005-08-17, she had another cardiac arrest at the time of hospitalization. Cardiac cath showed an occluded left main which was angioplastied.  She then went to emergency CABG. since that time she has had chronic chest discomfort.  Repeat cardiac cath 08-18-2006 showed the grafts were closed.  Her native left main had opened.  She had some other branch vessel disease for which she has been managed medically. Felt improvement on Ranexa but it was not covered by insurance.   Husband passed away in 08-17-17. Diagnosed with lung cancer in 08-17-2020 "Stage 1B (T1b, N0, M0) non-small cell lung cancer, adenocarcinoma presented with left lower lobe lung nodule."  Admission 1/10-1/18/24 for painless lower GI bleeding found to have acute bleeding from left colic/sigmoid artery branch treated with coil embolization by interventional radiology. She developed inferior lateral wall STEMI on 1/10.  LHC revealed high-grade in-stent stenosis proximal SVG to distal RCA which was stented. Repeat cath for recurrent chest pain 1/13 with more distal DES placement. Advised to continue indefinite Plavix, aspirin for 30 days in light of her recent bleeding, Imdur, high intensity statin, and lisinopril 10 mg daily, not on  beta-blocker due to low blood pressure acute inpatient rehab was recommended but declined by insurance. She was discharged home with PT and OT.  Seen in clinic by me on 05/31/22 for follow-up. Was feeling well. Having occasional chest pain that worsens when she sits down. Occurred 3 x this week and sometimes associated with sweating. Symptoms usually improve with 1 SL NTG. On one occasion she took 2 ntg at which time she also had symptoms of presyncope. No syncope. She has chronic shortness of breath that she feels is stable. Pain is not associated with shortness of breath. Started having RLE edema about 1 week ago, no orthopnea or PND. Had inadvertently run out of Plavix. Admits she has not been very active. No cigarettes since 05/07/22. No melena, no evidence of bleeding. LE venous doppler 2/12 was negative for DVT. Carotid u/s ordered for bruit revealed normal blood flow in subclavian and carotid arteries. She was advised to return in 1 month for evaluation of angina. She was advised to stop aspirin on 06/13/22.   Today, she is here for follow-up.  She is here alone and reports she is doing well. Remains active at home cooking and cleaning, spends time with family. Reports chest pain has been better since I last saw her. She has occasional discomfort which she feels is stable. Had run out of Ranexa recently. Has not smoked since 06/05/22.  Continues to have mild bilateral LE edema that usually improves overnight, but sometimes it is still there. No orthopnea, weight gain, PND. Feels that dyspnea on exertion  is stable.  She does a little bit around the house and then rests. Symptoms improve. No bleeding concerns.  We discussed soft BP, thinks it has been this way for a long time. Occasional lightheadedness, no presyncope or syncope. Is getting ready to start cardiac rehab.  Past Medical History:  Diagnosis Date   Acute myocardial infarction, unspecified site, episode of care unspecified    Adenocarcinoma, lung  ([place]) 02/16/2021   Allergic rhinitis, cause unspecified    CAD (coronary artery disease)    GERD (gastroesophageal reflux disease)    Headache(784.0)    History of radiation therapy    left lung SBRT 02/08/2021, 02/13/2021, 02/15/2021  Dr Gery Pray   HTN (hypertension)    Other and unspecified hyperlipidemia    Other diseases of lung, not elsewhere classified    Postsurgical aortocoronary bypass status    hx of it.     Past Surgical History:  Procedure Laterality Date   CARDIAC DEFIBRILLATOR PLACEMENT  03/2006   [employer]. remote-yes.    CORONARY ARTERY BYPASS GRAFT     x4   CORONARY STENT INTERVENTION N/A 05/11/2022   Procedure: CORONARY STENT INTERVENTION;  Surgeon: Sherren Mocha, MD;  Location: [place] [facility];  Service: Cardiovascular;  Laterality: N/A;   CORONARY/GRAFT ACUTE MI REVASCULARIZATION N/A 05/08/2022   Procedure: Coronary/Graft Acute MI Revascularization;  Surgeon: Troy Sine, MD;  Location: [place] [facility];  Service: Cardiovascular;  Laterality: N/A;   IMPLANTABLE CARDIOVERTER DEFIBRILLATOR GENERATOR CHANGE N/A 03/03/2013   Procedure: IMPLANTABLE CARDIOVERTER DEFIBRILLATOR GENERATOR CHANGE;  Surgeon: Deboraha Sprang, MD;  Location: [facility];  Service: Cardiovascular;  Laterality: N/A;   IR ANGIOGRAM VISCERAL SELECTIVE  05/06/2022   IR AORTAGRAM ABDOMINAL SERIALOGRAM  05/06/2022   IR EMBO ART  VEN HEMORR LYMPH EXTRAV  INC GUIDE ROADMAPPING  05/06/2022   IR US GUIDE VASC ACCESS RIGHT  05/06/2022   LEFT HEART CATH AND CORONARY ANGIOGRAPHY N/A 05/08/2022   Procedure: LEFT HEART CATH AND CORONARY ANGIOGRAPHY;  Surgeon: Troy Sine, MD;  Location: [place] [facility];  Service: Cardiovascular;  Laterality: N/A;   LEFT HEART CATH AND CORS/GRAFTS ANGIOGRAPHY N/A 05/11/2022   Procedure: LEFT HEART CATH AND CORS/GRAFTS ANGIOGRAPHY;  Surgeon: Sherren Mocha, MD;  Location: [place] [facility];  Service: Cardiovascular;  Laterality: N/A;   stent implant     x1. possibly  x2.     Current Medications: Current Meds  Medication Sig   acetaminophen (TYLENOL) 500 MG tablet Take 500 mg by mouth every 6 (six) hours as needed for headache.   albuterol (VENTOLIN HFA) 108 (90 Base) MCG/ACT inhaler Inhale 2 puffs into the lungs every 6 (six) hours as needed for wheezing or shortness of breath.   cyanocobalamin (VITAMIN B12) 1000 MCG tablet Take 1 tablet (1,000 mcg total) by mouth daily.   cyclobenzaprine (FLEXERIL) 10 MG tablet Take 10 mg by mouth 3 (three) times daily as needed for muscle spasms.   fluticasone (FLONASE) 50 MCG/ACT nasal spray Place 1 spray into both nostrils daily. (Patient taking differently: Place 1 spray into both nostrils daily as needed for allergies.)   naloxone (NARCAN) nasal spray 4 mg/0.1 mL SMARTSIG:Both Nares   nitroGLYCERIN (NITROSTAT) 0.4 MG SL tablet Place 1 tablet (0.4 mg total) under the tongue every 5 (five) minutes as needed for chest pain (X3 DOSES MAX).   oxyCODONE-acetaminophen (PERCOCET) 7.5-325 MG tablet Take 1-2 tablets by mouth every 4 (four) hours as needed for moderate pain (back pain).  polyethylene glycol (MIRALAX / GLYCOLAX) 17 g packet Take 17 g by mouth daily.   [DISCONTINUED] amLODipine (NORVASC) 5 MG tablet Take 5 mg by mouth daily.   [DISCONTINUED] clopidogrel (PLAVIX) 75 MG tablet Take 1 tablet (75 mg total) by mouth daily.   [DISCONTINUED] furosemide (LASIX) 40 MG tablet Take 1 tablet (40 mg total) by mouth daily.   [DISCONTINUED] isosorbide mononitrate (IMDUR) 60 MG 24 hr tablet Take 1 tablet (60 mg total) by mouth 2 (two) times daily.   [DISCONTINUED] lisinopril (ZESTRIL) 10 MG tablet TAKE 1 TABLET BY MOUTH DAILY   [DISCONTINUED] pantoprazole (PROTONIX) 40 MG tablet Take 1 tablet (40 mg total) by mouth 2 (two) times daily.   [DISCONTINUED] ranolazine (RANEXA) 500 MG 12 hr tablet Take 500 mg by mouth 2 (two) times daily.   [DISCONTINUED] rosuvastatin (CRESTOR) 20 MG tablet Take 1 tablet (20 mg total) by mouth daily.      Allergies:   Codeine and Novocain [procaine]   Social History   Socioeconomic History   Marital status: Legally Separated    Spouse name: Not on file   Number of children: Not on file   Years of education: Not on file   Highest education level: Not on file  Occupational History   Not on file  Tobacco Use   Smoking status: Some Days    Packs/day: 0.50    Years: 54.00    Additional pack years: 0.00    Total pack years: 27.00    Types: Cigarettes    Start date: 1968   Smokeless tobacco: Never   Tobacco comments:    2 cigarettes smoked daily. 03/28/22 ARJ   Vaping Use   Vaping Use: Never used  Substance and Sexual Activity   Alcohol use: No   Drug use: No   Sexual activity: Not on file  Other Topics Concern   Not on file  Social History Narrative   Married, 4 children.    Social Determinants of Health   Financial Resource Strain: Not on file  Food Insecurity: No Food Insecurity (05/06/2022)   Hunger Vital Sign    Worried About Running Out of Food in the Last Year: Never true    Ran Out of Food in the Last Year: Never true  Transportation Needs: No Transportation Needs (05/06/2022)   PRAPARE - Hydrologist (Medical): No    Lack of Transportation (Non-Medical): No  Physical Activity: Not on file  Stress: Not on file  Social Connections: Not on file     Family History: The patient's family history includes Breast cancer in her maternal aunt; Heart attack in her mother; Heart disease in her mother.  ROS:   Please see the history of present illness.    + occasional chest pain + RLE edema + chronic shortness of breath All other systems reviewed and are negative.  Labs/Other Studies Reviewed:    The following studies were reviewed today:  Carotid Duplex 06/10/22 Summary:  Right Carotid: Velocities in the right ICA are consistent with a 1-39%  stenosis.                Non-hemodynamically significant plaque <50% noted in the  CCA.    Left Carotid: Velocities in the left ICA are consistent with a 1-39%  stenosis.               Non-hemodynamically significant plaque <50% noted in the  CCA.   Vertebrals: Bilateral vertebral arteries demonstrate antegrade flow.  Subclavians: Normal flow hemodynamics were seen in bilateral subclavian               arteries.   *See table(s) above for measurements and observations.   LHC 05/08/22  Ost LAD to Prox LAD lesion is 30% stenosed.   Ramus lesion is 40% stenosed.   Prox Cx to Mid Cx lesion is 75% stenosed.   Mid RCA lesion is 70% stenosed.   Origin lesion is 85% stenosed.   Prox Graft to Mid Graft lesion is 40% stenosed.   Mid Graft lesion is 60% stenosed.   Dist Graft lesion is 70% stenosed.   Dist LAD lesion is 30% stenosed.   Balloon angioplasty was performed.   Post intervention, there is a 25% residual stenosis.   LV end diastolic pressure is low.   Acute coronary syndrome with transient ST segment elevation in the inferior and anterolateral leads secondary to high-grade stenosis in a stent in the proximal SVG supplying the distal RCA.   The left main coronary artery is widely patent status post remote intervention.  The LAD has 30% smooth proximal stenosis and 30% distal stenosis.  There is antegrade flow down the diagonal and LAD without competitive filling.   The ramus intermediate vessel has smooth 40% proximal stenosis.   Left circumflex vessel has eccentric 75% stenosis on a bend in the vessel.   The native RCA has a previously placed stent region of the acute margin and it appears that the distal RCA does not feel from the segment.   The SVG supplying the distal RCA is patent but diffusely diseased.  There appears to be a stent in the very proximal portion of the graft with focal 85 to 90% stenosis just at the beginning of this stent.  The RCA graft has diffuse 40 6070% stenoses throughout.  The vessel fills the distal RCA PDA and PLA branches.   Atretic  LIMA graft which had supplied the LAD.   Occluded vein graft which had supplied the circumflex vessel.   Occluded vein graft which had supplied the diagonal vessel.   LVEDP 6 mmHg.   Successful percutaneous coronary intervention with PTCA in the very proximal portion of the previously placed SVG stent with the 85 to 90% stenosis being reduced to 25%.   RECOMMENDATON: Continue DAPT indefinitely.  Apparently, Plavix had been held for several days due to the patient's recent lower GI bleed which was successfully treated with coil embolization by interventional radiology.  Aggressive lipid management with target LDL < 55.   Diagnostic Dominance: Right  Intervention     Echo 03/29/22 1. Left ventricular ejection fraction, by estimation, is 50 to 55%. The  left ventricle has low normal function. The left ventricle demonstrates  regional wall motion abnormalities (see scoring diagram/findings for  description). Left ventricular diastolic   parameters were normal.   2. Right ventricular systolic function is normal. The right ventricular  size is normal.   3. The mitral valve is normal in structure. Mild mitral valve  regurgitation. No evidence of mitral stenosis.   4. The aortic valve is tricuspid. Aortic valve regurgitation is not  visualized. No aortic stenosis is present.   5. The inferior vena cava is normal in size with greater than 50%  respiratory variability, suggesting right atrial pressure of 3 mmHg.  Recent Labs: 05/06/2022: TSH 1.004 05/13/2022: Magnesium 1.9 07/01/2022: ALT 11; BUN 15; Creatinine 1.16; Hemoglobin 12.0; Platelet Count 198; Potassium 4.0; Sodium 141  Recent Lipid Panel  Component Value Date/Time   CHOL 142 06/06/2022 1418   TRIG 41 06/06/2022 1418   HDL 71 06/06/2022 1418   CHOLHDL 2.0 06/06/2022 1418   CHOLHDL 2.5 05/10/2022 0038   VLDL 10 05/10/2022 0038   LDLCALC 61 06/06/2022 1418   LDLDIRECT 82 01/15/2018 1536     Risk  Assessment/Calculations:       Physical Exam:    VS:  BP (!) 100/58   Pulse (!) 102   Ht 5\' 3"  (1.6 m)   Wt 153 lb 12.8 oz (69.8 kg)   SpO2 99%   BMI 27.24 kg/m     Wt Readings from Last 3 Encounters:  07/12/22 153 lb 12.8 oz (69.8 kg)  07/03/22 156 lb 2 oz (70.8 kg)  05/31/22 156 lb 9.6 oz (71 kg)     GEN:  Well nourished, well developed in no acute distress HEENT: Normal NECK: No JVD; right carotid bruit CARDIAC: RRR, no murmurs, rubs, gallops RESPIRATORY:  Clear to auscultation without rales, wheezing or rhonchi  ABDOMEN: Soft, non-tender, non-distended MUSCULOSKELETAL:  No edema; No deformity. 2+ pedal pulses, equal bilaterally SKIN: Warm and dry NEUROLOGIC:  Alert and oriented x 3 PSYCHIATRIC:  Normal affect   EKG:  EKG is not ordered today  Diagnoses:    1. Coronary artery disease of bypass graft of native heart with stable angina pectoris ([place])   2. Tobacco use   3. Hyperlipidemia LDL goal <70   4. Implantable cardioverter-defibrillator (ICD) in situ   5. Ventricular fibrillation ([place])   6. Essential hypertension   7. Right leg swelling   8. Bilateral carotid artery stenosis   9. Heartburn     Assessment and Plan:     CAD with stable angina: S/p STEMI 1/10 with DES to SVG-RCA and subsequent DES to distal segment on 1/13. On Plavix alone indefinitely. Overall feeling well. Reports chest pain that she feels is stable. DOE is chronic and felt to be stable. Was out of ranolazine for a few days. No significant worsening of chest pain.  We have refilled her medications. No bleeding concerns. Continue GDMT including Plavix, Imdur, metoprolol, Crestor, amlodipine, Ranexa.   VT s/p AICD implant: Normal device function, no episodes of VT on remote device check 05/27/22. Continue remote device checks. Management per EP. Continue metoprolol.   Right lower extremity swelling: Reports she continues to have RLE edema on occasion. No edema noted today. No DVT on u/s  06/10/22. We are reducing amlodipine due to soft BP. I am hopeful that she will have less edema on lower dose.   Carotid artery disease: Carotid duplex 06/10/2022 with 1 to 39% stenosis bilaterally. Asymptomatic. Will repeat when clinically indicated or in 3-5 years.   Dyspnea on exertion: Chronic DOE that she feels is stable. No PND, orthopnea. History of left lower lobe lung cancer s/p SBRT last treatment 02/15/21. Management per oncology/pulmonology.   Tobacco use: Has not smoked in > 1 month, very little prior to that since hospitalization. I congratulated her on this achievement. Complete cessation advised  Hypertension: BP is well controlled, somewhat soft. She is requesting refills of medications. Had previously reported amlodipine had been stopped but reports today that she taking amlodipine 10 mg daily.  Will have her reduce amlodipine to 5 mg daily. Continue lisinopril, Imdur, Lasix, metoprolol.  Hyperlipidemia LDL goal < 55: LDL 61 on 06/06/22. Well controlled. Continue rosuvastatin.   Heartburn: Reports worsening heartburn since she ran out of pantoprazole. Advised her of harmful side effects  of long term PPI use. Advised her to take 40 mg once daily and then wean down and gradually d/c. Advised further management per PCP.    Disposition: 6 months with Dr. Irish Lack  Medication Adjustments/Labs and Tests Ordered: Current medicines are reviewed at length with the patient today.  Concerns regarding medicines are outlined above.  No orders of the defined types were placed in this encounter.  Meds ordered this encounter  Medications   clopidogrel (PLAVIX) 75 MG tablet    Sig: Take 1 tablet (75 mg total) by mouth daily.    Dispense:  90 tablet    Refill:  3   furosemide (LASIX) 40 MG tablet    Sig: Take 1 tablet (40 mg total) by mouth daily.    Dispense:  90 tablet    Refill:  3    Dose increase   isosorbide mononitrate (IMDUR) 60 MG 24 hr tablet    Sig: Take 1 tablet (60 mg total)  by mouth 2 (two) times daily.    Dispense:  180 tablet    Refill:  3   lisinopril (ZESTRIL) 10 MG tablet    Sig: Take 1 tablet (10 mg total) by mouth daily.    Dispense:  90 tablet    Refill:  3   DISCONTD: pantoprazole (PROTONIX) 40 MG tablet    Sig: Take 1 tablet (40 mg total) by mouth 2 (two) times daily.    Dispense:  60 tablet    Refill:  0   rosuvastatin (CRESTOR) 20 MG tablet    Sig: Take 1 tablet (20 mg total) by mouth daily.    Dispense:  90 tablet    Refill:  3   ranolazine (RANEXA) 500 MG 12 hr tablet    Sig: Take 1 tablet (500 mg total) by mouth 2 (two) times daily.    Dispense:  180 tablet    Refill:  3   amLODipine (NORVASC) 5 MG tablet    Sig: Take 1 tablet (5 mg total) by mouth daily.    Dispense:  90 tablet    Refill:  3   pantoprazole (PROTONIX) 40 MG tablet    Sig: Take 1 tablet (40 mg total) by mouth daily.    Dispense:  30 tablet    Refill:  0    Patient Instructions  Medication Instructions:   TAKE Protonix one (1) tablet by mouth (40 mg) daily X 1 month than go to every other day X 1 month than 2-3 times weekly than stop when you run out.   DECREASED Amlodipine one (1) tablet by mouth ( 5 mg) daily.   *If you need a refill on your cardiac medications before your next appointment, please call your pharmacy*   Lab Work:  None ordered.  If you have labs (blood work) drawn today and your tests are completely normal, you will receive your results only by: [place] (if you have MyChart) OR A paper copy in the mail If you have any lab test that is abnormal or we need to change your treatment, we will call you to review the results.   Testing/Procedures:  None ordered.   Follow-Up: At [facility], you and your health needs are our priority.  As part of our continuing mission to provide you with exceptional heart care, we have created designated Provider Care Teams.  These Care Teams include your primary Cardiologist (physician)  and Advanced Practice Providers (APPs -  Physician Assistants and Nurse Practitioners) who all  work together to provide you with the care you need, when you need it.  We recommend signing up for the patient portal called "MyChart".  Sign up information is provided on this After Visit Summary.  MyChart is used to connect with patients for Virtual Visits (Telemedicine).  Patients are able to view lab/test results, encounter notes, upcoming appointments, etc.  Non-urgent messages can be sent to your provider as well.   To learn more about what you can do with MyChart, go to [email or web address].    Your next appointment:   6 month(s)  Provider:   Larae Grooms, MD        Signed, Emmaline Life, NP  07/12/2022 3:08 PM    [place]

## 2022-07-10 NOTE — Progress Notes (Signed)
Remote ICD transmission.   

## 2022-07-12 ENCOUNTER — Encounter: Payer: Self-pay | Admitting: Nurse Practitioner

## 2022-07-12 ENCOUNTER — Ambulatory Visit: Payer: 59 | Attending: Nurse Practitioner | Admitting: Nurse Practitioner

## 2022-07-12 ENCOUNTER — Other Ambulatory Visit: Payer: Self-pay | Admitting: Nurse Practitioner

## 2022-07-12 VITALS — BP 100/58 | HR 102 | Ht 63.0 in | Wt 153.8 lb

## 2022-07-12 DIAGNOSIS — I6523 Occlusion and stenosis of bilateral carotid arteries: Secondary | ICD-10-CM

## 2022-07-12 DIAGNOSIS — Z720 Tobacco use: Secondary | ICD-10-CM | POA: Diagnosis not present

## 2022-07-12 DIAGNOSIS — E785 Hyperlipidemia, unspecified: Secondary | ICD-10-CM

## 2022-07-12 DIAGNOSIS — R12 Heartburn: Secondary | ICD-10-CM

## 2022-07-12 DIAGNOSIS — I25708 Atherosclerosis of coronary artery bypass graft(s), unspecified, with other forms of angina pectoris: Secondary | ICD-10-CM | POA: Diagnosis not present

## 2022-07-12 DIAGNOSIS — M7989 Other specified soft tissue disorders: Secondary | ICD-10-CM

## 2022-07-12 DIAGNOSIS — Z95810 Presence of automatic (implantable) cardiac defibrillator: Secondary | ICD-10-CM

## 2022-07-12 DIAGNOSIS — I4901 Ventricular fibrillation: Secondary | ICD-10-CM

## 2022-07-12 DIAGNOSIS — I10 Essential (primary) hypertension: Secondary | ICD-10-CM

## 2022-07-12 MED ORDER — ROSUVASTATIN CALCIUM 20 MG PO TABS: 20.0000 mg | ORAL_TABLET | Freq: Every day | ORAL | 3 refills | Status: DC

## 2022-07-12 MED ORDER — ISOSORBIDE MONONITRATE ER 60 MG PO TB24: 60.0000 mg | ORAL_TABLET | Freq: Two times a day (BID) | ORAL | 3 refills | Status: DC

## 2022-07-12 MED ORDER — LISINOPRIL 10 MG PO TABS: 10.0000 mg | ORAL_TABLET | Freq: Every day | ORAL | 3 refills | Status: DC

## 2022-07-12 MED ORDER — RANOLAZINE ER 500 MG PO TB12: 500.0000 mg | ORAL_TABLET | Freq: Two times a day (BID) | ORAL | 3 refills | Status: DC

## 2022-07-12 MED ORDER — PANTOPRAZOLE SODIUM 40 MG PO TBEC: 40.0000 mg | DELAYED_RELEASE_TABLET | Freq: Every day | ORAL | 0 refills | Status: DC

## 2022-07-12 MED ORDER — CLOPIDOGREL BISULFATE 75 MG PO TABS: 75.0000 mg | ORAL_TABLET | Freq: Every day | ORAL | 3 refills | Status: DC

## 2022-07-12 MED ORDER — AMLODIPINE BESYLATE 5 MG PO TABS: 5.0000 mg | ORAL_TABLET | Freq: Every day | ORAL | 3 refills | Status: DC

## 2022-07-12 MED ORDER — FUROSEMIDE 40 MG PO TABS: 40.0000 mg | ORAL_TABLET | Freq: Every day | ORAL | 3 refills | Status: DC

## 2022-07-12 MED ORDER — PANTOPRAZOLE SODIUM 40 MG PO TBEC: 40.0000 mg | DELAYED_RELEASE_TABLET | Freq: Two times a day (BID) | ORAL | 0 refills | Status: DC

## 2022-07-12 NOTE — Patient Instructions (Signed)
Medication Instructions:   TAKE Protonix one (1) tablet by mouth (40 mg) daily X 1 month than go to every other day X 1 month than 2-3 times weekly than stop when you run out.   DECREASED Amlodipine one (1) tablet by mouth ( 5 mg) daily.   *If you need a refill on your cardiac medications before your next appointment, please call your pharmacy*   Lab Work:  None ordered.  If you have labs (blood work) drawn today and your tests are completely normal, you will receive your results only by: [place] (if you have MyChart) OR A paper copy in the mail If you have any lab test that is abnormal or we need to change your treatment, we will call you to review the results.   Testing/Procedures:  None ordered.   Follow-Up: At [facility], you and your health needs are our priority.  As part of our continuing mission to provide you with exceptional heart care, we have created designated Provider Care Teams.  These Care Teams include your primary Cardiologist (physician) and Advanced Practice Providers (APPs -  Physician Assistants and Nurse Practitioners) who all work together to provide you with the care you need, when you need it.  We recommend signing up for the patient portal called "MyChart".  Sign up information is provided on this After Visit Summary.  MyChart is used to connect with patients for Virtual Visits (Telemedicine).  Patients are able to view lab/test results, encounter notes, upcoming appointments, etc.  Non-urgent messages can be sent to your provider as well.   To learn more about what you can do with MyChart, go to [email or web address].    Your next appointment:   6 month(s)  Provider:   Larae Grooms, MD

## 2022-07-15 ENCOUNTER — Telehealth (HOSPITAL_COMMUNITY): Payer: Self-pay

## 2022-07-15 NOTE — Telephone Encounter (Signed)
LVM for patient to confirm her Cardiac Rehab Orientation appointment. Awaiting call back.

## 2022-07-16 ENCOUNTER — Encounter (HOSPITAL_COMMUNITY)
Admission: RE | Admit: 2022-07-16 | Discharge: 2022-07-16 | Disposition: A | Discharge status: Home / Self Care | Payer: 59 | Source: Ambulatory Visit | Attending: Interventional Cardiology | Admitting: Interventional Cardiology

## 2022-07-16 ENCOUNTER — Other Ambulatory Visit: Payer: Self-pay | Admitting: *Deleted

## 2022-07-16 ENCOUNTER — Telehealth: Payer: Self-pay | Admitting: *Deleted

## 2022-07-16 ENCOUNTER — Encounter (HOSPITAL_COMMUNITY): Payer: Self-pay

## 2022-07-16 VITALS — BP 116/70 | HR 80 | Ht 63.0 in | Wt 155.0 lb

## 2022-07-16 DIAGNOSIS — Z955 Presence of coronary angioplasty implant and graft: Secondary | ICD-10-CM | POA: Insufficient documentation

## 2022-07-16 DIAGNOSIS — Z9861 Coronary angioplasty status: Secondary | ICD-10-CM

## 2022-07-16 DIAGNOSIS — I2111 ST elevation (STEMI) myocardial infarction involving right coronary artery: Secondary | ICD-10-CM | POA: Insufficient documentation

## 2022-07-16 MED ORDER — METOPROLOL TARTRATE 25 MG PO TABS: 12.5000 mg | ORAL_TABLET | Freq: Two times a day (BID) | ORAL | 3 refills | Status: DC

## 2022-07-16 NOTE — Telephone Encounter (Signed)
Lvm for pt to call office

## 2022-07-16 NOTE — Progress Notes (Addendum)
Medications reviewed. Patient said she has been taking previously prescribed metoprolol twice a day and ran out of the medication 2 weeks ago. Kitti says she has not been able to get a refill on the prescription. Metoprolol was not listed on her current medication list. Instant messaged Christen Bame NP   hello Sharyn Lull Ms Stumpf is asking about her metoprolol. She said that she was taking it and ran out two weeks ago. It looks like her metoprolol was discontinued when she was in the hospital in January due to low BP. her BP is 116/70 this morning. I hope its okay to reach out to you about this since you have seen her twice since her discharge from the hospital  11:46 AM I asked her to bring her bottles in for [place] to verify medications and doses when she starts next week  11:47 AM Tamsen Snider was added by Emmaline Life, NP. 11:54 AM MS Emmaline Life, NP Thanks Verdis Frederickson, I explained to her that she did not the metoprolol and she was supposed to call the pharmacy for refill. I will likely decrease lisinopril if BP remains low so that she has room for a little bit of BB. I will ask Danielle, my CMA, to call her. Thanks!   11:54 AM . Instant messaged to Christen Bame NP.  Harrell Gave RN BSN

## 2022-07-16 NOTE — Progress Notes (Signed)
Intermittent frequent PVC's, couplets noted during 6 minute nustep test. Patient asymptomatic. Notified Christen Bame NP via instant message Sharyn Lull Ms Brennick did have some PVc's and couplets that were frequent at times during her 6 minute nustep test.. it looks like she has had PVC's and couplets in the past from her hospital monitoring she has an ICD. Would you like me to send the ECG tracings for your review via media? Ms desorbo was asymptomatic.  12:34 PM MS Emmaline Life, NP No, it is ok. Andee Poles has reached out to her to get her back on metoprolol so hopefully that helps the PVCs   Will continue to monitor the patient throughout  the program. Harrell Gave RN BSN

## 2022-07-16 NOTE — Progress Notes (Signed)
Cardiac Rehab Medication Review   Does the patient  feel that his/her medications are working for him/her?  yes  Has the patient been experiencing any side effects to the medications prescribed?  no  Does the patient measure his/her own blood pressure or blood glucose at home?  yes   Does the patient have any problems obtaining medications due to transportation or finances?   no  Understanding of regimen: good Understanding of indications: good Potential of compliance: good    Comments: Pt voices that she checks her blood pressures 3x/week. Pt voices no other issues with medications.    Lesly Rubenstein 07/16/2022 2:07 PM

## 2022-07-16 NOTE — Telephone Encounter (Signed)
Pt calling back.  S/w pt will start lopressor one half tablet (12.5 mg) bid.  Stated if bp gets to low cardiac rehab will report to [facility] and will advise on decreasing lisinopril.

## 2022-07-16 NOTE — Progress Notes (Signed)
Cardiac Individual Treatment Plan  Patient Details  Name: Karen [name] MRN: [id number] Date of Birth: 1945/12/26 Referring Provider:   Flowsheet Row INTENSIVE CARDIAC REHAB ORIENT from 07/16/2022 in [facility] [place]  Referring Provider Larae Grooms, MD       Initial Encounter Date:  [place] from 07/16/2022 in [facility]  Date 07/16/22       Visit Diagnosis: 05/08/22 ST elevation myocardial infarction  05/08/22 S/P PTCA (percutaneous transluminal coronary angioplasty)  10/13 /23 S/P coronary artery stent placement x 3 RCA Graft  Patient's Home Medications on Admission:  Current Outpatient Medications:    acetaminophen (TYLENOL) 500 MG tablet, Take 500 mg by mouth every 6 (six) hours as needed for headache., Disp: , Rfl:    albuterol (VENTOLIN HFA) 108 (90 Base) MCG/ACT inhaler, Inhale 2 puffs into the lungs every 6 (six) hours as needed for wheezing or shortness of breath., Disp: 54 g, Rfl: 3   amLODipine (NORVASC) 5 MG tablet, Take 1 tablet (5 mg total) by mouth daily., Disp: 90 tablet, Rfl: 3   clopidogrel (PLAVIX) 75 MG tablet, Take 1 tablet (75 mg total) by mouth daily., Disp: 90 tablet, Rfl: 3   cyanocobalamin (VITAMIN B12) 1000 MCG tablet, Take 1 tablet (1,000 mcg total) by mouth daily., Disp: 30 tablet, Rfl: 0   cyclobenzaprine (FLEXERIL) 10 MG tablet, Take 10 mg by mouth 3 (three) times daily as needed for muscle spasms., Disp: , Rfl:    fluticasone (FLONASE) 50 MCG/ACT nasal spray, Place 1 spray into both nostrils daily. (Patient taking differently: Place 1 spray into both nostrils daily as needed for allergies.), Disp: 18.2 mL, Rfl: 2   furosemide (LASIX) 40 MG tablet, Take 1 tablet (40 mg total) by mouth daily., Disp: 90 tablet, Rfl: 3   isosorbide mononitrate (IMDUR) 60 MG 24 hr tablet, Take 1 tablet (60 mg total) by  mouth 2 (two) times daily., Disp: 180 tablet, Rfl: 3   lisinopril (ZESTRIL) 10 MG tablet, Take 1 tablet (10 mg total) by mouth daily., Disp: 90 tablet, Rfl: 3   metoprolol tartrate (LOPRESSOR) 25 MG tablet, Take 0.5 tablets (12.5 mg total) by mouth 2 (two) times daily., Disp: 90 tablet, Rfl: 3   naloxone (NARCAN) nasal spray 4 mg/0.1 mL, SMARTSIG:Both Nares, Disp: , Rfl:    nitroGLYCERIN (NITROSTAT) 0.4 MG SL tablet, Place 1 tablet (0.4 mg total) under the tongue every 5 (five) minutes as needed for chest pain (X3 DOSES MAX)., Disp: 25 tablet, Rfl: 11   oxyCODONE-acetaminophen (PERCOCET) 7.5-325 MG tablet, Take 1-2 tablets by mouth every 4 (four) hours as needed for moderate pain (back pain)., Disp: , Rfl:    pantoprazole (PROTONIX) 40 MG tablet, Take 1 tablet (40 mg total) by mouth daily., Disp: 30 tablet, Rfl: 0   polyethylene glycol (MIRALAX / GLYCOLAX) 17 g packet, Take 17 g by mouth daily., Disp: 14 each, Rfl: 0   ranolazine (RANEXA) 500 MG 12 hr tablet, Take 1 tablet (500 mg total) by mouth 2 (two) times daily., Disp: 180 tablet, Rfl: 3   rosuvastatin (CRESTOR) 20 MG tablet, Take 1 tablet (20 mg total) by mouth daily., Disp: 90 tablet, Rfl: 3  Past Medical History: Past Medical History:  Diagnosis Date   Acute myocardial infarction, unspecified site, episode of care unspecified    Adenocarcinoma, 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     Tobacco Use: Social History   Tobacco Use  Smoking Status Former   Packs/day: 0.50   Years: 54.00   Additional pack years: 0.00   Total pack years: 27.00    Types: Cigarettes   Start date: 1968   Quit date: 06/05/2022   Years since quitting: 0.1  Smokeless Tobacco Never  Tobacco Comments   Quit smoking on 06/05/22    Labs: Review Flowsheet  More data exists      Latest Ref Rng & Units 02/05/2018 08/16/2019 02/16/2021 05/10/2022 06/06/2022  Labs for ITP Cardiac and Pulmonary Rehab  Cholestrol 100 - 199 mg/dL 160  165  181  91  142   LDL (calc) 0 - 99 mg/dL 71  80  84  45  61   HDL-C >39 mg/dL 74  74  82  36  71   Trlycerides 0 - 149 mg/dL 74  54  73  48  41   Hemoglobin A1c 4.8 - 5.6 % - - 6.0  - -    Capillary Blood Glucose: Lab Results  Component Value Date   GLUCAP 114 (H) 02/22/2022   GLUCAP 116 (H) 12/09/2021   GLUCAP 92 12/08/2020     Exercise Target Goals: Exercise Program Goal: Individual exercise prescription set using results from initial 6 min walk test and THRR while considering  patient's activity barriers and safety.   Exercise Prescription Goal: Initial exercise prescription builds to 30-45 minutes a day of aerobic activity, 2-3 days per week.  Home exercise guidelines will be given to patient during program as part of exercise prescription that the participant will acknowledge.  Activity Barriers & Risk Stratification:  Activity Barriers & Cardiac Risk Stratification - 07/16/22 1355       Activity Barriers & Cardiac Risk Stratification   Activity Barriers Arthritis;Back Problems;Joint Problems;Deconditioning;Muscular Weakness;Chest Pain/Angina;Balance Concerns;Assistive Device    Cardiac Risk Stratification High             6 Minute Walk:  6 Minute Walk     Row Name 07/16/22 1354         6 Minute Walk   Phase --  NUSTEP used     Distance 1049 feet  NUSTEP test     Walk Time 6 minutes     # of Rest Breaks 0     MPH 2     METS 1.8     RPE 11     Perceived Dyspnea  0     VO2 Peak 6.3     Symptoms No     Resting HR 74 bpm     Resting BP 116/70     Resting Oxygen Saturation  100 %     Exercise  Oxygen Saturation  during 6 min walk 100 %     Max Ex. HR 84 bpm     Max Ex. BP 114/66     2 Minute Post BP 110/60              Oxygen Initial Assessment:   Oxygen Re-Evaluation:   Oxygen Discharge (Final Oxygen  Re-Evaluation):   Initial Exercise Prescription:  Initial Exercise Prescription - 07/16/22 1300       Date of Initial Exercise RX and Referring Provider   Date 07/16/22    Referring Provider Larae Grooms, MD    Expected Discharge Date 09/27/22      NuStep   Level 1    SPM 65    Minutes 25    METs 1.8      Prescription Details   Frequency (times per week) 3    Duration Progress to 30 minutes of continuous aerobic without signs/symptoms of physical distress      Intensity   THRR 40-80% of Max Heartrate 58-115    Ratings of Perceived Exertion 11-13    Perceived Dyspnea 0-4      Progression   Progression Continue progressive overload as per policy without signs/symptoms or physical distress.      Resistance Training   Training Prescription Yes    Weight 2 lbs    Reps 10-15             Perform Capillary Blood Glucose checks as needed.  Exercise Prescription Changes:   Exercise Comments:   Exercise Goals and Review:   Exercise Goals     Row Name 07/16/22 1356             Exercise Goals   Increase Physical Activity Yes       Intervention Provide advice, education, support and counseling about physical activity/exercise needs.;Develop an individualized exercise prescription for aerobic and resistive training based on initial evaluation findings, risk stratification, comorbidities and participant's personal goals.       Expected Outcomes Long Term: Exercising regularly at least 3-5 days a week.;Long Term: Add in home exercise to make exercise part of routine and to increase amount of physical activity.;Short Term: Attend rehab on a regular basis to increase amount of physical activity.       Increase Strength and Stamina Yes        Intervention Provide advice, education, support and counseling about physical activity/exercise needs.;Develop an individualized exercise prescription for aerobic and resistive training based on initial evaluation findings, risk stratification, comorbidities and participant's personal goals.       Expected Outcomes Long Term: Improve cardiorespiratory fitness, muscular endurance and strength as measured by increased METs and functional capacity (6MWT);Short Term: Perform resistance training exercises routinely during rehab and add in resistance training at home;Short Term: Increase workloads from initial exercise prescription for resistance, speed, and METs.       Able to understand and use rate of perceived exertion (RPE) scale Yes       Intervention Provide education and explanation on how to use RPE scale       Expected Outcomes Short Term: Able to use RPE daily in rehab to express subjective intensity level;Long Term:  Able to use RPE to guide intensity level when exercising independently       Knowledge and understanding of Target Heart Rate Range (THRR) Yes       Intervention Provide education and explanation of THRR including how the numbers were predicted and where they are located for reference       Expected Outcomes Short Term: Able to state/look up THRR;Short Term: Able to use daily as guideline for intensity in rehab;Long Term: Able to use THRR to govern intensity when exercising independently       Understanding of Exercise Prescription Yes       Intervention Provide education, explanation, and written materials  on patient's individual exercise prescription       Expected Outcomes Short Term: Able to explain program exercise prescription;Long Term: Able to explain home exercise prescription to exercise independently                Exercise Goals Re-Evaluation :   Discharge Exercise Prescription (Final Exercise Prescription Changes):   Nutrition:  Target Goals: Understanding of  nutrition guidelines, daily intake of sodium 1500mg , cholesterol 200mg , calories 30% from fat and 7% or less from saturated fats, daily to have 5 or more servings of fruits and vegetables.  Biometrics:  Pre Biometrics - 07/16/22 1050       Pre Biometrics   Waist Circumference 37 inches    Hip Circumference 42 inches    Waist to Hip Ratio 0.88 %    Triceps Skinfold 25 mm    % Body Fat 39.7 %    Grip Strength 30 kg    Flexibility --   Not performed due to chronic back problems   Single Leg Stand --   Not performed: Pt uses cane/walk and has impaired balance             Nutrition Therapy Plan and Nutrition Goals:   Nutrition Assessments:  MEDIFICTS Score Key: ?70 Need to make dietary changes  40-70 Heart Healthy Diet ? 40 Therapeutic Level Cholesterol Diet    Picture Your Plate Scores: [id number] Unhealthy dietary pattern with much room for improvement. 41-50 Dietary pattern unlikely to meet recommendations for good health and room for improvement. 51-60 More healthful dietary pattern, with some room for improvement.  >60 Healthy dietary pattern, although there may be some specific behaviors that could be improved.    Nutrition Goals Re-Evaluation:   Nutrition Goals Re-Evaluation:   Nutrition Goals Discharge (Final Nutrition Goals Re-Evaluation):   Psychosocial: Target Goals: Acknowledge presence or absence of significant depression and/or stress, maximize coping skills, provide positive support system. Participant is able to verbalize types and ability to use techniques and skills needed for reducing stress and depression.  Initial Review & Psychosocial Screening:  Initial Psych Review & Screening - 07/16/22 1349       Initial Review   Current issues with None Identified      Family Dynamics   Good Support System? Yes    Comments Pt has her 2 sons and her granddaughter for support      Barriers   Psychosocial barriers to participate in program There are no  identifiable barriers or psychosocial needs.      Screening Interventions   Interventions Encouraged to exercise             Quality of Life Scores:  Quality of Life - 07/16/22 1352       Quality of Life   Select Quality of Life      Quality of Life Scores   Health/Function Pre 24.32 %    Socioeconomic Pre 29 %    Psych/Spiritual Pre 30 %    Family Pre 28.8 %    GLOBAL Pre 27.14 %            Scores of 19 and below usually indicate a poorer quality of life in these areas.  A difference of  2-3 points is a clinically meaningful difference.  A difference of 2-3 points in the total score of the Quality of Life Index has been associated with significant improvement in overall quality of life, self-image, physical symptoms, and general health in studies assessing change in quality  of life.  PHQ-9: Review Flowsheet       07/16/2022  Depression screen PHQ 2/9  Decreased Interest 0  Down, Depressed, Hopeless 0  PHQ - 2 Score 0  Altered sleeping 0  Tired, decreased energy 0  Change in appetite 0  Feeling bad or failure about yourself  0  Trouble concentrating 0  Moving slowly or fidgety/restless 0  Suicidal thoughts 0  PHQ-9 Score 0  Difficult doing work/chores Not difficult at all   Interpretation of Total Score  Total Score Depression Severity:  1-4 = Minimal depression, 5-9 = Mild depression, 10-14 = Moderate depression, 15-19 = Moderately severe depression, 20-27 = Severe depression   Psychosocial Evaluation and Intervention:   Psychosocial Re-Evaluation:   Psychosocial Discharge (Final Psychosocial Re-Evaluation):   Vocational Rehabilitation: Provide vocational rehab assistance to qualifying candidates.   Vocational Rehab Evaluation & Intervention:  Vocational Rehab - 07/16/22 1350       Initial Vocational Rehab Evaluation & Intervention   Assessment shows need for Vocational Rehabilitation No   Pt is retired            Education: Education  Goals: Education classes will be provided on a weekly basis, covering required topics. Participant will state understanding/return demonstration of topics presented.     Core Videos: Exercise    Move It!  Clinical staff conducted group or individual video education with verbal and written material and guidebook.  Patient learns the recommended Pritikin exercise program. Exercise with the goal of living a long, healthy life. Some of the health benefits of exercise include controlled diabetes, healthier blood pressure levels, improved cholesterol levels, improved heart and lung capacity, improved sleep, and better body composition. Everyone should speak with their doctor before starting or changing an exercise routine.  Biomechanical Limitations Clinical staff conducted group or individual video education with verbal and written material and guidebook.  Patient learns how biomechanical limitations can impact exercise and how we can mitigate and possibly overcome limitations to have an impactful and balanced exercise routine.  Body Composition Clinical staff conducted group or individual video education with verbal and written material and guidebook.  Patient learns that body composition (ratio of muscle mass to fat mass) is a key component to assessing overall fitness, rather than body weight alone. Increased fat mass, especially visceral belly fat, can put [place] at increased risk for metabolic syndrome, type 2 diabetes, heart disease, and even death. It is recommended to combine diet and exercise (cardiovascular and resistance training) to improve your body composition. Seek guidance from your physician and exercise physiologist before implementing an exercise routine.  Exercise Action Plan Clinical staff conducted group or individual video education with verbal and written material and guidebook.  Patient learns the recommended strategies to achieve and enjoy long-term exercise adherence, including  variety, self-motivation, self-efficacy, and positive decision making. Benefits of exercise include fitness, good health, weight management, more energy, better sleep, less stress, and overall well-being.  Medical   Heart Disease Risk Reduction Clinical staff conducted group or individual video education with verbal and written material and guidebook.  Patient learns our heart is our most vital organ as it circulates oxygen, nutrients, white blood cells, and hormones throughout the entire body, and carries waste away. Data supports a plant-based eating plan like the Pritikin Program for its effectiveness in slowing progression of and reversing heart disease. The video provides a number of recommendations to address heart disease.   Metabolic Syndrome and Belly Fat  Clinical staff conducted group  or individual video education with verbal and written material and guidebook.  Patient learns what metabolic syndrome is, how it leads to heart disease, and how one can reverse it and keep it from coming back. You have metabolic syndrome if you have 3 of the following 5 criteria: abdominal obesity, high blood pressure, high triglycerides, low HDL cholesterol, and high blood sugar.  Hypertension and Heart Disease Clinical staff conducted group or individual video education with verbal and written material and guidebook.  Patient learns that high blood pressure, or hypertension, is very common in the [place]. Hypertension is largely due to excessive salt intake, but other important risk factors include being overweight, physical inactivity, drinking too much alcohol, smoking, and not eating enough potassium from fruits and vegetables. High blood pressure is a leading risk factor for heart attack, stroke, congestive heart failure, dementia, kidney failure, and premature death. Long-term effects of excessive salt intake include stiffening of the arteries and thickening of heart muscle and organ damage.  Recommendations include ways to reduce hypertension and the risk of heart disease.  Diseases of Our Time - Focusing on Diabetes Clinical staff conducted group or individual video education with verbal and written material and guidebook.  Patient learns why the best way to stop diseases of our time is prevention, through food and other lifestyle changes. Medicine (such as prescription pills and surgeries) is often only a Band-Aid on the problem, not a long-term solution. Most common diseases of our time include obesity, type 2 diabetes, hypertension, heart disease, and cancer. The Pritikin Program is recommended and has been proven to help reduce, reverse, and/or prevent the damaging effects of metabolic syndrome.  Nutrition   Overview of the Pritikin Eating Plan  Clinical staff conducted group or individual video education with verbal and written material and guidebook.  Patient learns about the [place] for disease risk reduction. The [place] emphasizes a wide variety of unrefined, minimally-processed carbohydrates, like fruits, vegetables, whole grains, and legumes. Go, Caution, and Stop food choices are explained. Plant-based and lean animal proteins are emphasized. Rationale provided for low sodium intake for blood pressure control, low added sugars for blood sugar stabilization, and low added fats and oils for coronary artery disease risk reduction and weight management.  Calorie Density  Clinical staff conducted group or individual video education with verbal and written material and guidebook.  Patient learns about calorie density and how it impacts the Pritikin Eating Plan. Knowing the characteristics of the food you choose will help you decide whether those foods will lead to weight gain or weight loss, and whether you want to consume more or less of them. Weight loss is usually a side effect of the Pritikin Eating Plan because of its focus on low calorie-dense  foods.  Label Reading  Clinical staff conducted group or individual video education with verbal and written material and guidebook.  Patient learns about the Pritikin recommended label reading guidelines and corresponding recommendations regarding calorie density, added sugars, sodium content, and whole grains.  Dining Out - Part 1  Clinical staff conducted group or individual video education with verbal and written material and guidebook.  Patient learns that restaurant meals can be sabotaging because they can be so high in calories, fat, sodium, and/or sugar. Patient learns recommended strategies on how to positively address this and avoid unhealthy pitfalls.  Facts on Fats  Clinical staff conducted group or individual video education with verbal and written material and guidebook.  Patient learns that  lifestyle modifications can be just as effective, if not more so, as many medications for lowering your risk of heart disease. A Pritikin lifestyle can help to reduce your risk of inflammation and atherosclerosis (cholesterol build-up, or plaque, in the artery walls). Lifestyle interventions such as dietary choices and physical activity address the cause of atherosclerosis. A review of the types of fats and their impact on blood cholesterol levels, along with dietary recommendations to reduce fat intake is also included.  Nutrition Action Plan  Clinical staff conducted group or individual video education with verbal and written material and guidebook.  Patient learns how to incorporate Pritikin recommendations into their lifestyle. Recommendations include planning and keeping personal health goals in mind as an important part of their success.  Healthy Mind-Set    Healthy Minds, Bodies, Hearts  Clinical staff conducted group or individual video education with verbal and written material and guidebook.  Patient learns how to identify when they are stressed. Video will discuss the impact of that  stress, as well as the many benefits of stress management. Patient will also be introduced to stress management techniques. The way we think, act, and feel has an impact on our hearts.  How Our Thoughts Can Heal Our Hearts  Clinical staff conducted group or individual video education with verbal and written material and guidebook.  Patient learns that negative thoughts can cause depression and anxiety. This can result in negative lifestyle behavior and serious health problems. Cognitive behavioral therapy is an effective method to help control our thoughts in order to change and improve our emotional outlook.  Additional Videos:  Exercise    Improving Performance  Clinical staff conducted group or individual video education with verbal and written material and guidebook.  Patient learns to use a non-linear approach by alternating intensity levels and lengths of time spent exercising to help burn more calories and lose more body fat. Cardiovascular exercise helps improve heart health, metabolism, hormonal balance, blood sugar control, and recovery from fatigue. Resistance training improves strength, endurance, balance, coordination, reaction time, metabolism, and muscle mass. Flexibility exercise improves circulation, posture, and balance. Seek guidance from your physician and exercise physiologist before implementing an exercise routine and learn your capabilities and proper form for all exercise.  Introduction to Yoga  Clinical staff conducted group or individual video education with verbal and written material and guidebook.  Patient learns about yoga, a discipline of the coming together of mind, breath, and body. The benefits of yoga include improved flexibility, improved range of motion, better posture and core strength, increased lung function, weight loss, and positive self-image. Yoga's heart health benefits include lowered blood pressure, healthier heart rate, decreased cholesterol and  triglyceride levels, improved immune function, and reduced stress. Seek guidance from your physician and exercise physiologist before implementing an exercise routine and learn your capabilities and proper form for all exercise.  Medical   Aging: Enhancing Your Quality of Life  Clinical staff conducted group or individual video education with verbal and written material and guidebook.  Patient learns key strategies and recommendations to stay in good physical health and enhance quality of life, such as prevention strategies, having an advocate, securing a [place], and keeping a list of medications and system for tracking them. It also discusses how to avoid risk for bone loss.  Biology of Weight Control  Clinical staff conducted group or individual video education with verbal and written material and guidebook.  Patient learns that weight gain occurs  because we consume more calories than we burn (eating more, moving less). Even if your body weight is normal, you may have higher ratios of fat compared to muscle mass. Too much body fat puts you at increased risk for cardiovascular disease, heart attack, stroke, type 2 diabetes, and obesity-related cancers. In addition to exercise, following the [place] can help reduce your risk.  Decoding Lab Results  Clinical staff conducted group or individual video education with verbal and written material and guidebook.  Patient learns that lab test reflects one measurement whose values change over time and are influenced by many factors, including medication, stress, sleep, exercise, food, hydration, pre-existing medical conditions, and more. It is recommended to use the knowledge from this video to become more involved with your lab results and evaluate your numbers to speak with your doctor.   Diseases of Our Time - Overview  Clinical staff conducted group or individual video education with verbal and written  material and guidebook.  Patient learns that according to the CDC, 50% to 70% of chronic diseases (such as obesity, type 2 diabetes, elevated lipids, hypertension, and heart disease) are avoidable through lifestyle improvements including healthier food choices, listening to satiety cues, and increased physical activity.  Sleep Disorders Clinical staff conducted group or individual video education with verbal and written material and guidebook.  Patient learns how good quality and duration of sleep are important to overall health and well-being. Patient also learns about sleep disorders and how they impact health along with recommendations to address them, including discussing with a physician.  Nutrition  Dining Out - Part 2 Clinical staff conducted group or individual video education with verbal and written material and guidebook.  Patient learns how to plan ahead and communicate in order to maximize their dining experience in a healthy and nutritious manner. Included are recommended food choices based on the type of restaurant the patient is visiting.   Fueling a Best boy conducted group or individual video education with verbal and written material and guidebook.  There is a strong connection between our food choices and our health. Diseases like obesity and type 2 diabetes are very prevalent and are in large-part due to lifestyle choices. The Pritikin Eating Plan provides plenty of food and hunger-curbing satisfaction. It is easy to follow, affordable, and helps reduce health risks.  Menu Workshop  Clinical staff conducted group or individual video education with verbal and written material and guidebook.  Patient learns that restaurant meals can sabotage health goals because they are often packed with calories, fat, sodium, and sugar. Recommendations include strategies to plan ahead and to communicate with the manager, chef, or server to help order a healthier  meal.  Planning Your Eating Strategy  Clinical staff conducted group or individual video education with verbal and written material and guidebook.  Patient learns about the [place] and its benefit of reducing the risk of disease. The [place] does not focus on calories. Instead, it emphasizes high-quality, nutrient-rich foods. By knowing the characteristics of the foods, we choose, we can determine their calorie density and make informed decisions.  Targeting Your Nutrition Priorities  Clinical staff conducted group or individual video education with verbal and written material and guidebook.  Patient learns that lifestyle habits have a tremendous impact on disease risk and progression. This video provides eating and physical activity recommendations based on your personal health goals, such as reducing LDL cholesterol, losing weight, preventing or controlling type 2  diabetes, and reducing high blood pressure.  Vitamins and Minerals  Clinical staff conducted group or individual video education with verbal and written material and guidebook.  Patient learns different ways to obtain key vitamins and minerals, including through a recommended healthy diet. It is important to discuss all supplements you take with your doctor.   Healthy Mind-Set    Smoking Cessation  Clinical staff conducted group or individual video education with verbal and written material and guidebook.  Patient learns that cigarette smoking and tobacco addiction pose a serious health risk which affects millions of people. Stopping smoking will significantly reduce the risk of heart disease, lung disease, and many forms of cancer. Recommended strategies for quitting are covered, including working with your doctor to develop a successful plan.  Culinary   Becoming a Financial trader conducted group or individual video education with verbal and written material and guidebook.  Patient learns  that cooking at home can be healthy, cost-effective, quick, and puts them in control. Keys to cooking healthy recipes will include looking at your recipe, assessing your equipment needs, planning ahead, making it simple, choosing cost-effective seasonal ingredients, and limiting the use of added fats, salts, and sugars.  Cooking - Breakfast and Snacks  Clinical staff conducted group or individual video education with verbal and written material and guidebook.  Patient learns how important breakfast is to satiety and nutrition through the entire day. Recommendations include key foods to eat during breakfast to help stabilize blood sugar levels and to prevent overeating at meals later in the day. Planning ahead is also a key component.  Cooking - Human resources officer conducted group or individual video education with verbal and written material and guidebook.  Patient learns eating strategies to improve overall health, including an approach to cook more at home. Recommendations include thinking of animal protein as a side on your plate rather than center stage and focusing instead on lower calorie dense options like vegetables, fruits, whole grains, and plant-based proteins, such as beans. Making sauces in large quantities to freeze for later and leaving the skin on your vegetables are also recommended to maximize your experience.  Cooking - Healthy Salads and Dressing Clinical staff conducted group or individual video education with verbal and written material and guidebook.  Patient learns that vegetables, fruits, whole grains, and legumes are the foundations of the [place]. Recommendations include how to incorporate each of these in flavorful and healthy salads, and how to create homemade salad dressings. Proper handling of ingredients is also covered. Cooking - Soups and [employer] - Soups and Desserts Clinical staff conducted group or individual video education with  verbal and written material and guidebook.  Patient learns that Pritikin soups and desserts make for easy, nutritious, and delicious snacks and meal components that are low in sodium, fat, sugar, and calorie density, while high in vitamins, minerals, and filling fiber. Recommendations include simple and healthy ideas for soups and desserts.   Overview     The Pritikin Solution Program Overview Clinical staff conducted group or individual video education with verbal and written material and guidebook.  Patient learns that the results of the [place] Program have been documented in more than 100 articles published in peer-reviewed journals, and the benefits include reducing risk factors for (and, in some cases, even reversing) high cholesterol, high blood pressure, type 2 diabetes, obesity, and more! An overview of the three key pillars of the Pritikin Program will be  covered: eating well, doing regular exercise, and having a healthy mind-set.  WORKSHOPS  Exercise: Exercise Basics: Building Your Action Plan Clinical staff led group instruction and group discussion with PowerPoint presentation and patient guidebook. To enhance the learning environment the use of posters, models and videos may be added. At the conclusion of this workshop, patients will comprehend the difference between physical activity and exercise, as well as the benefits of incorporating both, into their routine. Patients will understand the FITT (Frequency, Intensity, Time, and Type) principle and how to use it to build an exercise action plan. In addition, safety concerns and other considerations for exercise and cardiac rehab will be addressed by the presenter. The purpose of this lesson is to promote a comprehensive and effective weekly exercise routine in order to improve patients' overall level of fitness.   Managing Heart Disease: Your Path to a Healthier Heart Clinical staff led group instruction and group discussion with  PowerPoint presentation and patient guidebook. To enhance the learning environment the use of posters, models and videos may be added.At the conclusion of this workshop, patients will understand the anatomy and physiology of the heart. Additionally, they will understand how Pritikin's three pillars impact the risk factors, the progression, and the management of heart disease.  The purpose of this lesson is to provide a high-level overview of the heart, heart disease, and how the Pritikin lifestyle positively impacts risk factors.  Exercise Biomechanics Clinical staff led group instruction and group discussion with PowerPoint presentation and patient guidebook. To enhance the learning environment the use of posters, models and videos may be added. Patients will learn how the structural parts of their bodies function and how these functions impact their daily activities, movement, and exercise. Patients will learn how to promote a neutral spine, learn how to manage pain, and identify ways to improve their physical movement in order to promote healthy living. The purpose of this lesson is to expose patients to common physical limitations that impact physical activity. Participants will learn practical ways to adapt and manage aches and pains, and to minimize their effect on regular exercise. Patients will learn how to maintain good posture while sitting, walking, and lifting.  Balance Training and Fall Prevention  Clinical staff led group instruction and group discussion with PowerPoint presentation and patient guidebook. To enhance the learning environment the use of posters, models and videos may be added. At the conclusion of this workshop, patients will understand the importance of their sensorimotor skills (vision, proprioception, and the vestibular system) in maintaining their ability to balance as they age. Patients will apply a variety of balancing exercises that are appropriate for their  current level of function. Patients will understand the common causes for poor balance, possible solutions to these problems, and ways to modify their physical environment in order to minimize their fall risk. The purpose of this lesson is to teach patients about the importance of maintaining balance as they age and ways to minimize their risk of falling.  WORKSHOPS   Nutrition:  Fueling a Scientist, research (physical sciences) led group instruction and group discussion with PowerPoint presentation and patient guidebook. To enhance the learning environment the use of posters, models and videos may be added. Patients will review the foundational principles of the [place] and understand what constitutes a serving size in each of the food groups. Patients will also learn Pritikin-friendly foods that are better choices when away from home and review make-ahead meal and snack options. Calorie density will  be reviewed and applied to three nutrition priorities: weight maintenance, weight loss, and weight gain. The purpose of this lesson is to reinforce (in a group setting) the key concepts around what patients are recommended to eat and how to apply these guidelines when away from home by planning and selecting Pritikin-friendly options. Patients will understand how calorie density may be adjusted for different weight management goals.  Mindful Eating  Clinical staff led group instruction and group discussion with PowerPoint presentation and patient guidebook. To enhance the learning environment the use of posters, models and videos may be added. Patients will briefly review the concepts of the [place] and the importance of low-calorie dense foods. The concept of mindful eating will be introduced as well as the importance of paying attention to internal hunger signals. Triggers for non-hunger eating and techniques for dealing with triggers will be explored. The purpose of this lesson is to provide  patients with the opportunity to review the basic principles of the [place], discuss the value of eating mindfully and how to measure internal cues of hunger and fullness using the Hunger Scale. Patients will also discuss reasons for non-hunger eating and learn strategies to use for controlling emotional eating.  Targeting Your Nutrition Priorities Clinical staff led group instruction and group discussion with PowerPoint presentation and patient guidebook. To enhance the learning environment the use of posters, models and videos may be added. Patients will learn how to determine their genetic susceptibility to disease by reviewing their family history. Patients will gain insight into the importance of diet as part of an overall healthy lifestyle in mitigating the impact of genetics and other environmental insults. The purpose of this lesson is to provide patients with the opportunity to assess their personal nutrition priorities by looking at their family history, their own health history and current risk factors. Patients will also be able to discuss ways of prioritizing and modifying the [place] for their highest risk areas  Menu  Clinical staff led group instruction and group discussion with PowerPoint presentation and patient guidebook. To enhance the learning environment the use of posters, models and videos may be added. Using menus brought in from [employer], or printed from [employer], patients will apply the [place] dining out guidelines that were presented in the [employer] video. Patients will also be able to practice these guidelines in a variety of provided scenarios. The purpose of this lesson is to provide patients with the opportunity to practice hands-on learning of the [place] with actual menus and practice scenarios.  Label Reading Clinical staff led group instruction and group discussion with PowerPoint  presentation and patient guidebook. To enhance the learning environment the use of posters, models and videos may be added. Patients will review and discuss the Pritikin label reading guidelines presented in Pritikin's Label Reading Educational series video. Using fool labels brought in from local grocery stores and markets, patients will apply the label reading guidelines and determine if the packaged food meet the Pritikin guidelines. The purpose of this lesson is to provide patients with the opportunity to review, discuss, and practice hands-on learning of the Pritikin Label Reading guidelines with actual packaged food labels. [place] Workshops are designed to teach patients ways to prepare quick, simple, and affordable recipes at home. The importance of nutrition's role in chronic disease risk reduction is reflected in its emphasis in the overall Pritikin program. By learning how to  prepare essential core Pritikin Eating Plan recipes, patients will increase control over what they eat; be able to customize the flavor of foods without the use of added salt, sugar, or fat; and improve the quality of the food they consume. By learning a set of core recipes which are easily assembled, quickly prepared, and affordable, patients are more likely to prepare more healthy foods at home. These workshops focus on convenient breakfasts, simple entres, side dishes, and desserts which can be prepared with minimal effort and are consistent with nutrition recommendations for cardiovascular risk reduction. Cooking [employer] are taught by a Engineer, materials (RD) who has been trained by the [employer]. The chef or RD has a clear understanding of the importance of minimizing - if not completely eliminating - added fat, sugar, and sodium in recipes. Throughout the series of [place] Workshop sessions, patients will learn about healthy ingredients and  efficient methods of cooking to build confidence in their capability to prepare    Cooking School weekly topics:  Adding Flavor- Sodium-Free  Fast and Healthy Breakfasts  Powerhouse Plant-Based Proteins  Satisfying Salads and Dressings  Simple Sides and Sauces  International Cuisine-Spotlight on the [place] Zones  Delicious Desserts  Savory Soups  [employer] - Meals in a Agricultural consultant Appetizers and Snacks  Comforting Weekend Breakfasts  One-Pot Wonders   Fast Evening Meals  Contractor Your Pritikin Plate  WORKSHOPS   Healthy Mindset (Psychosocial):  Focused Goals, Sustainable Changes Clinical staff led group instruction and group discussion with PowerPoint presentation and patient guidebook. To enhance the learning environment the use of posters, models and videos may be added. Patients will be able to apply effective goal setting strategies to establish at least one personal goal, and then take consistent, meaningful action toward that goal. They will learn to identify common barriers to achieving personal goals and develop strategies to overcome them. Patients will also gain an understanding of how our mind-set can impact our ability to achieve goals and the importance of cultivating a positive and growth-oriented mind-set. The purpose of this lesson is to provide patients with a deeper understanding of how to set and achieve personal goals, as well as the tools and strategies needed to overcome common obstacles which may arise along the way.  From Head to Heart: The Power of a Healthy Outlook  Clinical staff led group instruction and group discussion with PowerPoint presentation and patient guidebook. To enhance the learning environment the use of posters, models and videos may be added. Patients will be able to recognize and describe the impact of emotions and mood on physical health. They will discover the importance of self-care and explore self-care  practices which may work for them. Patients will also learn how to utilize the 4 C's to cultivate a healthier outlook and better manage stress and challenges. The purpose of this lesson is to demonstrate to patients how a healthy outlook is an essential part of maintaining good health, especially as they continue their cardiac rehab journey.  Healthy Sleep for a Healthy Heart Clinical staff led group instruction and group discussion with PowerPoint presentation and patient guidebook. To enhance the learning environment the use of posters, models and videos may be added. At the conclusion of this workshop, patients will be able to demonstrate knowledge of the importance of sleep to overall health, well-being, and quality of life. They will understand the symptoms of, and treatments for, common sleep disorders. Patients will  also be able to identify daytime and nighttime behaviors which impact sleep, and they will be able to apply these tools to help manage sleep-related challenges. The purpose of this lesson is to provide patients with a general overview of sleep and outline the importance of quality sleep. Patients will learn about a few of the most common sleep disorders. Patients will also be introduced to the concept of "sleep hygiene," and discover ways to self-manage certain sleeping problems through simple daily behavior changes. Finally, the workshop will motivate patients by clarifying the links between quality sleep and their goals of heart-healthy living.   Recognizing and Reducing Stress Clinical staff led group instruction and group discussion with PowerPoint presentation and patient guidebook. To enhance the learning environment the use of posters, models and videos may be added. At the conclusion of this workshop, patients will be able to understand the types of stress reactions, differentiate between acute and chronic stress, and recognize the impact that chronic stress has on their health. They  will also be able to apply different coping mechanisms, such as reframing negative self-talk. Patients will have the opportunity to practice a variety of stress management techniques, such as deep abdominal breathing, progressive muscle relaxation, and/or guided imagery.  The purpose of this lesson is to educate patients on the role of stress in their lives and to provide healthy techniques for coping with it.  Learning Barriers/Preferences:  Learning Barriers/Preferences - 07/16/22 1352       Learning Barriers/Preferences   Learning Barriers Sight;Hearing    Learning Preferences Pictoral;Computer/Internet;Video             Education Topics:  Knowledge Questionnaire Score:  Knowledge Questionnaire Score - 07/16/22 1404       Knowledge Questionnaire Score   Pre Score 24/28             Core Components/Risk Factors/Patient Goals at Admission:  Personal Goals and Risk Factors at Admission - 07/16/22 1402       Core Components/Risk Factors/Patient Goals on Admission   Tobacco Cessation --    Number of packs per day --    Intervention --    Expected Outcomes --             Core Components/Risk Factors/Patient Goals Review:    Core Components/Risk Factors/Patient Goals at Discharge (Final Review):    ITP Comments:  ITP Comments     Row Name 07/16/22 1110           ITP Comments Fransico Him, MD - Medical Director.  Introduction to the [facility] / Intensive Cardiac Rehab. Initial orientation packet reviewed with the patient.                Comments: Participant attended orientation for the cardiac rehabilitation program on  07/16/2022  to perform initial intake and exercise walk test. Patient introduced to the [place] education and orientation packet was reviewed. Completed 6-minute Nustep test due to gait and back issues, measurements, initial ITP, and exercise prescription. Vital signs stable. Telemetry-normal sinus rhythm, PVC's,  couplets, bigeminy, pt asymptomatic.   Service time was from 10:23 to 12:48.

## 2022-07-16 NOTE — Telephone Encounter (Signed)
Karen [name], Cardiac Rehab sent Christen Bame, NP a message regarding pt's Lopressor, pt has not had in 2 weeks. Advised with Lorenda Peck pt is to restart Lopressor at new dose one half (0.5) tablet by mouth ( 12.5 mg) bid. Sent in new script and lvm for pt to call back to go over directions.

## 2022-07-16 NOTE — Progress Notes (Deleted)
Cardiac Rehab Medication Review by a Nurse  Does the patient  feel that his/her medications are working for him/her?  yes  Has the patient been experiencing any side effects to the medications prescribed?  no  Does the patient measure his/her own blood pressure or blood glucose at home?  yes   Does the patient have any problems obtaining medications due to transportation or finances?   no  Understanding of regimen: good Understanding of indications: good Potential of compliance: good    Nurse comments: Karen [name] Demitris Pokorny 07/16/2022 11:37 AM

## 2022-07-22 ENCOUNTER — Telehealth (HOSPITAL_COMMUNITY): Payer: Self-pay | Admitting: *Deleted

## 2022-07-22 ENCOUNTER — Encounter (HOSPITAL_COMMUNITY): Payer: 59

## 2022-07-22 NOTE — Telephone Encounter (Signed)
Patient left message on department voicemail this morning. She will be absent from cardiac rehab today because her son is having surgery. Requested a call back.

## 2022-07-22 NOTE — Telephone Encounter (Signed)
Left message to call cardiac rehab 

## 2022-07-24 ENCOUNTER — Encounter (HOSPITAL_COMMUNITY)
Admission: RE | Admit: 2022-07-24 | Discharge: 2022-07-24 | Disposition: A | Discharge status: Home / Self Care | Payer: 59 | Source: Ambulatory Visit | Attending: Interventional Cardiology | Admitting: Interventional Cardiology

## 2022-07-24 DIAGNOSIS — I2111 ST elevation (STEMI) myocardial infarction involving right coronary artery: Secondary | ICD-10-CM

## 2022-07-24 DIAGNOSIS — Z9861 Coronary angioplasty status: Secondary | ICD-10-CM

## 2022-07-24 NOTE — Progress Notes (Addendum)
Daily Session Note  Patient Details  Name: Karen [name] MRN: [id number] Date of Birth: Jun 29, 1945 Referring Provider:   Flowsheet Row INTENSIVE CARDIAC REHAB ORIENT from 07/16/2022 in [facility] [place]  Referring Provider Larae Grooms, MD       Encounter Date: 07/24/2022  Check In:  Session Check In - 07/24/22 1324       Check-In   Supervising physician immediately available to respond to emergencies [facility] MD immediately available    Physician(s) Christen Bame, NP    Location [facility]    Staff Present Barnet Pall, RN, Rico Ala, MS, Exercise Physiologist;Olinty Celesta Aver, MS, ACSM-CEP, Exercise Physiologist;Johnny Starleen Blue, MS, Exercise Physiologist;Jetta Walker BS, ACSM-CEP, Exercise Physiologist    Virtual Visit No    Medication changes reported     No    Fall or balance concerns reported    No    Tobacco Cessation No Change    Warm-up and Cool-down Performed as group-led instruction    Resistance Training Performed No    VAD Patient? No    PAD/SET Patient? No      Pain Assessment   Currently in Pain? Yes    Pain Score 5     Pain Location Shoulder    Pain Orientation Left    Multiple Pain Sites No             Capillary Blood Glucose: No results found for this or any previous visit (from the past 24 hour(s)).   Exercise Prescription Changes - 07/24/22 1400       Response to Exercise   Blood Pressure (Admit) 108/72    Blood Pressure (Exercise) 122/60    Blood Pressure (Exit) 112/54    Heart Rate (Admit) 65 bpm    Heart Rate (Exercise) 80 bpm    Heart Rate (Exit) 74 bpm    Rating of Perceived Exertion (Exercise) 11    Symptoms --   pt reported 5/10 L shoulder pain while exs   Comments --   pt first day   Duration Progress to 30 minutes of  aerobic without signs/symptoms of physical distress    Intensity THRR unchanged      Progression   Progression Continue to progress  workloads to maintain intensity without signs/symptoms of physical distress.    Average METs 1.8      Resistance Training   Training Prescription --   no weights on Wed     NuStep   Level 1    SPM 63    Minutes 27    METs 1.8             Social History   Tobacco Use  Smoking Status Former   Packs/day: 0.50   Years: 54.00   Additional pack years: 0.00   Total pack years: 27.00   Types: Cigarettes   Start date: 75   Quit date: 06/05/2022   Years since quitting: 0.1  Smokeless Tobacco Never  Tobacco Comments   Quit smoking on 06/05/22    Goals Met:  Exercise tolerated well No report of concerns or symptoms today  Goals Unmet:  Not Applicable  Comments: Pt started cardiac rehab today.  Pt tolerated light exercise without difficulty. VSS, telemetry-Sinus Rhythm with  frequent PVC's occasional paced on demand, asymptomatic.  Medication list reconciled. Pt denies barriers to medicaiton compliance. Patient is deconditioned used a rollator for stability. PSYCHOSOCIAL ASSESSMENT:  PHQ-0. Pt exhibits positive coping skills, hopeful outlook with  supportive family. No psychosocial needs identified at this time, no psychosocial interventions necessary.    Pt enjoys playing, bingo, spades and gardening.   Pt oriented to exercise equipment and routine.    Understanding verbalized.Harrell Gave RN BSN     Dr. Fransico Him is Medical Director for Cardiac Rehab at [facility] [place].

## 2022-07-26 ENCOUNTER — Encounter (HOSPITAL_COMMUNITY)
Admission: RE | Admit: 2022-07-26 | Discharge: 2022-07-26 | Disposition: A | Discharge status: Home / Self Care | Payer: 59 | Source: Ambulatory Visit | Attending: Interventional Cardiology | Admitting: Interventional Cardiology

## 2022-07-26 DIAGNOSIS — Z9861 Coronary angioplasty status: Secondary | ICD-10-CM

## 2022-07-26 DIAGNOSIS — I2111 ST elevation (STEMI) myocardial infarction involving right coronary artery: Secondary | ICD-10-CM | POA: Diagnosis not present

## 2022-07-26 DIAGNOSIS — Z955 Presence of coronary angioplasty implant and graft: Secondary | ICD-10-CM

## 2022-07-29 ENCOUNTER — Encounter (HOSPITAL_COMMUNITY)
Admission: RE | Admit: 2022-07-29 | Discharge: 2022-07-29 | Disposition: A | Discharge status: Home / Self Care | Payer: 59 | Source: Ambulatory Visit | Attending: Interventional Cardiology | Admitting: Interventional Cardiology

## 2022-07-29 DIAGNOSIS — Z48812 Encounter for surgical aftercare following surgery on the circulatory system: Secondary | ICD-10-CM | POA: Insufficient documentation

## 2022-07-29 DIAGNOSIS — I252 Old myocardial infarction: Secondary | ICD-10-CM | POA: Diagnosis not present

## 2022-07-29 DIAGNOSIS — Z955 Presence of coronary angioplasty implant and graft: Secondary | ICD-10-CM | POA: Diagnosis present

## 2022-07-29 DIAGNOSIS — Z9861 Coronary angioplasty status: Secondary | ICD-10-CM

## 2022-07-29 DIAGNOSIS — I2111 ST elevation (STEMI) myocardial infarction involving right coronary artery: Secondary | ICD-10-CM

## 2022-07-31 ENCOUNTER — Encounter (HOSPITAL_COMMUNITY): Payer: 59

## 2022-08-02 ENCOUNTER — Encounter (HOSPITAL_COMMUNITY)
Admission: RE | Admit: 2022-08-02 | Discharge: 2022-08-02 | Disposition: A | Discharge status: Home / Self Care | Payer: 59 | Source: Ambulatory Visit | Attending: Interventional Cardiology | Admitting: Interventional Cardiology

## 2022-08-02 DIAGNOSIS — I2111 ST elevation (STEMI) myocardial infarction involving right coronary artery: Secondary | ICD-10-CM

## 2022-08-02 DIAGNOSIS — Z955 Presence of coronary angioplasty implant and graft: Secondary | ICD-10-CM

## 2022-08-02 DIAGNOSIS — Z9861 Coronary angioplasty status: Secondary | ICD-10-CM

## 2022-08-05 ENCOUNTER — Encounter (HOSPITAL_COMMUNITY)
Admission: RE | Admit: 2022-08-05 | Discharge: 2022-08-05 | Disposition: A | Discharge status: Home / Self Care | Payer: 59 | Source: Ambulatory Visit | Attending: Interventional Cardiology | Admitting: Interventional Cardiology

## 2022-08-05 DIAGNOSIS — Z955 Presence of coronary angioplasty implant and graft: Secondary | ICD-10-CM

## 2022-08-05 DIAGNOSIS — Z9861 Coronary angioplasty status: Secondary | ICD-10-CM

## 2022-08-05 DIAGNOSIS — I2111 ST elevation (STEMI) myocardial infarction involving right coronary artery: Secondary | ICD-10-CM

## 2022-08-07 ENCOUNTER — Encounter (HOSPITAL_COMMUNITY)
Admission: RE | Admit: 2022-08-07 | Discharge: 2022-08-07 | Disposition: A | Discharge status: Home / Self Care | Payer: 59 | Source: Ambulatory Visit | Attending: Interventional Cardiology | Admitting: Interventional Cardiology

## 2022-08-07 DIAGNOSIS — Z955 Presence of coronary angioplasty implant and graft: Secondary | ICD-10-CM | POA: Diagnosis not present

## 2022-08-07 DIAGNOSIS — I2111 ST elevation (STEMI) myocardial infarction involving right coronary artery: Secondary | ICD-10-CM

## 2022-08-07 DIAGNOSIS — Z9861 Coronary angioplasty status: Secondary | ICD-10-CM

## 2022-08-08 NOTE — Progress Notes (Signed)
Cardiac Individual Treatment Plan  Patient Details  Name: Karen [name] MRN: [id number] Date of Birth: 07-27-1945 Referring Provider:   Flowsheet Row INTENSIVE CARDIAC REHAB ORIENT from 07/16/2022 in [facility]  Referring Provider Lance Muss, MD       Initial Encounter Date:  Flowsheet Row INTENSIVE CARDIAC REHAB ORIENT from 07/16/2022 in [facility]  Date 07/16/22       Visit Diagnosis: 10/13 /23 S/P coronary artery stent placement x 3 RCA Graft  05/08/22 S/P PTCA (percutaneous transluminal coronary angioplasty)  05/08/22 ST elevation myocardial infarction  Patient's Home Medications on Admission:  Current Outpatient Medications:    metoprolol tartrate (LOPRESSOR) 25 MG tablet, Take 0.5 tablets (12.5 mg total) by mouth 2 (two) times daily., Disp: 90 tablet, Rfl: 3   acetaminophen (TYLENOL) 500 MG tablet, Take 500 mg by mouth every 6 (six) hours as needed for headache., Disp: , Rfl:    albuterol (VENTOLIN HFA) 108 (90 Base) MCG/ACT inhaler, Inhale 2 puffs into the lungs every 6 (six) hours as needed for wheezing or shortness of breath., Disp: 54 g, Rfl: 3   amLODipine (NORVASC) 5 MG tablet, Take 1 tablet (5 mg total) by mouth daily., Disp: 90 tablet, Rfl: 3   clopidogrel (PLAVIX) 75 MG tablet, Take 1 tablet (75 mg total) by mouth daily., Disp: 90 tablet, Rfl: 3   cyanocobalamin (VITAMIN B12) 1000 MCG tablet, Take 1 tablet (1,000 mcg total) by mouth daily., Disp: 30 tablet, Rfl: 0   cyclobenzaprine (FLEXERIL) 10 MG tablet, Take 10 mg by mouth 3 (three) times daily as needed for muscle spasms., Disp: , Rfl:    fluticasone (FLONASE) 50 MCG/ACT nasal spray, Place 1 spray into both nostrils daily. (Patient taking differently: Place 1 spray into both nostrils daily as needed for allergies.), Disp: 18.2 mL, Rfl: 2   furosemide (LASIX) 40 MG tablet, Take 1 tablet (40  mg total) by mouth daily., Disp: 90 tablet, Rfl: 3   isosorbide mononitrate (IMDUR) 60 MG 24 hr tablet, Take 1 tablet (60 mg total) by mouth 2 (two) times daily., Disp: 180 tablet, Rfl: 3   lisinopril (ZESTRIL) 10 MG tablet, Take 1 tablet (10 mg total) by mouth daily., Disp: 90 tablet, Rfl: 3   naloxone (NARCAN) nasal spray 4 mg/0.1 mL, SMARTSIG:Both Nares, Disp: , Rfl:    nitroGLYCERIN (NITROSTAT) 0.4 MG SL tablet, Place 1 tablet (0.4 mg total) under the tongue every 5 (five) minutes as needed for chest pain (X3 DOSES MAX)., Disp: 25 tablet, Rfl: 11   oxyCODONE-acetaminophen (PERCOCET) 7.5-325 MG tablet, Take 1-2 tablets by mouth every 4 (four) hours as needed for moderate pain (back pain)., Disp: , Rfl:    pantoprazole (PROTONIX) 40 MG tablet, Take 1 tablet (40 mg total) by mouth daily., Disp: 30 tablet, Rfl: 0   polyethylene glycol (MIRALAX / GLYCOLAX) 17 g packet, Take 17 g by mouth daily., Disp: 14 each, Rfl: 0   ranolazine (RANEXA) 500 MG 12 hr tablet, Take 1 tablet (500 mg total) by mouth 2 (two) times daily., Disp: 180 tablet, Rfl: 3   rosuvastatin (CRESTOR) 20 MG tablet, Take 1 tablet (20 mg total) by mouth daily., Disp: 90 tablet, Rfl: 3  Past Medical History: Past Medical History:  Diagnosis Date   Acute myocardial infarction, unspecified site, episode of care unspecified    Adenocarcinoma, 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     Tobacco Use: Social History   Tobacco Use  Smoking Status Former   Packs/day: 0.50   Years: 54.00   Additional pack years: 0.00   Total pack years: 27.00    Types: Cigarettes   Start date: 1968   Quit date: 06/05/2022   Years since quitting: 0.1  Smokeless Tobacco Never  Tobacco Comments   Quit smoking on 06/05/22    Labs: Review Flowsheet  More data exists      Latest Ref Rng & Units 02/05/2018 08/16/2019 02/16/2021 05/10/2022 06/06/2022  Labs for ITP Cardiac and Pulmonary Rehab  Cholestrol 100 - 199 mg/dL 160  165  181  91  142   LDL (calc) 0 - 99 mg/dL 71  80  84  45  61   HDL-C >39 mg/dL 74  74  82  36  71   Trlycerides 0 - 149 mg/dL 74  54  73  48  41   Hemoglobin A1c 4.8 - 5.6 % - - 6.0  - -    Capillary Blood Glucose: Lab Results  Component Value Date   GLUCAP 114 (H) 02/22/2022   GLUCAP 116 (H) 12/09/2021   GLUCAP 92 12/08/2020     Exercise Target Goals: Exercise Program Goal: Individual exercise prescription set using results from initial 6 min walk test and THRR while considering  patient's activity barriers and safety.   Exercise Prescription Goal: Initial exercise prescription builds to 30-45 minutes a day of aerobic activity, 2-3 days per week.  Home exercise guidelines will be given to patient during program as part of exercise prescription that the participant will acknowledge.  Activity Barriers & Risk Stratification:  Activity Barriers & Cardiac Risk Stratification - 07/16/22 1355       Activity Barriers & Cardiac Risk Stratification   Activity Barriers Arthritis;Back Problems;Joint Problems;Deconditioning;Muscular Weakness;Chest Pain/Angina;Balance Concerns;Assistive Device    Cardiac Risk Stratification High             6 Minute Walk:  6 Minute Walk     Row Name 07/16/22 1354         6 Minute Walk   Phase --  NUSTEP used     Distance 1049 feet  NUSTEP test     Walk Time 6 minutes     # of Rest Breaks 0     MPH 2     METS 1.8     RPE 11     Perceived Dyspnea  0     VO2 Peak 6.3     Symptoms No     Resting HR 74 bpm     Resting BP 116/70     Resting Oxygen Saturation  100 %     Exercise  Oxygen Saturation  during 6 min walk 100 %     Max Ex. HR 84 bpm     Max Ex. BP 114/66     2 Minute Post BP 110/60              Oxygen Initial Assessment:   Oxygen Re-Evaluation:   Oxygen Discharge (Final Oxygen  Re-Evaluation):   Initial Exercise Prescription:  Initial Exercise Prescription - 07/16/22 1300       Date of Initial Exercise RX and Referring Provider   Date 07/16/22    Referring Provider Lance Muss, MD    Expected Discharge Date 09/27/22      NuStep   Level 1    SPM 65    Minutes 25    METs 1.8      Prescription Details   Frequency (times per week) 3    Duration Progress to 30 minutes of continuous aerobic without signs/symptoms of physical distress      Intensity   THRR 40-80% of Max Heartrate 58-115    Ratings of Perceived Exertion 11-13    Perceived Dyspnea 0-4      Progression   Progression Continue progressive overload as per policy without signs/symptoms or physical distress.      Resistance Training   Training Prescription Yes    Weight 2 lbs    Reps 10-15             Perform Capillary Blood Glucose checks as needed.  Exercise Prescription Changes:   Exercise Prescription Changes     Row Name 07/24/22 1400             Response to Exercise   Blood Pressure (Admit) 108/72       Blood Pressure (Exercise) 122/60       Blood Pressure (Exit) 112/54       Heart Rate (Admit) 65 bpm       Heart Rate (Exercise) 80 bpm       Heart Rate (Exit) 74 bpm       Rating of Perceived Exertion (Exercise) 11       Symptoms --  pt reported 5/10 L shoulder pain while exs       Comments --  pt first day       Duration Progress to 30 minutes of  aerobic without signs/symptoms of physical distress       Intensity THRR unchanged         Progression   Progression Continue to progress workloads to maintain intensity without signs/symptoms of physical distress.       Average METs 1.8         Resistance Training   Training Prescription  --  no weights on Wed         NuStep   Level 1       SPM 63       Minutes 27       METs 1.8                Exercise Comments:   Exercise Comments     Row Name 07/24/22 1437           Exercise Comments Pt had a great first day. Pt completed her exs session despite experiencing 5/10 L shoulder pain. Will continue to monitor and increase her WL as tolerated.                Exercise Goals and Review:   Exercise Goals     Row Name 07/16/22 1356             Exercise Goals   Increase Physical Activity Yes       Intervention Provide advice, education, support and counseling about physical activity/exercise needs.;Develop an individualized exercise prescription for aerobic and resistive training based on initial evaluation findings, risk stratification, comorbidities and participant's personal goals.  Expected Outcomes Long Term: Exercising regularly at least 3-5 days a week.;Long Term: Add in home exercise to make exercise part of routine and to increase amount of physical activity.;Short Term: Attend rehab on a regular basis to increase amount of physical activity.       Increase Strength and Stamina Yes       Intervention Provide advice, education, support and counseling about physical activity/exercise needs.;Develop an individualized exercise prescription for aerobic and resistive training based on initial evaluation findings, risk stratification, comorbidities and participant's personal goals.       Expected Outcomes Long Term: Improve cardiorespiratory fitness, muscular endurance and strength as measured by increased METs and functional capacity ( );Short Term: Perform resistance training exercises routinely during rehab and add in resistance training at home;Short Term: Increase workloads from initial exercise prescription for resistance, speed, and METs.       Able to understand and use rate of perceived exertion (RPE) scale Yes       Intervention Provide  education and explanation on how to use RPE scale       Expected Outcomes Short Term: Able to use RPE daily in rehab to express subjective intensity level;Long Term:  Able to use RPE to guide intensity level when exercising independently       Knowledge and understanding of Target Heart Rate Range (THRR) Yes       Intervention Provide education and explanation of THRR including how the numbers were predicted and where they are located for reference       Expected Outcomes Short Term: Able to state/look up THRR;Short Term: Able to use daily as guideline for intensity in rehab;Long Term: Able to use THRR to govern intensity when exercising independently       Understanding of Exercise Prescription Yes       Intervention Provide education, explanation, and written materials on patient's individual exercise prescription       Expected Outcomes Short Term: Able to explain program exercise prescription;Long Term: Able to explain home exercise prescription to exercise independently                Exercise Goals Re-Evaluation :  Exercise Goals Re-Evaluation     Row Name 07/24/22 1432             Exercise Goal Re-Evaluation   Exercise Goals Review Increase Physical Activity;Increase Strength and Stamina;Able to understand and use rate of perceived exertion (RPE) scale;Knowledge and understanding of Target Heart Rate Range (THRR);Understanding of Exercise Prescription       Comments Pt first day in program she reported 5/10 L shoulder pain on the NuStep, While exercising pt avg 1.8 Mets, will continue to educate pt on her ExRx, THRR, and RPE scale while increase her WL as tolerated       Expected Outcomes Pt is motivated to reach her goals, will continue to educate and increase WL as tolerated.                Discharge Exercise Prescription (Final Exercise Prescription Changes):  Exercise Prescription Changes - 07/24/22 1400       Response to Exercise   Blood Pressure (Admit) 108/72     Blood Pressure (Exercise) 122/60    Blood Pressure (Exit) 112/54    Heart Rate (Admit) 65 bpm    Heart Rate (Exercise) 80 bpm    Heart Rate (Exit) 74 bpm    Rating of Perceived Exertion (Exercise) 11    Symptoms --   pt reported 5/10 L  shoulder pain while exs   Comments --   pt first day   Duration Progress to 30 minutes of  aerobic without signs/symptoms of physical distress    Intensity THRR unchanged      Progression   Progression Continue to progress workloads to maintain intensity without signs/symptoms of physical distress.    Average METs 1.8      Resistance Training   Training Prescription --   no weights on Wed     NuStep   Level 1    SPM 63    Minutes 27    METs 1.8             Nutrition:  Target Goals: Understanding of nutrition guidelines, daily intake of sodium 1500mg , cholesterol 200mg , calories 30% from fat and 7% or less from saturated fats, daily to have 5 or more servings of fruits and vegetables.  Biometrics:  Pre Biometrics - 07/16/22 1050       Pre Biometrics   Waist Circumference 37 inches    Hip Circumference 42 inches    Waist to Hip Ratio 0.88 %    Triceps Skinfold 25 mm    % Body Fat 39.7 %    Grip Strength 30 kg    Flexibility --   Not performed due to chronic back problems   Single Leg Stand --   Not performed: Pt uses cane/walk and has impaired balance             Nutrition Therapy Plan and Nutrition Goals:  Nutrition Therapy & Goals - 07/25/22 0837       Nutrition Therapy   Diet Heart healthy diet    Drug/Food Interactions Statins/Certain Fruits      Personal Nutrition Goals   Nutrition Goal Patient to improve diet quality by using the plate method as a guide for meal planning to include lean protein/plant protein, fruits, vegetables, whole grains, nonfat dairy as part of a well-balanced diet.    Personal Goal #2 Patient to identify strategies for reducing cardiovascular risk by attending the Pritikin education and  nutrition series weekly.    Personal Goal #3 Patient to limit sodium to 1500mg  per day    Comments Karen [name] recently stopped smoking. She will continue to benefit from participation in intensive cardiac rehab for nutrition, exercise, and lfiestyle modification.      Intervention Plan   Intervention Prescribe, educate and counsel regarding individualized specific dietary modifications aiming towards targeted core components such as weight, hypertension, lipid management, diabetes, heart failure and other comorbidities.;Nutrition handout(s) given to patient.    Expected Outcomes Short Term Goal: Understand basic principles of dietary content, such as calories, fat, sodium, cholesterol and nutrients.;Long Term Goal: Adherence to prescribed nutrition plan.             Nutrition Assessments:  MEDIFICTS Score Key: ?70 Need to make dietary changes  40-70 Heart Healthy Diet ? 40 Therapeutic Level Cholesterol Diet    Picture Your Plate Scores: <16 Unhealthy dietary pattern with much room for improvement. 41-50 Dietary pattern unlikely to meet recommendations for good health and room for improvement. 51-60 More healthful dietary pattern, with some room for improvement.  >60 Healthy dietary pattern, although there may be some specific behaviors that could be improved.    Nutrition Goals Re-Evaluation:  Nutrition Goals Re-Evaluation     Row Name 07/25/22 0837             Goals   Current Weight 154 lb 15.7 oz (70.3 kg)  Comment lipoprotein A 210.6, Lipids WNL -HDL 36, history of iron deficiency and B12 deficiency. B12 is very low normal       Expected Outcome Lanis recently stopped smoking. She will continue to benefit from participation in intensive cardiac rehab for nutrition, exercise, and lfiestyle modification.                Nutrition Goals Re-Evaluation:  Nutrition Goals Re-Evaluation     Row Name 07/25/22 0837             Goals   Current Weight 154 lb 15.7  oz (70.3 kg)       Comment lipoprotein A 210.6, Lipids WNL -HDL 36, history of iron deficiency and B12 deficiency. B12 is very low normal       Expected Outcome Karen [name] recently stopped smoking. She will continue to benefit from participation in intensive cardiac rehab for nutrition, exercise, and lfiestyle modification.                Nutrition Goals Discharge (Final Nutrition Goals Re-Evaluation):  Nutrition Goals Re-Evaluation - 07/25/22 0837       Goals   Current Weight 154 lb 15.7 oz (70.3 kg)    Comment lipoprotein A 210.6, Lipids WNL -HDL 36, history of iron deficiency and B12 deficiency. B12 is very low normal    Expected Outcome Karen [name] recently stopped smoking. She will continue to benefit from participation in intensive cardiac rehab for nutrition, exercise, and lfiestyle modification.             Psychosocial: Target Goals: Acknowledge presence or absence of significant depression and/or stress, maximize coping skills, provide positive support system. Participant is able to verbalize types and ability to use techniques and skills needed for reducing stress and depression.  Initial Review & Psychosocial Screening:  Initial Psych Review & Screening - 07/16/22 1349       Initial Review   Current issues with None Identified      Family Dynamics   Good Support System? Yes    Comments Pt has her 2 sons and her granddaughter for support      Barriers   Psychosocial barriers to participate in program There are no identifiable barriers or psychosocial needs.      Screening Interventions   Interventions Encouraged to exercise             Quality of Life Scores:  Quality of Life - 07/16/22 1352       Quality of Life   Select Quality of Life      Quality of Life Scores   Health/Function Pre 24.32 %    Socioeconomic Pre 29 %    Psych/Spiritual Pre 30 %    Family Pre 28.8 %    GLOBAL Pre 27.14 %            Scores of 19 and below usually indicate a  poorer quality of life in these areas.  A difference of  2-3 points is a clinically meaningful difference.  A difference of 2-3 points in the total score of the Quality of Life Index has been associated with significant improvement in overall quality of life, self-image, physical symptoms, and general health in studies assessing change in quality of life.  PHQ-9: Review Flowsheet       07/16/2022  Depression screen PHQ 2/9  Decreased Interest 0  Down, Depressed, Hopeless 0  PHQ - 2 Score 0  Altered sleeping 0  Tired, decreased energy 0  Change in appetite  0  Feeling bad or failure about yourself  0  Trouble concentrating 0  Moving slowly or fidgety/restless 0  Suicidal thoughts 0  PHQ-9 Score 0  Difficult doing work/chores Not difficult at all   Interpretation of Total Score  Total Score Depression Severity:  1-4 = Minimal depression, 5-9 = Mild depression, 10-14 = Moderate depression, 15-19 = Moderately severe depression, 20-27 = Severe depression   Psychosocial Evaluation and Intervention:   Psychosocial Re-Evaluation:  Psychosocial Re-Evaluation     Row Name 07/24/22 1709 08/08/22 1647           Psychosocial Re-Evaluation   Current issues with None Identified None Identified      Comments -- Karen [name] said her grandaughter that brings her to exercise has cone back to [place] for [employer] duty. Karen [name] is now driving herself      Interventions Encouraged to attend Cardiac Rehabilitation for the exercise Encouraged to attend Cardiac Rehabilitation for the exercise      Continue Psychosocial Services  No Follow up required No Follow up required               Psychosocial Discharge (Final Psychosocial Re-Evaluation):  Psychosocial Re-Evaluation - 08/08/22 1647       Psychosocial Re-Evaluation   Current issues with None Identified    Comments Karen [name] said her grandaughter that brings her to exercise has cone back to [place] for [employer] duty. Karen [name] is now  driving herself    Interventions Encouraged to attend Cardiac Rehabilitation for the exercise    Continue Psychosocial Services  No Follow up required             Vocational Rehabilitation: Provide vocational rehab assistance to qualifying candidates.   Vocational Rehab Evaluation & Intervention:  Vocational Rehab - 07/16/22 1350       Initial Vocational Rehab Evaluation & Intervention   Assessment shows need for Vocational Rehabilitation No   Pt is retired            Education: Education Goals: Education classes will be provided on a weekly basis, covering required topics. Participant will state understanding/return demonstration of topics presented.    Education     Row Name 07/24/22 1300     Education   Cardiac Education Topics Pritikin   Orthoptist   Educator Dietitian   Weekly Topic Tasty Appetizers and Snacks   Instruction Review Code 1- Verbalizes Understanding   Class Start Time 1150   Class Stop Time 1226   Class Time Calculation (min) 36 min    Row Name 07/26/22 1200     Education   Cardiac Education Topics Pritikin   Licensed conveyancer Nutrition   Instruction Review Code 1- Verbalizes Understanding   Class Start Time 1150   Class Stop Time 1226   Class Time Calculation (min) 36 min    Row Name 07/29/22 1300     Education   Cardiac Education Topics Pritikin   Select Workshops     Workshops   Educator Exercise Physiologist   Select Exercise   Exercise Workshop Exercise Basics: Building Your Action Plan   Instruction Review Code 1- Verbalizes Understanding   Class Start Time 1150   Class Stop Time 1235   Class Time Calculation (min) 45 min    Row Name 08/02/22 1300     Education   Cardiac Education Topics Pritikin   Select Core  Videos     Core Videos   Educator Dietitian   Nutrition Calorie Density   Instruction Review Code 1- Verbalizes Understanding    Class Start Time 1145   Class Stop Time 1225   Class Time Calculation (min) 40 min    Row Name 08/05/22 1400     Education   Cardiac Education Topics Pritikin   Select Core Videos     Core Videos   Educator Dietitian   Nutrition Nutrition Action Plan   Instruction Review Code 1- Verbalizes Understanding   Class Start Time 1145   Class Stop Time 1219   Class Time Calculation (min) 34 min    Row Name 08/07/22 1300     Education   Cardiac Education Topics Pritikin   Customer service manager   Weekly Topic Efficiency Cooking - Meals in a Snap   Instruction Review Code 1- Verbalizes Understanding   Class Start Time 1140   Class Stop Time 1220   Class Time Calculation (min) 40 min            Core Videos: Exercise    Move It!  Clinical staff conducted group or individual video education with verbal and written material and guidebook.  Patient learns the recommended Pritikin exercise program. Exercise with the goal of living a long, healthy life. Some of the health benefits of exercise include controlled diabetes, healthier blood pressure levels, improved cholesterol levels, improved heart and lung capacity, improved sleep, and better body composition. Everyone should speak with their doctor before starting or changing an exercise routine.  Biomechanical Limitations Clinical staff conducted group or individual video education with verbal and written material and guidebook.  Patient learns how biomechanical limitations can impact exercise and how we can mitigate and possibly overcome limitations to have an impactful and balanced exercise routine.  Body Composition Clinical staff conducted group or individual video education with verbal and written material and guidebook.  Patient learns that body composition (ratio of muscle mass to fat mass) is a key component to assessing overall fitness, rather than body weight alone. Increased fat  mass, especially visceral belly fat, can put [place] at increased risk for metabolic syndrome, type 2 diabetes, heart disease, and even death. It is recommended to combine diet and exercise (cardiovascular and resistance training) to improve your body composition. Seek guidance from your physician and exercise physiologist before implementing an exercise routine.  Exercise Action Plan Clinical staff conducted group or individual video education with verbal and written material and guidebook.  Patient learns the recommended strategies to achieve and enjoy long-term exercise adherence, including variety, self-motivation, self-efficacy, and positive decision making. Benefits of exercise include fitness, good health, weight management, more energy, better sleep, less stress, and overall well-being.  Medical   Heart Disease Risk Reduction Clinical staff conducted group or individual video education with verbal and written material and guidebook.  Patient learns our heart is our most vital organ as it circulates oxygen, nutrients, white blood cells, and hormones throughout the entire body, and carries waste away. Data supports a plant-based eating plan like the Pritikin Program for its effectiveness in slowing progression of and reversing heart disease. The video provides a number of recommendations to address heart disease.   Metabolic Syndrome and Belly Fat  Clinical staff conducted group or individual video education with verbal and written material and guidebook.  Patient learns what metabolic syndrome is, how it leads to heart disease, and how  one can reverse it and keep it from coming back. You have metabolic syndrome if you have 3 of the following 5 criteria: abdominal obesity, high blood pressure, high triglycerides, low HDL cholesterol, and high blood sugar.  Hypertension and Heart Disease Clinical staff conducted group or individual video education with verbal and written material and guidebook.   Patient learns that high blood pressure, or hypertension, is very common in the [place]. Hypertension is largely due to excessive salt intake, but other important risk factors include being overweight, physical inactivity, drinking too much alcohol, smoking, and not eating enough potassium from fruits and vegetables. High blood pressure is a leading risk factor for heart attack, stroke, congestive heart failure, dementia, kidney failure, and premature death. Long-term effects of excessive salt intake include stiffening of the arteries and thickening of heart muscle and organ damage. Recommendations include ways to reduce hypertension and the risk of heart disease.  Diseases of Our Time - Focusing on Diabetes Clinical staff conducted group or individual video education with verbal and written material and guidebook.  Patient learns why the best way to stop diseases of our time is prevention, through food and other lifestyle changes. Medicine (such as prescription pills and surgeries) is often only a Band-Aid on the problem, not a long-term solution. Most common diseases of our time include obesity, type 2 diabetes, hypertension, heart disease, and cancer. The Pritikin Program is recommended and has been proven to help reduce, reverse, and/or prevent the damaging effects of metabolic syndrome.  Nutrition   Overview of the Pritikin Eating Plan  Clinical staff conducted group or individual video education with verbal and written material and guidebook.  Patient learns about the Pritikin Eating Plan for disease risk reduction. The Pritikin Eating Plan emphasizes a wide variety of unrefined, minimally-processed carbohydrates, like fruits, vegetables, whole grains, and legumes. Go, Caution, and Stop food choices are explained. Plant-based and lean animal proteins are emphasized. Rationale provided for low sodium intake for blood pressure control, low added sugars for blood sugar stabilization, and low  added fats and oils for coronary artery disease risk reduction and weight management.  Calorie Density  Clinical staff conducted group or individual video education with verbal and written material and guidebook.  Patient learns about calorie density and how it impacts the Pritikin Eating Plan. Knowing the characteristics of the food you choose will help you decide whether those foods will lead to weight gain or weight loss, and whether you want to consume more or less of them. Weight loss is usually a side effect of the Pritikin Eating Plan because of its focus on low calorie-dense foods.  Label Reading  Clinical staff conducted group or individual video education with verbal and written material and guidebook.  Patient learns about the Pritikin recommended label reading guidelines and corresponding recommendations regarding calorie density, added sugars, sodium content, and whole grains.  Dining Out - Part 1  Clinical staff conducted group or individual video education with verbal and written material and guidebook.  Patient learns that restaurant meals can be sabotaging because they can be so high in calories, fat, sodium, and/or sugar. Patient learns recommended strategies on how to positively address this and avoid unhealthy pitfalls.  Facts on Fats  Clinical staff conducted group or individual video education with verbal and written material and guidebook.  Patient learns that lifestyle modifications can be just as effective, if not more so, as many medications for lowering your risk of heart disease. A Pritikin lifestyle can help  to reduce your risk of inflammation and atherosclerosis (cholesterol build-up, or plaque, in the artery walls). Lifestyle interventions such as dietary choices and physical activity address the cause of atherosclerosis. A review of the types of fats and their impact on blood cholesterol levels, along with dietary recommendations to reduce fat intake is also  included.  Nutrition Action Plan  Clinical staff conducted group or individual video education with verbal and written material and guidebook.  Patient learns how to incorporate Pritikin recommendations into their lifestyle. Recommendations include planning and keeping personal health goals in mind as an important part of their success.  Healthy Mind-Set    Healthy Minds, Bodies, Hearts  Clinical staff conducted group or individual video education with verbal and written material and guidebook.  Patient learns how to identify when they are stressed. Video will discuss the impact of that stress, as well as the many benefits of stress management. Patient will also be introduced to stress management techniques. The way we think, act, and feel has an impact on our hearts.  How Our Thoughts Can Heal Our Hearts  Clinical staff conducted group or individual video education with verbal and written material and guidebook.  Patient learns that negative thoughts can cause depression and anxiety. This can result in negative lifestyle behavior and serious health problems. Cognitive behavioral therapy is an effective method to help control our thoughts in order to change and improve our emotional outlook.  Additional Videos:  Exercise    Improving Performance  Clinical staff conducted group or individual video education with verbal and written material and guidebook.  Patient learns to use a non-linear approach by alternating intensity levels and lengths of time spent exercising to help burn more calories and lose more body fat. Cardiovascular exercise helps improve heart health, metabolism, hormonal balance, blood sugar control, and recovery from fatigue. Resistance training improves strength, endurance, balance, coordination, reaction time, metabolism, and muscle mass. Flexibility exercise improves circulation, posture, and balance. Seek guidance from your physician and exercise physiologist before  implementing an exercise routine and learn your capabilities and proper form for all exercise.  Introduction to Yoga  Clinical staff conducted group or individual video education with verbal and written material and guidebook.  Patient learns about yoga, a discipline of the coming together of mind, breath, and body. The benefits of yoga include improved flexibility, improved range of motion, better posture and core strength, increased lung function, weight loss, and positive self-image. Yoga's heart health benefits include lowered blood pressure, healthier heart rate, decreased cholesterol and triglyceride levels, improved immune function, and reduced stress. Seek guidance from your physician and exercise physiologist before implementing an exercise routine and learn your capabilities and proper form for all exercise.  Medical   Aging: Enhancing Your Quality of Life  Clinical staff conducted group or individual video education with verbal and written material and guidebook.  Patient learns key strategies and recommendations to stay in good physical health and enhance quality of life, such as prevention strategies, having an advocate, securing a Health Care Proxy and Power of Attorney, and keeping a list of medications and system for tracking them. It also discusses how to avoid risk for bone loss.  Biology of Weight Control  Clinical staff conducted group or individual video education with verbal and written material and guidebook.  Patient learns that weight gain occurs because we consume more calories than we burn (eating more, moving less). Even if your body weight is normal, you may have higher ratios of fat  compared to muscle mass. Too much body fat puts you at increased risk for cardiovascular disease, heart attack, stroke, type 2 diabetes, and obesity-related cancers. In addition to exercise, following the Pritikin Eating Plan can help reduce your risk.  Decoding Lab Results  Clinical staff  conducted group or individual video education with verbal and written material and guidebook.  Patient learns that lab test reflects one measurement whose values change over time and are influenced by many factors, including medication, stress, sleep, exercise, food, hydration, pre-existing medical conditions, and more. It is recommended to use the knowledge from this video to become more involved with your lab results and evaluate your numbers to speak with your doctor.   Diseases of Our Time - Overview  Clinical staff conducted group or individual video education with verbal and written material and guidebook.  Patient learns that according to the CDC, 50% to 70% of chronic diseases (such as obesity, type 2 diabetes, elevated lipids, hypertension, and heart disease) are avoidable through lifestyle improvements including healthier food choices, listening to satiety cues, and increased physical activity.  Sleep Disorders Clinical staff conducted group or individual video education with verbal and written material and guidebook.  Patient learns how good quality and duration of sleep are important to overall health and well-being. Patient also learns about sleep disorders and how they impact health along with recommendations to address them, including discussing with a physician.  Nutrition  Dining Out - Part 2 Clinical staff conducted group or individual video education with verbal and written material and guidebook.  Patient learns how to plan ahead and communicate in order to maximize their dining experience in a healthy and nutritious manner. Included are recommended food choices based on the type of restaurant the patient is visiting.   Fueling a Banker conducted group or individual video education with verbal and written material and guidebook.  There is a strong connection between our food choices and our health. Diseases like obesity and type 2 diabetes are very  prevalent and are in large-part due to lifestyle choices. The Pritikin Eating Plan provides plenty of food and hunger-curbing satisfaction. It is easy to follow, affordable, and helps reduce health risks.  Menu Workshop  Clinical staff conducted group or individual video education with verbal and written material and guidebook.  Patient learns that restaurant meals can sabotage health goals because they are often packed with calories, fat, sodium, and sugar. Recommendations include strategies to plan ahead and to communicate with the manager, chef, or server to help order a healthier meal.  Planning Your Eating Strategy  Clinical staff conducted group or individual video education with verbal and written material and guidebook.  Patient learns about the Pritikin Eating Plan and its benefit of reducing the risk of disease. The Pritikin Eating Plan does not focus on calories. Instead, it emphasizes high-quality, nutrient-rich foods. By knowing the characteristics of the foods, we choose, we can determine their calorie density and make informed decisions.  Targeting Your Nutrition Priorities  Clinical staff conducted group or individual video education with verbal and written material and guidebook.  Patient learns that lifestyle habits have a tremendous impact on disease risk and progression. This video provides eating and physical activity recommendations based on your personal health goals, such as reducing LDL cholesterol, losing weight, preventing or controlling type 2 diabetes, and reducing high blood pressure.  Vitamins and Minerals  Clinical staff conducted group or individual video education with verbal and written material and guidebook.  Patient learns different ways to obtain key vitamins and minerals, including through a recommended healthy diet. It is important to discuss all supplements you take with your doctor.   Healthy Mind-Set    Smoking Cessation  Clinical staff conducted group  or individual video education with verbal and written material and guidebook.  Patient learns that cigarette smoking and tobacco addiction pose a serious health risk which affects millions of people. Stopping smoking will significantly reduce the risk of heart disease, lung disease, and many forms of cancer. Recommended strategies for quitting are covered, including working with your doctor to develop a successful plan.  Culinary   Becoming a Set designer conducted group or individual video education with verbal and written material and guidebook.  Patient learns that cooking at home can be healthy, cost-effective, quick, and puts them in control. Keys to cooking healthy recipes will include looking at your recipe, assessing your equipment needs, planning ahead, making it simple, choosing cost-effective seasonal ingredients, and limiting the use of added fats, salts, and sugars.  Cooking - Breakfast and Snacks  Clinical staff conducted group or individual video education with verbal and written material and guidebook.  Patient learns how important breakfast is to satiety and nutrition through the entire day. Recommendations include key foods to eat during breakfast to help stabilize blood sugar levels and to prevent overeating at meals later in the day. Planning ahead is also a key component.  Cooking - Educational psychologist conducted group or individual video education with verbal and written material and guidebook.  Patient learns eating strategies to improve overall health, including an approach to cook more at home. Recommendations include thinking of animal protein as a side on your plate rather than center stage and focusing instead on lower calorie dense options like vegetables, fruits, whole grains, and plant-based proteins, such as beans. Making sauces in large quantities to freeze for later and leaving the skin on your vegetables are also recommended to maximize  your experience.  Cooking - Healthy Salads and Dressing Clinical staff conducted group or individual video education with verbal and written material and guidebook.  Patient learns that vegetables, fruits, whole grains, and legumes are the foundations of the Pritikin Eating Plan. Recommendations include how to incorporate each of these in flavorful and healthy salads, and how to create homemade salad dressings. Proper handling of ingredients is also covered. Cooking - Soups and [employer] - Soups and Desserts Clinical staff conducted group or individual video education with verbal and written material and guidebook.  Patient learns that Pritikin soups and desserts make for easy, nutritious, and delicious snacks and meal components that are low in sodium, fat, sugar, and calorie density, while high in vitamins, minerals, and filling fiber. Recommendations include simple and healthy ideas for soups and desserts.   Overview     The Pritikin Solution Program Overview Clinical staff conducted group or individual video education with verbal and written material and guidebook.  Patient learns that the results of the Pritikin Program have been documented in more than 100 articles published in peer-reviewed journals, and the benefits include reducing risk factors for (and, in some cases, even reversing) high cholesterol, high blood pressure, type 2 diabetes, obesity, and more! An overview of the three key pillars of the Pritikin Program will be covered: eating well, doing regular exercise, and having a healthy mind-set.  WORKSHOPS  Exercise: Exercise Basics: Building Your Action Plan Clinical staff led group instruction and  group discussion with PowerPoint presentation and patient guidebook. To enhance the learning environment the use of posters, models and videos may be added. At the conclusion of this workshop, patients will comprehend the difference between physical activity and exercise, as well  as the benefits of incorporating both, into their routine. Patients will understand the FITT (Frequency, Intensity, Time, and Type) principle and how to use it to build an exercise action plan. In addition, safety concerns and other considerations for exercise and cardiac rehab will be addressed by the presenter. The purpose of this lesson is to promote a comprehensive and effective weekly exercise routine in order to improve patients' overall level of fitness.   Managing Heart Disease: Your Path to a Healthier Heart Clinical staff led group instruction and group discussion with PowerPoint presentation and patient guidebook. To enhance the learning environment the use of posters, models and videos may be added.At the conclusion of this workshop, patients will understand the anatomy and physiology of the heart. Additionally, they will understand how Pritikin's three pillars impact the risk factors, the progression, and the management of heart disease.  The purpose of this lesson is to provide a high-level overview of the heart, heart disease, and how the Pritikin lifestyle positively impacts risk factors.  Exercise Biomechanics Clinical staff led group instruction and group discussion with PowerPoint presentation and patient guidebook. To enhance the learning environment the use of posters, models and videos may be added. Patients will learn how the structural parts of their bodies function and how these functions impact their daily activities, movement, and exercise. Patients will learn how to promote a neutral spine, learn how to manage pain, and identify ways to improve their physical movement in order to promote healthy living. The purpose of this lesson is to expose patients to common physical limitations that impact physical activity. Participants will learn practical ways to adapt and manage aches and pains, and to minimize their effect on regular exercise. Patients will learn how to  maintain good posture while sitting, walking, and lifting.  Balance Training and Fall Prevention  Clinical staff led group instruction and group discussion with PowerPoint presentation and patient guidebook. To enhance the learning environment the use of posters, models and videos may be added. At the conclusion of this workshop, patients will understand the importance of their sensorimotor skills (vision, proprioception, and the vestibular system) in maintaining their ability to balance as they age. Patients will apply a variety of balancing exercises that are appropriate for their current level of function. Patients will understand the common causes for poor balance, possible solutions to these problems, and ways to modify their physical environment in order to minimize their fall risk. The purpose of this lesson is to teach patients about the importance of maintaining balance as they age and ways to minimize their risk of falling.  WORKSHOPS   Nutrition:  Fueling a Ship broker led group instruction and group discussion with PowerPoint presentation and patient guidebook. To enhance the learning environment the use of posters, models and videos may be added. Patients will review the foundational principles of the Pritikin Eating Plan and understand what constitutes a serving size in each of the food groups. Patients will also learn Pritikin-friendly foods that are better choices when away from home and review make-ahead meal and snack options. Calorie density will be reviewed and applied to three nutrition priorities: weight maintenance, weight loss, and weight gain. The purpose of this lesson is to reinforce (in a group setting)  the key concepts around what patients are recommended to eat and how to apply these guidelines when away from home by planning and selecting Pritikin-friendly options. Patients will understand how calorie density may be adjusted for different weight management  goals.  Mindful Eating  Clinical staff led group instruction and group discussion with PowerPoint presentation and patient guidebook. To enhance the learning environment the use of posters, models and videos may be added. Patients will briefly review the concepts of the Pritikin Eating Plan and the importance of low-calorie dense foods. The concept of mindful eating will be introduced as well as the importance of paying attention to internal hunger signals. Triggers for non-hunger eating and techniques for dealing with triggers will be explored. The purpose of this lesson is to provide patients with the opportunity to review the basic principles of the Pritikin Eating Plan, discuss the value of eating mindfully and how to measure internal cues of hunger and fullness using the Hunger Scale. Patients will also discuss reasons for non-hunger eating and learn strategies to use for controlling emotional eating.  Targeting Your Nutrition Priorities Clinical staff led group instruction and group discussion with PowerPoint presentation and patient guidebook. To enhance the learning environment the use of posters, models and videos may be added. Patients will learn how to determine their genetic susceptibility to disease by reviewing their family history. Patients will gain insight into the importance of diet as part of an overall healthy lifestyle in mitigating the impact of genetics and other environmental insults. The purpose of this lesson is to provide patients with the opportunity to assess their personal nutrition priorities by looking at their family history, their own health history and current risk factors. Patients will also be able to discuss ways of prioritizing and modifying the Pritikin Eating Plan for their highest risk areas  Menu  Clinical staff led group instruction and group discussion with PowerPoint presentation and patient guidebook. To enhance the learning environment the use of posters,  models and videos may be added. Using menus brought in from [employer] restaurants, or printed from [employer] menus, patients will apply the Pritikin dining out guidelines that were presented in the [employer] Dining Out educational video. Patients will also be able to practice these guidelines in a variety of provided scenarios. The purpose of this lesson is to provide patients with the opportunity to practice hands-on learning of the Pritikin Dining Out guidelines with actual menus and practice scenarios.  Label Reading Clinical staff led group instruction and group discussion with PowerPoint presentation and patient guidebook. To enhance the learning environment the use of posters, models and videos may be added. Patients will review and discuss the Pritikin label reading guidelines presented in Pritikin's Label Reading Educational series video. Using fool labels brought in from local grocery stores and markets, patients will apply the label reading guidelines and determine if the packaged food meet the Pritikin guidelines. The purpose of this lesson is to provide patients with the opportunity to review, discuss, and practice hands-on learning of the Pritikin Label Reading guidelines with actual packaged food labels. Cooking School  Pritikin's [employer] Workshops are designed to teach patients ways to prepare quick, simple, and affordable recipes at home. The importance of nutrition's role in chronic disease risk reduction is reflected in its emphasis in the overall Pritikin program. By learning how to prepare essential core Pritikin Eating Plan recipes, patients will increase control over what they eat; be able to customize the flavor of foods without the use of  added salt, sugar, or fat; and improve the quality of the food they consume. By learning a set of core recipes which are easily assembled, quickly prepared, and affordable, patients are more likely to prepare more healthy foods at home. These workshops  focus on convenient breakfasts, simple entres, side dishes, and desserts which can be prepared with minimal effort and are consistent with nutrition recommendations for cardiovascular risk reduction. Cooking [employer] are taught by a Armed forces logistics/support/administrative officer (RD) who has been trained by the [employer]. The chef or RD has a clear understanding of the importance of minimizing - if not completely eliminating - added fat, sugar, and sodium in recipes. Throughout the series of Cooking School Workshop sessions, patients will learn about healthy ingredients and efficient methods of cooking to build confidence in their capability to prepare    Cooking School weekly topics:  Adding Flavor- Sodium-Free  Fast and Healthy Breakfasts  Powerhouse Plant-Based Proteins  Satisfying Salads and Dressings  Simple Sides and Sauces  International Cuisine-Spotlight on the [employer] Zones  Delicious Desserts  Savory Soups  [employer] - Meals in a Astronomer Appetizers and Snacks  Comforting Weekend Breakfasts  One-Pot Wonders   Fast Evening Meals  Landscape architect Your Pritikin Plate  WORKSHOPS   Healthy Mindset (Psychosocial):  Focused Goals, Sustainable Changes Clinical staff led group instruction and group discussion with PowerPoint presentation and patient guidebook. To enhance the learning environment the use of posters, models and videos may be added. Patients will be able to apply effective goal setting strategies to establish at least one personal goal, and then take consistent, meaningful action toward that goal. They will learn to identify common barriers to achieving personal goals and develop strategies to overcome them. Patients will also gain an understanding of how our mind-set can impact our ability to achieve goals and the importance of cultivating a positive and growth-oriented mind-set. The purpose of this lesson is to provide patients with a deeper  understanding of how to set and achieve personal goals, as well as the tools and strategies needed to overcome common obstacles which may arise along the way.  From Head to Heart: The Power of a Healthy Outlook  Clinical staff led group instruction and group discussion with PowerPoint presentation and patient guidebook. To enhance the learning environment the use of posters, models and videos may be added. Patients will be able to recognize and describe the impact of emotions and mood on physical health. They will discover the importance of self-care and explore self-care practices which may work for them. Patients will also learn how to utilize the 4 C's to cultivate a healthier outlook and better manage stress and challenges. The purpose of this lesson is to demonstrate to patients how a healthy outlook is an essential part of maintaining good health, especially as they continue their cardiac rehab journey.  Healthy Sleep for a Healthy Heart Clinical staff led group instruction and group discussion with PowerPoint presentation and patient guidebook. To enhance the learning environment the use of posters, models and videos may be added. At the conclusion of this workshop, patients will be able to demonstrate knowledge of the importance of sleep to overall health, well-being, and quality of life. They will understand the symptoms of, and treatments for, common sleep disorders. Patients will also be able to identify daytime and nighttime behaviors which impact sleep, and they will be able to apply these tools to help manage sleep-related challenges. The  purpose of this lesson is to provide patients with a general overview of sleep and outline the importance of quality sleep. Patients will learn about a few of the most common sleep disorders. Patients will also be introduced to the concept of "sleep hygiene," and discover ways to self-manage certain sleeping problems through simple daily behavior changes.  Finally, the workshop will motivate patients by clarifying the links between quality sleep and their goals of heart-healthy living.   Recognizing and Reducing Stress Clinical staff led group instruction and group discussion with PowerPoint presentation and patient guidebook. To enhance the learning environment the use of posters, models and videos may be added. At the conclusion of this workshop, patients will be able to understand the types of stress reactions, differentiate between acute and chronic stress, and recognize the impact that chronic stress has on their health. They will also be able to apply different coping mechanisms, such as reframing negative self-talk. Patients will have the opportunity to practice a variety of stress management techniques, such as deep abdominal breathing, progressive muscle relaxation, and/or guided imagery.  The purpose of this lesson is to educate patients on the role of stress in their lives and to provide healthy techniques for coping with it.  Learning Barriers/Preferences:  Learning Barriers/Preferences - 07/16/22 1352       Learning Barriers/Preferences   Learning Barriers Sight;Hearing    Learning Preferences Pictoral;Computer/Internet;Video             Education Topics:  Knowledge Questionnaire Score:  Knowledge Questionnaire Score - 07/16/22 1404       Knowledge Questionnaire Score   Pre Score 24/28             Core Components/Risk Factors/Patient Goals at Admission:  Personal Goals and Risk Factors at Admission - 07/16/22 1402       Core Components/Risk Factors/Patient Goals on Admission   Tobacco Cessation --    Number of packs per day --    Intervention --    Expected Outcomes --             Core Components/Risk Factors/Patient Goals Review:   Goals and Risk Factor Review     Row Name 07/24/22 1711 08/08/22 1649           Core Components/Risk Factors/Patient Goals Review   Personal Goals Review Tobacco  Cessation;Hypertension;Lipids Tobacco Cessation;Hypertension;Lipids      Review Karen [name] started intensive cardiac rehab on 07/24/22. Karen [name] did well with exercise for her fitness level as Karen [name] is deconditoned. Vital signs were stable Karen [name] is doing well with exercise at  intensive cardiac rehab on 07/24/22.  Vital signs have been  stable. Karen [name] is enjoying participating in the program and says she feels stronger      Expected Outcomes Karen [name] will continue to participate in intensive cardiac rehab for exercise nutrition and lifestyle modifications Karen [name] will continue to participate in intensive cardiac rehab for exercise nutrition and lifestyle modifications               Core Components/Risk Factors/Patient Goals at Discharge (Final Review):   Goals and Risk Factor Review - 08/08/22 1649       Core Components/Risk Factors/Patient Goals Review   Personal Goals Review Tobacco Cessation;Hypertension;Lipids    Review Karen [name] is doing well with exercise at  intensive cardiac rehab on 07/24/22.  Vital signs have been  stable. Karen [name] is enjoying participating in the program and says she feels stronger    Expected Outcomes Karen [name] will continue to participate in  intensive cardiac rehab for exercise nutrition and lifestyle modifications             ITP Comments:  ITP Comments     Row Name 07/16/22 1110 07/24/22 1659 08/08/22 1646       ITP Comments Armanda Magic, MD - Medical Director.  Introduction to the [employer] / Intensive Cardiac Rehab. Initial orientation packet reviewed with the patient. 30 Day ITP Review. Karen [name] started intensive cardiac rehab on 07/24/22 and did well for her fitness level 30 Day ITP Review. Karen [name] has good attendance and participation in intensive cardiac rehab              Comments: See ITP comments.

## 2022-08-09 ENCOUNTER — Encounter (HOSPITAL_COMMUNITY)
Admission: RE | Admit: 2022-08-09 | Discharge: 2022-08-09 | Disposition: A | Discharge status: Home / Self Care | Payer: 59 | Source: Ambulatory Visit | Attending: Interventional Cardiology | Admitting: Interventional Cardiology

## 2022-08-09 DIAGNOSIS — Z955 Presence of coronary angioplasty implant and graft: Secondary | ICD-10-CM

## 2022-08-09 DIAGNOSIS — I2111 ST elevation (STEMI) myocardial infarction involving right coronary artery: Secondary | ICD-10-CM

## 2022-08-09 DIAGNOSIS — Z9861 Coronary angioplasty status: Secondary | ICD-10-CM

## 2022-08-12 ENCOUNTER — Encounter (HOSPITAL_COMMUNITY): Payer: 59

## 2022-08-14 ENCOUNTER — Ambulatory Visit: Payer: 59 | Admitting: Emergency Medicine

## 2022-08-14 ENCOUNTER — Encounter (HOSPITAL_COMMUNITY): Payer: 59

## 2022-08-16 ENCOUNTER — Encounter (HOSPITAL_COMMUNITY)
Admission: RE | Admit: 2022-08-16 | Discharge: 2022-08-16 | Disposition: A | Discharge status: Home / Self Care | Payer: 59 | Source: Ambulatory Visit | Attending: Interventional Cardiology | Admitting: Interventional Cardiology

## 2022-08-16 DIAGNOSIS — Z9861 Coronary angioplasty status: Secondary | ICD-10-CM

## 2022-08-16 DIAGNOSIS — Z955 Presence of coronary angioplasty implant and graft: Secondary | ICD-10-CM | POA: Diagnosis not present

## 2022-08-16 DIAGNOSIS — I2111 ST elevation (STEMI) myocardial infarction involving right coronary artery: Secondary | ICD-10-CM

## 2022-08-19 ENCOUNTER — Encounter (HOSPITAL_COMMUNITY)
Admission: RE | Admit: 2022-08-19 | Discharge: 2022-08-19 | Disposition: A | Discharge status: Home / Self Care | Payer: 59 | Source: Ambulatory Visit | Attending: Interventional Cardiology | Admitting: Interventional Cardiology

## 2022-08-19 DIAGNOSIS — I2111 ST elevation (STEMI) myocardial infarction involving right coronary artery: Secondary | ICD-10-CM

## 2022-08-19 DIAGNOSIS — Z9861 Coronary angioplasty status: Secondary | ICD-10-CM

## 2022-08-19 DIAGNOSIS — Z955 Presence of coronary angioplasty implant and graft: Secondary | ICD-10-CM | POA: Diagnosis not present

## 2022-08-21 ENCOUNTER — Encounter (HOSPITAL_COMMUNITY)
Admission: RE | Admit: 2022-08-21 | Discharge: 2022-08-21 | Disposition: A | Discharge status: Home / Self Care | Payer: 59 | Source: Ambulatory Visit | Attending: Interventional Cardiology | Admitting: Interventional Cardiology

## 2022-08-21 DIAGNOSIS — Z955 Presence of coronary angioplasty implant and graft: Secondary | ICD-10-CM | POA: Diagnosis not present

## 2022-08-21 DIAGNOSIS — I2111 ST elevation (STEMI) myocardial infarction involving right coronary artery: Secondary | ICD-10-CM

## 2022-08-21 DIAGNOSIS — Z9861 Coronary angioplasty status: Secondary | ICD-10-CM

## 2022-08-22 ENCOUNTER — Ambulatory Visit (INDEPENDENT_AMBULATORY_CARE_PROVIDER_SITE_OTHER): Payer: 59 | Admitting: Emergency Medicine

## 2022-08-22 ENCOUNTER — Encounter: Payer: Self-pay | Admitting: Emergency Medicine

## 2022-08-22 VITALS — BP 124/76 | HR 69 | Temp 98.6°F | Ht 63.0 in | Wt 152.0 lb

## 2022-08-22 DIAGNOSIS — J449 Chronic obstructive pulmonary disease, unspecified: Secondary | ICD-10-CM

## 2022-08-22 DIAGNOSIS — C3490 Malignant neoplasm of unspecified part of unspecified bronchus or lung: Secondary | ICD-10-CM | POA: Diagnosis not present

## 2022-08-22 NOTE — Progress Notes (Signed)
Subjective:    Patient ID: Karen [name], female    DOB: [birth year], 77 y.o.   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  She has persistent nodular disease that we have been following with serial imaging.  I seen her for her COPD which has been managed with Spiriva.  She has a history of chronic cough in the setting of GERD.  I changed her lisinopril to valsartan at our last visit in May - she did not change due to cost.  She reports that she continues to cough daily. She has exertional SOB with walking, housework. She uses albuterol about 3x a week. She is producing phlegm, white, no blood.   CT scan of the chest 02/04/2022 reviewed by me showed stable bilateral pulmonary nodules, 2.2 x 2.0 cm in the left lower lobe, 1.9 cm groundglass opacity posterior right lower lobe, 10 mm right lower lobe nodule.  PET scan performed 02/22/2022 reviewed by me showed no significant adenopathy, mild increased FDG uptake in the treated left lower lobe superior segmental nodule, no significant hypermetabolism in the other pulmonary nodules.  ROV 08/22/2022 --Karen [name] is 85 with a history of COPD and left lower lobe adenocarcinoma treated with SBRT (12/2020).  She has persistent nodular disease that we have been following with serial imaging.  PET scan 01/2022 showed some mild hypermetabolism in the treated left lower lobe superior segmental nodule, no hypermetabolism in other nodules.  Her most recent scan was done 07/01/2022 as below.  She has been on Stiolto since last visit (from Spiriva) - she is unsure which one she has, does not use every day.  She has chronic cough with GERD.  Today she reports that she is doing cardiac rehab, feels that she is benefiting from this. Does have SOB with the activity. She coughs at night. I had discussed changing ACE-I to ARB, but she doesn;t know  which one she is on.   CT chest 07/01/2022 reviewed by me shows atelectatic change along the medial right lung base that is unchanged.  Slight increase in size in an area of posterior right lower lobe groundglass now 2.3 cm.  Left lung with a tiny effusion, more confluent superior segmental left lower lobe opacity now 3.9 cm  Review of Systems As per HPI  Past Medical History:  Diagnosis Date   Acute myocardial infarction, unspecified site, episode of care unspecified    Adenocarcinoma, lung 02/16/2021   Allergic rhinitis, cause unspecified    CAD (coronary artery disease)    GERD (gastroesophageal reflux disease)    Headache(784.0)    History of radiation therapy    left lung SBRT 02/08/2021, 02/13/2021, 02/15/2021  Dr Antony Blackbird   HTN (hypertension)    Other and unspecified hyperlipidemia    Other diseases of lung, not elsewhere classified    Postsurgical aortocoronary bypass status    hx of it.      Family History  Problem Relation Age of Onset   Heart attack Mother    Heart disease Mother    Breast cancer Maternal Aunt      Social History   Socioeconomic History   Marital status: Legally Separated    Spouse name: Not on file   Number of children: Not on file   Years of education: 11   Highest education level: 11th grade  Occupational History   Not  on file  Tobacco Use   Smoking status: Former    Packs/day: 0.50    Years: 54.00    Additional pack years: 0.00    Total pack years: 27.00    Types: Cigarettes    Start date: 29    Quit date: 06/05/2022    Years since quitting: 0.2   Smokeless tobacco: Never   Tobacco comments:    Smokes 1 cigarette every once in a while ARJ 08/22/22  Vaping Use   Vaping Use: Never used  Substance and Sexual Activity   Alcohol use: No   Drug use: No   Sexual activity: Not Currently  Other Topics Concern   Not on file  Social History Narrative   Lives with 2 sons and granddaughter   Social Determinants of Health    Financial Resource Strain: Not on file  Food Insecurity: No Food Insecurity (05/06/2022)   Hunger Vital Sign    Worried About Running Out of Food in the Last Year: Never true    Ran Out of Food in the Last Year: Never true  Transportation Needs: No Transportation Needs (05/06/2022)   PRAPARE - Administrator, Civil Service (Medical): No    Lack of Transportation (Non-Medical): No  Physical Activity: Not on file  Stress: Not on file  Social Connections: Not on file  Intimate Partner Violence: Not At Risk (05/06/2022)   Humiliation, Afraid, Rape, and Kick questionnaire    Fear of Current or Ex-Partner: No    Emotionally Abused: No    Physically Abused: No    Sexually Abused: No    No hx TB exposure [place] L[place] in [place]   Allergies  Allergen Reactions   Codeine Itching and Swelling   Novocain [Procaine] Hives and Swelling    Eyes swelling     Outpatient Medications Prior to Visit  Medication Sig Dispense Refill   acetaminophen (TYLENOL) 500 MG tablet Take 500 mg by mouth every 6 (six) hours as needed for headache.     albuterol (VENTOLIN HFA) 108 (90 Base) MCG/ACT inhaler Inhale 2 puffs into the lungs every 6 (six) hours as needed for wheezing or shortness of breath. 54 g 3   clopidogrel (PLAVIX) 75 MG tablet Take 1 tablet (75 mg total) by mouth daily. 90 tablet 3   cyanocobalamin (VITAMIN B12) 1000 MCG tablet Take 1 tablet (1,000 mcg total) by mouth daily. 30 tablet 0   cyclobenzaprine (FLEXERIL) 10 MG tablet Take 10 mg by mouth 3 (three) times daily as needed for muscle spasms.     fluticasone (FLONASE) 50 MCG/ACT nasal spray Place 1 spray into both nostrils daily. (Patient taking differently: Place 1 spray into both nostrils daily as needed for allergies.) 18.2 mL 2   furosemide (LASIX) 40 MG tablet Take 1 tablet (40 mg total) by mouth daily. 90 tablet 3   isosorbide mononitrate (IMDUR) 60 MG 24 hr tablet Take 1 tablet (60 mg total) by  mouth 2 (two) times daily. 180 tablet 3   lisinopril (ZESTRIL) 10 MG tablet Take 1 tablet (10 mg total) by mouth daily. 90 tablet 3   metoprolol tartrate (LOPRESSOR) 25 MG tablet Take 0.5 tablets (12.5 mg total) by mouth 2 (two) times daily. 90 tablet 3   naloxone (NARCAN) nasal spray 4 mg/0.1 mL SMARTSIG:Both Nares     nitroGLYCERIN (NITROSTAT) 0.4 MG SL tablet Place 1 tablet (0.4 mg total) under the tongue every 5 (five) minutes  as needed for chest pain (X3 DOSES MAX). 25 tablet 11   polyethylene glycol (MIRALAX / GLYCOLAX) 17 g packet Take 17 g by mouth daily. 14 each 0   ranolazine (RANEXA) 500 MG 12 hr tablet Take 1 tablet (500 mg total) by mouth 2 (two) times daily. 180 tablet 3   rosuvastatin (CRESTOR) 20 MG tablet Take 1 tablet (20 mg total) by mouth daily. 90 tablet 3   amLODipine (NORVASC) 5 MG tablet Take 1 tablet (5 mg total) by mouth daily. (Patient not taking: Reported on 08/22/2022) 90 tablet 3   oxyCODONE-acetaminophen (PERCOCET) 7.5-325 MG tablet Take 1-2 tablets by mouth every 4 (four) hours as needed for moderate pain (back pain). (Patient not taking: Reported on 08/22/2022)     pantoprazole (PROTONIX) 40 MG tablet Take 1 tablet (40 mg total) by mouth daily. 30 tablet 0   No facility-administered medications prior to visit.          Objective:   Physical Exam  Vitals:   08/22/22 0956  BP: 124/76  Pulse: 69  Temp: 98.6 F (37 C)  TempSrc: Oral  SpO2: 99%  Weight: 152 lb (68.9 kg)  Height: 5\' 3"  (1.6 m)   Gen: Pleasant, well-nourished, in no distress,  normal affect  ENT: No lesions,  mouth clear, dentures, oropharynx clear, no postnasal drip  Neck: No JVD, no stridor  Lungs: No use of accessory muscles, no crackles or wheezing on normal respiration, no wheeze  Cardiovascular: RRR, heart sounds normal, no murmur or gallops, no peripheral edema  Musculoskeletal: No deformities, no cyanosis or clubbing  Neuro: alert, awake, non focal  Skin: Warm, no lesions  or rash     Assessment & Plan:  Adenocarcinoma, lung (HCC) Looks like there may have been some interval increase in size in both her treated left lower lobe mixed density lesion and her right lower lobe lesion.  Suspect that the right lower lobe lesion represents another adenocarcinoma.  For now we are following with serial imaging, next scan in about 6 months.  She will follow-up with Dr. Arbutus Ped and with me.  If she needs diagnostics then we can arrange bronchoscopy.  Stage 2 moderate COPD by GOLD classification (HCC) She is not taking a maintenance bronchodilator reliably.  At her last visit I tried changing Spiriva Respimat over to [employer].  She is not sure what she has at home, does not take it on a schedule.  She does use albuterol about once daily, has been using in conjunction with her cardiac rehab.  For now I will hold off on restarting schedule therapy.  She needs to bring her inhalers with her next time so we know what she has and then we can decide whether to restart.  Levy Pupa, MD, PhD 08/22/2022, 10:29 AM [facility] [phone] or if no answer before 7:00PM call [phone] For any issues after 7:00PM please call eLink [phone]

## 2022-08-22 NOTE — Assessment & Plan Note (Signed)
She is not taking a maintenance bronchodilator reliably.  At her last visit I tried changing Spiriva Respimat over to [employer].  She is not sure what she has at home, does not take it on a schedule.  She does use albuterol about once daily, has been using in conjunction with her cardiac rehab.  For now I will hold off on restarting schedule therapy.  She needs to bring her inhalers with her next time so we know what she has and then we can decide whether to restart.

## 2022-08-22 NOTE — Patient Instructions (Addendum)
Please keep your albuterol available to use 2 puffs when needed for shortness of breath, chest tightness, wheezing. We will reconsider starting you back on every day scheduled inhaler medication at your next visit depending on how your breathing is doing Continue cardiac rehab as you have been doing Repeat CT scan of the chest in August as per Dr. Asa Lente plans. Follow with Dr. Delton Coombes in September 2024, sooner if you have any problems.

## 2022-08-22 NOTE — Assessment & Plan Note (Signed)
Looks like there may have been some interval increase in size in both her treated left lower lobe mixed density lesion and her right lower lobe lesion.  Suspect that the right lower lobe lesion represents another adenocarcinoma.  For now we are following with serial imaging, next scan in about 6 months.  She will follow-up with Dr. Arbutus Ped and with me.  If she needs diagnostics then we can arrange bronchoscopy.

## 2022-08-23 ENCOUNTER — Encounter (HOSPITAL_COMMUNITY)
Admission: RE | Admit: 2022-08-23 | Discharge: 2022-08-23 | Disposition: A | Discharge status: Home / Self Care | Payer: 59 | Source: Ambulatory Visit | Attending: Interventional Cardiology | Admitting: Interventional Cardiology

## 2022-08-23 DIAGNOSIS — Z955 Presence of coronary angioplasty implant and graft: Secondary | ICD-10-CM | POA: Diagnosis not present

## 2022-08-23 DIAGNOSIS — I2111 ST elevation (STEMI) myocardial infarction involving right coronary artery: Secondary | ICD-10-CM

## 2022-08-23 DIAGNOSIS — Z9861 Coronary angioplasty status: Secondary | ICD-10-CM

## 2022-08-26 ENCOUNTER — Ambulatory Visit (INDEPENDENT_AMBULATORY_CARE_PROVIDER_SITE_OTHER): Payer: 59

## 2022-08-26 ENCOUNTER — Encounter (HOSPITAL_COMMUNITY)
Admission: RE | Admit: 2022-08-26 | Discharge: 2022-08-26 | Disposition: A | Discharge status: Home / Self Care | Payer: 59 | Source: Ambulatory Visit | Attending: Interventional Cardiology | Admitting: Interventional Cardiology

## 2022-08-26 DIAGNOSIS — I4901 Ventricular fibrillation: Secondary | ICD-10-CM | POA: Diagnosis not present

## 2022-08-26 DIAGNOSIS — Z955 Presence of coronary angioplasty implant and graft: Secondary | ICD-10-CM

## 2022-08-26 DIAGNOSIS — I2111 ST elevation (STEMI) myocardial infarction involving right coronary artery: Secondary | ICD-10-CM

## 2022-08-26 DIAGNOSIS — Z9861 Coronary angioplasty status: Secondary | ICD-10-CM

## 2022-08-27 LAB — CUP PACEART REMOTE DEVICE CHECK
Battery Remaining Longevity: 54 mo
Battery Remaining Percentage: 55 %
Brady Statistic RV Percent Paced: 0 %
Date Time Interrogation Session: 20240429044100
HighPow Impedance: 68 Ohm
Implantable Lead Connection Status: 753985
Implantable Lead Implant Date: 20071205
Implantable Lead Location: 753860
Implantable Lead Model: 137
Implantable Lead Serial Number: 102363
Implantable Pulse Generator Implant Date: 20141105
Lead Channel Impedance Value: 472 Ohm
Lead Channel Pacing Threshold Amplitude: 0.8 V
Lead Channel Pacing Threshold Pulse Width: 0.5 ms
Lead Channel Setting Pacing Amplitude: 2 V
Lead Channel Setting Pacing Pulse Width: 0.5 ms
Lead Channel Setting Sensing Sensitivity: 0.6 mV
Pulse Gen Serial Number: 126146

## 2022-08-28 ENCOUNTER — Encounter (HOSPITAL_COMMUNITY)
Admission: RE | Admit: 2022-08-28 | Discharge: 2022-08-28 | Disposition: A | Discharge status: Home / Self Care | Payer: 59 | Source: Ambulatory Visit | Attending: Interventional Cardiology | Admitting: Interventional Cardiology

## 2022-08-28 DIAGNOSIS — Z955 Presence of coronary angioplasty implant and graft: Secondary | ICD-10-CM | POA: Diagnosis present

## 2022-08-28 DIAGNOSIS — Z9861 Coronary angioplasty status: Secondary | ICD-10-CM | POA: Insufficient documentation

## 2022-08-28 DIAGNOSIS — I2111 ST elevation (STEMI) myocardial infarction involving right coronary artery: Secondary | ICD-10-CM | POA: Diagnosis present

## 2022-08-30 ENCOUNTER — Encounter (HOSPITAL_COMMUNITY)
Admission: RE | Admit: 2022-08-30 | Discharge: 2022-08-30 | Disposition: A | Discharge status: Home / Self Care | Payer: 59 | Source: Ambulatory Visit | Attending: Interventional Cardiology | Admitting: Interventional Cardiology

## 2022-08-30 DIAGNOSIS — Z9861 Coronary angioplasty status: Secondary | ICD-10-CM

## 2022-08-30 DIAGNOSIS — Z955 Presence of coronary angioplasty implant and graft: Secondary | ICD-10-CM

## 2022-08-30 DIAGNOSIS — I2111 ST elevation (STEMI) myocardial infarction involving right coronary artery: Secondary | ICD-10-CM | POA: Diagnosis not present

## 2022-09-02 ENCOUNTER — Encounter (HOSPITAL_COMMUNITY)
Admission: RE | Admit: 2022-09-02 | Discharge: 2022-09-02 | Disposition: A | Discharge status: Home / Self Care | Payer: 59 | Source: Ambulatory Visit | Attending: Interventional Cardiology | Admitting: Interventional Cardiology

## 2022-09-02 DIAGNOSIS — I2111 ST elevation (STEMI) myocardial infarction involving right coronary artery: Secondary | ICD-10-CM | POA: Diagnosis not present

## 2022-09-02 DIAGNOSIS — Z9861 Coronary angioplasty status: Secondary | ICD-10-CM

## 2022-09-02 DIAGNOSIS — Z955 Presence of coronary angioplasty implant and graft: Secondary | ICD-10-CM

## 2022-09-03 NOTE — Progress Notes (Signed)
Cardiac Individual Treatment Plan  Patient Details  Name: Karen [name] MRN: [id number] Date of Birth: 16-May-1945 Referring Provider:   Flowsheet Row INTENSIVE CARDIAC REHAB ORIENT from 07/16/2022 in [facility]  Referring Provider Lance Muss, MD       Initial Encounter Date:  Flowsheet Row INTENSIVE CARDIAC REHAB ORIENT from 07/16/2022 in [facility]  Date 07/16/22       Visit Diagnosis: 05/08/22 ST elevation myocardial infarction  05/08/22 S/P PTCA (percutaneous transluminal coronary angioplasty)  10/13 /23 S/P coronary artery stent placement x 3 RCA Graft  Patient's Home Medications on Admission:  Current Outpatient Medications:    acetaminophen (TYLENOL) 500 MG tablet, Take 500 mg by mouth every 6 (six) hours as needed for headache., Disp: , Rfl:    albuterol (VENTOLIN HFA) 108 (90 Base) MCG/ACT inhaler, Inhale 2 puffs into the lungs every 6 (six) hours as needed for wheezing or shortness of breath., Disp: 54 g, Rfl: 3   amLODipine (NORVASC) 5 MG tablet, Take 1 tablet (5 mg total) by mouth daily. (Patient not taking: Reported on 08/22/2022), Disp: 90 tablet, Rfl: 3   clopidogrel (PLAVIX) 75 MG tablet, Take 1 tablet (75 mg total) by mouth daily., Disp: 90 tablet, Rfl: 3   cyanocobalamin (VITAMIN B12) 1000 MCG tablet, Take 1 tablet (1,000 mcg total) by mouth daily., Disp: 30 tablet, Rfl: 0   cyclobenzaprine (FLEXERIL) 10 MG tablet, Take 10 mg by mouth 3 (three) times daily as needed for muscle spasms., Disp: , Rfl:    fluticasone (FLONASE) 50 MCG/ACT nasal spray, Place 1 spray into both nostrils daily. (Patient taking differently: Place 1 spray into both nostrils daily as needed for allergies.), Disp: 18.2 mL, Rfl: 2   furosemide (LASIX) 40 MG tablet, Take 1 tablet (40 mg total) by mouth daily., Disp: 90 tablet, Rfl: 3   isosorbide mononitrate (IMDUR) 60 MG 24  hr tablet, Take 1 tablet (60 mg total) by mouth 2 (two) times daily., Disp: 180 tablet, Rfl: 3   lisinopril (ZESTRIL) 10 MG tablet, Take 1 tablet (10 mg total) by mouth daily., Disp: 90 tablet, Rfl: 3   metoprolol tartrate (LOPRESSOR) 25 MG tablet, Take 0.5 tablets (12.5 mg total) by mouth 2 (two) times daily., Disp: 90 tablet, Rfl: 3   naloxone (NARCAN) nasal spray 4 mg/0.1 mL, SMARTSIG:Both Nares, Disp: , Rfl:    nitroGLYCERIN (NITROSTAT) 0.4 MG SL tablet, Place 1 tablet (0.4 mg total) under the tongue every 5 (five) minutes as needed for chest pain (X3 DOSES MAX)., Disp: 25 tablet, Rfl: 11   oxyCODONE-acetaminophen (PERCOCET) 7.5-325 MG tablet, Take 1-2 tablets by mouth every 4 (four) hours as needed for moderate pain (back pain). (Patient not taking: Reported on 08/22/2022), Disp: , Rfl:    pantoprazole (PROTONIX) 40 MG tablet, Take 1 tablet (40 mg total) by mouth daily., Disp: 30 tablet, Rfl: 0   polyethylene glycol (MIRALAX / GLYCOLAX) 17 g packet, Take 17 g by mouth daily., Disp: 14 each, Rfl: 0   ranolazine (RANEXA) 500 MG 12 hr tablet, Take 1 tablet (500 mg total) by mouth 2 (two) times daily., Disp: 180 tablet, Rfl: 3   rosuvastatin (CRESTOR) 20 MG tablet, Take 1 tablet (20 mg total) by mouth daily., Disp: 90 tablet, Rfl: 3  Past Medical History: Past Medical History:  Diagnosis Date   Acute myocardial infarction, unspecified site, episode of care unspecified  Adenocarcinoma, 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     Tobacco Use: Social History   Tobacco Use  Smoking Status Former    Packs/day: 0.50   Years: 54.00   Additional pack years: 0.00   Total pack years: 27.00   Types: Cigarettes   Start date: 1968   Quit date: 06/05/2022   Years since quitting: 0.2  Smokeless Tobacco Never  Tobacco Comments   Smokes 1 cigarette every once in a while [facility] 08/22/22    Labs: Review Flowsheet  More data exists      Latest Ref Rng & Units 02/05/2018 08/16/2019 02/16/2021 05/10/2022 06/06/2022  Labs for ITP Cardiac and Pulmonary Rehab  Cholestrol 100 - 199 mg/dL 409  811  914  91  782   LDL (calc) 0 - 99 mg/dL 71  80  84  45  61   HDL-C >39 mg/dL 74  74  82  36  71   Trlycerides 0 - 149 mg/dL 74  54  73  48  41   Hemoglobin A1c 4.8 - 5.6 % - - 6.0  - -    Capillary Blood Glucose: Lab Results  Component Value Date   GLUCAP 114 (H) 02/22/2022   GLUCAP 116 (H) 12/09/2021   GLUCAP 92 12/08/2020     Exercise Target Goals: Exercise Program Goal: Individual exercise prescription set using results from initial 6 min walk test and THRR while considering  patient's activity barriers and safety.   Exercise Prescription Goal: Initial exercise prescription builds to 30-45 minutes a day of aerobic activity, 2-3 days per week.  Home exercise guidelines will be given to patient during program as part of exercise prescription that the participant will acknowledge.  Activity Barriers & Risk Stratification:  Activity Barriers & Cardiac Risk Stratification - 07/16/22 1355       Activity Barriers & Cardiac Risk Stratification   Activity Barriers Arthritis;Back Problems;Joint Problems;Deconditioning;Muscular Weakness;Chest Pain/Angina;Balance Concerns;Assistive Device    Cardiac Risk Stratification High             6 Minute Walk:  6 Minute Walk     Row Name 07/16/22 1354         6 Minute Walk   Phase --  NUSTEP used     Distance 1049 feet  NUSTEP test     Walk Time 6 minutes     # of Rest Breaks 0     MPH 2     METS 1.8     RPE 11     Perceived Dyspnea  0     VO2  Peak 6.3     Symptoms No     Resting HR 74 bpm     Resting BP 116/70     Resting Oxygen Saturation  100 %     Exercise Oxygen Saturation  during 6 min walk 100 %     Max Ex. HR 84 bpm     Max Ex. BP 114/66     2 Minute Post BP 110/60  Oxygen Initial Assessment:   Oxygen Re-Evaluation:   Oxygen Discharge (Final Oxygen Re-Evaluation):   Initial Exercise Prescription:  Initial Exercise Prescription - 07/16/22 1300       Date of Initial Exercise RX and Referring Provider   Date 07/16/22    Referring Provider Lance Muss, MD    Expected Discharge Date 09/27/22      NuStep   Level 1    SPM 65    Minutes 25    METs 1.8      Prescription Details   Frequency (times per week) 3    Duration Progress to 30 minutes of continuous aerobic without signs/symptoms of physical distress      Intensity   THRR 40-80% of Max Heartrate 58-115    Ratings of Perceived Exertion 11-13    Perceived Dyspnea 0-4      Progression   Progression Continue progressive overload as per policy without signs/symptoms or physical distress.      Resistance Training   Training Prescription Yes    Weight 2 lbs    Reps 10-15             Perform Capillary Blood Glucose checks as needed.  Exercise Prescription Changes:   Exercise Prescription Changes     Row Name 07/24/22 1400 08/27/22 1400           Response to Exercise   Blood Pressure (Admit) 108/72 128/70      Blood Pressure (Exercise) 122/60 136/78      Blood Pressure (Exit) 112/54 120/72      Heart Rate (Admit) 65 bpm 69 bpm      Heart Rate (Exercise) 80 bpm 91 bpm      Heart Rate (Exit) 74 bpm 68 bpm      Rating of Perceived Exertion (Exercise) 11 11      Symptoms --  pt reported 5/10 L shoulder pain while exs None      Comments --  pt first day Reviewed MET sand goals      Duration Progress to 30 minutes of  aerobic without signs/symptoms of physical distress Continue with 30 min of aerobic exercise  without signs/symptoms of physical distress.      Intensity THRR unchanged THRR unchanged        Progression   Progression Continue to progress workloads to maintain intensity without signs/symptoms of physical distress. Continue to progress workloads to maintain intensity without signs/symptoms of physical distress.      Average METs 1.8 1.9        Resistance Training   Training Prescription --  no weights on Wed Yes      Weight -- 2 lbs      Reps -- 10-15      Time -- 10 Minutes        Interval Training   Interval Training -- No        NuStep   Level 1 1      SPM 63 68      Minutes 27 28      METs 1.8 1.9               Exercise Comments:   Exercise Comments     Row Name 07/24/22 1437 08/26/22 1504         Exercise Comments Pt had a great first day. Pt completed her exs session despite experiencing 5/10 L shoulder pain. Will continue to monitor and increase her WL as tolerated. Reviewed METs and goals. Progress remains slow.  Exercise Goals and Review:   Exercise Goals     Row Name 07/16/22 1356             Exercise Goals   Increase Physical Activity Yes       Intervention Provide advice, education, support and counseling about physical activity/exercise needs.;Develop an individualized exercise prescription for aerobic and resistive training based on initial evaluation findings, risk stratification, comorbidities and participant's personal goals.       Expected Outcomes Long Term: Exercising regularly at least 3-5 days a week.;Long Term: Add in home exercise to make exercise part of routine and to increase amount of physical activity.;Short Term: Attend rehab on a regular basis to increase amount of physical activity.       Increase Strength and Stamina Yes       Intervention Provide advice, education, support and counseling about physical activity/exercise needs.;Develop an individualized exercise prescription for aerobic and resistive  training based on initial evaluation findings, risk stratification, comorbidities and participant's personal goals.       Expected Outcomes Long Term: Improve cardiorespiratory fitness, muscular endurance and strength as measured by increased METs and functional capacity ( );Short Term: Perform resistance training exercises routinely during rehab and add in resistance training at home;Short Term: Increase workloads from initial exercise prescription for resistance, speed, and METs.       Able to understand and use rate of perceived exertion (RPE) scale Yes       Intervention Provide education and explanation on how to use RPE scale       Expected Outcomes Short Term: Able to use RPE daily in rehab to express subjective intensity level;Long Term:  Able to use RPE to guide intensity level when exercising independently       Knowledge and understanding of Target Heart Rate Range (THRR) Yes       Intervention Provide education and explanation of THRR including how the numbers were predicted and where they are located for reference       Expected Outcomes Short Term: Able to state/look up THRR;Short Term: Able to use daily as guideline for intensity in rehab;Long Term: Able to use THRR to govern intensity when exercising independently       Understanding of Exercise Prescription Yes       Intervention Provide education, explanation, and written materials on patient's individual exercise prescription       Expected Outcomes Short Term: Able to explain program exercise prescription;Long Term: Able to explain home exercise prescription to exercise independently                Exercise Goals Re-Evaluation :  Exercise Goals Re-Evaluation     Row Name 07/24/22 1432 08/27/22 1501           Exercise Goal Re-Evaluation   Exercise Goals Review Increase Physical Activity;Increase Strength and Stamina;Able to understand and use rate of perceived exertion (RPE) scale;Knowledge and understanding of Target  Heart Rate Range (THRR);Understanding of Exercise Prescription Increase Physical Activity;Increase Strength and Stamina;Able to understand and use rate of perceived exertion (RPE) scale;Knowledge and understanding of Target Heart Rate Range (THRR);Understanding of Exercise Prescription      Comments Pt first day in program she reported 5/10 L shoulder pain on the NuStep, While exercising pt avg 1.8 Mets, will continue to educate pt on her ExRx, THRR, and RPE scale while increase her WL as tolerated Reviewed METs and goals. Pt had goals of improved mobility and leg strength. Pt voices she has not seen the  progress she had hoped for. Will coninu to encourage increaseing time to 30 mintues for endurance. Then will focus on increasing workload if the patient can tolerate it.      Expected Outcomes Pt is motivated to reach her goals, will continue to educate and increase WL as tolerated. Will continue to monitor patient and progress workloads as tolerated.               Discharge Exercise Prescription (Final Exercise Prescription Changes):  Exercise Prescription Changes - 08/27/22 1400       Response to Exercise   Blood Pressure (Admit) 128/70    Blood Pressure (Exercise) 136/78    Blood Pressure (Exit) 120/72    Heart Rate (Admit) 69 bpm    Heart Rate (Exercise) 91 bpm    Heart Rate (Exit) 68 bpm    Rating of Perceived Exertion (Exercise) 11    Symptoms None    Comments Reviewed MET sand goals    Duration Continue with 30 min of aerobic exercise without signs/symptoms of physical distress.    Intensity THRR unchanged      Progression   Progression Continue to progress workloads to maintain intensity without signs/symptoms of physical distress.    Average METs 1.9      Resistance Training   Training Prescription Yes    Weight 2 lbs    Reps 10-15    Time 10 Minutes      Interval Training   Interval Training No      NuStep   Level 1    SPM 68    Minutes 28    METs 1.9              Nutrition:  Target Goals: Understanding of nutrition guidelines, daily intake of sodium 1500mg , cholesterol 200mg , calories 30% from fat and 7% or less from saturated fats, daily to have 5 or more servings of fruits and vegetables.  Biometrics:  Pre Biometrics - 07/16/22 1050       Pre Biometrics   Waist Circumference 37 inches    Hip Circumference 42 inches    Waist to Hip Ratio 0.88 %    Triceps Skinfold 25 mm    % Body Fat 39.7 %    Grip Strength 30 kg    Flexibility --   Not performed due to chronic back problems   Single Leg Stand --   Not performed: Pt uses cane/walk and has impaired balance             Nutrition Therapy Plan and Nutrition Goals:  Nutrition Therapy & Goals - 08/23/22 1543       Nutrition Therapy   Diet Heart healthy diet    Drug/Food Interactions Statins/Certain Fruits      Personal Nutrition Goals   Nutrition Goal Patient to improve diet quality by using the plate method as a guide for meal planning to include lean protein/plant protein, fruits, vegetables, whole grains, nonfat dairy as part of a well-balanced diet.    Personal Goal #2 Patient to identify strategies for reducing cardiovascular risk by attending the [employer] education and nutrition series weekly.    Personal Goal #3 Patient to limit sodium to 1500mg  per day    Comments Goals in progress. Caoilinn continues to attend the [employer] education and nutrition series regularly. Irma recently stopped smoking. She will continue to benefit from participation in intensive cardiac rehab for nutrition, exercise, and lfiestyle modification.      Intervention Plan   Intervention Prescribe, educate  and counsel regarding individualized specific dietary modifications aiming towards targeted core components such as weight, hypertension, lipid management, diabetes, heart failure and other comorbidities.;Nutrition handout(s) given to patient.    Expected Outcomes Short Term Goal: Understand  basic principles of dietary content, such as calories, fat, sodium, cholesterol and nutrients.;Long Term Goal: Adherence to prescribed nutrition plan.             Nutrition Assessments:  MEDIFICTS Score Key: ?70 Need to make dietary changes  40-70 Heart Healthy Diet ? 40 Therapeutic Level Cholesterol Diet    Picture Your Plate Scores: <78 Unhealthy dietary pattern with much room for improvement. 41-50 Dietary pattern unlikely to meet recommendations for good health and room for improvement. 51-60 More healthful dietary pattern, with some room for improvement.  >60 Healthy dietary pattern, although there may be some specific behaviors that could be improved.    Nutrition Goals Re-Evaluation:  Nutrition Goals Re-Evaluation     Row Name 07/25/22 0837 08/23/22 1543           Goals   Current Weight 154 lb 15.7 oz (70.3 kg) 153 lb (69.4 kg)      Comment lipoprotein A 210.6, Lipids WNL -HDL 36, history of iron deficiency and B12 deficiency. B12 is very low normal No new labs; most recent labs lipoprotein A 210.6, Lipids WNL -HDL 36, history of iron deficiency and B12 deficiency. B12 is very low normal      Expected Outcome Serra recently stopped smoking. She will continue to benefit from participation in intensive cardiac rehab for nutrition, exercise, and lfiestyle modification. Goals in progress. Terita continues to attend the [facility] education and nutrition series regularly. Sista recently stopped smoking. She will continue to benefit from participation in intensive cardiac rehab for nutrition, exercise, and lfiestyle modification.               Nutrition Goals Re-Evaluation:  Nutrition Goals Re-Evaluation     Row Name 07/25/22 0837 08/23/22 1543           Goals   Current Weight 154 lb 15.7 oz (70.3 kg) 153 lb (69.4 kg)      Comment lipoprotein A 210.6, Lipids WNL -HDL 36, history of iron deficiency and B12 deficiency. B12 is very low normal No new labs; most recent  labs lipoprotein A 210.6, Lipids WNL -HDL 36, history of iron deficiency and B12 deficiency. B12 is very low normal      Expected Outcome Ciani recently stopped smoking. She will continue to benefit from participation in intensive cardiac rehab for nutrition, exercise, and lfiestyle modification. Goals in progress. Zaeleigh continues to attend the [facility] education and nutrition series regularly. Caroll recently stopped smoking. She will continue to benefit from participation in intensive cardiac rehab for nutrition, exercise, and lfiestyle modification.               Nutrition Goals Discharge (Final Nutrition Goals Re-Evaluation):  Nutrition Goals Re-Evaluation - 08/23/22 1543       Goals   Current Weight 153 lb (69.4 kg)    Comment No new labs; most recent labs lipoprotein A 210.6, Lipids WNL -HDL 36, history of iron deficiency and B12 deficiency. B12 is very low normal    Expected Outcome Goals in progress. Maud continues to attend the [facility] education and nutrition series regularly. Metta recently stopped smoking. She will continue to benefit from participation in intensive cardiac rehab for nutrition, exercise, and lfiestyle modification.  Psychosocial: Target Goals: Acknowledge presence or absence of significant depression and/or stress, maximize coping skills, provide positive support system. Participant is able to verbalize types and ability to use techniques and skills needed for reducing stress and depression.  Initial Review & Psychosocial Screening:  Initial Psych Review & Screening - 07/16/22 1349       Initial Review   Current issues with None Identified      Family Dynamics   Good Support System? Yes    Comments Pt has her 2 sons and her granddaughter for support      Barriers   Psychosocial barriers to participate in program There are no identifiable barriers or psychosocial needs.      Screening Interventions   Interventions Encouraged to  exercise             Quality of Life Scores:  Quality of Life - 07/16/22 1352       Quality of Life   Select Quality of Life      Quality of Life Scores   Health/Function Pre 24.32 %    Socioeconomic Pre 29 %    Psych/Spiritual Pre 30 %    Family Pre 28.8 %    GLOBAL Pre 27.14 %            Scores of 19 and below usually indicate a poorer quality of life in these areas.  A difference of  2-3 points is a clinically meaningful difference.  A difference of 2-3 points in the total score of the Quality of Life Index has been associated with significant improvement in overall quality of life, self-image, physical symptoms, and general health in studies assessing change in quality of life.  PHQ-9: Review Flowsheet       07/16/2022  Depression screen PHQ 2/9  Decreased Interest 0  Down, Depressed, Hopeless 0  PHQ - 2 Score 0  Altered sleeping 0  Tired, decreased energy 0  Change in appetite 0  Feeling bad or failure about yourself  0  Trouble concentrating 0  Moving slowly or fidgety/restless 0  Suicidal thoughts 0  PHQ-9 Score 0  Difficult doing work/chores Not difficult at all   Interpretation of Total Score  Total Score Depression Severity:  1-4 = Minimal depression, 5-9 = Mild depression, 10-14 = Moderate depression, 15-19 = Moderately severe depression, 20-27 = Severe depression   Psychosocial Evaluation and Intervention:   Psychosocial Re-Evaluation:  Psychosocial Re-Evaluation     Row Name 07/24/22 1709 08/08/22 1647 09/03/22 1635         Psychosocial Re-Evaluation   Current issues with None Identified None Identified None Identified     Comments -- Glady's said her grandaughter that brings her to exercise has cone back to [place] for [employer] duty. Glady's is now driving herself --     Interventions Encouraged to attend Cardiac Rehabilitation for the exercise Encouraged to attend Cardiac Rehabilitation for the exercise Encouraged to attend Cardiac  Rehabilitation for the exercise     Continue Psychosocial Services  No Follow up required No Follow up required No Follow up required              Psychosocial Discharge (Final Psychosocial Re-Evaluation):  Psychosocial Re-Evaluation - 09/03/22 1635       Psychosocial Re-Evaluation   Current issues with None Identified    Interventions Encouraged to attend Cardiac Rehabilitation for the exercise    Continue Psychosocial Services  No Follow up required  Vocational Rehabilitation: Provide vocational rehab assistance to qualifying candidates.   Vocational Rehab Evaluation & Intervention:  Vocational Rehab - 07/16/22 1350       Initial Vocational Rehab Evaluation & Intervention   Assessment shows need for Vocational Rehabilitation No   Pt is retired            Education: Education Goals: Education classes will be provided on a weekly basis, covering required topics. Participant will state understanding/return demonstration of topics presented.    Education     Row Name 07/24/22 1300     Education   Cardiac Education Topics [facility]   Orthoptist   Educator Dietitian   Weekly Topic Tasty Appetizers and Snacks   Instruction Review Code 1- Verbalizes Understanding   Class Start Time 1150   Class Stop Time 1226   Class Time Calculation (min) 36 min    Row Name 07/26/22 1200     Education   Cardiac Education Topics [facility]   Licensed conveyancer Nutrition   Instruction Review Code 1- Verbalizes Understanding   Class Start Time 1150   Class Stop Time 1226   Class Time Calculation (min) 36 min    Row Name 07/29/22 1300     Education   Cardiac Education Topics [facility]   Select Workshops     Workshops   Educator Exercise Physiologist   Select Exercise   Exercise Workshop Exercise Basics: Diplomatic Services operational officer   Instruction Review Code 1- Verbalizes  Understanding   Class Start Time 1150   Class Stop Time 1235   Class Time Calculation (min) 45 min    Row Name 08/02/22 1300     Education   Cardiac Education Topics [facility]   Nurse, children's   Educator Dietitian   Nutrition Calorie Density   Instruction Review Code 1- Verbalizes Understanding   Class Start Time 1145   Class Stop Time 1225   Class Time Calculation (min) 40 min    Row Name 08/05/22 1400     Education   Cardiac Education Topics [facility]   Select Core Videos     Core Videos   Educator Dietitian   Nutrition Nutrition Action Plan   Instruction Review Code 1- Verbalizes Understanding   Class Start Time 1145   Class Stop Time 1219   Class Time Calculation (min) 34 min    Row Name 08/07/22 1300     Education   Cardiac Education Topics [facility]   Customer service manager   Weekly Topic Efficiency Cooking - Meals in a Snap   Instruction Review Code 1- Verbalizes Understanding   Class Start Time 1140   Class Stop Time 1220   Class Time Calculation (min) 40 min    Row Name 08/09/22 1400     Education   Cardiac Education Topics [facility]   Psychologist, forensic Exercise Education   Exercise Education Move It!   Instruction Review Code 1- Verbalizes Understanding   Class Start Time 1150   Class Stop Time 1230   Class Time Calculation (min) 40 min    Row Name 08/16/22 1400     Education   Cardiac Education Topics Pritikin   Psychologist, sport and exercise  Core Videos   Educator Exercise Industrial/product designer Education   General Education Hypertension and Heart Disease   Instruction Review Code 1- Verbalizes Understanding   Class Start Time 1147   Class Stop Time 1220   Class Time Calculation (min) 33 min    Row Name 08/19/22 1300     Education   Cardiac Education Topics Pritikin   Select Core Videos     Core Videos    Educator Nurse   Select Exercise Education   Exercise Education Biomechanial Limitations   Instruction Review Code 1- Verbalizes Understanding   Class Start Time 1156   Class Stop Time 1232   Class Time Calculation (min) 36 min    Row Name 08/21/22 1300     Education   Cardiac Education Topics Pritikin   Customer service manager   Weekly Topic Comforting Weekend Breakfasts   Instruction Review Code 1- Verbalizes Understanding   Class Start Time 1135   Class Stop Time 1223   Class Time Calculation (min) 48 min    Row Name 08/23/22 1400     Education   Cardiac Education Topics Pritikin   Licensed conveyancer Nutrition   Nutrition Dining Out - Part 1   Instruction Review Code 1- Verbalizes Understanding   Class Start Time 1145   Class Stop Time 1222   Class Time Calculation (min) 37 min    Row Name 08/26/22 1400     Education   Cardiac Education Topics Pritikin   Nurse, children's   Educator Dietitian   Select Nutrition   Nutrition Facts on Fat   Instruction Review Code 1- Verbalizes Understanding   Class Start Time 1145   Class Stop Time 1225   Class Time Calculation (min) 40 min    Row Name 08/28/22 1200     Education   Cardiac Education Topics Pritikin   Customer service manager   Weekly Topic Fast Evening Meals   Instruction Review Code 1- Verbalizes Understanding   Class Start Time 1140   Class Stop Time 1215   Class Time Calculation (min) 35 min    Row Name 08/30/22 1300     Education   Cardiac Education Topics Pritikin   Licensed conveyancer Nutrition   Nutrition Vitamins and Minerals   Instruction Review Code 1- Verbalizes Understanding   Class Start Time 1140   Class Stop Time 1225   Class Time Calculation (min) 45 min    Row Name 09/04/22 1400      Education   Cardiac Education Topics Pritikin   Advertising account planner   Weekly Topic International Cuisine- Spotlight on the [employer] Zones   Instruction Review Code 1- Verbalizes Understanding   Class Start Time 1140   Class Stop Time 1212   Class Time Calculation (min) 32 min            Core Videos: Exercise    Move It!  Clinical staff conducted group or individual video education with verbal and written material and guidebook.  Patient learns the recommended Pritikin  exercise program. Exercise with the goal of living a long, healthy life. Some of the health benefits of exercise include controlled diabetes, healthier blood pressure levels, improved cholesterol levels, improved heart and lung capacity, improved sleep, and better body composition. Everyone should speak with their doctor before starting or changing an exercise routine.  Biomechanical Limitations Clinical staff conducted group or individual video education with verbal and written material and guidebook.  Patient learns how biomechanical limitations can impact exercise and how we can mitigate and possibly overcome limitations to have an impactful and balanced exercise routine.  Body Composition Clinical staff conducted group or individual video education with verbal and written material and guidebook.  Patient learns that body composition (ratio of muscle mass to fat mass) is a key component to assessing overall fitness, rather than body weight alone. Increased fat mass, especially visceral belly fat, can put [place] at increased risk for metabolic syndrome, type 2 diabetes, heart disease, and even death. It is recommended to combine diet and exercise (cardiovascular and resistance training) to improve your body composition. Seek guidance from your physician and exercise physiologist before implementing an exercise  routine.  Exercise Action Plan Clinical staff conducted group or individual video education with verbal and written material and guidebook.  Patient learns the recommended strategies to achieve and enjoy long-term exercise adherence, including variety, self-motivation, self-efficacy, and positive decision making. Benefits of exercise include fitness, good health, weight management, more energy, better sleep, less stress, and overall well-being.  Medical   Heart Disease Risk Reduction Clinical staff conducted group or individual video education with verbal and written material and guidebook.  Patient learns our heart is our most vital organ as it circulates oxygen, nutrients, white blood cells, and hormones throughout the entire body, and carries waste away. Data supports a plant-based eating plan like the Pritikin Program for its effectiveness in slowing progression of and reversing heart disease. The video provides a number of recommendations to address heart disease.   Metabolic Syndrome and Belly Fat  Clinical staff conducted group or individual video education with verbal and written material and guidebook.  Patient learns what metabolic syndrome is, how it leads to heart disease, and how one can reverse it and keep it from coming back. You have metabolic syndrome if you have 3 of the following 5 criteria: abdominal obesity, high blood pressure, high triglycerides, low HDL cholesterol, and high blood sugar.  Hypertension and Heart Disease Clinical staff conducted group or individual video education with verbal and written material and guidebook.  Patient learns that high blood pressure, or hypertension, is very common in the [place]. Hypertension is largely due to excessive salt intake, but other important risk factors include being overweight, physical inactivity, drinking too much alcohol, smoking, and not eating enough potassium from fruits and vegetables. High blood pressure is a  leading risk factor for heart attack, stroke, congestive heart failure, dementia, kidney failure, and premature death. Long-term effects of excessive salt intake include stiffening of the arteries and thickening of heart muscle and organ damage. Recommendations include ways to reduce hypertension and the risk of heart disease.  Diseases of Our Time - Focusing on Diabetes Clinical staff conducted group or individual video education with verbal and written material and guidebook.  Patient learns why the best way to stop diseases of our time is prevention, through food and other lifestyle changes. Medicine (such as prescription pills and surgeries) is often only a Band-Aid on the problem, not a long-term solution. Most common diseases  of our time include obesity, type 2 diabetes, hypertension, heart disease, and cancer. The Pritikin Program is recommended and has been proven to help reduce, reverse, and/or prevent the damaging effects of metabolic syndrome.  Nutrition   Overview of the Pritikin Eating Plan  Clinical staff conducted group or individual video education with verbal and written material and guidebook.  Patient learns about the Pritikin Eating Plan for disease risk reduction. The Pritikin Eating Plan emphasizes a wide variety of unrefined, minimally-processed carbohydrates, like fruits, vegetables, whole grains, and legumes. Go, Caution, and Stop food choices are explained. Plant-based and lean animal proteins are emphasized. Rationale provided for low sodium intake for blood pressure control, low added sugars for blood sugar stabilization, and low added fats and oils for coronary artery disease risk reduction and weight management.  Calorie Density  Clinical staff conducted group or individual video education with verbal and written material and guidebook.  Patient learns about calorie density and how it impacts the Pritikin Eating Plan. Knowing the characteristics of the food you choose will  help you decide whether those foods will lead to weight gain or weight loss, and whether you want to consume more or less of them. Weight loss is usually a side effect of the Pritikin Eating Plan because of its focus on low calorie-dense foods.  Label Reading  Clinical staff conducted group or individual video education with verbal and written material and guidebook.  Patient learns about the Pritikin recommended label reading guidelines and corresponding recommendations regarding calorie density, added sugars, sodium content, and whole grains.  Dining Out - Part 1  Clinical staff conducted group or individual video education with verbal and written material and guidebook.  Patient learns that restaurant meals can be sabotaging because they can be so high in calories, fat, sodium, and/or sugar. Patient learns recommended strategies on how to positively address this and avoid unhealthy pitfalls.  Facts on Fats  Clinical staff conducted group or individual video education with verbal and written material and guidebook.  Patient learns that lifestyle modifications can be just as effective, if not more so, as many medications for lowering your risk of heart disease. A Pritikin lifestyle can help to reduce your risk of inflammation and atherosclerosis (cholesterol build-up, or plaque, in the artery walls). Lifestyle interventions such as dietary choices and physical activity address the cause of atherosclerosis. A review of the types of fats and their impact on blood cholesterol levels, along with dietary recommendations to reduce fat intake is also included.  Nutrition Action Plan  Clinical staff conducted group or individual video education with verbal and written material and guidebook.  Patient learns how to incorporate Pritikin recommendations into their lifestyle. Recommendations include planning and keeping personal health goals in mind as an important part of their success.  Healthy Mind-Set     Healthy Minds, Bodies, Hearts  Clinical staff conducted group or individual video education with verbal and written material and guidebook.  Patient learns how to identify when they are stressed. Video will discuss the impact of that stress, as well as the many benefits of stress management. Patient will also be introduced to stress management techniques. The way we think, act, and feel has an impact on our hearts.  How Our Thoughts Can Heal Our Hearts  Clinical staff conducted group or individual video education with verbal and written material and guidebook.  Patient learns that negative thoughts can cause depression and anxiety. This can result in negative lifestyle behavior and serious health problems.  Cognitive behavioral therapy is an effective method to help control our thoughts in order to change and improve our emotional outlook.  Additional Videos:  Exercise    Improving Performance  Clinical staff conducted group or individual video education with verbal and written material and guidebook.  Patient learns to use a non-linear approach by alternating intensity levels and lengths of time spent exercising to help burn more calories and lose more body fat. Cardiovascular exercise helps improve heart health, metabolism, hormonal balance, blood sugar control, and recovery from fatigue. Resistance training improves strength, endurance, balance, coordination, reaction time, metabolism, and muscle mass. Flexibility exercise improves circulation, posture, and balance. Seek guidance from your physician and exercise physiologist before implementing an exercise routine and learn your capabilities and proper form for all exercise.  Introduction to Yoga  Clinical staff conducted group or individual video education with verbal and written material and guidebook.  Patient learns about yoga, a discipline of the coming together of mind, breath, and body. The benefits of yoga include improved flexibility,  improved range of motion, better posture and core strength, increased lung function, weight loss, and positive self-image. Yoga's heart health benefits include lowered blood pressure, healthier heart rate, decreased cholesterol and triglyceride levels, improved immune function, and reduced stress. Seek guidance from your physician and exercise physiologist before implementing an exercise routine and learn your capabilities and proper form for all exercise.  Medical   Aging: Enhancing Your Quality of Life  Clinical staff conducted group or individual video education with verbal and written material and guidebook.  Patient learns key strategies and recommendations to stay in good physical health and enhance quality of life, such as prevention strategies, having an advocate, securing a Health Care Proxy and Power of Attorney, and keeping a list of medications and system for tracking them. It also discusses how to avoid risk for bone loss.  Biology of Weight Control  Clinical staff conducted group or individual video education with verbal and written material and guidebook.  Patient learns that weight gain occurs because we consume more calories than we burn (eating more, moving less). Even if your body weight is normal, you may have higher ratios of fat compared to muscle mass. Too much body fat puts you at increased risk for cardiovascular disease, heart attack, stroke, type 2 diabetes, and obesity-related cancers. In addition to exercise, following the Pritikin Eating Plan can help reduce your risk.  Decoding Lab Results  Clinical staff conducted group or individual video education with verbal and written material and guidebook.  Patient learns that lab test reflects one measurement whose values change over time and are influenced by many factors, including medication, stress, sleep, exercise, food, hydration, pre-existing medical conditions, and more. It is recommended to use the knowledge from this  video to become more involved with your lab results and evaluate your numbers to speak with your doctor.   Diseases of Our Time - Overview  Clinical staff conducted group or individual video education with verbal and written material and guidebook.  Patient learns that according to the CDC, 50% to 70% of chronic diseases (such as obesity, type 2 diabetes, elevated lipids, hypertension, and heart disease) are avoidable through lifestyle improvements including healthier food choices, listening to satiety cues, and increased physical activity.  Sleep Disorders Clinical staff conducted group or individual video education with verbal and written material and guidebook.  Patient learns how good quality and duration of sleep are important to overall health and well-being. Patient also learns about  sleep disorders and how they impact health along with recommendations to address them, including discussing with a physician.  Nutrition  Dining Out - Part 2 Clinical staff conducted group or individual video education with verbal and written material and guidebook.  Patient learns how to plan ahead and communicate in order to maximize their dining experience in a healthy and nutritious manner. Included are recommended food choices based on the type of restaurant the patient is visiting.   Fueling a Banker conducted group or individual video education with verbal and written material and guidebook.  There is a strong connection between our food choices and our health. Diseases like obesity and type 2 diabetes are very prevalent and are in large-part due to lifestyle choices. The Pritikin Eating Plan provides plenty of food and hunger-curbing satisfaction. It is easy to follow, affordable, and helps reduce health risks.  Menu Workshop  Clinical staff conducted group or individual video education with verbal and written material and guidebook.  Patient learns that restaurant meals can  sabotage health goals because they are often packed with calories, fat, sodium, and sugar. Recommendations include strategies to plan ahead and to communicate with the manager, chef, or server to help order a healthier meal.  Planning Your Eating Strategy  Clinical staff conducted group or individual video education with verbal and written material and guidebook.  Patient learns about the Pritikin Eating Plan and its benefit of reducing the risk of disease. The Pritikin Eating Plan does not focus on calories. Instead, it emphasizes high-quality, nutrient-rich foods. By knowing the characteristics of the foods, we choose, we can determine their calorie density and make informed decisions.  Targeting Your Nutrition Priorities  Clinical staff conducted group or individual video education with verbal and written material and guidebook.  Patient learns that lifestyle habits have a tremendous impact on disease risk and progression. This video provides eating and physical activity recommendations based on your personal health goals, such as reducing LDL cholesterol, losing weight, preventing or controlling type 2 diabetes, and reducing high blood pressure.  Vitamins and Minerals  Clinical staff conducted group or individual video education with verbal and written material and guidebook.  Patient learns different ways to obtain key vitamins and minerals, including through a recommended healthy diet. It is important to discuss all supplements you take with your doctor.   Healthy Mind-Set    Smoking Cessation  Clinical staff conducted group or individual video education with verbal and written material and guidebook.  Patient learns that cigarette smoking and tobacco addiction pose a serious health risk which affects millions of people. Stopping smoking will significantly reduce the risk of heart disease, lung disease, and many forms of cancer. Recommended strategies for quitting are covered, including  working with your doctor to develop a successful plan.  Culinary   Becoming a Set designer conducted group or individual video education with verbal and written material and guidebook.  Patient learns that cooking at home can be healthy, cost-effective, quick, and puts them in control. Keys to cooking healthy recipes will include looking at your recipe, assessing your equipment needs, planning ahead, making it simple, choosing cost-effective seasonal ingredients, and limiting the use of added fats, salts, and sugars.  Cooking - Breakfast and Snacks  Clinical staff conducted group or individual video education with verbal and written material and guidebook.  Patient learns how important breakfast is to satiety and nutrition through the entire day. Recommendations include key foods to eat  during breakfast to help stabilize blood sugar levels and to prevent overeating at meals later in the day. Planning ahead is also a key component.  Cooking - Educational psychologist conducted group or individual video education with verbal and written material and guidebook.  Patient learns eating strategies to improve overall health, including an approach to cook more at home. Recommendations include thinking of animal protein as a side on your plate rather than center stage and focusing instead on lower calorie dense options like vegetables, fruits, whole grains, and plant-based proteins, such as beans. Making sauces in large quantities to freeze for later and leaving the skin on your vegetables are also recommended to maximize your experience.  Cooking - Healthy Salads and Dressing Clinical staff conducted group or individual video education with verbal and written material and guidebook.  Patient learns that vegetables, fruits, whole grains, and legumes are the foundations of the Pritikin Eating Plan. Recommendations include how to incorporate each of these in flavorful and healthy  salads, and how to create homemade salad dressings. Proper handling of ingredients is also covered. Cooking - Soups and [employer] - Soups and Desserts Clinical staff conducted group or individual video education with verbal and written material and guidebook.  Patient learns that Pritikin soups and desserts make for easy, nutritious, and delicious snacks and meal components that are low in sodium, fat, sugar, and calorie density, while high in vitamins, minerals, and filling fiber. Recommendations include simple and healthy ideas for soups and desserts.   Overview     The Pritikin Solution Program Overview Clinical staff conducted group or individual video education with verbal and written material and guidebook.  Patient learns that the results of the Pritikin Program have been documented in more than 100 articles published in peer-reviewed journals, and the benefits include reducing risk factors for (and, in some cases, even reversing) high cholesterol, high blood pressure, type 2 diabetes, obesity, and more! An overview of the three key pillars of the Pritikin Program will be covered: eating well, doing regular exercise, and having a healthy mind-set.  WORKSHOPS  Exercise: Exercise Basics: Building Your Action Plan Clinical staff led group instruction and group discussion with PowerPoint presentation and patient guidebook. To enhance the learning environment the use of posters, models and videos may be added. At the conclusion of this workshop, patients will comprehend the difference between physical activity and exercise, as well as the benefits of incorporating both, into their routine. Patients will understand the FITT (Frequency, Intensity, Time, and Type) principle and how to use it to build an exercise action plan. In addition, safety concerns and other considerations for exercise and cardiac rehab will be addressed by the presenter. The purpose of this lesson is to promote a  comprehensive and effective weekly exercise routine in order to improve patients' overall level of fitness.   Managing Heart Disease: Your Path to a Healthier Heart Clinical staff led group instruction and group discussion with PowerPoint presentation and patient guidebook. To enhance the learning environment the use of posters, models and videos may be added.At the conclusion of this workshop, patients will understand the anatomy and physiology of the heart. Additionally, they will understand how Pritikin's three pillars impact the risk factors, the progression, and the management of heart disease.  The purpose of this lesson is to provide a high-level overview of the heart, heart disease, and how the Pritikin lifestyle positively impacts risk factors.  Exercise Biomechanics Clinical staff led group instruction  and group discussion with PowerPoint presentation and patient guidebook. To enhance the learning environment the use of posters, models and videos may be added. Patients will learn how the structural parts of their bodies function and how these functions impact their daily activities, movement, and exercise. Patients will learn how to promote a neutral spine, learn how to manage pain, and identify ways to improve their physical movement in order to promote healthy living. The purpose of this lesson is to expose patients to common physical limitations that impact physical activity. Participants will learn practical ways to adapt and manage aches and pains, and to minimize their effect on regular exercise. Patients will learn how to maintain good posture while sitting, walking, and lifting.  Balance Training and Fall Prevention  Clinical staff led group instruction and group discussion with PowerPoint presentation and patient guidebook. To enhance the learning environment the use of posters, models and videos may be added. At the conclusion of this workshop, patients will understand the  importance of their sensorimotor skills (vision, proprioception, and the vestibular system) in maintaining their ability to balance as they age. Patients will apply a variety of balancing exercises that are appropriate for their current level of function. Patients will understand the common causes for poor balance, possible solutions to these problems, and ways to modify their physical environment in order to minimize their fall risk. The purpose of this lesson is to teach patients about the importance of maintaining balance as they age and ways to minimize their risk of falling.  WORKSHOPS   Nutrition:  Fueling a Ship broker led group instruction and group discussion with PowerPoint presentation and patient guidebook. To enhance the learning environment the use of posters, models and videos may be added. Patients will review the foundational principles of the Pritikin Eating Plan and understand what constitutes a serving size in each of the food groups. Patients will also learn Pritikin-friendly foods that are better choices when away from home and review make-ahead meal and snack options. Calorie density will be reviewed and applied to three nutrition priorities: weight maintenance, weight loss, and weight gain. The purpose of this lesson is to reinforce (in a group setting) the key concepts around what patients are recommended to eat and how to apply these guidelines when away from home by planning and selecting Pritikin-friendly options. Patients will understand how calorie density may be adjusted for different weight management goals.  Mindful Eating  Clinical staff led group instruction and group discussion with PowerPoint presentation and patient guidebook. To enhance the learning environment the use of posters, models and videos may be added. Patients will briefly review the concepts of the Pritikin Eating Plan and the importance of low-calorie dense foods. The concept of mindful  eating will be introduced as well as the importance of paying attention to internal hunger signals. Triggers for non-hunger eating and techniques for dealing with triggers will be explored. The purpose of this lesson is to provide patients with the opportunity to review the basic principles of the Pritikin Eating Plan, discuss the value of eating mindfully and how to measure internal cues of hunger and fullness using the Hunger Scale. Patients will also discuss reasons for non-hunger eating and learn strategies to use for controlling emotional eating.  Targeting Your Nutrition Priorities Clinical staff led group instruction and group discussion with PowerPoint presentation and patient guidebook. To enhance the learning environment the use of posters, models and videos may be added. Patients will learn how to determine  their genetic susceptibility to disease by reviewing their family history. Patients will gain insight into the importance of diet as part of an overall healthy lifestyle in mitigating the impact of genetics and other environmental insults. The purpose of this lesson is to provide patients with the opportunity to assess their personal nutrition priorities by looking at their family history, their own health history and current risk factors. Patients will also be able to discuss ways of prioritizing and modifying the Pritikin Eating Plan for their highest risk areas  Menu  Clinical staff led group instruction and group discussion with PowerPoint presentation and patient guidebook. To enhance the learning environment the use of posters, models and videos may be added. Using menus brought in from [employer], or printed from [employer], patients will apply the Pritikin dining out guidelines that were presented in the [employer] video. Patients will also be able to practice these guidelines in a variety of provided scenarios. The purpose of this lesson is to provide  patients with the opportunity to practice hands-on learning of the Pritikin Dining Out guidelines with actual menus and practice scenarios.  Label Reading Clinical staff led group instruction and group discussion with PowerPoint presentation and patient guidebook. To enhance the learning environment the use of posters, models and videos may be added. Patients will review and discuss the Pritikin label reading guidelines presented in Pritikin's Label Reading Educational series video. Using fool labels brought in from local grocery stores and markets, patients will apply the label reading guidelines and determine if the packaged food meet the Pritikin guidelines. The purpose of this lesson is to provide patients with the opportunity to review, discuss, and practice hands-on learning of the Pritikin Label Reading guidelines with actual packaged food labels. Cooking School  Pritikin's [employer] are designed to teach patients ways to prepare quick, simple, and affordable recipes at home. The importance of nutrition's role in chronic disease risk reduction is reflected in its emphasis in the overall Pritikin program. By learning how to prepare essential core Pritikin Eating Plan recipes, patients will increase control over what they eat; be able to customize the flavor of foods without the use of added salt, sugar, or fat; and improve the quality of the food they consume. By learning a set of core recipes which are easily assembled, quickly prepared, and affordable, patients are more likely to prepare more healthy foods at home. These workshops focus on convenient breakfasts, simple entres, side dishes, and desserts which can be prepared with minimal effort and are consistent with nutrition recommendations for cardiovascular risk reduction. Cooking [employer] are taught by a Armed forces logistics/support/administrative officer (RD) who has been trained by the [employer]. The chef or RD has a clear  understanding of the importance of minimizing - if not completely eliminating - added fat, sugar, and sodium in recipes. Throughout the series of Cooking School Workshop sessions, patients will learn about healthy ingredients and efficient methods of cooking to build confidence in their capability to prepare    Cooking School weekly topics:  Adding Flavor- Sodium-Free  Fast and Healthy Breakfasts  Powerhouse Plant-Based Proteins  Satisfying Salads and Dressings  Simple Sides and Sauces  International Cuisine-Spotlight on the [employer] Zones  Delicious Desserts  Savory Soups  [employer] - Meals in a Astronomer Appetizers and Snacks  Comforting Weekend Breakfasts  One-Pot Wonders   Fast Evening Meals  Landscape architect Your Pritikin Plate  WORKSHOPS  Healthy Mindset (Psychosocial):  Focused Goals, Sustainable Changes Clinical staff led group instruction and group discussion with PowerPoint presentation and patient guidebook. To enhance the learning environment the use of posters, models and videos may be added. Patients will be able to apply effective goal setting strategies to establish at least one personal goal, and then take consistent, meaningful action toward that goal. They will learn to identify common barriers to achieving personal goals and develop strategies to overcome them. Patients will also gain an understanding of how our mind-set can impact our ability to achieve goals and the importance of cultivating a positive and growth-oriented mind-set. The purpose of this lesson is to provide patients with a deeper understanding of how to set and achieve personal goals, as well as the tools and strategies needed to overcome common obstacles which may arise along the way.  From Head to Heart: The Power of a Healthy Outlook  Clinical staff led group instruction and group discussion with PowerPoint presentation and patient guidebook. To enhance the learning  environment the use of posters, models and videos may be added. Patients will be able to recognize and describe the impact of emotions and mood on physical health. They will discover the importance of self-care and explore self-care practices which may work for them. Patients will also learn how to utilize the 4 C's to cultivate a healthier outlook and better manage stress and challenges. The purpose of this lesson is to demonstrate to patients how a healthy outlook is an essential part of maintaining good health, especially as they continue their cardiac rehab journey.  Healthy Sleep for a Healthy Heart Clinical staff led group instruction and group discussion with PowerPoint presentation and patient guidebook. To enhance the learning environment the use of posters, models and videos may be added. At the conclusion of this workshop, patients will be able to demonstrate knowledge of the importance of sleep to overall health, well-being, and quality of life. They will understand the symptoms of, and treatments for, common sleep disorders. Patients will also be able to identify daytime and nighttime behaviors which impact sleep, and they will be able to apply these tools to help manage sleep-related challenges. The purpose of this lesson is to provide patients with a general overview of sleep and outline the importance of quality sleep. Patients will learn about a few of the most common sleep disorders. Patients will also be introduced to the concept of "sleep hygiene," and discover ways to self-manage certain sleeping problems through simple daily behavior changes. Finally, the workshop will motivate patients by clarifying the links between quality sleep and their goals of heart-healthy living.   Recognizing and Reducing Stress Clinical staff led group instruction and group discussion with PowerPoint presentation and patient guidebook. To enhance the learning environment the use of posters, models and videos  may be added. At the conclusion of this workshop, patients will be able to understand the types of stress reactions, differentiate between acute and chronic stress, and recognize the impact that chronic stress has on their health. They will also be able to apply different coping mechanisms, such as reframing negative self-talk. Patients will have the opportunity to practice a variety of stress management techniques, such as deep abdominal breathing, progressive muscle relaxation, and/or guided imagery.  The purpose of this lesson is to educate patients on the role of stress in their lives and to provide healthy techniques for coping with it.  Learning Barriers/Preferences:  Learning Barriers/Preferences - 07/16/22 1352  Learning Barriers/Preferences   Learning Barriers Sight;Hearing    Learning Preferences Pictoral;Computer/Internet;Video             Education Topics:  Knowledge Questionnaire Score:  Knowledge Questionnaire Score - 07/16/22 1404       Knowledge Questionnaire Score   Pre Score 24/28             Core Components/Risk Factors/Patient Goals at Admission:  Personal Goals and Risk Factors at Admission - 07/16/22 1402       Core Components/Risk Factors/Patient Goals on Admission   Tobacco Cessation --    Number of packs per day --    Intervention --    Expected Outcomes --             Core Components/Risk Factors/Patient Goals Review:   Goals and Risk Factor Review     Row Name 07/24/22 1711 08/08/22 1649 09/03/22 1636         Core Components/Risk Factors/Patient Goals Review   Personal Goals Review Tobacco Cessation;Hypertension;Lipids Tobacco Cessation;Hypertension;Lipids Tobacco Cessation;Hypertension;Lipids     Review Glady's started intensive cardiac rehab on 07/24/22. Glady's did well with exercise for her fitness level as Glady's is deconditoned. Vital signs were stable Glady's is doing well with exercise at  intensive cardiac rehab on  07/24/22.  Vital signs have been  stable. Glady's is enjoying participating in the program and says she feels stronger Glady's continues to do well with exercise at  intensive cardiac rehab   Vital signs remain  stable. Glady's is enjoying participating in the program and says she feels stronger. Sunnye has lost 1.8 kg,     Expected Outcomes Glady's will continue to participate in intensive cardiac rehab for exercise nutrition and lifestyle modifications Glady's will continue to participate in intensive cardiac rehab for exercise nutrition and lifestyle modifications Glady's will continue to participate in intensive cardiac rehab for exercise nutrition and lifestyle modifications              Core Components/Risk Factors/Patient Goals at Discharge (Final Review):   Goals and Risk Factor Review - 09/03/22 1636       Core Components/Risk Factors/Patient Goals Review   Personal Goals Review Tobacco Cessation;Hypertension;Lipids    Review Glady's continues to do well with exercise at  intensive cardiac rehab   Vital signs remain  stable. Glady's is enjoying participating in the program and says she feels stronger. Laylanni has lost 1.8 kg,    Expected Outcomes Glady's will continue to participate in intensive cardiac rehab for exercise nutrition and lifestyle modifications             ITP Comments:  ITP Comments     Row Name 07/16/22 1110 07/24/22 1659 08/08/22 1646 09/03/22 1634     ITP Comments Armanda Magic, MD - Medical Director.  Introduction to the [facility] / Intensive Cardiac Rehab. Initial orientation packet reviewed with the patient. 30 Day ITP Review. Glady's started intensive cardiac rehab on 07/24/22 and did well for her fitness level 30 Day ITP Review. Glady's has good attendance and participation in intensive cardiac rehab 30 Day ITP Review. Glady's continues to have good attendance and participation in intensive cardiac rehab. Glady's is enjoying coming to  exercise            Comments: See ITP comments.

## 2022-09-04 ENCOUNTER — Encounter (HOSPITAL_COMMUNITY)
Admission: RE | Admit: 2022-09-04 | Discharge: 2022-09-04 | Disposition: A | Discharge status: Home / Self Care | Payer: 59 | Source: Ambulatory Visit | Attending: Interventional Cardiology | Admitting: Interventional Cardiology

## 2022-09-04 DIAGNOSIS — I2111 ST elevation (STEMI) myocardial infarction involving right coronary artery: Secondary | ICD-10-CM

## 2022-09-04 DIAGNOSIS — Z9861 Coronary angioplasty status: Secondary | ICD-10-CM

## 2022-09-04 DIAGNOSIS — Z955 Presence of coronary angioplasty implant and graft: Secondary | ICD-10-CM

## 2022-09-06 ENCOUNTER — Encounter (HOSPITAL_COMMUNITY)
Admission: RE | Admit: 2022-09-06 | Discharge: 2022-09-06 | Disposition: A | Discharge status: Home / Self Care | Payer: 59 | Source: Ambulatory Visit | Attending: Interventional Cardiology | Admitting: Interventional Cardiology

## 2022-09-06 DIAGNOSIS — Z9861 Coronary angioplasty status: Secondary | ICD-10-CM

## 2022-09-06 DIAGNOSIS — I2111 ST elevation (STEMI) myocardial infarction involving right coronary artery: Secondary | ICD-10-CM

## 2022-09-09 ENCOUNTER — Encounter (HOSPITAL_COMMUNITY): Payer: 59

## 2022-09-09 ENCOUNTER — Ambulatory Visit: Payer: Medicare Other | Admitting: Interventional Cardiology

## 2022-09-11 ENCOUNTER — Encounter (HOSPITAL_COMMUNITY)
Admission: RE | Admit: 2022-09-11 | Discharge: 2022-09-11 | Disposition: A | Discharge status: Home / Self Care | Payer: 59 | Source: Ambulatory Visit | Attending: Interventional Cardiology | Admitting: Interventional Cardiology

## 2022-09-11 DIAGNOSIS — Z9861 Coronary angioplasty status: Secondary | ICD-10-CM

## 2022-09-11 DIAGNOSIS — I2111 ST elevation (STEMI) myocardial infarction involving right coronary artery: Secondary | ICD-10-CM

## 2022-09-11 DIAGNOSIS — Z955 Presence of coronary angioplasty implant and graft: Secondary | ICD-10-CM

## 2022-09-13 ENCOUNTER — Encounter (HOSPITAL_COMMUNITY)
Admission: RE | Admit: 2022-09-13 | Discharge: 2022-09-13 | Disposition: A | Discharge status: Home / Self Care | Payer: 59 | Source: Ambulatory Visit | Attending: Interventional Cardiology | Admitting: Interventional Cardiology

## 2022-09-13 DIAGNOSIS — I2111 ST elevation (STEMI) myocardial infarction involving right coronary artery: Secondary | ICD-10-CM | POA: Diagnosis not present

## 2022-09-13 DIAGNOSIS — Z955 Presence of coronary angioplasty implant and graft: Secondary | ICD-10-CM

## 2022-09-13 DIAGNOSIS — Z9861 Coronary angioplasty status: Secondary | ICD-10-CM

## 2022-09-16 ENCOUNTER — Encounter (HOSPITAL_COMMUNITY)
Admission: RE | Admit: 2022-09-16 | Discharge: 2022-09-16 | Disposition: A | Discharge status: Home / Self Care | Payer: 59 | Source: Ambulatory Visit | Attending: Interventional Cardiology | Admitting: Interventional Cardiology

## 2022-09-16 DIAGNOSIS — I2111 ST elevation (STEMI) myocardial infarction involving right coronary artery: Secondary | ICD-10-CM | POA: Diagnosis not present

## 2022-09-16 DIAGNOSIS — Z955 Presence of coronary angioplasty implant and graft: Secondary | ICD-10-CM

## 2022-09-16 DIAGNOSIS — Z9861 Coronary angioplasty status: Secondary | ICD-10-CM

## 2022-09-18 ENCOUNTER — Encounter (HOSPITAL_COMMUNITY)
Admission: RE | Admit: 2022-09-18 | Discharge: 2022-09-18 | Disposition: A | Discharge status: Home / Self Care | Payer: 59 | Source: Ambulatory Visit | Attending: Interventional Cardiology | Admitting: Interventional Cardiology

## 2022-09-18 DIAGNOSIS — Z9861 Coronary angioplasty status: Secondary | ICD-10-CM

## 2022-09-18 DIAGNOSIS — I2111 ST elevation (STEMI) myocardial infarction involving right coronary artery: Secondary | ICD-10-CM

## 2022-09-18 DIAGNOSIS — Z955 Presence of coronary angioplasty implant and graft: Secondary | ICD-10-CM

## 2022-09-18 NOTE — Progress Notes (Signed)
Sent the following communication to [place] [facility]: "Hey Em!  wanted to run this by you.  Above pt with known vessel disease treating medically ( couldn't afford Ranexa).  Reported mid at the end of exercise she took a nitroglycerin this morning for 5/10 chest discomfort relieved after 5 minutes.  First time she has taken NTG since her hospitalization back in February.  Looking back pre exercise bp 98/60 - soft.  hadn't eaten all day, I gave her lemonade and peanut butter graham crackers.  BP increased 118/60, during exercise 128/70.  Anything else warranted after taken NTG for the first time post hospitalization?  Note also has a history of COPD sees [facility]." Await a response. Alanson Aly, BSN Cardiac and Emergency planning/management officer

## 2022-09-18 NOTE — Progress Notes (Signed)
Response from supervising provider: Bernadene Person NP  "Looks like she reported intermittent chest discomfort at her last office visit posthospitalization in 05/2022, relieved with nitroglycerin. I would say if she continues to have symptoms she needs to contact the office to see if we need to titrate antianginal therapy. Otherwise, do not think we need to do anything different today".  Alanson Aly, BSN Cardiac and Emergency planning/management officer

## 2022-09-18 NOTE — Progress Notes (Signed)
Reviewed home exercise Rx with patient today.  Encouraged warm-up, cool-down, and stretching. Reviewed THRR of 58 - 115 and keeping RPE between 11-13. Encouraged to hydrate with activity.  Reviewed weather parameters for temperature and humidity for safe exercise outdoors. Reviewed S/S to terminate exercise and when to call 911 vs MD. Reviewed the use of NTG and pt was encouraged to carry at all times. Pt encouraged to always carry a cell phone for safety when exercising outdoors. Pt verbalized understanding of the home exercise Rx and was provided a copy.   Shadoe Cryan MS, ACSM-CEP, CCRP  

## 2022-09-20 ENCOUNTER — Encounter (HOSPITAL_COMMUNITY)
Admission: RE | Admit: 2022-09-20 | Discharge: 2022-09-20 | Disposition: A | Discharge status: Home / Self Care | Payer: 59 | Source: Ambulatory Visit | Attending: Interventional Cardiology | Admitting: Interventional Cardiology

## 2022-09-20 DIAGNOSIS — I2111 ST elevation (STEMI) myocardial infarction involving right coronary artery: Secondary | ICD-10-CM

## 2022-09-20 DIAGNOSIS — Z955 Presence of coronary angioplasty implant and graft: Secondary | ICD-10-CM

## 2022-09-20 DIAGNOSIS — Z9861 Coronary angioplasty status: Secondary | ICD-10-CM

## 2022-09-25 ENCOUNTER — Encounter (HOSPITAL_COMMUNITY)
Admission: RE | Admit: 2022-09-25 | Discharge: 2022-09-25 | Disposition: A | Discharge status: Home / Self Care | Payer: 59 | Source: Ambulatory Visit | Attending: Interventional Cardiology | Admitting: Interventional Cardiology

## 2022-09-25 DIAGNOSIS — Z955 Presence of coronary angioplasty implant and graft: Secondary | ICD-10-CM

## 2022-09-25 DIAGNOSIS — Z9861 Coronary angioplasty status: Secondary | ICD-10-CM

## 2022-09-25 DIAGNOSIS — I2111 ST elevation (STEMI) myocardial infarction involving right coronary artery: Secondary | ICD-10-CM | POA: Diagnosis not present

## 2022-09-26 NOTE — Progress Notes (Signed)
Remote ICD transmission.   

## 2022-09-27 ENCOUNTER — Encounter (HOSPITAL_COMMUNITY): Payer: 59

## 2022-09-30 ENCOUNTER — Encounter (HOSPITAL_COMMUNITY)
Admission: RE | Admit: 2022-09-30 | Discharge: 2022-09-30 | Disposition: A | Discharge status: Home / Self Care | Payer: 59 | Source: Ambulatory Visit | Attending: Interventional Cardiology | Admitting: Interventional Cardiology

## 2022-09-30 DIAGNOSIS — I2111 ST elevation (STEMI) myocardial infarction involving right coronary artery: Secondary | ICD-10-CM | POA: Diagnosis present

## 2022-09-30 DIAGNOSIS — Z955 Presence of coronary angioplasty implant and graft: Secondary | ICD-10-CM | POA: Diagnosis present

## 2022-09-30 DIAGNOSIS — Z9861 Coronary angioplasty status: Secondary | ICD-10-CM | POA: Diagnosis present

## 2022-10-01 NOTE — Progress Notes (Signed)
Cardiac Individual Treatment Plan  Patient Details  Name: Karen [name] MRN: [id number] Date of Birth: [birth year] Referring Provider:   Flowsheet Row INTENSIVE CARDIAC REHAB ORIENT from 07/16/2022 in [facility]  Referring Provider Lance Muss, MD       Initial Encounter Date:  Flowsheet Row INTENSIVE CARDIAC REHAB ORIENT from 07/16/2022 in [facility]  Date 07/16/22       Visit Diagnosis: 05/08/22 S/P PTCA (percutaneous transluminal coronary angioplasty)  05/08/22 ST elevation myocardial infarction  10/13 /23 S/P coronary artery stent placement x 3 RCA Graft  Patient's Home Medications on Admission:  Current Outpatient Medications:    acetaminophen (TYLENOL) 500 MG tablet, Take 500 mg by mouth every 6 (six) hours as needed for headache., Disp: , Rfl:    albuterol (VENTOLIN HFA) 108 (90 Base) MCG/ACT inhaler, Inhale 2 puffs into the lungs every 6 (six) hours as needed for wheezing or shortness of breath., Disp: 54 g, Rfl: 3   amLODipine (NORVASC) 5 MG tablet, Take 1 tablet (5 mg total) by mouth daily. (Patient not taking: Reported on 08/22/2022), Disp: 90 tablet, Rfl: 3   clopidogrel (PLAVIX) 75 MG tablet, Take 1 tablet (75 mg total) by mouth daily., Disp: 90 tablet, Rfl: 3   cyanocobalamin (VITAMIN B12) 1000 MCG tablet, Take 1 tablet (1,000 mcg total) by mouth daily., Disp: 30 tablet, Rfl: 0   cyclobenzaprine (FLEXERIL) 10 MG tablet, Take 10 mg by mouth 3 (three) times daily as needed for muscle spasms., Disp: , Rfl:    fluticasone (FLONASE) 50 MCG/ACT nasal spray, Place 1 spray into both nostrils daily. (Patient taking differently: Place 1 spray into both nostrils daily as needed for allergies.), Disp: 18.2 mL, Rfl: 2   furosemide (LASIX) 40 MG tablet, Take 1 tablet (40 mg total) by mouth daily., Disp: 90 tablet, Rfl: 3   isosorbide mononitrate (IMDUR) 60 MG 24  hr tablet, Take 1 tablet (60 mg total) by mouth 2 (two) times daily., Disp: 180 tablet, Rfl: 3   lisinopril (ZESTRIL) 10 MG tablet, Take 1 tablet (10 mg total) by mouth daily., Disp: 90 tablet, Rfl: 3   metoprolol tartrate (LOPRESSOR) 25 MG tablet, Take 0.5 tablets (12.5 mg total) by mouth 2 (two) times daily., Disp: 90 tablet, Rfl: 3   naloxone (NARCAN) nasal spray 4 mg/0.1 mL, SMARTSIG:Both Nares, Disp: , Rfl:    nitroGLYCERIN (NITROSTAT) 0.4 MG SL tablet, Place 1 tablet (0.4 mg total) under the tongue every 5 (five) minutes as needed for chest pain (X3 DOSES MAX)., Disp: 25 tablet, Rfl: 11   oxyCODONE-acetaminophen (PERCOCET) 7.5-325 MG tablet, Take 1-2 tablets by mouth every 4 (four) hours as needed for moderate pain (back pain). (Patient not taking: Reported on 08/22/2022), Disp: , Rfl:    pantoprazole (PROTONIX) 40 MG tablet, Take 1 tablet (40 mg total) by mouth daily., Disp: 30 tablet, Rfl: 0   polyethylene glycol (MIRALAX / GLYCOLAX) 17 g packet, Take 17 g by mouth daily., Disp: 14 each, Rfl: 0   ranolazine (RANEXA) 500 MG 12 hr tablet, Take 1 tablet (500 mg total) by mouth 2 (two) times daily., Disp: 180 tablet, Rfl: 3   rosuvastatin (CRESTOR) 20 MG tablet, Take 1 tablet (20 mg total) by mouth daily., Disp: 90 tablet, Rfl: 3  Past Medical History: Past Medical History:  Diagnosis Date   Acute myocardial infarction, unspecified site, episode of care unspecified  Adenocarcinoma, 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     Tobacco Use: Social History   Tobacco Use  Smoking Status Former    Packs/day: 0.50   Years: 54.00   Additional pack years: 0.00   Total pack years: 27.00   Types: Cigarettes   Start date: 1968   Quit date: 06/05/2022   Years since quitting: 0.3  Smokeless Tobacco Never  Tobacco Comments   Smokes 1 cigarette every once in a while [facility] 08/22/22    Labs: Review Flowsheet  More data exists      Latest Ref Rng & Units 02/05/2018 08/16/2019 02/16/2021 05/10/2022 06/06/2022  Labs for ITP Cardiac and Pulmonary Rehab  Cholestrol 100 - 199 mg/dL 409  811  914  91  782   LDL (calc) 0 - 99 mg/dL 71  80  84  45  61   HDL-C >39 mg/dL 74  74  82  36  71   Trlycerides 0 - 149 mg/dL 74  54  73  48  41   Hemoglobin A1c 4.8 - 5.6 % - - 6.0  - -    Capillary Blood Glucose: Lab Results  Component Value Date   GLUCAP 114 (H) 02/22/2022   GLUCAP 116 (H) 12/09/2021   GLUCAP 92 12/08/2020     Exercise Target Goals: Exercise Program Goal: Individual exercise prescription set using results from initial 6 min walk test and THRR while considering  patient's activity barriers and safety.   Exercise Prescription Goal: Initial exercise prescription builds to 30-45 minutes a day of aerobic activity, 2-3 days per week.  Home exercise guidelines will be given to patient during program as part of exercise prescription that the participant will acknowledge.  Activity Barriers & Risk Stratification:  Activity Barriers & Cardiac Risk Stratification - 07/16/22 1355       Activity Barriers & Cardiac Risk Stratification   Activity Barriers Arthritis;Back Problems;Joint Problems;Deconditioning;Muscular Weakness;Chest Pain/Angina;Balance Concerns;Assistive Device    Cardiac Risk Stratification High             6 Minute Walk:  6 Minute Walk     Row Name 07/16/22 1354         6 Minute Walk   Phase --  NUSTEP used     Distance 1049 feet  NUSTEP test     Walk Time 6 minutes     # of Rest Breaks 0     MPH 2     METS 1.8     RPE 11     Perceived Dyspnea  0     VO2  Peak 6.3     Symptoms No     Resting HR 74 bpm     Resting BP 116/70     Resting Oxygen Saturation  100 %     Exercise Oxygen Saturation  during 6 min walk 100 %     Max Ex. HR 84 bpm     Max Ex. BP 114/66     2 Minute Post BP 110/60  Oxygen Initial Assessment:   Oxygen Re-Evaluation:   Oxygen Discharge (Final Oxygen Re-Evaluation):   Initial Exercise Prescription:  Initial Exercise Prescription - 07/16/22 1300       Date of Initial Exercise RX and Referring Provider   Date 07/16/22    Referring Provider Lance Muss, MD    Expected Discharge Date 09/27/22      NuStep   Level 1    SPM 65    Minutes 25    METs 1.8      Prescription Details   Frequency (times per week) 3    Duration Progress to 30 minutes of continuous aerobic without signs/symptoms of physical distress      Intensity   THRR 40-80% of Max Heartrate 58-115    Ratings of Perceived Exertion 11-13    Perceived Dyspnea 0-4      Progression   Progression Continue progressive overload as per policy without signs/symptoms or physical distress.      Resistance Training   Training Prescription Yes    Weight 2 lbs    Reps 10-15             Perform Capillary Blood Glucose checks as needed.  Exercise Prescription Changes:   Exercise Prescription Changes     Row Name 07/24/22 1400 08/27/22 1400 09/18/22 1400         Response to Exercise   Blood Pressure (Admit) 108/72 128/70 98/58     Blood Pressure (Exercise) 122/60 136/78 118/76     Blood Pressure (Exit) 112/54 120/72 102/58     Heart Rate (Admit) 65 bpm 69 bpm 71 bpm     Heart Rate (Exercise) 80 bpm 91 bpm 92 bpm     Heart Rate (Exit) 74 bpm 68 bpm 72 bpm     Rating of Perceived Exertion (Exercise) 11 11 10      Symptoms --  pt reported 5/10 L shoulder pain while exs None None     Comments --  pt first day Reviewed MET sand goals Reviewed METs, goals, HERx     Duration Progress to 30 minutes of  aerobic without  signs/symptoms of physical distress Continue with 30 min of aerobic exercise without signs/symptoms of physical distress. Continue with 30 min of aerobic exercise without signs/symptoms of physical distress.     Intensity THRR unchanged THRR unchanged THRR unchanged       Progression   Progression Continue to progress workloads to maintain intensity without signs/symptoms of physical distress. Continue to progress workloads to maintain intensity without signs/symptoms of physical distress. Continue to progress workloads to maintain intensity without signs/symptoms of physical distress.     Average METs 1.8 1.9 1.7       Resistance Training   Training Prescription --  no weights on Wed Yes No     Weight -- 2 lbs No weights on Wednesdays     Reps -- 10-15 --     Time -- 10 Minutes --       Interval Training   Interval Training -- No No       NuStep   Level 1 1 2      SPM 63 68 59     Minutes 27 28 30      METs 1.8 1.9 1.7       Home Exercise Plan   Plans to continue exercise at -- -- Home (comment)     Frequency -- -- Add 2 additional days to program exercise sessions.     Initial Home  Exercises Provided -- -- 09/18/22              Exercise Comments:   Exercise Comments     Row Name 07/24/22 1437 08/26/22 1504 09/18/22 1432       Exercise Comments Pt had a great first day. Pt completed her exs session despite experiencing 5/10 L shoulder pain. Will continue to monitor and increase her WL as tolerated. Reviewed METs and goals. Progress remains slow. Reviewed METs and goals and home exercise Rx. Progress remains slow. Pt voices she wants to try to walk at home. I will also give her links to chaor exercise videos. Pt verbalized understanding of the home exericse Rx and was provided a copy.              Exercise Goals and Review:   Exercise Goals     Row Name 07/16/22 1356             Exercise Goals   Increase Physical Activity Yes       Intervention Provide  advice, education, support and counseling about physical activity/exercise needs.;Develop an individualized exercise prescription for aerobic and resistive training based on initial evaluation findings, risk stratification, comorbidities and participant's personal goals.       Expected Outcomes Long Term: Exercising regularly at least 3-5 days a week.;Long Term: Add in home exercise to make exercise part of routine and to increase amount of physical activity.;Short Term: Attend rehab on a regular basis to increase amount of physical activity.       Increase Strength and Stamina Yes       Intervention Provide advice, education, support and counseling about physical activity/exercise needs.;Develop an individualized exercise prescription for aerobic and resistive training based on initial evaluation findings, risk stratification, comorbidities and participant's personal goals.       Expected Outcomes Long Term: Improve cardiorespiratory fitness, muscular endurance and strength as measured by increased METs and functional capacity ( );Short Term: Perform resistance training exercises routinely during rehab and add in resistance training at home;Short Term: Increase workloads from initial exercise prescription for resistance, speed, and METs.       Able to understand and use rate of perceived exertion (RPE) scale Yes       Intervention Provide education and explanation on how to use RPE scale       Expected Outcomes Short Term: Able to use RPE daily in rehab to express subjective intensity level;Long Term:  Able to use RPE to guide intensity level when exercising independently       Knowledge and understanding of Target Heart Rate Range (THRR) Yes       Intervention Provide education and explanation of THRR including how the numbers were predicted and where they are located for reference       Expected Outcomes Short Term: Able to state/look up THRR;Short Term: Able to use daily as guideline for intensity  in rehab;Long Term: Able to use THRR to govern intensity when exercising independently       Understanding of Exercise Prescription Yes       Intervention Provide education, explanation, and written materials on patient's individual exercise prescription       Expected Outcomes Short Term: Able to explain program exercise prescription;Long Term: Able to explain home exercise prescription to exercise independently                Exercise Goals Re-Evaluation :  Exercise Goals Re-Evaluation     Row Name 07/24/22 1432 08/27/22 1501 09/18/22  1430         Exercise Goal Re-Evaluation   Exercise Goals Review Increase Physical Activity;Increase Strength and Stamina;Able to understand and use rate of perceived exertion (RPE) scale;Knowledge and understanding of Target Heart Rate Range (THRR);Understanding of Exercise Prescription Increase Physical Activity;Increase Strength and Stamina;Able to understand and use rate of perceived exertion (RPE) scale;Knowledge and understanding of Target Heart Rate Range (THRR);Understanding of Exercise Prescription Increase Physical Activity;Increase Strength and Stamina;Able to understand and use rate of perceived exertion (RPE) scale;Knowledge and understanding of Target Heart Rate Range (THRR);Understanding of Exercise Prescription     Comments Pt first day in program she reported 5/10 L shoulder pain on the NuStep, While exercising pt avg 1.8 Mets, will continue to educate pt on her ExRx, THRR, and RPE scale while increase her WL as tolerated Reviewed METs and goals. Pt had goals of improved mobility and leg strength. Pt voices she has not seen the progress she had hoped for. Will coninu to encourage increaseing time to 30 mintues for endurance. Then will focus on increasing workload if the patient can tolerate it. Reviewed METs and goals and home exercise Rx. Pt will try to start walking at home 2x week and work up to 30 minutes as able.     Expected Outcomes Pt is  motivated to reach her goals, will continue to educate and increase WL as tolerated. Will continue to monitor patient and progress workloads as tolerated. Will continue to monitor patient and progress workloads as tolerated.              Discharge Exercise Prescription (Final Exercise Prescription Changes):  Exercise Prescription Changes - 09/18/22 1400       Response to Exercise   Blood Pressure (Admit) 98/58    Blood Pressure (Exercise) 118/76    Blood Pressure (Exit) 102/58    Heart Rate (Admit) 71 bpm    Heart Rate (Exercise) 92 bpm    Heart Rate (Exit) 72 bpm    Rating of Perceived Exertion (Exercise) 10    Symptoms None    Comments Reviewed METs, goals, HERx    Duration Continue with 30 min of aerobic exercise without signs/symptoms of physical distress.    Intensity THRR unchanged      Progression   Progression Continue to progress workloads to maintain intensity without signs/symptoms of physical distress.    Average METs 1.7      Resistance Training   Training Prescription No    Weight No weights on Wednesdays      Interval Training   Interval Training No      NuStep   Level 2    SPM 59    Minutes 30    METs 1.7      Home Exercise Plan   Plans to continue exercise at Home (comment)    Frequency Add 2 additional days to program exercise sessions.    Initial Home Exercises Provided 09/18/22             Nutrition:  Target Goals: Understanding of nutrition guidelines, daily intake of sodium 1500mg , cholesterol 200mg , calories 30% from fat and 7% or less from saturated fats, daily to have 5 or more servings of fruits and vegetables.  Biometrics:  Pre Biometrics - 07/16/22 1050       Pre Biometrics   Waist Circumference 37 inches    Hip Circumference 42 inches    Waist to Hip Ratio 0.88 %    Triceps Skinfold 25 mm    %  Body Fat 39.7 %    Grip Strength 30 kg    Flexibility --   Not performed due to chronic back problems   Single Leg Stand --    Not performed: Pt uses cane/walk and has impaired balance             Nutrition Therapy Plan and Nutrition Goals:  Nutrition Therapy & Goals - 09/20/22 1317       Nutrition Therapy   Diet Heart healthy diet    Drug/Food Interactions Statins/Certain Fruits      Personal Nutrition Goals   Nutrition Goal Patient to improve diet quality by using the plate method as a guide for meal planning to include lean protein/plant protein, fruits, vegetables, whole grains, nonfat dairy as part of a well-balanced diet.    Personal Goal #2 Patient to identify strategies for reducing cardiovascular risk by attending the [facility] education and nutrition series weekly.    Personal Goal #3 Patient to limit sodium to 1500mg  per day    Comments Goals in progress. Jameya continues to attend the [facility] education and nutrition series regularly. Abrea recently stopped smoking. She reports increased fiber intake from vegetables and increased water intake. She reports eating 1-2 meals per day and reports regular intake of cranberry juice throughout the day. She is down 4.6# since starting with our program. She will continue to benefit from participation in intensive cardiac rehab for nutrition, exercise, and lfiestyle modification.      Intervention Plan   Intervention Prescribe, educate and counsel regarding individualized specific dietary modifications aiming towards targeted core components such as weight, hypertension, lipid management, diabetes, heart failure and other comorbidities.;Nutrition handout(s) given to patient.    Expected Outcomes Short Term Goal: Understand basic principles of dietary content, such as calories, fat, sodium, cholesterol and nutrients.;Long Term Goal: Adherence to prescribed nutrition plan.             Nutrition Assessments:  MEDIFICTS Score Key: ?70 Need to make dietary changes  40-70 Heart Healthy Diet ? 40 Therapeutic Level Cholesterol Diet    Picture Your Plate  Scores: <16 Unhealthy dietary pattern with much room for improvement. 41-50 Dietary pattern unlikely to meet recommendations for good health and room for improvement. 51-60 More healthful dietary pattern, with some room for improvement.  >60 Healthy dietary pattern, although there may be some specific behaviors that could be improved.    Nutrition Goals Re-Evaluation:  Nutrition Goals Re-Evaluation     Row Name 07/25/22 0837 08/23/22 1543 09/20/22 1317         Goals   Current Weight 154 lb 15.7 oz (70.3 kg) 153 lb (69.4 kg) 150 lb 5.7 oz (68.2 kg)     Comment lipoprotein A 210.6, Lipids WNL -HDL 36, history of iron deficiency and B12 deficiency. B12 is very low normal No new labs; most recent labs lipoprotein A 210.6, Lipids WNL -HDL 36, history of iron deficiency and B12 deficiency. B12 is very low normal No new labs; most recent labs lipoprotein A 210.6, Lipids WNL -HDL 36, history of iron deficiency and B12 deficiency. B12 is very low normal, she continues B12 supplementation.     Expected Outcome Karen [name] recently stopped smoking. She will continue to benefit from participation in intensive cardiac rehab for nutrition, exercise, and lfiestyle modification. Goals in progress. Karen [name] continues to attend the [facility] education and nutrition series regularly. Karen [name] recently stopped smoking. She will continue to benefit from participation in intensive cardiac rehab for nutrition, exercise, and lfiestyle modification.  Goals in progress. Karen [name] continues to attend the [facility] education and nutrition series regularly. Karen [name] recently stopped smoking. She reports increased fiber intake from vegetables and increased water intake. She reports eating 1-2 meals per day and reports regular intake of cranberry juice throughout the day. She is down 4.6# since starting with our program. She will continue to benefit from participation in intensive cardiac rehab for nutrition, exercise, and lfiestyle modification.               Nutrition Goals Re-Evaluation:  Nutrition Goals Re-Evaluation     Row Name 07/25/22 0837 08/23/22 1543 09/20/22 1317         Goals   Current Weight 154 lb 15.7 oz (70.3 kg) 153 lb (69.4 kg) 150 lb 5.7 oz (68.2 kg)     Comment lipoprotein A 210.6, Lipids WNL -HDL 36, history of iron deficiency and B12 deficiency. B12 is very low normal No new labs; most recent labs lipoprotein A 210.6, Lipids WNL -HDL 36, history of iron deficiency and B12 deficiency. B12 is very low normal No new labs; most recent labs lipoprotein A 210.6, Lipids WNL -HDL 36, history of iron deficiency and B12 deficiency. B12 is very low normal, she continues B12 supplementation.     Expected Outcome Karen [name] recently stopped smoking. She will continue to benefit from participation in intensive cardiac rehab for nutrition, exercise, and lfiestyle modification. Goals in progress. Karen [name] continues to attend the [facility] education and nutrition series regularly. Karen [name] recently stopped smoking. She will continue to benefit from participation in intensive cardiac rehab for nutrition, exercise, and lfiestyle modification. Goals in progress. Karen [name] continues to attend the [facility] education and nutrition series regularly. Karen [name] recently stopped smoking. She reports increased fiber intake from vegetables and increased water intake. She reports eating 1-2 meals per day and reports regular intake of cranberry juice throughout the day. She is down 4.6# since starting with our program. She will continue to benefit from participation in intensive cardiac rehab for nutrition, exercise, and lfiestyle modification.              Nutrition Goals Discharge (Final Nutrition Goals Re-Evaluation):  Nutrition Goals Re-Evaluation - 09/20/22 1317       Goals   Current Weight 150 lb 5.7 oz (68.2 kg)    Comment No new labs; most recent labs lipoprotein A 210.6, Lipids WNL -HDL 36, history of iron deficiency and B12 deficiency. B12  is very low normal, she continues B12 supplementation.    Expected Outcome Goals in progress. Karen [name] continues to attend the [facility] education and nutrition series regularly. Karen [name] recently stopped smoking. She reports increased fiber intake from vegetables and increased water intake. She reports eating 1-2 meals per day and reports regular intake of cranberry juice throughout the day. She is down 4.6# since starting with our program. She will continue to benefit from participation in intensive cardiac rehab for nutrition, exercise, and lfiestyle modification.             Psychosocial: Target Goals: Acknowledge presence or absence of significant depression and/or stress, maximize coping skills, provide positive support system. Participant is able to verbalize types and ability to use techniques and skills needed for reducing stress and depression.  Initial Review & Psychosocial Screening:  Initial Psych Review & Screening - 07/16/22 1349       Initial Review   Current issues with None Identified      Family Dynamics   Good Support System? Yes    Comments Pt has  her 2 sons and her granddaughter for support      Barriers   Psychosocial barriers to participate in program There are no identifiable barriers or psychosocial needs.      Screening Interventions   Interventions Encouraged to exercise             Quality of Life Scores:  Quality of Life - 09/19/22 1324       Quality of Life Scores   Health/Function Pre 24.32 %    Health/Function Post 25.75 %    Health/Function % Change 5.88 %    Socioeconomic Pre 29 %    Socioeconomic Post 28.21 %    Socioeconomic % Change  -2.72 %    Psych/Spiritual Pre 30 %    Psych/Spiritual Post 28.29 %    Psych/Spiritual % Change -5.7 %    Family Pre 28.8 %    Family Post 27.5 %    Family % Change -4.51 %    GLOBAL Pre 27.14 %    GLOBAL Post 27.06 %    GLOBAL % Change -0.29 %            Scores of 19 and below usually indicate  a poorer quality of life in these areas.  A difference of  2-3 points is a clinically meaningful difference.  A difference of 2-3 points in the total score of the Quality of Life Index has been associated with significant improvement in overall quality of life, self-image, physical symptoms, and general health in studies assessing change in quality of life.  PHQ-9: Review Flowsheet       07/16/2022  Depression screen PHQ 2/9  Decreased Interest 0  Down, Depressed, Hopeless 0  PHQ - 2 Score 0  Altered sleeping 0  Tired, decreased energy 0  Change in appetite 0  Feeling bad or failure about yourself  0  Trouble concentrating 0  Moving slowly or fidgety/restless 0  Suicidal thoughts 0  PHQ-9 Score 0  Difficult doing work/chores Not difficult at all   Interpretation of Total Score  Total Score Depression Severity:  1-4 = Minimal depression, 5-9 = Mild depression, 10-14 = Moderate depression, 15-19 = Moderately severe depression, 20-27 = Severe depression   Psychosocial Evaluation and Intervention:   Psychosocial Re-Evaluation:  Psychosocial Re-Evaluation     Row Name 07/24/22 1709 08/08/22 1647 09/03/22 1635 10/01/22 1517       Psychosocial Re-Evaluation   Current issues with None Identified None Identified None Identified None Identified    Comments -- Karen [name] said her grandaughter that brings her to exercise has cone back to [place] for [employer] duty. Karen [name] is now driving herself -- Karen [name] will complete intensive cardiac rehab on 10/04/22    Interventions Encouraged to attend Cardiac Rehabilitation for the exercise Encouraged to attend Cardiac Rehabilitation for the exercise Encouraged to attend Cardiac Rehabilitation for the exercise Encouraged to attend Cardiac Rehabilitation for the exercise    Continue Psychosocial Services  No Follow up required No Follow up required No Follow up required No Follow up required             Psychosocial Discharge (Final  Psychosocial Re-Evaluation):  Psychosocial Re-Evaluation - 10/01/22 1517       Psychosocial Re-Evaluation   Current issues with None Identified    Comments Karen [name] will complete intensive cardiac rehab on 10/04/22    Interventions Encouraged to attend Cardiac Rehabilitation for the exercise    Continue Psychosocial Services  No Follow up required  Vocational Rehabilitation: Provide vocational rehab assistance to qualifying candidates.   Vocational Rehab Evaluation & Intervention:  Vocational Rehab - 07/16/22 1350       Initial Vocational Rehab Evaluation & Intervention   Assessment shows need for Vocational Rehabilitation No   Pt is retired            Education: Education Goals: Education classes will be provided on a weekly basis, covering required topics. Participant will state understanding/return demonstration of topics presented.    Education     Row Name 07/24/22 1300     Education   Cardiac Education Topics Pritikin   Orthoptist   Educator Dietitian   Weekly Topic Tasty Appetizers and Snacks   Instruction Review Code 1- Verbalizes Understanding   Class Start Time 1150   Class Stop Time 1226   Class Time Calculation (min) 36 min    Row Name 07/26/22 1200     Education   Cardiac Education Topics Pritikin   Licensed conveyancer Nutrition   Instruction Review Code 1- Verbalizes Understanding   Class Start Time 1150   Class Stop Time 1226   Class Time Calculation (min) 36 min    Row Name 07/29/22 1300     Education   Cardiac Education Topics Pritikin   Select Workshops     Workshops   Educator Exercise Physiologist   Select Exercise   Exercise Workshop Exercise Basics: Diplomatic Services operational officer   Instruction Review Code 1- Verbalizes Understanding   Class Start Time 1150   Class Stop Time 1235   Class Time Calculation (min) 45 min    Row Name 08/02/22  1300     Education   Cardiac Education Topics Pritikin   Nurse, children's   Educator Dietitian   Nutrition Calorie Density   Instruction Review Code 1- Verbalizes Understanding   Class Start Time 1145   Class Stop Time 1225   Class Time Calculation (min) 40 min    Row Name 08/05/22 1400     Education   Cardiac Education Topics Pritikin   Select Core Videos     Core Videos   Educator Dietitian   Nutrition Nutrition Action Plan   Instruction Review Code 1- Verbalizes Understanding   Class Start Time 1145   Class Stop Time 1219   Class Time Calculation (min) 34 min    Row Name 08/07/22 1300     Education   Cardiac Education Topics Pritikin   Customer service manager   Weekly Topic Efficiency Cooking - Meals in a Snap   Instruction Review Code 1- Verbalizes Understanding   Class Start Time 1140   Class Stop Time 1220   Class Time Calculation (min) 40 min    Row Name 08/09/22 1400     Education   Cardiac Education Topics Pritikin   Psychologist, forensic Exercise Education   Exercise Education Move It!   Instruction Review Code 1- Verbalizes Understanding   Class Start Time 1150   Class Stop Time 1230   Class Time Calculation (min) 40 min    Row Name 08/16/22 1400     Education   Cardiac Education Topics Pritikin   Psychologist, sport and exercise  Core Videos   Educator Exercise Industrial/product designer Education   General Education Hypertension and Heart Disease   Instruction Review Code 1- Verbalizes Understanding   Class Start Time 1147   Class Stop Time 1220   Class Time Calculation (min) 33 min    Row Name 08/19/22 1300     Education   Cardiac Education Topics Pritikin   Select Core Videos     Core Videos   Educator Nurse   Select Exercise Education   Exercise Education Biomechanial Limitations   Instruction Review Code 1- Verbalizes  Understanding   Class Start Time 1156   Class Stop Time 1232   Class Time Calculation (min) 36 min    Row Name 08/21/22 1300     Education   Cardiac Education Topics Pritikin   Customer service manager   Weekly Topic Comforting Weekend Breakfasts   Instruction Review Code 1- Verbalizes Understanding   Class Start Time 1135   Class Stop Time 1223   Class Time Calculation (min) 48 min    Row Name 08/23/22 1400     Education   Cardiac Education Topics Pritikin   Licensed conveyancer Nutrition   Nutrition Dining Out - Part 1   Instruction Review Code 1- Verbalizes Understanding   Class Start Time 1145   Class Stop Time 1222   Class Time Calculation (min) 37 min    Row Name 08/26/22 1400     Education   Cardiac Education Topics Pritikin   Nurse, children's   Educator Dietitian   Select Nutrition   Nutrition Facts on Fat   Instruction Review Code 1- Verbalizes Understanding   Class Start Time 1145   Class Stop Time 1225   Class Time Calculation (min) 40 min    Row Name 08/28/22 1200     Education   Cardiac Education Topics Pritikin   Customer service manager   Weekly Topic Fast Evening Meals   Instruction Review Code 1- Verbalizes Understanding   Class Start Time 1140   Class Stop Time 1215   Class Time Calculation (min) 35 min    Row Name 08/30/22 1300     Education   Cardiac Education Topics Pritikin   Licensed conveyancer Nutrition   Nutrition Vitamins and Minerals   Instruction Review Code 1- Verbalizes Understanding   Class Start Time 1140   Class Stop Time 1225   Class Time Calculation (min) 45 min    Row Name 09/04/22 1400     Education   Cardiac Education Topics Pritikin   Holiday representative   Weekly Topic International Cuisine- Spotlight on the [facility] Zones   Instruction Review Code 1- Verbalizes Understanding   Class Start Time 1140   Class Stop Time 1212   Class Time Calculation (min) 32 min    Row Name 09/06/22 1400     Education   Cardiac Education Topics Pritikin   Psychologist, forensic Exercise Education  Exercise Education Improving Performance   Instruction Review Code 1- Verbalizes Understanding   Class Start Time 1145   Class Stop Time 1230   Class Time Calculation (min) 45 min    Row Name 09/11/22 1300     Education   Cardiac Education Topics Pritikin   Customer service manager   Weekly Topic Simple Sides and Sauces   Instruction Review Code 1- Verbalizes Understanding   Class Start Time 1140   Class Stop Time 1215   Class Time Calculation (min) 35 min    Row Name 09/13/22 1300     Education   Cardiac Education Topics Pritikin   Nurse, children's Exercise Physiologist   Select Psychosocial   Psychosocial How Our Thoughts Can Heal Our Hearts   Instruction Review Code 1- Verbalizes Understanding   Class Start Time 1150   Class Stop Time 1226   Class Time Calculation (min) 36 min    Row Name 09/16/22 1200     Education   Cardiac Education Topics Pritikin   Geographical information systems officer Psychosocial   Psychosocial Workshop From Head to Heart: The Power of a Healthy Outlook   Instruction Review Code 1- Verbalizes Understanding   Class Start Time 1153   Class Stop Time 1237   Class Time Calculation (min) 44 min    Row Name 09/18/22 1200     Education   Cardiac Education Topics Pritikin   Customer service manager   Weekly Topic Powerhouse Plant-Based Proteins   Instruction Review Code 1-  Verbalizes Understanding   Class Start Time 1135   Class Stop Time 1210   Class Time Calculation (min) 35 min    Row Name 09/25/22 1300     Education   Cardiac Education Topics Pritikin   Customer service manager   Weekly Topic Adding Flavor - Sodium-Free   Instruction Review Code 1- Verbalizes Understanding   Class Start Time 1138   Class Stop Time 1210   Class Time Calculation (min) 32 min    Row Name 09/30/22 1300     Education   Cardiac Education Topics Pritikin   Select Workshops     Workshops   Educator Exercise Physiologist   Select Exercise   Exercise Workshop Location manager and Fall Prevention   Instruction Review Code 1- Verbalizes Understanding   Class Start Time 1146   Class Stop Time 1226   Class Time Calculation (min) 40 min            Core Videos: Exercise    Move It!  Clinical staff conducted group or individual video education with verbal and written material and guidebook.  Patient learns the recommended Pritikin exercise program. Exercise with the goal of living a long, healthy life. Some of the health benefits of exercise include controlled diabetes, healthier blood pressure levels, improved cholesterol levels, improved heart and lung capacity, improved sleep, and better body composition. Everyone should speak with their doctor before starting or changing an exercise routine.  Biomechanical Limitations Clinical staff conducted group or individual video education with verbal and written material and guidebook.  Patient learns how biomechanical limitations can impact exercise and how we can mitigate and possibly overcome limitations to have an impactful  and balanced exercise routine.  Body Composition Clinical staff conducted group or individual video education with verbal and written material and guidebook.  Patient learns that body composition (ratio of muscle mass to fat mass) is a key component to  assessing overall fitness, rather than body weight alone. Increased fat mass, especially visceral belly fat, can put [place] at increased risk for metabolic syndrome, type 2 diabetes, heart disease, and even death. It is recommended to combine diet and exercise (cardiovascular and resistance training) to improve your body composition. Seek guidance from your physician and exercise physiologist before implementing an exercise routine.  Exercise Action Plan Clinical staff conducted group or individual video education with verbal and written material and guidebook.  Patient learns the recommended strategies to achieve and enjoy long-term exercise adherence, including variety, self-motivation, self-efficacy, and positive decision making. Benefits of exercise include fitness, good health, weight management, more energy, better sleep, less stress, and overall well-being.  Medical   Heart Disease Risk Reduction Clinical staff conducted group or individual video education with verbal and written material and guidebook.  Patient learns our heart is our most vital organ as it circulates oxygen, nutrients, white blood cells, and hormones throughout the entire body, and carries waste away. Data supports a plant-based eating plan like the Pritikin Program for its effectiveness in slowing progression of and reversing heart disease. The video provides a number of recommendations to address heart disease.   Metabolic Syndrome and Belly Fat  Clinical staff conducted group or individual video education with verbal and written material and guidebook.  Patient learns what metabolic syndrome is, how it leads to heart disease, and how one can reverse it and keep it from coming back. You have metabolic syndrome if you have 3 of the following 5 criteria: abdominal obesity, high blood pressure, high triglycerides, low HDL cholesterol, and high blood sugar.  Hypertension and Heart Disease Clinical staff conducted group or  individual video education with verbal and written material and guidebook.  Patient learns that high blood pressure, or hypertension, is very common in the [place]. Hypertension is largely due to excessive salt intake, but other important risk factors include being overweight, physical inactivity, drinking too much alcohol, smoking, and not eating enough potassium from fruits and vegetables. High blood pressure is a leading risk factor for heart attack, stroke, congestive heart failure, dementia, kidney failure, and premature death. Long-term effects of excessive salt intake include stiffening of the arteries and thickening of heart muscle and organ damage. Recommendations include ways to reduce hypertension and the risk of heart disease.  Diseases of Our Time - Focusing on Diabetes Clinical staff conducted group or individual video education with verbal and written material and guidebook.  Patient learns why the best way to stop diseases of our time is prevention, through food and other lifestyle changes. Medicine (such as prescription pills and surgeries) is often only a Band-Aid on the problem, not a long-term solution. Most common diseases of our time include obesity, type 2 diabetes, hypertension, heart disease, and cancer. The Pritikin Program is recommended and has been proven to help reduce, reverse, and/or prevent the damaging effects of metabolic syndrome.  Nutrition   Overview of the Pritikin Eating Plan  Clinical staff conducted group or individual video education with verbal and written material and guidebook.  Patient learns about the Pritikin Eating Plan for disease risk reduction. The Pritikin Eating Plan emphasizes a wide variety of unrefined, minimally-processed carbohydrates, like fruits, vegetables, whole grains, and legumes. Go, Caution,  and Stop food choices are explained. Plant-based and lean animal proteins are emphasized. Rationale provided for low sodium intake for blood  pressure control, low added sugars for blood sugar stabilization, and low added fats and oils for coronary artery disease risk reduction and weight management.  Calorie Density  Clinical staff conducted group or individual video education with verbal and written material and guidebook.  Patient learns about calorie density and how it impacts the Pritikin Eating Plan. Knowing the characteristics of the food you choose will help you decide whether those foods will lead to weight gain or weight loss, and whether you want to consume more or less of them. Weight loss is usually a side effect of the Pritikin Eating Plan because of its focus on low calorie-dense foods.  Label Reading  Clinical staff conducted group or individual video education with verbal and written material and guidebook.  Patient learns about the Pritikin recommended label reading guidelines and corresponding recommendations regarding calorie density, added sugars, sodium content, and whole grains.  Dining Out - Part 1  Clinical staff conducted group or individual video education with verbal and written material and guidebook.  Patient learns that restaurant meals can be sabotaging because they can be so high in calories, fat, sodium, and/or sugar. Patient learns recommended strategies on how to positively address this and avoid unhealthy pitfalls.  Facts on Fats  Clinical staff conducted group or individual video education with verbal and written material and guidebook.  Patient learns that lifestyle modifications can be just as effective, if not more so, as many medications for lowering your risk of heart disease. A Pritikin lifestyle can help to reduce your risk of inflammation and atherosclerosis (cholesterol build-up, or plaque, in the artery walls). Lifestyle interventions such as dietary choices and physical activity address the cause of atherosclerosis. A review of the types of fats and their impact on blood cholesterol levels,  along with dietary recommendations to reduce fat intake is also included.  Nutrition Action Plan  Clinical staff conducted group or individual video education with verbal and written material and guidebook.  Patient learns how to incorporate Pritikin recommendations into their lifestyle. Recommendations include planning and keeping personal health goals in mind as an important part of their success.  Healthy Mind-Set    Healthy Minds, Bodies, Hearts  Clinical staff conducted group or individual video education with verbal and written material and guidebook.  Patient learns how to identify when they are stressed. Video will discuss the impact of that stress, as well as the many benefits of stress management. Patient will also be introduced to stress management techniques. The way we think, act, and feel has an impact on our hearts.  How Our Thoughts Can Heal Our Hearts  Clinical staff conducted group or individual video education with verbal and written material and guidebook.  Patient learns that negative thoughts can cause depression and anxiety. This can result in negative lifestyle behavior and serious health problems. Cognitive behavioral therapy is an effective method to help control our thoughts in order to change and improve our emotional outlook.  Additional Videos:  Exercise    Improving Performance  Clinical staff conducted group or individual video education with verbal and written material and guidebook.  Patient learns to use a non-linear approach by alternating intensity levels and lengths of time spent exercising to help burn more calories and lose more body fat. Cardiovascular exercise helps improve heart health, metabolism, hormonal balance, blood sugar control, and recovery from fatigue. Resistance training  improves strength, endurance, balance, coordination, reaction time, metabolism, and muscle mass. Flexibility exercise improves circulation, posture, and balance. Seek  guidance from your physician and exercise physiologist before implementing an exercise routine and learn your capabilities and proper form for all exercise.  Introduction to Yoga  Clinical staff conducted group or individual video education with verbal and written material and guidebook.  Patient learns about yoga, a discipline of the coming together of mind, breath, and body. The benefits of yoga include improved flexibility, improved range of motion, better posture and core strength, increased lung function, weight loss, and positive self-image. Yoga's heart health benefits include lowered blood pressure, healthier heart rate, decreased cholesterol and triglyceride levels, improved immune function, and reduced stress. Seek guidance from your physician and exercise physiologist before implementing an exercise routine and learn your capabilities and proper form for all exercise.  Medical   Aging: Enhancing Your Quality of Life  Clinical staff conducted group or individual video education with verbal and written material and guidebook.  Patient learns key strategies and recommendations to stay in good physical health and enhance quality of life, such as prevention strategies, having an advocate, securing a Health Care Proxy and Power of Attorney, and keeping a list of medications and system for tracking them. It also discusses how to avoid risk for bone loss.  Biology of Weight Control  Clinical staff conducted group or individual video education with verbal and written material and guidebook.  Patient learns that weight gain occurs because we consume more calories than we burn (eating more, moving less). Even if your body weight is normal, you may have higher ratios of fat compared to muscle mass. Too much body fat puts you at increased risk for cardiovascular disease, heart attack, stroke, type 2 diabetes, and obesity-related cancers. In addition to exercise, following the Pritikin Eating Plan can help  reduce your risk.  Decoding Lab Results  Clinical staff conducted group or individual video education with verbal and written material and guidebook.  Patient learns that lab test reflects one measurement whose values change over time and are influenced by many factors, including medication, stress, sleep, exercise, food, hydration, pre-existing medical conditions, and more. It is recommended to use the knowledge from this video to become more involved with your lab results and evaluate your numbers to speak with your doctor.   Diseases of Our Time - Overview  Clinical staff conducted group or individual video education with verbal and written material and guidebook.  Patient learns that according to the CDC, 50% to 70% of chronic diseases (such as obesity, type 2 diabetes, elevated lipids, hypertension, and heart disease) are avoidable through lifestyle improvements including healthier food choices, listening to satiety cues, and increased physical activity.  Sleep Disorders Clinical staff conducted group or individual video education with verbal and written material and guidebook.  Patient learns how good quality and duration of sleep are important to overall health and well-being. Patient also learns about sleep disorders and how they impact health along with recommendations to address them, including discussing with a physician.  Nutrition  Dining Out - Part 2 Clinical staff conducted group or individual video education with verbal and written material and guidebook.  Patient learns how to plan ahead and communicate in order to maximize their dining experience in a healthy and nutritious manner. Included are recommended food choices based on the type of restaurant the patient is visiting.   Fueling a Banker conducted group or individual video education  with verbal and written material and guidebook.  There is a strong connection between our food choices and our health.  Diseases like obesity and type 2 diabetes are very prevalent and are in large-part due to lifestyle choices. The Pritikin Eating Plan provides plenty of food and hunger-curbing satisfaction. It is easy to follow, affordable, and helps reduce health risks.  Menu Workshop  Clinical staff conducted group or individual video education with verbal and written material and guidebook.  Patient learns that restaurant meals can sabotage health goals because they are often packed with calories, fat, sodium, and sugar. Recommendations include strategies to plan ahead and to communicate with the manager, chef, or server to help order a healthier meal.  Planning Your Eating Strategy  Clinical staff conducted group or individual video education with verbal and written material and guidebook.  Patient learns about the Pritikin Eating Plan and its benefit of reducing the risk of disease. The Pritikin Eating Plan does not focus on calories. Instead, it emphasizes high-quality, nutrient-rich foods. By knowing the characteristics of the foods, we choose, we can determine their calorie density and make informed decisions.  Targeting Your Nutrition Priorities  Clinical staff conducted group or individual video education with verbal and written material and guidebook.  Patient learns that lifestyle habits have a tremendous impact on disease risk and progression. This video provides eating and physical activity recommendations based on your personal health goals, such as reducing LDL cholesterol, losing weight, preventing or controlling type 2 diabetes, and reducing high blood pressure.  Vitamins and Minerals  Clinical staff conducted group or individual video education with verbal and written material and guidebook.  Patient learns different ways to obtain key vitamins and minerals, including through a recommended healthy diet. It is important to discuss all supplements you take with your doctor.   Healthy Mind-Set     Smoking Cessation  Clinical staff conducted group or individual video education with verbal and written material and guidebook.  Patient learns that cigarette smoking and tobacco addiction pose a serious health risk which affects millions of people. Stopping smoking will significantly reduce the risk of heart disease, lung disease, and many forms of cancer. Recommended strategies for quitting are covered, including working with your doctor to develop a successful plan.  Culinary   Becoming a Set designer conducted group or individual video education with verbal and written material and guidebook.  Patient learns that cooking at home can be healthy, cost-effective, quick, and puts them in control. Keys to cooking healthy recipes will include looking at your recipe, assessing your equipment needs, planning ahead, making it simple, choosing cost-effective seasonal ingredients, and limiting the use of added fats, salts, and sugars.  Cooking - Breakfast and Snacks  Clinical staff conducted group or individual video education with verbal and written material and guidebook.  Patient learns how important breakfast is to satiety and nutrition through the entire day. Recommendations include key foods to eat during breakfast to help stabilize blood sugar levels and to prevent overeating at meals later in the day. Planning ahead is also a key component.  Cooking - Educational psychologist conducted group or individual video education with verbal and written material and guidebook.  Patient learns eating strategies to improve overall health, including an approach to cook more at home. Recommendations include thinking of animal protein as a side on your plate rather than center stage and focusing instead on lower calorie dense options like vegetables, fruits, whole grains,  and plant-based proteins, such as beans. Making sauces in large quantities to freeze for later and leaving the skin  on your vegetables are also recommended to maximize your experience.  Cooking - Healthy Salads and Dressing Clinical staff conducted group or individual video education with verbal and written material and guidebook.  Patient learns that vegetables, fruits, whole grains, and legumes are the foundations of the Pritikin Eating Plan. Recommendations include how to incorporate each of these in flavorful and healthy salads, and how to create homemade salad dressings. Proper handling of ingredients is also covered. Cooking - Soups and [employer] - Soups and Desserts Clinical staff conducted group or individual video education with verbal and written material and guidebook.  Patient learns that Pritikin soups and desserts make for easy, nutritious, and delicious snacks and meal components that are low in sodium, fat, sugar, and calorie density, while high in vitamins, minerals, and filling fiber. Recommendations include simple and healthy ideas for soups and desserts.   Overview     The Pritikin Solution Program Overview Clinical staff conducted group or individual video education with verbal and written material and guidebook.  Patient learns that the results of the Pritikin Program have been documented in more than 100 articles published in peer-reviewed journals, and the benefits include reducing risk factors for (and, in some cases, even reversing) high cholesterol, high blood pressure, type 2 diabetes, obesity, and more! An overview of the three key pillars of the Pritikin Program will be covered: eating well, doing regular exercise, and having a healthy mind-set.  WORKSHOPS  Exercise: Exercise Basics: Building Your Action Plan Clinical staff led group instruction and group discussion with PowerPoint presentation and patient guidebook. To enhance the learning environment the use of posters, models and videos may be added. At the conclusion of this workshop, patients will comprehend the  difference between physical activity and exercise, as well as the benefits of incorporating both, into their routine. Patients will understand the FITT (Frequency, Intensity, Time, and Type) principle and how to use it to build an exercise action plan. In addition, safety concerns and other considerations for exercise and cardiac rehab will be addressed by the presenter. The purpose of this lesson is to promote a comprehensive and effective weekly exercise routine in order to improve patients' overall level of fitness.   Managing Heart Disease: Your Path to a Healthier Heart Clinical staff led group instruction and group discussion with PowerPoint presentation and patient guidebook. To enhance the learning environment the use of posters, models and videos may be added.At the conclusion of this workshop, patients will understand the anatomy and physiology of the heart. Additionally, they will understand how Pritikin's three pillars impact the risk factors, the progression, and the management of heart disease.  The purpose of this lesson is to provide a high-level overview of the heart, heart disease, and how the Pritikin lifestyle positively impacts risk factors.  Exercise Biomechanics Clinical staff led group instruction and group discussion with PowerPoint presentation and patient guidebook. To enhance the learning environment the use of posters, models and videos may be added. Patients will learn how the structural parts of their bodies function and how these functions impact their daily activities, movement, and exercise. Patients will learn how to promote a neutral spine, learn how to manage pain, and identify ways to improve their physical movement in order to promote healthy living. The purpose of this lesson is to expose patients to common physical limitations that impact physical activity. Participants will  learn practical ways to adapt and manage aches and pains, and to minimize their  effect on regular exercise. Patients will learn how to maintain good posture while sitting, walking, and lifting.  Balance Training and Fall Prevention  Clinical staff led group instruction and group discussion with PowerPoint presentation and patient guidebook. To enhance the learning environment the use of posters, models and videos may be added. At the conclusion of this workshop, patients will understand the importance of their sensorimotor skills (vision, proprioception, and the vestibular system) in maintaining their ability to balance as they age. Patients will apply a variety of balancing exercises that are appropriate for their current level of function. Patients will understand the common causes for poor balance, possible solutions to these problems, and ways to modify their physical environment in order to minimize their fall risk. The purpose of this lesson is to teach patients about the importance of maintaining balance as they age and ways to minimize their risk of falling.  WORKSHOPS   Nutrition:  Fueling a Ship broker led group instruction and group discussion with PowerPoint presentation and patient guidebook. To enhance the learning environment the use of posters, models and videos may be added. Patients will review the foundational principles of the Pritikin Eating Plan and understand what constitutes a serving size in each of the food groups. Patients will also learn Pritikin-friendly foods that are better choices when away from home and review make-ahead meal and snack options. Calorie density will be reviewed and applied to three nutrition priorities: weight maintenance, weight loss, and weight gain. The purpose of this lesson is to reinforce (in a group setting) the key concepts around what patients are recommended to eat and how to apply these guidelines when away from home by planning and selecting Pritikin-friendly options. Patients will understand how calorie  density may be adjusted for different weight management goals.  Mindful Eating  Clinical staff led group instruction and group discussion with PowerPoint presentation and patient guidebook. To enhance the learning environment the use of posters, models and videos may be added. Patients will briefly review the concepts of the Pritikin Eating Plan and the importance of low-calorie dense foods. The concept of mindful eating will be introduced as well as the importance of paying attention to internal hunger signals. Triggers for non-hunger eating and techniques for dealing with triggers will be explored. The purpose of this lesson is to provide patients with the opportunity to review the basic principles of the Pritikin Eating Plan, discuss the value of eating mindfully and how to measure internal cues of hunger and fullness using the Hunger Scale. Patients will also discuss reasons for non-hunger eating and learn strategies to use for controlling emotional eating.  Targeting Your Nutrition Priorities Clinical staff led group instruction and group discussion with PowerPoint presentation and patient guidebook. To enhance the learning environment the use of posters, models and videos may be added. Patients will learn how to determine their genetic susceptibility to disease by reviewing their family history. Patients will gain insight into the importance of diet as part of an overall healthy lifestyle in mitigating the impact of genetics and other environmental insults. The purpose of this lesson is to provide patients with the opportunity to assess their personal nutrition priorities by looking at their family history, their own health history and current risk factors. Patients will also be able to discuss ways of prioritizing and modifying the Pritikin Eating Plan for their highest risk areas  Menu  Clinical  staff led group instruction and group discussion with PowerPoint presentation and patient guidebook. To  enhance the learning environment the use of posters, models and videos may be added. Using menus brought in from [employer], or printed from [employer], patients will apply the Pritikin dining out guidelines that were presented in the [employer] video. Patients will also be able to practice these guidelines in a variety of provided scenarios. The purpose of this lesson is to provide patients with the opportunity to practice hands-on learning of the Pritikin Dining Out guidelines with actual menus and practice scenarios.  Label Reading Clinical staff led group instruction and group discussion with PowerPoint presentation and patient guidebook. To enhance the learning environment the use of posters, models and videos may be added. Patients will review and discuss the Pritikin label reading guidelines presented in Pritikin's Label Reading Educational series video. Using fool labels brought in from local grocery stores and markets, patients will apply the label reading guidelines and determine if the packaged food meet the Pritikin guidelines. The purpose of this lesson is to provide patients with the opportunity to review, discuss, and practice hands-on learning of the Pritikin Label Reading guidelines with actual packaged food labels. Cooking School  Pritikin's [employer] are designed to teach patients ways to prepare quick, simple, and affordable recipes at home. The importance of nutrition's role in chronic disease risk reduction is reflected in its emphasis in the overall Pritikin program. By learning how to prepare essential core Pritikin Eating Plan recipes, patients will increase control over what they eat; be able to customize the flavor of foods without the use of added salt, sugar, or fat; and improve the quality of the food they consume. By learning a set of core recipes which are easily assembled, quickly prepared, and affordable, patients are more likely to  prepare more healthy foods at home. These workshops focus on convenient breakfasts, simple entres, side dishes, and desserts which can be prepared with minimal effort and are consistent with nutrition recommendations for cardiovascular risk reduction. Cooking [employer] are taught by a Armed forces logistics/support/administrative officer (RD) who has been trained by the [employer]. The chef or RD has a clear understanding of the importance of minimizing - if not completely eliminating - added fat, sugar, and sodium in recipes. Throughout the series of Cooking School Workshop sessions, patients will learn about healthy ingredients and efficient methods of cooking to build confidence in their capability to prepare    Cooking School weekly topics:  Adding Flavor- Sodium-Free  Fast and Healthy Breakfasts  Powerhouse Plant-Based Proteins  Satisfying Salads and Dressings  Simple Sides and Sauces  International Cuisine-Spotlight on the [employer] Zones  Delicious Desserts  Savory Soups  [employer] - Meals in a Astronomer Appetizers and Snacks  Comforting Weekend Breakfasts  One-Pot Wonders   Fast Evening Meals  Landscape architect Your Pritikin Plate  WORKSHOPS   Healthy Mindset (Psychosocial):  Focused Goals, Sustainable Changes Clinical staff led group instruction and group discussion with PowerPoint presentation and patient guidebook. To enhance the learning environment the use of posters, models and videos may be added. Patients will be able to apply effective goal setting strategies to establish at least one personal goal, and then take consistent, meaningful action toward that goal. They will learn to identify common barriers to achieving personal goals and develop strategies to overcome them. Patients will also gain an understanding of how our mind-set can impact  our ability to achieve goals and the importance of cultivating a positive and growth-oriented mind-set. The purpose  of this lesson is to provide patients with a deeper understanding of how to set and achieve personal goals, as well as the tools and strategies needed to overcome common obstacles which may arise along the way.  From Head to Heart: The Power of a Healthy Outlook  Clinical staff led group instruction and group discussion with PowerPoint presentation and patient guidebook. To enhance the learning environment the use of posters, models and videos may be added. Patients will be able to recognize and describe the impact of emotions and mood on physical health. They will discover the importance of self-care and explore self-care practices which may work for them. Patients will also learn how to utilize the 4 C's to cultivate a healthier outlook and better manage stress and challenges. The purpose of this lesson is to demonstrate to patients how a healthy outlook is an essential part of maintaining good health, especially as they continue their cardiac rehab journey.  Healthy Sleep for a Healthy Heart Clinical staff led group instruction and group discussion with PowerPoint presentation and patient guidebook. To enhance the learning environment the use of posters, models and videos may be added. At the conclusion of this workshop, patients will be able to demonstrate knowledge of the importance of sleep to overall health, well-being, and quality of life. They will understand the symptoms of, and treatments for, common sleep disorders. Patients will also be able to identify daytime and nighttime behaviors which impact sleep, and they will be able to apply these tools to help manage sleep-related challenges. The purpose of this lesson is to provide patients with a general overview of sleep and outline the importance of quality sleep. Patients will learn about a few of the most common sleep disorders. Patients will also be introduced to the concept of "sleep hygiene," and discover ways to self-manage certain sleeping  problems through simple daily behavior changes. Finally, the workshop will motivate patients by clarifying the links between quality sleep and their goals of heart-healthy living.   Recognizing and Reducing Stress Clinical staff led group instruction and group discussion with PowerPoint presentation and patient guidebook. To enhance the learning environment the use of posters, models and videos may be added. At the conclusion of this workshop, patients will be able to understand the types of stress reactions, differentiate between acute and chronic stress, and recognize the impact that chronic stress has on their health. They will also be able to apply different coping mechanisms, such as reframing negative self-talk. Patients will have the opportunity to practice a variety of stress management techniques, such as deep abdominal breathing, progressive muscle relaxation, and/or guided imagery.  The purpose of this lesson is to educate patients on the role of stress in their lives and to provide healthy techniques for coping with it.  Learning Barriers/Preferences:  Learning Barriers/Preferences - 07/16/22 1352       Learning Barriers/Preferences   Learning Barriers Sight;Hearing    Learning Preferences Pictoral;Computer/Internet;Video             Education Topics:  Knowledge Questionnaire Score:  Knowledge Questionnaire Score - 09/19/22 1323       Knowledge Questionnaire Score   Post Score 22/28             Core Components/Risk Factors/Patient Goals at Admission:  Personal Goals and Risk Factors at Admission - 07/16/22 1402       Core Components/Risk Factors/Patient  Goals on Admission   Tobacco Cessation --    Number of packs per day --    Intervention --    Expected Outcomes --             Core Components/Risk Factors/Patient Goals Review:   Goals and Risk Factor Review     Row Name 07/24/22 1711 08/08/22 1649 09/03/22 1636 10/01/22 1518       Core  Components/Risk Factors/Patient Goals Review   Personal Goals Review Tobacco Cessation;Hypertension;Lipids Tobacco Cessation;Hypertension;Lipids Tobacco Cessation;Hypertension;Lipids Tobacco Cessation;Hypertension;Lipids    Review Karen [name] started intensive cardiac rehab on 07/24/22. Karen [name] did well with exercise for her fitness level as Karen [name] is deconditoned. Vital signs were stable Karen [name] is doing well with exercise at  intensive cardiac rehab on 07/24/22.  Vital signs have been  stable. Karen [name] is enjoying participating in the program and says she feels stronger Karen [name] continues to do well with exercise at  intensive cardiac rehab   Vital signs remain  stable. Karen [name] is enjoying participating in the program and says she feels stronger. Karen [name] has lost 1.8 kg, Karen [name] continues to do well with exercise at  intensive cardiac rehab   Vital signs remain  stable. Karen [name] is enjoying participating in the program and says she feels stronger. Karen [name] has lost 1.5 kg. Karen [name] will complete intensive cardiac rehab on 10/04/22    Expected Outcomes Karen [name] will continue to participate in intensive cardiac rehab for exercise nutrition and lifestyle modifications Karen [name] will continue to participate in intensive cardiac rehab for exercise nutrition and lifestyle modifications Karen [name] will continue to participate in intensive cardiac rehab for exercise nutrition and lifestyle modifications Karen [name] will continue to participate in intensive cardiac rehab for exercise nutrition and lifestyle modifications             Core Components/Risk Factors/Patient Goals at Discharge (Final Review):   Goals and Risk Factor Review - 10/01/22 1518       Core Components/Risk Factors/Patient Goals Review   Personal Goals Review Tobacco Cessation;Hypertension;Lipids    Review Karen [name] continues to do well with exercise at  intensive cardiac rehab   Vital signs remain  stable. Karen [name] is enjoying participating in the program and  says she feels stronger. Karen [name] has lost 1.5 kg. Karen [name] will complete intensive cardiac rehab on 10/04/22    Expected Outcomes Karen [name] will continue to participate in intensive cardiac rehab for exercise nutrition and lifestyle modifications             ITP Comments:  ITP Comments     Row Name 07/16/22 1110 07/24/22 1659 08/08/22 1646 09/03/22 1634 10/01/22 1516   ITP Comments Armanda Magic, MD - Medical Director.  Introduction to the [employer] / Intensive Cardiac Rehab. Initial orientation packet reviewed with the patient. 30 Day ITP Review. Karen [name] started intensive cardiac rehab on 07/24/22 and did well for her fitness level 30 Day ITP Review. Karen [name] has good attendance and participation in intensive cardiac rehab 30 Day ITP Review. Karen [name] continues to have good attendance and participation in intensive cardiac rehab. Karen [name] is enjoying coming to exercise 30 Day ITP Review. Karen [name] continues to have good attendance and participation in intensive cardiac rehab. Karen [name] is enjoying coming to exercise. Karen [name] will complete intensive cardiac rehab on 10/04/22            Comments: See ITP comments.

## 2022-10-02 ENCOUNTER — Encounter (HOSPITAL_COMMUNITY)
Admission: RE | Admit: 2022-10-02 | Discharge: 2022-10-02 | Disposition: A | Discharge status: Home / Self Care | Payer: 59 | Source: Ambulatory Visit | Attending: Interventional Cardiology | Admitting: Interventional Cardiology

## 2022-10-02 VITALS — Ht 63.0 in | Wt 150.6 lb

## 2022-10-02 DIAGNOSIS — I2111 ST elevation (STEMI) myocardial infarction involving right coronary artery: Secondary | ICD-10-CM

## 2022-10-02 DIAGNOSIS — Z955 Presence of coronary angioplasty implant and graft: Secondary | ICD-10-CM

## 2022-10-02 DIAGNOSIS — Z9861 Coronary angioplasty status: Secondary | ICD-10-CM

## 2022-10-02 NOTE — Progress Notes (Signed)
Discharge Progress Report  Patient Details  Name: KYLENE WIKSTROM MRN: [id number] Date of Birth: 07-Feb-1946 Referring Provider:   Flowsheet Row INTENSIVE CARDIAC REHAB ORIENT from 07/16/2022 in [facility]  Referring Provider Lance Muss, MD        Number of Visits: 71  Reason for Discharge:  Patient reached a stable level of exercise. Patient has met program and personal goals.  Smoking History:  Social History   Tobacco Use  Smoking Status Former   Packs/day: 0.50   Years: 54.00   Additional pack years: 0.00   Total pack years: 27.00   Types: Cigarettes   Start date: 1968   Quit date: 06/05/2022   Years since quitting: 0.3  Smokeless Tobacco Never  Tobacco Comments   Smokes 1 cigarette every once in a while [facility] 08/22/22    Diagnosis:  05/08/22 S/P PTCA (percutaneous transluminal coronary angioplasty)  05/08/22 ST elevation myocardial infarction  10/13 /23 S/P coronary artery stent placement x 3 RCA Graft  ADL [facility]:   Initial Exercise Prescription:  Initial Exercise Prescription - 07/16/22 1300       Date of Initial Exercise RX and Referring Provider   Date 07/16/22    Referring Provider Lance Muss, MD    Expected Discharge Date 09/27/22      NuStep   Level 1    SPM 65    Minutes 25    METs 1.8      Prescription Details   Frequency (times per week) 3    Duration Progress to 30 minutes of continuous aerobic without signs/symptoms of physical distress      Intensity   THRR 40-80% of Max Heartrate 58-115    Ratings of Perceived Exertion 11-13    Perceived Dyspnea 0-4      Progression   Progression Continue progressive overload as per policy without signs/symptoms or physical distress.      Resistance Training   Training Prescription Yes    Weight 2 lbs    Reps 10-15             Discharge Exercise Prescription (Final Exercise Prescription Changes):  Exercise Prescription  Changes - 10/04/22 1400       Response to Exercise   Blood Pressure (Admit) 100/60    Blood Pressure (Exercise) 100/56    Blood Pressure (Exit) 110/70    Heart Rate (Admit) 69 bpm    Heart Rate (Exercise) 89 bpm    Heart Rate (Exit) 77 bpm    Rating of Perceived Exertion (Exercise) 11    Symptoms None    Comments Pt graduated from the cRP2 program today    Duration Continue with 30 min of aerobic exercise without signs/symptoms of physical distress.    Intensity THRR unchanged      Progression   Progression Continue to progress workloads to maintain intensity without signs/symptoms of physical distress.    Average METs 2.1      Resistance Training   Training Prescription Yes    Weight 2 lbs    Reps 10-15    Time 10 Minutes      Interval Training   Interval Training No      NuStep   Level 2    SPM 78    Minutes 30    METs 2.1      Home Exercise Plan   Plans to continue exercise at Home (comment)    Frequency Add 2 additional days to  program exercise sessions.    Initial Home Exercises Provided 09/18/22             Functional Capacity:  6 Minute Walk     Row Name 07/16/22 1354 10/02/22 1255       6 Minute Walk   Phase --  NUSTEP used Discharge  Nustep Test performed    Distance 1049 feet  NUSTEP test 1279 feet    Distance % Change -- 21.93 %    Distance Feet Change -- 230 ft    Walk Time 6 minutes 6 minutes    # of Rest Breaks 0 0    MPH 2 2.43    METS 1.8 2.33    RPE 11 11    Perceived Dyspnea  0 0    VO2 Peak 6.3 8.15    Symptoms No No    Resting HR 74 bpm 74 bpm    Resting BP 116/70 98/70    Resting Oxygen Saturation  100 % --    Exercise Oxygen Saturation  during 6 min walk 100 % --    Max Ex. HR 84 bpm 84 bpm    Max Ex. BP 114/66 128/70    2 Minute Post BP 110/60 --             Psychological, QOL, Others - Outcomes: PHQ 2/9:    10/02/2022    1:58 PM 07/16/2022    1:38 PM  Depression screen PHQ 2/9  Decreased Interest 0 0  Down,  Depressed, Hopeless 0 0  PHQ - 2 Score 0 0  Altered sleeping 0 0  Tired, decreased energy 1 0  Change in appetite 1 0  Feeling bad or failure about yourself  0 0  Trouble concentrating 0 0  Moving slowly or fidgety/restless 0 0  Suicidal thoughts 0 0  PHQ-9 Score 2 0  Difficult doing work/chores Not difficult at all Not difficult at all    Quality of Life:  Quality of Life - 09/19/22 1324       Quality of Life Scores   Health/Function Pre 24.32 %    Health/Function Post 25.75 %    Health/Function % Change 5.88 %    Socioeconomic Pre 29 %    Socioeconomic Post 28.21 %    Socioeconomic % Change  -2.72 %    Psych/Spiritual Pre 30 %    Psych/Spiritual Post 28.29 %    Psych/Spiritual % Change -5.7 %    Family Pre 28.8 %    Family Post 27.5 %    Family % Change -4.51 %    GLOBAL Pre 27.14 %    GLOBAL Post 27.06 %    GLOBAL % Change -0.29 %             Personal Goals: Goals established at orientation with interventions provided to work toward goal.  Personal Goals and Risk Factors at Admission - 07/16/22 1402       Core Components/Risk Factors/Patient Goals on Admission   Tobacco Cessation --    Number of packs per day --    Intervention --    Expected Outcomes --              Personal Goals Discharge:  Goals and Risk Factor Review     Row Name 07/24/22 1711 08/08/22 1649 09/03/22 1636 10/01/22 1518       Core Components/Risk Factors/Patient Goals Review   Personal Goals Review Tobacco Cessation;Hypertension;Lipids Tobacco Cessation;Hypertension;Lipids Tobacco Cessation;Hypertension;Lipids Tobacco Cessation;Hypertension;Lipids  Review Glady's started intensive cardiac rehab on 07/24/22. Glady's did well with exercise for her fitness level as Glady's is deconditoned. Vital signs were stable Glady's is doing well with exercise at  intensive cardiac rehab on 07/24/22.  Vital signs have been  stable. Glady's is enjoying participating in the program and says she  feels stronger Glady's continues to do well with exercise at  intensive cardiac rehab   Vital signs remain  stable. Glady's is enjoying participating in the program and says she feels stronger. Arlyle has lost 1.8 kg, Glady's continues to do well with exercise at  intensive cardiac rehab   Vital signs remain  stable. Glady's is enjoying participating in the program and says she feels stronger. Phoua has lost 1.5 kg. Glady's will complete intensive cardiac rehab on 10/04/22    Expected Outcomes Glady's will continue to participate in intensive cardiac rehab for exercise nutrition and lifestyle modifications Glady's will continue to participate in intensive cardiac rehab for exercise nutrition and lifestyle modifications Glady's will continue to participate in intensive cardiac rehab for exercise nutrition and lifestyle modifications Glady's will continue to participate in intensive cardiac rehab for exercise nutrition and lifestyle modifications             Exercise Goals and Review:  Exercise Goals     Row Name 07/16/22 1356             Exercise Goals   Increase Physical Activity Yes       Intervention Provide advice, education, support and counseling about physical activity/exercise needs.;Develop an individualized exercise prescription for aerobic and resistive training based on initial evaluation findings, risk stratification, comorbidities and participant's personal goals.       Expected Outcomes Long Term: Exercising regularly at least 3-5 days a week.;Long Term: Add in home exercise to make exercise part of routine and to increase amount of physical activity.;Short Term: Attend rehab on a regular basis to increase amount of physical activity.       Increase Strength and Stamina Yes       Intervention Provide advice, education, support and counseling about physical activity/exercise needs.;Develop an individualized exercise prescription for aerobic and resistive training based on initial  evaluation findings, risk stratification, comorbidities and participant's personal goals.       Expected Outcomes Long Term: Improve cardiorespiratory fitness, muscular endurance and strength as measured by increased METs and functional capacity ( );Short Term: Perform resistance training exercises routinely during rehab and add in resistance training at home;Short Term: Increase workloads from initial exercise prescription for resistance, speed, and METs.       Able to understand and use rate of perceived exertion (RPE) scale Yes       Intervention Provide education and explanation on how to use RPE scale       Expected Outcomes Short Term: Able to use RPE daily in rehab to express subjective intensity level;Long Term:  Able to use RPE to guide intensity level when exercising independently       Knowledge and understanding of Target Heart Rate Range (THRR) Yes       Intervention Provide education and explanation of THRR including how the numbers were predicted and where they are located for reference       Expected Outcomes Short Term: Able to state/look up THRR;Short Term: Able to use daily as guideline for intensity in rehab;Long Term: Able to use THRR to govern intensity when exercising independently       Understanding of Exercise Prescription Yes  Intervention Provide education, explanation, and written materials on patient's individual exercise prescription       Expected Outcomes Short Term: Able to explain program exercise prescription;Long Term: Able to explain home exercise prescription to exercise independently                Exercise Goals Re-Evaluation:  Exercise Goals Re-Evaluation     Row Name 07/24/22 1432 08/27/22 1501 09/18/22 1430 10/04/22 1446       Exercise Goal Re-Evaluation   Exercise Goals Review Increase Physical Activity;Increase Strength and Stamina;Able to understand and use rate of perceived exertion (RPE) scale;Knowledge and understanding of Target  Heart Rate Range (THRR);Understanding of Exercise Prescription Increase Physical Activity;Increase Strength and Stamina;Able to understand and use rate of perceived exertion (RPE) scale;Knowledge and understanding of Target Heart Rate Range (THRR);Understanding of Exercise Prescription Increase Physical Activity;Increase Strength and Stamina;Able to understand and use rate of perceived exertion (RPE) scale;Knowledge and understanding of Target Heart Rate Range (THRR);Understanding of Exercise Prescription Increase Physical Activity;Increase Strength and Stamina;Able to understand and use rate of perceived exertion (RPE) scale;Knowledge and understanding of Target Heart Rate Range (THRR);Understanding of Exercise Prescription    Comments Pt first day in program she reported 5/10 L shoulder pain on the NuStep, While exercising pt avg 1.8 Mets, will continue to educate pt on her ExRx, THRR, and RPE scale while increase her WL as tolerated Reviewed METs and goals. Pt had goals of improved mobility and leg strength. Pt voices she has not seen the progress she had hoped for. Will coninu to encourage increaseing time to 30 mintues for endurance. Then will focus on increasing workload if the patient can tolerate it. Reviewed METs and goals and home exercise Rx. Pt will try to start walking at home 2x week and work up to 30 minutes as able. Pt graduated from the CRP2 program today. Pt had peak METs of 2.2 and plans to continue her exercise by walking at home with her son.    Expected Outcomes Pt is motivated to reach her goals, will continue to educate and increase WL as tolerated. Will continue to monitor patient and progress workloads as tolerated. Will continue to monitor patient and progress workloads as tolerated. Pt will continue to exericse at home.             Nutrition & Weight - Outcomes:  Pre Biometrics - 07/16/22 1050       Pre Biometrics   Waist Circumference 37 inches    Hip Circumference 42  inches    Waist to Hip Ratio 0.88 %    Triceps Skinfold 25 mm    % Body Fat 39.7 %    Grip Strength 30 kg    Flexibility --   Not performed due to chronic back problems   Single Leg Stand --   Not performed: Pt uses cane/walk and has impaired balance            Post Biometrics - 10/02/22 1245        Post  Biometrics   Height 5\' 3"  (1.6 m)    Weight 68.3 kg    Waist Circumference 37 inches    Hip Circumference 41 inches    Waist to Hip Ratio 0.9 %    BMI (Calculated) 26.68    Triceps Skinfold 24 mm    % Body Fat 39.1 %    Grip Strength 28 kg    Flexibility --   Not performed due to back problems   Single  Leg Stand --   Not performed, pt uses cane/walker            Nutrition:  Nutrition Therapy & Goals - 09/20/22 1317       Nutrition Therapy   Diet Heart healthy diet    Drug/Food Interactions Statins/Certain Fruits      Personal Nutrition Goals   Nutrition Goal Patient to improve diet quality by using the plate method as a guide for meal planning to include lean protein/plant protein, fruits, vegetables, whole grains, nonfat dairy as part of a well-balanced diet.    Personal Goal #2 Patient to identify strategies for reducing cardiovascular risk by attending the [facility] education and nutrition series weekly.    Personal Goal #3 Patient to limit sodium to 1500mg  per day    Comments Goals in progress. Aella continues to attend the [facility] education and nutrition series regularly. Cherlynn recently stopped smoking. She reports increased fiber intake from vegetables and increased water intake. She reports eating 1-2 meals per day and reports regular intake of cranberry juice throughout the day. She is down 4.6# since starting with our program. She will continue to benefit from participation in intensive cardiac rehab for nutrition, exercise, and lfiestyle modification.      Intervention Plan   Intervention Prescribe, educate and counsel regarding individualized specific  dietary modifications aiming towards targeted core components such as weight, hypertension, lipid management, diabetes, heart failure and other comorbidities.;Nutrition handout(s) given to patient.    Expected Outcomes Short Term Goal: Understand basic principles of dietary content, such as calories, fat, sodium, cholesterol and nutrients.;Long Term Goal: Adherence to prescribed nutrition plan.             Nutrition Discharge:  Nutrition Assessments - 10/07/22 0837       Rate Your Plate Scores   Post Score 72             Education Questionnaire Score:  Knowledge Questionnaire Score - 09/19/22 1323       Knowledge Questionnaire Score   Post Score 22/28             Goals reviewed with patient; copy given to patient.Pt graduates from  Intensive/Traditional cardiac rehab program on 10/04/22 with completion of  27  exercise and  25 education sessions. Pt maintained good attendance and progressed nicely during their participation in rehab as evidenced by increased MET level. Aaralynn increased her distance on her post exercise nustep  test by 230 feet and lost 2.8 kg.  Medication list reconciled. Repeat  PHQ score-  2.  Pt has made significant lifestyle changes and should be commended for their success. Shirel achieved their goals during cardiac rehab.   Pt plans to continue exercise at home by walking with her son. Zhamira says that participating in cardiac rehab has been helpful for her. We are proud of Glady's progress!Thayer Headings RN BSN

## 2022-10-04 ENCOUNTER — Encounter (HOSPITAL_COMMUNITY)
Admission: RE | Admit: 2022-10-04 | Discharge: 2022-10-04 | Disposition: A | Discharge status: Home / Self Care | Payer: 59 | Source: Ambulatory Visit | Attending: Interventional Cardiology | Admitting: Interventional Cardiology

## 2022-10-04 DIAGNOSIS — Z955 Presence of coronary angioplasty implant and graft: Secondary | ICD-10-CM

## 2022-10-04 DIAGNOSIS — I2111 ST elevation (STEMI) myocardial infarction involving right coronary artery: Secondary | ICD-10-CM

## 2022-10-04 DIAGNOSIS — Z9861 Coronary angioplasty status: Secondary | ICD-10-CM | POA: Diagnosis not present

## 2022-10-21 ENCOUNTER — Other Ambulatory Visit: Payer: Self-pay | Admitting: Interventional Cardiology

## 2022-12-31 ENCOUNTER — Telehealth: Payer: Self-pay | Admitting: Internal Medicine

## 2023-01-03 ENCOUNTER — Ambulatory Visit (HOSPITAL_COMMUNITY)
Admission: RE | Admit: 2023-01-03 | Discharge: 2023-01-03 | Disposition: A | Discharge status: Home / Self Care | Payer: 59 | Source: Ambulatory Visit | Attending: Internal Medicine | Admitting: Internal Medicine

## 2023-01-03 ENCOUNTER — Inpatient Hospital Stay: Payer: 59

## 2023-01-03 ENCOUNTER — Inpatient Hospital Stay: Payer: 59 | Attending: Internal Medicine

## 2023-01-03 DIAGNOSIS — C3490 Malignant neoplasm of unspecified part of unspecified bronchus or lung: Secondary | ICD-10-CM

## 2023-01-03 DIAGNOSIS — R918 Other nonspecific abnormal finding of lung field: Secondary | ICD-10-CM | POA: Insufficient documentation

## 2023-01-03 DIAGNOSIS — Z923 Personal history of irradiation: Secondary | ICD-10-CM | POA: Insufficient documentation

## 2023-01-03 DIAGNOSIS — Z85118 Personal history of other malignant neoplasm of bronchus and lung: Secondary | ICD-10-CM | POA: Insufficient documentation

## 2023-01-03 LAB — CBC WITH DIFFERENTIAL (CANCER CENTER ONLY)
Abs Immature Granulocytes: 0 10*3/uL (ref 0.00–0.07)
Basophils Absolute: 0 10*3/uL (ref 0.0–0.1)
Basophils Relative: 0 %
Eosinophils Absolute: 0.1 10*3/uL (ref 0.0–0.5)
Eosinophils Relative: 1 %
HCT: 39 % (ref 36.0–46.0)
Hemoglobin: 13.2 g/dL (ref 12.0–15.0)
Immature Granulocytes: 0 %
Lymphocytes Relative: 37 %
Lymphs Abs: 1.4 10*3/uL (ref 0.7–4.0)
MCH: 32.4 pg (ref 26.0–34.0)
MCHC: 33.8 g/dL (ref 30.0–36.0)
MCV: 95.6 fL (ref 80.0–100.0)
Monocytes Absolute: 0.2 10*3/uL (ref 0.1–1.0)
Monocytes Relative: 6 %
Neutro Abs: 2.1 10*3/uL (ref 1.7–7.7)
Neutrophils Relative %: 56 %
Platelet Count: 187 10*3/uL (ref 150–400)
RBC: 4.08 MIL/uL (ref 3.87–5.11)
RDW: 12.1 % (ref 11.5–15.5)
WBC Count: 3.7 10*3/uL — ABNORMAL LOW (ref 4.0–10.5)
nRBC: 0 % (ref 0.0–0.2)

## 2023-01-03 LAB — CMP (CANCER CENTER ONLY)
ALT: 11 U/L (ref 0–44)
AST: 14 U/L — ABNORMAL LOW (ref 15–41)
Albumin: 4.3 g/dL (ref 3.5–5.0)
Alkaline Phosphatase: 71 U/L (ref 38–126)
Anion gap: 5 (ref 5–15)
BUN: 13 mg/dL (ref 8–23)
CO2: 31 mmol/L (ref 22–32)
Calcium: 9.7 mg/dL (ref 8.9–10.3)
Chloride: 106 mmol/L (ref 98–111)
Creatinine: 1.1 mg/dL — ABNORMAL HIGH (ref 0.44–1.00)
GFR, Estimated: 52 mL/min — ABNORMAL LOW (ref >60)
Glucose, Bld: 115 mg/dL — ABNORMAL HIGH (ref 70–99)
Potassium: 3.6 mmol/L (ref 3.5–5.1)
Sodium: 142 mmol/L (ref 135–145)
Total Bilirubin: 0.6 mg/dL (ref 0.3–1.2)
Total Protein: 7 g/dL (ref 6.5–8.1)

## 2023-01-03 MED ORDER — SODIUM CHLORIDE (PF) 0.9 % IJ SOLN
INTRAMUSCULAR | Status: AC
  Filled 2023-01-03: qty 50

## 2023-01-03 MED ORDER — IOHEXOL 300 MG/ML  SOLN
75.0000 mL | Freq: Once | INTRAMUSCULAR | Status: AC | PRN
  Administered 2023-01-03: 75 mL via INTRAVENOUS

## 2023-01-07 ENCOUNTER — Ambulatory Visit: Payer: 59 | Admitting: Internal Medicine

## 2023-01-09 ENCOUNTER — Inpatient Hospital Stay (HOSPITAL_BASED_OUTPATIENT_CLINIC_OR_DEPARTMENT_OTHER): Payer: 59 | Admitting: Internal Medicine

## 2023-01-09 VITALS — BP 124/88 | HR 69 | Temp 98.3°F | Resp 17 | Ht 63.0 in | Wt 145.3 lb

## 2023-01-09 DIAGNOSIS — R918 Other nonspecific abnormal finding of lung field: Secondary | ICD-10-CM | POA: Diagnosis present

## 2023-01-09 DIAGNOSIS — C3490 Malignant neoplasm of unspecified part of unspecified bronchus or lung: Secondary | ICD-10-CM | POA: Diagnosis not present

## 2023-01-09 DIAGNOSIS — Z923 Personal history of irradiation: Secondary | ICD-10-CM | POA: Diagnosis not present

## 2023-01-09 DIAGNOSIS — Z85118 Personal history of other malignant neoplasm of bronchus and lung: Secondary | ICD-10-CM | POA: Diagnosis not present

## 2023-01-09 NOTE — Progress Notes (Signed)
[facility] [phone]   [phone]  OFFICE PROGRESS NOTE  [facility], [facility] [address] [place] [address]  DIAGNOSIS: Stage IB (T1b, N0, M0) non-small cell lung cancer, adenocarcinoma presented with left lower lobe lung nodule diagnosed in September 2022.   PRIOR THERAPY: SBRT under the care of Dr. Roselind Messier. Last treatment on 02/15/21.    CURRENT THERAPY: Observation   INTERVAL HISTORY: Karen [name] 77 y.o. female returns to the clinic today for follow-up visit.  The patient is feeling fine today with no concerning complaints except for the baseline shortness of breath.  She denied having any chest pain, cough or hemoptysis.  She has no nausea, vomiting, diarrhea or constipation.  The patient has no weight loss or night sweats.  The patient had repeat CT scan of the chest performed recently and she is here for evaluation and discussion of her scan results.  MEDICAL HISTORY: Past Medical History:  Diagnosis Date   Acute myocardial infarction, unspecified site, episode of care unspecified    Adenocarcinoma, 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     ALLERGIES:  is allergic to codeine and novocain [procaine].  MEDICATIONS:  Current Outpatient Medications  Medication Sig Dispense Refill   acetaminophen (TYLENOL) 500 MG tablet Take 500 mg by mouth every 6 (six) hours as needed for headache.     albuterol (VENTOLIN HFA) 108 (90 Base) MCG/ACT inhaler Inhale 2 puffs into the lungs every 6 (six) hours as needed for wheezing or shortness  of breath. 54 g 3   amLODipine (NORVASC) 5 MG tablet Take 1 tablet (5 mg total) by mouth daily. (Patient not taking: Reported on 08/22/2022) 90 tablet 3   clopidogrel (PLAVIX) 75 MG tablet Take 1 tablet (75 mg total) by mouth daily. 90 tablet 3   cyanocobalamin (VITAMIN B12) 1000 MCG tablet Take 1 tablet (1,000 mcg total) by mouth daily. 30 tablet 0   cyclobenzaprine (FLEXERIL) 10 MG tablet Take 10 mg by mouth 3 (three) times daily as needed for muscle spasms.     fluticasone (FLONASE) 50 MCG/ACT nasal spray Place 1 spray into both nostrils daily. (Patient taking differently: Place 1 spray into both nostrils daily as needed for allergies.) 18.2 mL 2   furosemide (LASIX) 40 MG tablet Take 1 tablet (40 mg total) by mouth daily. 90 tablet 3   isosorbide mononitrate (IMDUR) 60 MG 24 hr tablet Take 1 tablet (60 mg total) by mouth 2 (two) times daily. 180 tablet 3   lisinopril (ZESTRIL) 10 MG tablet Take 1 tablet (10 mg total) by mouth daily. 90 tablet 3   metoprolol tartrate (LOPRESSOR) 25 MG tablet Take 0.5 tablets (12.5 mg total) by mouth 2 (two) times daily. 90 tablet 3   naloxone (NARCAN) nasal spray 4 mg/0.1 mL SMARTSIG:Both Nares     nitroGLYCERIN (NITROSTAT) 0.4 MG SL tablet Place 1 tablet (0.4 mg total) under the tongue every 5 (five) minutes as needed for chest pain (X3 DOSES MAX). 25 tablet 11   oxyCODONE-acetaminophen (PERCOCET) 7.5-325 MG  tablet Take 1-2 tablets by mouth every 4 (four) hours as needed for moderate pain (back pain).     pantoprazole (PROTONIX) 40 MG tablet Take 1 tablet (40 mg total) by mouth daily. 30 tablet 0   polyethylene glycol (MIRALAX / GLYCOLAX) 17 g packet Take 17 g by mouth daily. 14 each 0   ranolazine (RANEXA) 500 MG 12 hr tablet Take 1 tablet (500 mg total) by mouth 2 (two) times daily. 180 tablet 3   rosuvastatin (CRESTOR) 20 MG tablet Take 1 tablet (20 mg total) by mouth daily. 90 tablet 3   No current facility-administered medications for this visit.     SURGICAL HISTORY:  Past Surgical History:  Procedure Laterality Date   CARDIAC CATHETERIZATION     CARDIAC DEFIBRILLATOR PLACEMENT  03/29/2006   [employer]. remote-yes.    CORONARY ARTERY BYPASS GRAFT     x4   CORONARY STENT INTERVENTION N/A 05/11/2022   Procedure: CORONARY STENT INTERVENTION;  Surgeon: Tonny Bollman, MD;  Location: [facility] INVASIVE CV LAB;  Service: Cardiovascular;  Laterality: N/A;   CORONARY/GRAFT ACUTE MI REVASCULARIZATION N/A 05/08/2022   Procedure: Coronary/Graft Acute MI Revascularization;  Surgeon: Lennette Bihari, MD;  Location: [facility] INVASIVE CV LAB;  Service: Cardiovascular;  Laterality: N/A;   IMPLANTABLE CARDIOVERTER DEFIBRILLATOR GENERATOR CHANGE N/A 03/03/2013   Procedure: IMPLANTABLE CARDIOVERTER DEFIBRILLATOR GENERATOR CHANGE;  Surgeon: [name] Salvia, MD;  Location: [facility] CATH LAB;  Service: Cardiovascular;  Laterality: N/A;   IR ANGIOGRAM VISCERAL SELECTIVE  05/06/2022   IR AORTAGRAM ABDOMINAL SERIALOGRAM  05/06/2022   IR EMBO ART  VEN HEMORR LYMPH EXTRAV  INC GUIDE ROADMAPPING  05/06/2022   IR US GUIDE VASC ACCESS RIGHT  05/06/2022   LEFT HEART CATH AND CORONARY ANGIOGRAPHY N/A 05/08/2022   Procedure: LEFT HEART CATH AND CORONARY ANGIOGRAPHY;  Surgeon: Lennette Bihari, MD;  Location: [facility] INVASIVE CV LAB;  Service: Cardiovascular;  Laterality: N/A;   LEFT HEART CATH AND CORS/GRAFTS ANGIOGRAPHY N/A 05/11/2022   Procedure: LEFT HEART CATH AND CORS/GRAFTS ANGIOGRAPHY;  Surgeon: Tonny Bollman, MD;  Location: [facility] INVASIVE CV LAB;  Service: Cardiovascular;  Laterality: N/A;   stent implant     x1. possibly x2.     REVIEW OF SYSTEMS:  Constitutional: positive for fatigue Eyes: negative Ears, nose, mouth, throat, and face: negative Respiratory: positive for dyspnea on exertion Cardiovascular: negative Gastrointestinal: negative Genitourinary:negative Integument/breast: negative Hematologic/lymphatic: negative Musculoskeletal:negative Neurological:  negative Behavioral/Psych: negative Endocrine: negative Allergic/Immunologic: negative   PHYSICAL EXAMINATION: General appearance: alert, cooperative, fatigued, and no distress Head: Normocephalic, without obvious abnormality, atraumatic Neck: no adenopathy, no JVD, supple, symmetrical, trachea midline, and thyroid not enlarged, symmetric, no tenderness/mass/nodules Lymph nodes: Cervical, supraclavicular, and axillary nodes normal. Resp: clear to auscultation bilaterally Back: symmetric, no curvature. ROM normal. No CVA tenderness. Cardio: regular rate and rhythm, S1, S2 normal, no murmur, click, rub or gallop GI: soft, non-tender; bowel sounds normal; no masses,  no organomegaly Extremities: extremities normal, atraumatic, no cyanosis or edema Neurologic: Alert and oriented X 3, normal strength and tone. Normal symmetric reflexes. Normal coordination and gait  ECOG PERFORMANCE STATUS: 1 - Symptomatic but completely ambulatory  Blood pressure 124/88, pulse 69, temperature 98.3 F (36.8 C), temperature source Oral, resp. rate 17, height 5\' 3"  (1.6 m), weight 145 lb 4.8 oz (65.9 kg), SpO2 100%.  LABORATORY DATA: Lab Results  Component Value Date   WBC 3.7 (L) 01/03/2023   HGB 13.2 01/03/2023   HCT 39.0 01/03/2023   MCV 95.6 01/03/2023  PLT 187 01/03/2023      Chemistry      Component Value Date/Time   NA 142 01/03/2023 1129   NA 142 06/06/2022 1423   K 3.6 01/03/2023 1129   CL 106 01/03/2023 1129   CO2 31 01/03/2023 1129   BUN 13 01/03/2023 1129   BUN 12 06/06/2022 1423   CREATININE 1.10 (H) 01/03/2023 1129      Component Value Date/Time   CALCIUM 9.7 01/03/2023 1129   ALKPHOS 71 01/03/2023 1129   AST 14 (L) 01/03/2023 1129   ALT 11 01/03/2023 1129   BILITOT 0.6 01/03/2023 1129       RADIOGRAPHIC STUDIES: CT Chest W Contrast  Result Date: 01/09/2023 CLINICAL DATA:  Non-small cell lung cancer (NSCLC), restaging. Completed radiation therapy. * Tracking Code: BO *  EXAM: CT CHEST WITH CONTRAST TECHNIQUE: Multidetector CT imaging of the chest was performed during intravenous contrast administration. RADIATION DOSE REDUCTION: This exam was performed according to the departmental dose-optimization program which includes automated exposure control, adjustment of the mA and/or kV according to patient size and/or use of iterative reconstruction technique. CONTRAST:  75mL OMNIPAQUE IOHEXOL 300 MG/ML  SOLN COMPARISON:  07/01/2022 chest CT FINDINGS: Cardiovascular: Normal heart size. No significant pericardial effusion/thickening. Three-vessel coronary atherosclerosis status post CABG. Single lead left subclavian ICD with lead tip in the right ventricular apex. Atherosclerotic nonaneurysmal thoracic aorta. Normal caliber pulmonary arteries. No central pulmonary emboli. Mediastinum/Nodes: No significant thyroid nodules. Unremarkable esophagus. No pathologically enlarged axillary, mediastinal or hilar lymph nodes. Lungs/Pleura: No pneumothorax. No pleural effusion. Masslike fibrosis in the superior segment left lower lobe measures 3.6 x 2.0 cm (series 4/image 50), previously 3.9 x 2.5 cm, mildly decreased, compatible with evolving postradiation change. Part solid 2.7 x 1.9 cm posterior right lower lobe pulmonary nodule with 1.2 cm solid component (series 4/image 70), previously 2.3 x 1.7 cm with 0.8 cm solid component, increased. No acute consolidative airspace disease or additional significant pulmonary nodules. Upper abdomen: Subcentimeter hypodense scattered right liver lesions are too small to characterize and unchanged. Subcentimeter hypervascular lateral segment left liver lesion is too small to characterize and unchanged. Left colonic diverticulosis. Musculoskeletal: No aggressive appearing focal osseous lesions. Mild thoracic spondylosis. Intact sternotomy wires. IMPRESSION: 1. Part solid 2.7 cm posterior right lower lobe pulmonary nodule with 1.2 cm solid component, increased in  size and solid component since 07/01/2022 chest CT, highly suspicious for primary bronchogenic adenocarcinoma. 2. Evolving postradiation change in the superior segment left lower lobe, with no evidence of local tumor recurrence. 3. No thoracic adenopathy or other findings of metastatic disease in the chest. 4.  Aortic Atherosclerosis (ICD10-I70.0). Electronically Signed   By: Delbert Phenix M.D.   On: 01/09/2023 08:41    ASSESSMENT AND PLAN: This is a very pleasant 77 years old African-American female with Stage IB (T1b, N0, M0) non-small cell lung cancer, adenocarcinoma presented with left lower lobe lung nodule diagnosed in September 2022.  She is status post SBRT under the care of Dr. Roselind Messier. Last treatment on 02/15/21.  Unfortunately her last CT scan showed bilateral pulmonary opacities suspicious for low-grade adenocarcinoma but inflammatory process could not be completely ruled out especially with her aspiration issues. Previous CT scan of the chest in October 2023 showed stable disease with stable bilateral pulmonary nodules that did not resolve in the interval but has been stable since the December 06, 2021 scan. A follow-up PET scan showed mildly increased FDG uptake corresponding to the treated lesion within the superior segment  of the left lower lobe.  These are secondary to radiation changes.  She also had persistent nonsolid nodule within the posterior right upper lobe with SUV of 3.0 that slightly larger and more hypermetabolic than the previous imaging studies and low-grade malignancy could not be excluded. The patient had repeat CT scan of the chest performed recently.  I personally and independently reviewed the scan images and discussed the results with the patient today. Her scan showed the part solid 2.7 cm posterior right lower lobe pulmonary nodule had increased in size and solid component compared to the previous study.  This nodule is still highly suspicious for primary bronchogenic  adenocarcinoma. I recommended for the patient to see Dr. Roselind Messier for consideration of SBRT to this nodule. I will see her back for follow-up visit in 4 months with repeat CT scan of the chest for restaging of her disease. She was advised to call immediately if she has any other concerning symptoms in the interval. The patient voices understanding of current disease status and treatment options and is in agreement with the current care plan.  All questions were answered. The patient knows to call the clinic with any problems, questions or concerns. We can certainly see the patient much sooner if necessary. The total time spent in the appointment was 30 minutes.  Disclaimer: This note was dictated with voice recognition software. Similar sounding words can inadvertently be transcribed and may not be corrected upon review.

## 2023-01-10 ENCOUNTER — Telehealth: Payer: Self-pay | Admitting: Radiation Oncology

## 2023-01-10 NOTE — Telephone Encounter (Signed)
9/13 @ 9:31 am called both patient contact numbers voicemail is full and/or not yet activated.  Will tried again later.

## 2023-01-12 NOTE — Progress Notes (Unsigned)
Cardiology Office Note   Date:  01/13/2023   ID:  Karen [name], DOB [birth year], MRN [id number]  PCP:  [facility]    No chief complaint on file.  CAD  Wt Readings from Last 3 Encounters:  01/13/23 147 lb 9.6 oz (67 kg)  01/09/23 145 lb 4.8 oz (65.9 kg)  10/02/22 150 lb 9.2 oz (68.3 kg)       History of Present Illness: Karen [name] is a 77 y.o. female  who has had a complicated cardiac history. She had a cardiac arrest in the early 04-04-99. At that time, she had an AICD placed in [place] by Dr. Bernette Mayers. In 2006-04-03, she had another cardiac arrest at the time of hospitalization. Cardiac cath showed an occluded left main which was angioplastied. She then went to emergency CABG. she recovered from that episode.    Since that time, she has had chronic chest discomfort. His lead to a repeat cardiac cath in April 04, 2007. It showed that her grafts were closed. Her native left main had opened. She did have some other branch vessel disease. She has been managed medically. SHe has had some chest pain since stopping the Ranexa which was no longer covered by insurance, but improved with Imdur.  Last stress test was in July 2015 and showed no evidence of ischemia with normal LV function.   Husband passed away in 03-Apr-2018.   She has chronic back pain.      Diagnosed with lung cancer in 2021/04/03, followed by Dr. Shirline Frees: "Stage IB (T1b, N0, M0) non-small cell lung cancer, adenocarcinoma presented with left lower lobe lung nodule diagnosed in September 2022.  Lower GI bleed in Jan 2024. She got a blood transfusion.  Colonoscopy pending.  No further bleeding.   Transient ST elevation at that time led to cath.    Cath in Jan 2024 showed: "Total occlusion of the native RCA 2.  Patent left main and LAD with mild diffuse nonobstructive plaquing, no significant changes from recent cardiac catheterization study 3.  Patent left circumflex and intermediate branch with diffuse plaquing and distal  vessel disease but no significant proximal stenosis 4.  Known occlusion of diagonal and obtuse marginal saphenous vein grafts 5.  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  Severe saphenous vein graft to RCA stenoses, treated with drug-eluting stent implantation (3.0 x 24 mm Synergy DES in distal body of graft, overlapping 3.0 x 32 mm and 3.0 x 16 mm Synergy DES in proximal body of graft within the previously implanted stent)   Recommendations: Long-term DAPT with aspirin and clopidogrel as tolerated." PRIOR THERAPY: SBRT under the care of Dr. Roselind Messier. Last treatment on 02/15/21. "  Starting radiation therapy again in 12/2022.    Denies : Dizziness. Leg edema. Orthopnea. Palpitations. Paroxysmal nocturnal dyspnea. Shortness of breath. Syncope.    Back pain and fatigue limit walking.  Chronic chest pain- somewhat atypical.  She uses NTG but usually resolves with 1 tab.    Still smokes on occasion.    Past Medical History:  Diagnosis Date   Acute myocardial infarction, unspecified site, episode of care unspecified    Adenocarcinoma, lung (HCC) 02/16/2021   Allergic rhinitis, cause unspecified    CAD (coronary artery disease)    GERD (gastroesophageal reflux disease)    Headache(784.0)    History of radiation therapy    left lung SBRT 02/08/2021, 02/13/2021, 02/15/2021  Dr Antony Blackbird   HTN (hypertension)    Other and unspecified hyperlipidemia  Other diseases of lung, not elsewhere classified    Postsurgical aortocoronary bypass status    hx of it.     Past Surgical History:  Procedure Laterality Date   CARDIAC CATHETERIZATION     CARDIAC DEFIBRILLATOR PLACEMENT  03/29/2006   [employer]. remote-yes.    CORONARY ARTERY BYPASS GRAFT     x4   CORONARY STENT INTERVENTION N/A 05/11/2022   Procedure: CORONARY STENT INTERVENTION;  Surgeon: Tonny Bollman, MD;  Location: [facility] INVASIVE CV LAB;  Service: Cardiovascular;  Laterality: N/A;   CORONARY/GRAFT ACUTE MI REVASCULARIZATION N/A 05/08/2022    Procedure: Coronary/Graft Acute MI Revascularization;  Surgeon: Lennette Bihari, MD;  Location: [facility] INVASIVE CV LAB;  Service: Cardiovascular;  Laterality: N/A;   IMPLANTABLE CARDIOVERTER DEFIBRILLATOR GENERATOR CHANGE N/A 03/03/2013   Procedure: IMPLANTABLE CARDIOVERTER DEFIBRILLATOR GENERATOR CHANGE;  Surgeon: [name] Salvia, MD;  Location: [facility] CATH LAB;  Service: Cardiovascular;  Laterality: N/A;   IR ANGIOGRAM VISCERAL SELECTIVE  05/06/2022   IR AORTAGRAM ABDOMINAL SERIALOGRAM  05/06/2022   IR EMBO ART  VEN HEMORR LYMPH EXTRAV  INC GUIDE ROADMAPPING  05/06/2022   IR US GUIDE VASC ACCESS RIGHT  05/06/2022   LEFT HEART CATH AND CORONARY ANGIOGRAPHY N/A 05/08/2022   Procedure: LEFT HEART CATH AND CORONARY ANGIOGRAPHY;  Surgeon: Lennette Bihari, MD;  Location: [facility] INVASIVE CV LAB;  Service: Cardiovascular;  Laterality: N/A;   LEFT HEART CATH AND CORS/GRAFTS ANGIOGRAPHY N/A 05/11/2022   Procedure: LEFT HEART CATH AND CORS/GRAFTS ANGIOGRAPHY;  Surgeon: Tonny Bollman, MD;  Location: [facility] INVASIVE CV LAB;  Service: Cardiovascular;  Laterality: N/A;   stent implant     x1. possibly x2.      Current Outpatient Medications  Medication Sig Dispense Refill   acetaminophen (TYLENOL) 500 MG tablet Take 500 mg by mouth every 6 (six) hours as needed for headache.     albuterol (VENTOLIN HFA) 108 (90 Base) MCG/ACT inhaler Inhale 2 puffs into the lungs every 6 (six) hours as needed for wheezing or shortness of breath. 54 g 3   amLODipine (NORVASC) 5 MG tablet Take 1 tablet (5 mg total) by mouth daily. 90 tablet 3   clopidogrel (PLAVIX) 75 MG tablet Take 1 tablet (75 mg total) by mouth daily. 90 tablet 3   cyanocobalamin (VITAMIN B12) 1000 MCG tablet Take 1 tablet (1,000 mcg total) by mouth daily. 30 tablet 0   cyclobenzaprine (FLEXERIL) 10 MG tablet Take 10 mg by mouth 3 (three) times daily as needed for muscle spasms.     furosemide (LASIX) 40 MG tablet Take 1 tablet (40 mg total) by mouth daily. 90 tablet 3    isosorbide mononitrate (IMDUR) 60 MG 24 hr tablet Take 1 tablet (60 mg total) by mouth 2 (two) times daily. 180 tablet 3   lisinopril (ZESTRIL) 10 MG tablet Take 1 tablet (10 mg total) by mouth daily. 90 tablet 3   metoprolol tartrate (LOPRESSOR) 25 MG tablet Take 0.5 tablets (12.5 mg total) by mouth 2 (two) times daily. 90 tablet 3   naloxone (NARCAN) nasal spray 4 mg/0.1 mL SMARTSIG:Both Nares     nitroGLYCERIN (NITROSTAT) 0.4 MG SL tablet Place 1 tablet (0.4 mg total) under the tongue every 5 (five) minutes as needed for chest pain (X3 DOSES MAX). 25 tablet 11   oxyCODONE-acetaminophen (PERCOCET) 7.5-325 MG tablet Take 1-2 tablets by mouth every 4 (four) hours as needed for moderate pain (back pain).     polyethylene glycol (MIRALAX / GLYCOLAX) 17 g packet Take 17  g by mouth daily. 14 each 0   ranolazine (RANEXA) 500 MG 12 hr tablet Take 1 tablet (500 mg total) by mouth 2 (two) times daily. 180 tablet 3   rosuvastatin (CRESTOR) 20 MG tablet Take 1 tablet (20 mg total) by mouth daily. 90 tablet 3   pantoprazole (PROTONIX) 40 MG tablet Take 1 tablet (40 mg total) by mouth daily. 30 tablet 0   No current facility-administered medications for this visit.    Allergies:   Codeine and Novocain [procaine]    Social History:  The patient  reports that she quit smoking about 7 months ago. Her smoking use included cigarettes. She started smoking about 56 years ago. She has a 28.1 pack-year smoking history. She has never used smokeless tobacco. She reports that she does not drink alcohol and does not use drugs.   Family History:  The patient's family history includes Breast cancer in her maternal aunt; Heart attack in her mother; Heart disease in her mother.    ROS:  Please see the history of present illness.   Otherwise, review of systems are positive for back pain.   All other systems are reviewed and negative.    PHYSICAL EXAM: VS:  BP 118/60   Pulse 68   Ht 5\' 4"  (1.626 m)   Wt 147 lb 9.6  oz (67 kg)   SpO2 99%   BMI 25.34 kg/m  , BMI Body mass index is 25.34 kg/m. GEN: Well nourished, well developed, in no acute distress HEENT: normal Neck: no JVD, carotid bruits, or masses Cardiac: RRR; no murmurs, rubs, or gallops,no edema  Respiratory:  clear to auscultation bilaterally, normal work of breathing GI: soft, nontender, nondistended, + BS MS: no deformity or atrophy Skin: warm and dry, no rash Neuro:  Strength and sensation are intact Psych: euthymic mood, full affect    Recent Labs: 05/06/2022: TSH 1.004 05/13/2022: Magnesium 1.9 01/03/2023: ALT 11; BUN 13; Creatinine 1.10; Hemoglobin 13.2; Platelet Count 187; Potassium 3.6; Sodium 142   Lipid Panel    Component Value Date/Time   CHOL 142 06/06/2022 1418   TRIG 41 06/06/2022 1418   HDL 71 06/06/2022 1418   CHOLHDL 2.0 06/06/2022 1418   CHOLHDL 2.5 05/10/2022 0038   VLDL 10 05/10/2022 0038   LDLCALC 61 06/06/2022 1418   LDLDIRECT 82 01/15/2018 1536     Other studies Reviewed: Additional studies/ records that were reviewed today with results demonstrating: LDL 45 in Jan 2024.   ASSESSMENT AND PLAN:  CAD/old MI: Prior emergent CABG in the setting of acute left main occlusion in 2008.  Plavix monotherapy for 2024 SVG to RCA PCI.  No clear angina. No recurrent bleeding issues on plavix monotherapy.  EF 50-55% in Jan 2024 AICD: Followed by EP. Sudden cardiac death in [place] in the early 2000s. Hyperlipidemia: The current medical regimen is effective;  continue present plan and medications. Aortic atherosclerosis: Continue rosuvastatin.  Smoking: Avoid tobacco use. She still uses cigarettes to relieve stress.    Current medicines are reviewed at length with the patient today.  The patient concerns regarding her medicines were addressed.  The following changes have been made:  No change  Labs/ tests ordered today include:  No orders of the defined types were placed in this encounter.   Recommend 150  minutes/week of aerobic exercise Low fat, low carb, high fiber diet recommended  Disposition:   FU in 6 months with Dr. Excell Seltzer who did her PCI at the time of STEMI  Signed, Lance Muss, MD  01/13/2023 2:50 PM    [facility] [address] [place], [place], [place]  [address] Phone: [phone]; Fax: [phone]

## 2023-01-13 ENCOUNTER — Ambulatory Visit: Payer: 59 | Attending: Interventional Cardiology | Admitting: Interventional Cardiology

## 2023-01-13 ENCOUNTER — Encounter: Payer: Self-pay | Admitting: Interventional Cardiology

## 2023-01-13 VITALS — BP 118/60 | HR 68 | Ht 64.0 in | Wt 147.6 lb

## 2023-01-13 DIAGNOSIS — E785 Hyperlipidemia, unspecified: Secondary | ICD-10-CM

## 2023-01-13 DIAGNOSIS — I10 Essential (primary) hypertension: Secondary | ICD-10-CM

## 2023-01-13 DIAGNOSIS — Z95810 Presence of automatic (implantable) cardiac defibrillator: Secondary | ICD-10-CM | POA: Diagnosis not present

## 2023-01-13 DIAGNOSIS — Z720 Tobacco use: Secondary | ICD-10-CM

## 2023-01-13 DIAGNOSIS — I6523 Occlusion and stenosis of bilateral carotid arteries: Secondary | ICD-10-CM

## 2023-01-13 DIAGNOSIS — I25708 Atherosclerosis of coronary artery bypass graft(s), unspecified, with other forms of angina pectoris: Secondary | ICD-10-CM | POA: Diagnosis not present

## 2023-01-13 NOTE — Patient Instructions (Signed)
Medication Instructions:  Your physician recommends that you continue on your current medications as directed. Please refer to the Current Medication list given to you today.  *If you need a refill on your cardiac medications before your next appointment, please call your pharmacy*   Lab Work: none If you have labs (blood work) drawn today and your tests are completely normal, you will receive your results only by: MyChart Message (if you have MyChart) OR A paper copy in the mail If you have any lab test that is abnormal or we need to change your treatment, we will call you to review the results.   Testing/Procedures: none   Follow-Up: At [facility], you and your health needs are our priority.  As part of our continuing mission to provide you with exceptional heart care, we have created designated Provider Care Teams.  These Care Teams include your primary Cardiologist (physician) and Advanced Practice Providers (APPs -  Physician Assistants and Nurse Practitioners) who all work together to provide you with the care you need, when you need it.  We recommend signing up for the patient portal called "MyChart".  Sign up information is provided on this After Visit Summary.  MyChart is used to connect with patients for Virtual Visits (Telemedicine).  Patients are able to view lab/test results, encounter notes, upcoming appointments, etc.  Non-urgent messages can be sent to your provider as well.   To learn more about what you can do with MyChart, go to [email or web address].    Your next appointment:   6 month(s)  Provider:   Dr Excell Seltzer    Other Instructions

## 2023-01-14 NOTE — Progress Notes (Signed)
Radiation Oncology         [phone] ________________________________  Outpatient Re-Consultation  Name: Karen [name] MRN: [id number]  Date: 01/15/2023  DOB: [birth year]  [id number], [facility]  Si Gaul, MD   REFERRING PHYSICIAN: Si Gaul, MD  DIAGNOSIS: {There were no encounter diagnoses. (Refresh or delete this SmartLink)}  Adenocarcinoma of LLL diagnosed in 2022, with additional ground-glass nodule in the posterior right lower lobe suspicious for bronchogenic neoplasm such as noninvasive adenocarcinoma -- Now with an enlarging posterior RLL nodule concerning for malignancy.   HISTORY OF PRESENT ILLNESS::Karen [name] is a 77 y.o. female who is accompanied by ***. she is seen as a courtesy of Dr. Arbutus Ped for re-evaluation and an opinion concerning radiation therapy as part of management for her recently diagnosed enlarging RLL nodule concerning for malignancy. 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 She completed radiation to the left lung in October 2022 and was last seen here for follow-up on 03/19/21. Since that time, the patient continued under observation with Dr. Arbutus Ped  To assess her treatment response, the patient presented for a follow-up chest CT on 06/04/21 which showed a slight increase in size of 2 nodules in the right lower lobe. Dr. Arbutus Ped reviewed these findings recommended repeat imaging in 6 months for reassessment.   Accordingly, the patient presented for a chest CT with contrast on 12/05/21 which demonstrated a significant increase in size of the nodule in the superior LLL measuring 2.3 cm, consistent with primary bronchogenic carcinoma. CT otherwise showed stability of the 2 nodules located in the posterior and posteromedial RLL, measuring 1.7 cm and 1.0 cm respectfully, and no evidence of metastatic lymphadenopathy or pleural effusion.    The  patient has some aspiration issues at the time of the above CT. In that setting, an inflammatory etiology could not be ruled out regarding CT findings. Dr. Arbutus Ped accordingly offered proceeding with PET imaging vs doxycycline for 2 weeks followed by repeat CT scan of the chest in 2 months.   The patient opted to proceed with the latter and presented for a follow up chest CT on 02/04/22 which showed stable findings and now new or progressive findings overall. She also presented for a restaging PET scan on 02/22/22 which showed evidence of radiation changes corresponding to the treated lesion in the LLL, and persistence of the nodule in the posterior RUL with an SUV max of 3.0 (previously 1.79 SUV on her previous PET scan). The posteromedial right lung base nodule also appeared stable and was without appreciable FDG uptake. Overall, no signs of tracer avid nodal metastasis or distant metastatic disease were appreciated.   In light of PET/CT findings, Dr. Arbutus Ped recommended that she continue on observation with close monitoring of the nodules.   Of note: the patient was hospitalized with a GI bleed this past January which was complicated by MI. She required intensive cardiac rehab   Follow-up chest CT on 07/01/22 showed an interval increase in ground glass nodularity in the RLL measuring 2.3 cm, as well as an increase in opacity in the superior segment LLL measuring 3.9 cm. Her scans were reviewed by both Dr. Delton Coombes and Dr. Arbutus Ped and it was decided that she return in 6 months for repeat imaging.   Subsequent chest CT with contrast on 01/03/23 again showed an interval increase in size of the posterior RLL nodule measuring 2.7 cm, raising  concern for primary bronchogenic carcinoma. CT also showed evolving postradiation changes int the LLL without evidence of local tumor recurrence, and no evidence of thoracic adenopathy or evidence of metastatic disease in the chest.   Dr. Arbutus Ped has subsequently  recommended SBRT to the enlarging posterior RLL nodule which we will discuss in detail today.    PREVIOUS RADIATION THERAPY: Yes   Intent: Curative  Radiation Treatment Dates: 02/08/2021 through 02/15/2021 Site Technique Total Dose (Gy) Dose per Fx (Gy) Completed Fx Beam Energies  Lung, Left: Lung_Lt IMRT 54/54 18 3/3 6XFFF    PAST MEDICAL HISTORY:  Past Medical History:  Diagnosis Date   Acute myocardial infarction, unspecified site, episode of care unspecified    Adenocarcinoma, 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     PAST SURGICAL HISTORY: Past Surgical History:  Procedure Laterality Date   CARDIAC CATHETERIZATION     CARDIAC DEFIBRILLATOR PLACEMENT  03/29/2006   [employer]. remote-yes.    CORONARY ARTERY BYPASS GRAFT     x4   CORONARY STENT INTERVENTION N/A 05/11/2022   Procedure: CORONARY STENT INTERVENTION;  Surgeon: Tonny Bollman, MD;  Location[facility] INVASIVE CV LAB;  Service: Cardiovascular;  Laterality: N/A;   CORONARY/GRAFT ACUTE MI REVASCULARIZATION N/A 05/08/2022   Procedure: Coronary/Graft Acute MI Revascularization;  Surgeon: Lennette Bihari, MD;  Location: [facility] INVASIVE CV LAB;  Service: Cardiovascular;  Laterality: N/A;   IMPLANTABLE CARDIOVERTER DEFIBRILLATOR GENERATOR CHANGE N/A 03/03/2013   Procedure: IMPLANTABLE CARDIOVERTER DEFIBRILLATOR GENERATOR CHANGE;  Surgeon: [name] Salvia, MD;  Location: [facility] CATH LAB;  Service: Cardiovascular;  Laterality: N/A;   IR ANGIOGRAM VISCERAL SELECTIVE  05/06/2022   IR AORTAGRAM ABDOMINAL SERIALOGRAM  05/06/2022   IR EMBO ART  VEN HEMORR  LYMPH EXTRAV  INC GUIDE ROADMAPPING  05/06/2022   IR US GUIDE VASC ACCESS RIGHT  05/06/2022   LEFT HEART CATH AND CORONARY ANGIOGRAPHY N/A 05/08/2022   Procedure: LEFT HEART CATH AND CORONARY ANGIOGRAPHY;  Surgeon: Lennette Bihari, MD;  Location: [facility] INVASIVE CV LAB;  Service: Cardiovascular;  Laterality: N/A;   LEFT HEART CATH AND CORS/GRAFTS ANGIOGRAPHY N/A 05/11/2022   Procedure: LEFT HEART CATH AND CORS/GRAFTS ANGIOGRAPHY;  Surgeon: Tonny Bollman, MD;  Location: [facility] INVASIVE CV LAB;  Service: Cardiovascular;  Laterality: N/A;   stent implant     x1. possibly x2.     FAMILY HISTORY:  Family History  Problem Relation Age of Onset   Heart attack Mother    Heart disease Mother    Breast cancer Maternal Aunt     SOCIAL HISTORY:  Social History   Tobacco Use   Smoking status: Former    Current packs/day: 0.00    Average packs/day: 0.5 packs/day for 56.1 years (28.1 ttl pk-yrs)    Types: Cigarettes    Start date: 35    Quit date: 06/05/2022    Years since quitting: 0.6   Smokeless tobacco: Never   Tobacco comments:    Smokes 1 cigarette every once in a while ARJ 08/22/22  Vaping Use   Vaping status: Never Used  Substance Use  Topics   Alcohol use: No   Drug use: No    ALLERGIES:  Allergies  Allergen Reactions   Codeine Itching and Swelling   Novocain [Procaine] Hives and Swelling    Eyes swelling    MEDICATIONS:  Current Outpatient Medications  Medication Sig Dispense Refill   acetaminophen (TYLENOL) 500 MG tablet Take 500 mg by mouth every 6 (six) hours as needed for headache.     albuterol (VENTOLIN HFA) 108 (90 Base) MCG/ACT inhaler Inhale 2 puffs into the lungs every 6 (six) hours as needed for wheezing or shortness of breath. 54 g 3   amLODipine (NORVASC) 5 MG tablet Take 1 tablet (5 mg total) by mouth daily. 90 tablet 3   clopidogrel (PLAVIX) 75 MG tablet Take 1 tablet (75 mg total) by mouth daily. 90 tablet 3   cyanocobalamin (VITAMIN B12) 1000 MCG tablet Take  1 tablet (1,000 mcg total) by mouth daily. 30 tablet 0   cyclobenzaprine (FLEXERIL) 10 MG tablet Take 10 mg by mouth 3 (three) times daily as needed for muscle spasms.     furosemide (LASIX) 40 MG tablet Take 1 tablet (40 mg total) by mouth daily. 90 tablet 3   isosorbide mononitrate (IMDUR) 60 MG 24 hr tablet Take 1 tablet (60 mg total) by mouth 2 (two) times daily. 180 tablet 3   lisinopril (ZESTRIL) 10 MG tablet Take 1 tablet (10 mg total) by mouth daily. 90 tablet 3   metoprolol tartrate (LOPRESSOR) 25 MG tablet Take 0.5 tablets (12.5 mg total) by mouth 2 (two) times daily. 90 tablet 3   naloxone (NARCAN) nasal spray 4 mg/0.1 mL SMARTSIG:Both Nares     nitroGLYCERIN (NITROSTAT) 0.4 MG SL tablet Place 1 tablet (0.4 mg total) under the tongue every 5 (five) minutes as needed for chest pain (X3 DOSES MAX). 25 tablet 11   oxyCODONE-acetaminophen (PERCOCET) 7.5-325 MG tablet Take 1-2 tablets by mouth every 4 (four) hours as needed for moderate pain (back pain).     pantoprazole (PROTONIX) 40 MG tablet Take 1 tablet (40 mg total) by mouth daily. 30 tablet 0   polyethylene glycol (MIRALAX / GLYCOLAX) 17 g packet Take 17 g by mouth daily. 14 each 0   ranolazine (RANEXA) 500 MG 12 hr tablet Take 1 tablet (500 mg total) by mouth 2 (two) times daily. 180 tablet 3   rosuvastatin (CRESTOR) 20 MG tablet Take 1 tablet (20 mg total) by mouth daily. 90 tablet 3   No current facility-administered medications for this encounter.    REVIEW OF SYSTEMS:  A 10+ POINT REVIEW OF SYSTEMS WAS OBTAINED including neurology, dermatology, psychiatry, cardiac, respiratory, lymph, extremities, GI, GU, musculoskeletal, constitutional, reproductive, HEENT. ***   PHYSICAL EXAM:  vitals were not taken for this visit.   General: Alert and oriented, in no acute distress HEENT: Head is normocephalic. Extraocular movements are intact. Oropharynx is clear. Neck: Neck is supple, no palpable cervical or supraclavicular  lymphadenopathy. Heart: Regular in rate and rhythm with no murmurs, rubs, or gallops. Chest: Clear to auscultation bilaterally, with no rhonchi, wheezes, or rales. Abdomen: Soft, nontender, nondistended, with no rigidity or guarding. Extremities: No cyanosis or edema. Lymphatics: see Neck Exam Skin: No concerning lesions. Musculoskeletal: symmetric strength and muscle tone throughout. Neurologic: Cranial nerves II through XII are grossly intact. No obvious focalities. Speech is fluent. Coordination is intact. Psychiatric: Judgment and insight are intact. Affect is appropriate. ***  ECOG = ***  0 - Asymptomatic (Fully active, able to carry on  all predisease activities without restriction)  1 - Symptomatic but completely ambulatory (Restricted in physically strenuous activity but ambulatory and able to carry out work of a light or sedentary nature. For example, light housework, office work)  2 - Symptomatic, <50% in bed during the day (Ambulatory and capable of all self care but unable to carry out any work activities. Up and about more than 50% of waking hours)  3 - Symptomatic, >50% in bed, but not bedbound (Capable of only limited self-care, confined to bed or chair 50% or more of waking hours)  4 - Bedbound (Completely disabled. Cannot carry on any self-care. Totally confined to bed or chair)  5 - Death   Santiago Glad MM, Creech RH, Tormey DC, et al. [phone]). "Toxicity and response criteria of the [facility]". Am. Evlyn Clines. Oncol. 5 (6): 649-55  LABORATORY DATA:  Lab Results  Component Value Date   WBC 3.7 (L) 01/03/2023   HGB 13.2 01/03/2023   HCT 39.0 01/03/2023   MCV 95.6 01/03/2023   PLT 187 01/03/2023   NEUTROABS 2.1 01/03/2023   Lab Results  Component Value Date   NA 142 01/03/2023   K 3.6 01/03/2023   CL 106 01/03/2023   CO2 31 01/03/2023   GLUCOSE 115 (H) 01/03/2023   BUN 13 01/03/2023   CREATININE 1.10 (H) 01/03/2023   CALCIUM 9.7 01/03/2023       RADIOGRAPHY: CT Chest W Contrast  Result Date: 01/09/2023 CLINICAL DATA:  Non-small cell lung cancer (NSCLC), restaging. Completed radiation therapy. * Tracking Code: BO * EXAM: CT CHEST WITH CONTRAST TECHNIQUE: Multidetector CT imaging of the chest was performed during intravenous contrast administration. RADIATION DOSE REDUCTION: This exam was performed according to the departmental dose-optimization program which includes automated exposure control, adjustment of the mA and/or kV according to patient size and/or use of iterative reconstruction technique. CONTRAST:  75mL OMNIPAQUE IOHEXOL 300 MG/ML  SOLN COMPARISON:  07/01/2022 chest CT FINDINGS: Cardiovascular: Normal heart size. No significant pericardial effusion/thickening. Three-vessel coronary atherosclerosis status post CABG. Single lead left subclavian ICD with lead tip in the right ventricular apex. Atherosclerotic nonaneurysmal thoracic aorta. Normal caliber pulmonary arteries. No central pulmonary emboli. Mediastinum/Nodes: No significant thyroid nodules. Unremarkable esophagus. No pathologically enlarged axillary, mediastinal or hilar lymph nodes. Lungs/Pleura: No pneumothorax. No pleural effusion. Masslike fibrosis in the superior segment left lower lobe measures 3.6 x 2.0 cm (series 4/image 50), previously 3.9 x 2.5 cm, mildly decreased, compatible with evolving postradiation change. Part solid 2.7 x 1.9 cm posterior right lower lobe pulmonary nodule with 1.2 cm solid component (series 4/image 70), previously 2.3 x 1.7 cm with 0.8 cm solid component, increased. No acute consolidative airspace disease or additional significant pulmonary nodules. Upper abdomen: Subcentimeter hypodense scattered right liver lesions are too small to characterize and unchanged. Subcentimeter hypervascular lateral segment left liver lesion is too small to characterize and unchanged. Left colonic diverticulosis. Musculoskeletal: No aggressive appearing focal  osseous lesions. Mild thoracic spondylosis. Intact sternotomy wires. IMPRESSION: 1. Part solid 2.7 cm posterior right lower lobe pulmonary nodule with 1.2 cm solid component, increased in size and solid component since 07/01/2022 chest CT, highly suspicious for primary bronchogenic adenocarcinoma. 2. Evolving postradiation change in the superior segment left lower lobe, with no evidence of local tumor recurrence. 3. No thoracic adenopathy or other findings of metastatic disease in the chest. 4.  Aortic Atherosclerosis (ICD10-I70.0). Electronically Signed   By: Delbert Phenix M.D.   On: 01/09/2023 08:41  IMPRESSION: Adenocarcinoma of LLL diagnosed in 2022, with additional ground-glass nodule in the posterior right lower lobe suspicious for bronchogenic neoplasm such as noninvasive adenocarcinoma -- Now with an enlarging posterior RLL nodule concerning for malignancy.  ***  Today, I talked to the patient and family about the findings and work-up thus far.  We discussed the natural history of *** and general treatment, highlighting the role of radiotherapy in the management.  We discussed the available radiation techniques, and focused on the details of logistics and delivery.  We reviewed the anticipated acute and late sequelae associated with radiation in this setting.  The patient was encouraged to ask questions that I answered to the best of my ability. *** A patient consent form was discussed and signed.  We retained a copy for our records.  The patient would like to proceed with radiation and will be scheduled for CT simulation.  PLAN: ***    *** minutes of total time was spent for this patient encounter, including preparation, face-to-face counseling with the patient and coordination of care, physical exam, and documentation of the encounter.   ------------------------------------------------  Billie Lade, PhD, MD  This document serves as a record of services personally performed by Antony Blackbird, MD. It was created on his behalf by Neena Rhymes, a trained medical scribe. The creation of this record is based on the scribe's personal observations and the provider's statements to them. This document has been checked and approved by the attending provider.

## 2023-01-14 NOTE — Progress Notes (Incomplete)
Location of tumor and Histology per Pathology Report:     Biopsy: NA  Past/Anticipated interventions by surgeon, if any: NA   Past/Anticipated interventions by medical oncology, if any: Dr, Arbutus Ped       Pain issues, if any:  {:18581} {PAIN DESCRIPTION:21022940}  SAFETY ISSUES: Prior radiation? Yes, left lung SBRT 02/08/21-02/15/21-Dr. Antony Blackbird   Pacemaker/ICD? {:18581} Possible current pregnancy? no Is the patient on methotrexate? {:18581}  Current Complaints / other details:  ***     ***

## 2023-01-15 ENCOUNTER — Ambulatory Visit
Admission: RE | Admit: 2023-01-15 | Discharge: 2023-01-15 | Disposition: A | Discharge status: Home / Self Care | Payer: 59 | Source: Ambulatory Visit | Attending: Radiation Oncology | Admitting: Radiation Oncology

## 2023-01-15 ENCOUNTER — Encounter: Payer: Self-pay | Admitting: Radiation Oncology

## 2023-01-15 ENCOUNTER — Encounter: Payer: Self-pay | Admitting: Internal Medicine

## 2023-01-15 VITALS — BP 110/61 | HR 70 | Temp 97.7°F | Resp 20 | Ht 64.0 in | Wt 146.4 lb

## 2023-01-15 DIAGNOSIS — Z7902 Long term (current) use of antithrombotics/antiplatelets: Secondary | ICD-10-CM | POA: Diagnosis not present

## 2023-01-15 DIAGNOSIS — I252 Old myocardial infarction: Secondary | ICD-10-CM | POA: Diagnosis not present

## 2023-01-15 DIAGNOSIS — C3432 Malignant neoplasm of lower lobe, left bronchus or lung: Secondary | ICD-10-CM | POA: Diagnosis not present

## 2023-01-15 DIAGNOSIS — K219 Gastro-esophageal reflux disease without esophagitis: Secondary | ICD-10-CM | POA: Diagnosis not present

## 2023-01-15 DIAGNOSIS — Z87891 Personal history of nicotine dependence: Secondary | ICD-10-CM | POA: Diagnosis not present

## 2023-01-15 DIAGNOSIS — C3431 Malignant neoplasm of lower lobe, right bronchus or lung: Secondary | ICD-10-CM

## 2023-01-15 DIAGNOSIS — Z79899 Other long term (current) drug therapy: Secondary | ICD-10-CM | POA: Diagnosis not present

## 2023-01-15 DIAGNOSIS — I10 Essential (primary) hypertension: Secondary | ICD-10-CM | POA: Insufficient documentation

## 2023-01-15 DIAGNOSIS — E785 Hyperlipidemia, unspecified: Secondary | ICD-10-CM | POA: Insufficient documentation

## 2023-01-15 DIAGNOSIS — Z923 Personal history of irradiation: Secondary | ICD-10-CM | POA: Insufficient documentation

## 2023-01-15 DIAGNOSIS — Z803 Family history of malignant neoplasm of breast: Secondary | ICD-10-CM | POA: Diagnosis not present

## 2023-01-15 DIAGNOSIS — C3490 Malignant neoplasm of unspecified part of unspecified bronchus or lung: Secondary | ICD-10-CM

## 2023-01-15 DIAGNOSIS — I2510 Atherosclerotic heart disease of native coronary artery without angina pectoris: Secondary | ICD-10-CM | POA: Insufficient documentation

## 2023-01-15 NOTE — Progress Notes (Signed)
TO BE COMPLETED BY RADIATION ONCOLOGIST OFFICE:   Patient Name: Karen [name]   Date of Birth: 09/12/1945   Radiation Oncologist: Dr. Antony Blackbird   Site to be Treated: RT Lung  Will x-rays >10 MV be used? No  Will the radiation be >10 cm from the device? Yes  Planned Treatment Start Date: [facility] 01/21/23  TO BE COMPLETED BY CARDIOLOGIST OFFICE:   Device Information:  Pacemaker []      ICD [x]    Brand: [employer]: [id number] Model #: [employer] E141 [facility] VR  Serial Number: [id number]     Date of Placement: 03/04/23  Site of Placement: Left Chest  Remote Device Check--Frequency: Every 91 days   Last Check: 12/26/22  Is the Patient Pacer Dependent?:  Yes []   No [x]   Does cardiologist request Radiation Oncology to schedule device testing by vendor for the following:  Prior to the Initiation of Treatments?  Yes []  No [x]  During Treatments?  Yes []  No [x]  Post Radiation Treatments?  Yes [x]  No []   Is device monitoring necessary by vendor/cardiologist team during treatments?  Yes []   No [x]   Is cardiac monitoring by Radiation Oncology nursing necessary during treatments? Yes [x]   No []   Do you recommend device be relocated prior to Radiation Treatment? Yes []   No [x]   **PLEASE LIST ANY NOTES OR SPECIAL REQUESTS:       CARDIOLOGIST SIGNATURE:  Dr. Sherryl Manges Per Device Clinic Standing Orders, Lenor Coffin  01/15/2023 4:53 PM  **Please route completed form back to Radiation Oncology Nursing and "P [facility] RAD ONC ADMIN", OR send an update if there will be a delay in having form completed by expected start date.  **Call [phone] if you have any questions or do not get an in-basket response from a Radiation Oncology staff member

## 2023-01-21 ENCOUNTER — Ambulatory Visit
Admission: RE | Admit: 2023-01-21 | Discharge: 2023-01-21 | Disposition: A | Discharge status: Home / Self Care | Payer: 59 | Source: Ambulatory Visit | Attending: Radiation Oncology | Admitting: Radiation Oncology

## 2023-01-21 DIAGNOSIS — C3491 Malignant neoplasm of unspecified part of right bronchus or lung: Secondary | ICD-10-CM

## 2023-01-21 DIAGNOSIS — C3431 Malignant neoplasm of lower lobe, right bronchus or lung: Secondary | ICD-10-CM | POA: Insufficient documentation

## 2023-01-22 DIAGNOSIS — C3431 Malignant neoplasm of lower lobe, right bronchus or lung: Secondary | ICD-10-CM | POA: Diagnosis not present

## 2023-01-23 ENCOUNTER — Ambulatory Visit: Payer: 59

## 2023-01-23 DIAGNOSIS — I4901 Ventricular fibrillation: Secondary | ICD-10-CM

## 2023-01-23 LAB — CUP PACEART REMOTE DEVICE CHECK
Battery Remaining Longevity: 48 mo
Battery Remaining Percentage: 50 %
Brady Statistic RV Percent Paced: 0 %
Date Time Interrogation Session: 20240926100900
HighPow Impedance: 70 Ohm
Implantable Lead Connection Status: 753985
Implantable Lead Implant Date: 20071205
Implantable Lead Location: 753860
Implantable Lead Model: 137
Implantable Lead Serial Number: 102363
Implantable Pulse Generator Implant Date: 20141105
Lead Channel Impedance Value: 464 Ohm
Lead Channel Pacing Threshold Amplitude: 0.8 V
Lead Channel Pacing Threshold Pulse Width: 0.5 ms
Lead Channel Setting Pacing Amplitude: 2 V
Lead Channel Setting Pacing Pulse Width: 0.5 ms
Lead Channel Setting Sensing Sensitivity: 0.6 mV
Pulse Gen Serial Number: 126146

## 2023-02-04 ENCOUNTER — Other Ambulatory Visit: Payer: Self-pay

## 2023-02-04 ENCOUNTER — Ambulatory Visit
Admission: RE | Admit: 2023-02-04 | Discharge: 2023-02-04 | Disposition: A | Discharge status: Home / Self Care | Payer: 59 | Source: Ambulatory Visit | Attending: Radiation Oncology | Admitting: Radiation Oncology

## 2023-02-04 ENCOUNTER — Ambulatory Visit: Payer: 59

## 2023-02-04 DIAGNOSIS — Z510 Encounter for antineoplastic radiation therapy: Secondary | ICD-10-CM | POA: Insufficient documentation

## 2023-02-04 DIAGNOSIS — C3431 Malignant neoplasm of lower lobe, right bronchus or lung: Secondary | ICD-10-CM | POA: Diagnosis not present

## 2023-02-04 DIAGNOSIS — C3491 Malignant neoplasm of unspecified part of right bronchus or lung: Secondary | ICD-10-CM | POA: Insufficient documentation

## 2023-02-04 LAB — RAD ONC ARIA SESSION SUMMARY
Course Elapsed Days: 0
Plan Fractions Treated to Date: 1
Plan Prescribed Dose Per Fraction: 18 Gy
Plan Total Fractions Prescribed: 3
Plan Total Prescribed Dose: 54 Gy
Reference Point Dosage Given to Date: 18 Gy
Reference Point Session Dosage Given: 18 Gy
Session Number: 1

## 2023-02-05 ENCOUNTER — Ambulatory Visit: Payer: 59 | Admitting: Radiation Oncology

## 2023-02-05 NOTE — Progress Notes (Signed)
Remote ICD transmission.   

## 2023-02-06 ENCOUNTER — Ambulatory Visit
Admission: RE | Admit: 2023-02-06 | Discharge: 2023-02-06 | Disposition: A | Discharge status: Home / Self Care | Payer: 59 | Source: Ambulatory Visit | Attending: Radiation Oncology | Admitting: Radiation Oncology

## 2023-02-06 ENCOUNTER — Other Ambulatory Visit: Payer: Self-pay

## 2023-02-06 DIAGNOSIS — Z510 Encounter for antineoplastic radiation therapy: Secondary | ICD-10-CM | POA: Diagnosis not present

## 2023-02-06 DIAGNOSIS — C3491 Malignant neoplasm of unspecified part of right bronchus or lung: Secondary | ICD-10-CM

## 2023-02-06 LAB — RAD ONC ARIA SESSION SUMMARY
Course Elapsed Days: 2
Plan Fractions Treated to Date: 2
Plan Prescribed Dose Per Fraction: 18 Gy
Plan Total Fractions Prescribed: 3
Plan Total Prescribed Dose: 54 Gy
Reference Point Dosage Given to Date: 36 Gy
Reference Point Session Dosage Given: 18 Gy
Session Number: 2

## 2023-02-07 ENCOUNTER — Ambulatory Visit: Payer: 59 | Admitting: Radiation Oncology

## 2023-02-10 ENCOUNTER — Ambulatory Visit: Payer: 59 | Admitting: Radiation Oncology

## 2023-02-10 ENCOUNTER — Ambulatory Visit: Payer: 59

## 2023-02-10 ENCOUNTER — Telehealth: Payer: Self-pay | Admitting: Radiation Oncology

## 2023-02-10 NOTE — Telephone Encounter (Signed)
10/14 @ 4:12 pm patient left voicemail to be reschedule for her missed treatment appt from today.  Forward email to Support RTT and copied L1 machine so they are aware.

## 2023-02-11 ENCOUNTER — Ambulatory Visit: Payer: 59

## 2023-02-12 ENCOUNTER — Ambulatory Visit
Admission: RE | Admit: 2023-02-12 | Discharge: 2023-02-12 | Disposition: A | Discharge status: Home / Self Care | Payer: 59 | Source: Ambulatory Visit | Attending: Radiation Oncology | Admitting: Radiation Oncology

## 2023-02-12 ENCOUNTER — Encounter: Payer: Self-pay | Admitting: Emergency Medicine

## 2023-02-12 ENCOUNTER — Other Ambulatory Visit: Payer: Self-pay

## 2023-02-12 ENCOUNTER — Telehealth: Payer: Self-pay | Admitting: Radiation Oncology

## 2023-02-12 ENCOUNTER — Ambulatory Visit (INDEPENDENT_AMBULATORY_CARE_PROVIDER_SITE_OTHER): Payer: 59 | Admitting: Emergency Medicine

## 2023-02-12 ENCOUNTER — Ambulatory Visit: Payer: 59 | Admitting: Radiation Oncology

## 2023-02-12 VITALS — BP 115/75 | HR 77 | Temp 98.4°F | Ht 63.0 in | Wt 148.0 lb

## 2023-02-12 DIAGNOSIS — J449 Chronic obstructive pulmonary disease, unspecified: Secondary | ICD-10-CM | POA: Diagnosis not present

## 2023-02-12 DIAGNOSIS — Z510 Encounter for antineoplastic radiation therapy: Secondary | ICD-10-CM | POA: Diagnosis not present

## 2023-02-12 DIAGNOSIS — C3491 Malignant neoplasm of unspecified part of right bronchus or lung: Secondary | ICD-10-CM | POA: Diagnosis not present

## 2023-02-12 LAB — RAD ONC ARIA SESSION SUMMARY
Course Elapsed Days: 8
Plan Fractions Treated to Date: 3
Plan Prescribed Dose Per Fraction: 18 Gy
Plan Total Fractions Prescribed: 3
Plan Total Prescribed Dose: 54 Gy
Reference Point Dosage Given to Date: 54 Gy
Reference Point Session Dosage Given: 18 Gy
Session Number: 3

## 2023-02-12 MED ORDER — STIOLTO RESPIMAT 2.5-2.5 MCG/ACT IN AERS: 2.0000 | INHALATION_SPRAY | Freq: Every day | RESPIRATORY_TRACT | 11 refills | Status: DC

## 2023-02-12 MED ORDER — ALBUTEROL SULFATE HFA 108 (90 BASE) MCG/ACT IN AERS: 2.0000 | INHALATION_SPRAY | Freq: Four times a day (QID) | RESPIRATORY_TRACT | 3 refills | Status: DC | PRN

## 2023-02-12 NOTE — Assessment & Plan Note (Signed)
We have been following an evolving right lower lobe pulmonary nodule that had increased in size.  She has seen radiation oncology and is completing SBRT (empiric) to this now.  There is no evidence of recurrence in the treated left sided nodule.  Continue follow-up with oncology and radiation oncology for serial imaging

## 2023-02-12 NOTE — Patient Instructions (Addendum)
We will restart Stiolto 2 puffs once daily.  You take this every day on a schedule as a maintenance medication. We will refill your albuterol.  You can use 2 puffs if you needed for shortness of breath, chest tightness, wheezing.  You do not have to take this on a schedule.  It is used to treat acute symptoms, just when you need it Flu shot up-to-date Follow with radiation oncology as planned for your treatment follow-up and to plan your follow-up CT scans of the chest Please follow with APP in about 2 months to review how you are doing on the inhaled medication and to ensure that you are using these correctly.  Bring all the medicines with you including all the inhalers. Follow Dr. Delton Coombes in 6 months, sooner if you have problems.

## 2023-02-12 NOTE — Telephone Encounter (Signed)
10/16 @ 1:15 pm Received call from [employer] - [phone]) concerning not being able to come till 3:45 pm to check patient's device.  Called and spoke to [employer]	[place].  Transfer call as requested.

## 2023-02-12 NOTE — Progress Notes (Signed)
Subjective:    Patient ID: Karen [name], female    DOB: 1945-05-17, 77 y.o.   MRN: [id number]  HPI   ROV 08/22/2022 --Ms. 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  She has persistent nodular disease that we have been following with serial imaging.  PET scan 01/2022 showed some mild hypermetabolism in the treated left lower lobe superior segmental nodule, no hypermetabolism in other nodules.  Her most recent scan was done 07/01/2022 as below.  She has been on Stiolto since last visit (from [facility]) - she is unsure which one she has, does not use every day.  She has chronic cough with GERD.  Today she reports that she is doing cardiac rehab, feels that she is benefiting from this. Does have SOB with the activity. She coughs at night. I had discussed changing ACE-I to ARB, but she doesn;t know which one she is on.   CT chest 07/01/2022 reviewed by me shows atelectatic change along the medial right lung base that is unchanged.  Slight increase in size in an area of posterior right lower lobe groundglass now 2.3 cm.  Left lung with a tiny effusion, more confluent superior segmental left lower lobe opacity now 3.9 cm  ROV 02/12/2023 --77 year old woman with a history of left lower lobe adenocarcinoma and SBRT 12/2020, COPD.  She has residual persistent nodular disease that has been followed with serial imaging.  This included some increase in size of her treated left lower lobe mixed density lesion and also a right lower lobe groundglass lesion that was felt to be most consistent with another adenocarcinoma this was treated empirically with SBRT, last treatment is supposed to be today.  She has tried scheduled BD before but not reliably.  She is unsure whether they have ever been beneficial.  She has used her albuterol in conjunction with her cardiac rehab. She does not have any right now. She has Stiolto or Spiriva - she isn't sure which. She uses 3-5x  a week, not every day.  She wonders about possible FMLA paperwork for her son whose had to take care of her, other family members with medical issues, transportation etc.  It sounds like he has left his job so I am not sure whether FMLA will be possible.  Review of Systems As per HPI  Past Medical History:  Diagnosis Date   Acute myocardial infarction, unspecified site, episode of care unspecified    Adenocarcinoma, 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      Family History  Problem Relation Age of Onset   Heart attack Mother    Heart disease Mother    Breast cancer Maternal Aunt      Social History   Socioeconomic History   Marital status: Legally Separated    Spouse name: Not on file   Number of children: Not on file   Years of education: 11   Highest education level: 11th grade  Occupational History   Not on file  Tobacco Use  Smoking status: Former    Current packs/day: 0.00    Average packs/day: 0.5 packs/day for 56.1 years (28.1 ttl pk-yrs)    Types: Cigarettes    Start date: 46    Quit date: 06/05/2022    Years since quitting: 0.6   Smokeless tobacco: Never   Tobacco comments:    Smokes 1 cigarette every once in a while ARJ 08/22/22  Vaping Use   Vaping status: Never Used  Substance and Sexual Activity   Alcohol use: No   Drug use: No   Sexual activity: Not Currently  Other Topics Concern   Not on file  Social History Narrative   Lives with 2 sons and granddaughter   Social Determinants of Health   Financial Resource Strain: Not on file  Food Insecurity: Food  Insecurity Present (01/15/2023)   Hunger Vital Sign    Worried About Running Out of Food in the Last Year: Often true    Ran Out of Food in the Last Year: Often true  Transportation Needs: No Transportation Needs (01/15/2023)   PRAPARE - Administrator, Civil Service (Medical): No    Lack of Transportation (Non-Medical): No  Physical Activity: Not on file  Stress: Not on file  Social Connections: Not on file  Intimate Partner Violence: Not At Risk (01/15/2023)   Humiliation, Afraid, Rape, and Kick questionnaire    Fear of Current or Ex-Partner: No    Emotionally Abused: No    Physically Abused: No    Sexually Abused: No    No hx TB exposure [place] L[place] in [place]   Allergies  Allergen Reactions   Codeine Itching and Swelling   Novocain [Procaine] Hives and Swelling    Eyes swelling     Outpatient Medications Prior to Visit  Medication Sig Dispense Refill   acetaminophen (TYLENOL) 500 MG tablet Take 500 mg by mouth every 6 (six) hours as needed for headache.     amLODipine (NORVASC) 5 MG tablet Take 1 tablet (5 mg total) by mouth daily. 90 tablet 3   clopidogrel (PLAVIX) 75 MG tablet Take 1 tablet (75 mg total) by mouth daily. 90 tablet 3   cyanocobalamin (VITAMIN B12) 1000 MCG tablet Take 1 tablet (1,000 mcg total) by mouth daily. 30 tablet 0   cyclobenzaprine (FLEXERIL) 10 MG tablet Take 10 mg by mouth 3 (three) times daily as needed for muscle spasms.     furosemide (LASIX) 40 MG tablet Take 1 tablet (40 mg total) by mouth daily. 90 tablet 3   isosorbide mononitrate (IMDUR) 60 MG 24 hr tablet Take 1 tablet (60 mg total) by mouth 2 (two) times daily. 180 tablet 3   lisinopril (ZESTRIL) 10 MG tablet Take 1 tablet (10 mg total) by mouth daily. 90 tablet 3   metoprolol tartrate (LOPRESSOR) 25 MG tablet Take 0.5 tablets (12.5 mg total) by mouth 2 (two) times daily. 90 tablet 3   naloxone (NARCAN) nasal spray 4 mg/0.1 mL SMARTSIG:Both Nares      nitroGLYCERIN (NITROSTAT) 0.4 MG SL tablet Place 1 tablet (0.4 mg total) under the tongue every 5 (five) minutes as needed for chest pain (X3 DOSES MAX). 25 tablet 11   oxyCODONE-acetaminophen (PERCOCET) 7.5-325 MG tablet Take 1-2 tablets by mouth every 4 (four) hours as needed for moderate pain (back pain).     polyethylene glycol (MIRALAX / GLYCOLAX) 17 g packet Take 17 g by mouth daily. 14 each 0  ranolazine (RANEXA) 500 MG 12 hr tablet Take 1 tablet (500 mg total) by mouth 2 (two) times daily. 180 tablet 3   rosuvastatin (CRESTOR) 20 MG tablet Take 1 tablet (20 mg total) by mouth daily. 90 tablet 3   albuterol (VENTOLIN HFA) 108 (90 Base) MCG/ACT inhaler Inhale 2 puffs into the lungs every 6 (six) hours as needed for wheezing or shortness of breath. 54 g 3   pantoprazole (PROTONIX) 40 MG tablet Take 1 tablet (40 mg total) by mouth daily. 30 tablet 0   No facility-administered medications prior to visit.          Objective:   Physical Exam  Vitals:   02/12/23 1118  BP: 115/75  Pulse: 77  Temp: 98.4 F (36.9 C)  TempSrc: Oral  SpO2: 98%  Weight: 148 lb (67.1 kg)  Height: 5\' 3"  (1.6 m)   Gen: Pleasant, well-nourished, in no distress,  normal affect  ENT: No lesions,  mouth clear, dentures, oropharynx clear, no postnasal drip  Neck: No JVD, no stridor  Lungs: No use of accessory muscles, no crackles or wheezing on normal respiration, no wheeze  Cardiovascular: RRR, heart sounds normal, no murmur or gallops, no peripheral edema  Musculoskeletal: No deformities, no cyanosis or clubbing  Neuro: alert, awake, non focal  Skin: Warm, no lesions or rash     Assessment & Plan:  Adenocarcinoma, lung (HCC) We have been following an evolving right lower lobe pulmonary nodule that had increased in size.  She has seen radiation oncology and is completing SBRT (empiric) to this now.  There is no evidence of recurrence in the treated left sided nodule.  Continue follow-up with  oncology and radiation oncology for serial imaging  Stage 2 moderate COPD by GOLD classification (HCC) Still some confusion regarding her medications.  She was using scheduled BD, I think Stiolto, about 5 days a week.  Tried to explain to her the rationale to use this consistently as a maintenance medication.  I will reorder the Stiolto now.  I will also reorder an albuterol for her.  I will have her come back in 2 months with all of her inhalers to review and ensure that she has improved and that she is taking them correctly.  Flu shot up-to-date.   Levy Pupa, MD, PhD 02/12/2023, 11:50 AM [facility] [phone] or if no answer before 7:00PM call [phone] For any issues after 7:00PM please call eLink [phone]

## 2023-02-12 NOTE — Assessment & Plan Note (Signed)
Still some confusion regarding her medications.  She was using scheduled BD, I think Stiolto, about 5 days a week.  Tried to explain to her the rationale to use this consistently as a maintenance medication.  I will reorder the Stiolto now.  I will also reorder an albuterol for her.  I will have her come back in 2 months with all of her inhalers to review and ensure that she has improved and that she is taking them correctly.  Flu shot up-to-date.

## 2023-02-13 NOTE — Radiation Completion Notes (Signed)
Patient Name: Karen [name], Karen [name] MRN: [id number] Date of Birth: 15-Feb-1946 Referring Physician: Si Gaul, M.D. Date of Service: 2023-02-13 Radiation Oncologist: Arnette Schaumann, M.D. [facility] [place]                             RADIATION ONCOLOGY END OF TREATMENT NOTE     Diagnosis: C34.31 Malignant neoplasm of lower lobe, right bronchus or lung Staging on 2021-11-29: Adenocarcinoma, lung (HCC) T=cT1b, N=cN0, M=cM0 Intent: Curative     ==========DELIVERED PLANS==========  First Treatment Date: 2023-02-04 - Last Treatment Date: 2023-02-12   Plan Name: Lung_R_SBRT Site: Lung, Right Technique: SBRT/SRT-IMRT Mode: Photon Dose Per Fraction: 18 Gy Prescribed Dose (Delivered / Prescribed): 54 Gy / 54 Gy Prescribed Fxs (Delivered / Prescribed): 3 / 3     ==========ON TREATMENT VISIT DATES========== 2023-02-04, 2023-02-06, 2023-02-12, 2023-02-12     ==========UPCOMING VISITS==========       ==========APPENDIX - ON TREATMENT VISIT NOTES==========   See weekly On Treatment Notes in Epic for details.

## 2023-02-14 ENCOUNTER — Ambulatory Visit: Payer: 59 | Admitting: Radiation Oncology

## 2023-03-13 ENCOUNTER — Encounter: Payer: Self-pay | Admitting: Radiation Oncology

## 2023-03-17 ENCOUNTER — Encounter: Payer: Self-pay | Admitting: Radiation Oncology

## 2023-03-17 ENCOUNTER — Ambulatory Visit
Admission: RE | Admit: 2023-03-17 | Discharge: 2023-03-17 | Disposition: A | Discharge status: Home / Self Care | Payer: 59 | Source: Ambulatory Visit | Attending: Radiation Oncology | Admitting: Radiation Oncology

## 2023-03-17 VITALS — BP 112/59 | HR 78 | Temp 98.4°F | Resp 18 | Ht 63.0 in | Wt 149.0 lb

## 2023-03-17 DIAGNOSIS — Z923 Personal history of irradiation: Secondary | ICD-10-CM | POA: Insufficient documentation

## 2023-03-17 DIAGNOSIS — C3432 Malignant neoplasm of lower lobe, left bronchus or lung: Secondary | ICD-10-CM | POA: Insufficient documentation

## 2023-03-17 DIAGNOSIS — C3431 Malignant neoplasm of lower lobe, right bronchus or lung: Secondary | ICD-10-CM | POA: Diagnosis present

## 2023-03-17 DIAGNOSIS — C3491 Malignant neoplasm of unspecified part of right bronchus or lung: Secondary | ICD-10-CM

## 2023-03-17 NOTE — Progress Notes (Signed)
Radiation Oncology         [phone] ________________________________  Name: Karen [name] MRN: [id number]  Date: 03/17/2023  DOB: [birth year]  Follow-Up Visit Note  CC: [facility]  Si Gaul, MD    ICD-10-CM   1. Adenocarcinoma of right lung (HCC)  C34.91       Diagnosis: Adenocarcinoma of LLL diagnosed in 2022, with additional ground-glass nodule in the posterior right lower lobe suspicious for bronchogenic neoplasm such as noninvasive adenocarcinoma -- Now with an enlarging posterior RLL nodule concerning for malignancy.      Interval Since Last Radiation: 1 month and 2 days   Indication for treatment: Curative         Radiation treatment dates: First Treatment Date: 2023-02-04 - Last Treatment Date: 2023-02-12    Site/Dose/Technique/Mode:    Site: Lung, Right Technique: SBRT/SRT-IMRT Mode: Photon Dose Per Fraction: 18 Gy Prescribed Dose (Delivered / Prescribed): 54 Gy / 54 Gy Prescribed Fxs (Delivered / Prescribed): 3 / 3  Narrative:  The patient returns today for routine follow-up. She tolerated radiation treatment relatively well. On the date of her final treatment, she endorsed fatigue, shortness of breath, cough, dysphagia, and some headaches. She denied any other concerns during treatment.   No other significant interval history since she completed radiation therapy or in the interval since she was seen in re-consultation. She continues to follow with Dr. Delton Coombes for her COPD. Per Dr. Delton Coombes, she does have some issues with confusion regarding her medications (Stiolto and albuterol).   On evaluation today she denies any significant cough changes in her breathing or hemoptysis.  She reports she will be undergoing an endoscopy to evaluate her chronic swallowing issues.                            Allergies:  is allergic to codeine and novocain [procaine].  Meds: Current Outpatient Medications  Medication Sig Dispense Refill   acetaminophen  (TYLENOL) 500 MG tablet Take 500 mg by mouth every 6 (six) hours as needed for headache.     albuterol (VENTOLIN HFA) 108 (90 Base) MCG/ACT inhaler Inhale 2 puffs into the lungs every 6 (six) hours as needed for wheezing or shortness of breath. 54 g 3   amLODipine (NORVASC) 5 MG tablet Take 1 tablet (5 mg total) by mouth daily. 90 tablet 3   clopidogrel (PLAVIX) 75 MG tablet Take 1 tablet (75 mg total) by mouth daily. 90 tablet 3   cyanocobalamin (VITAMIN B12) 1000 MCG tablet Take 1 tablet (1,000 mcg total) by mouth daily. 30 tablet 0   cyclobenzaprine (FLEXERIL) 10 MG tablet Take 10 mg by mouth 3 (three) times daily as needed for muscle spasms.     furosemide (LASIX) 40 MG tablet Take 1 tablet (40 mg total) by mouth daily. 90 tablet 3   isosorbide mononitrate (IMDUR) 60 MG 24 hr tablet Take 1 tablet (60 mg total) by mouth 2 (two) times daily. 180 tablet 3   lisinopril (ZESTRIL) 10 MG tablet Take 1 tablet (10 mg total) by mouth daily. 90 tablet 3   metoprolol tartrate (LOPRESSOR) 25 MG tablet Take 0.5 tablets (12.5 mg total) by mouth 2 (two) times daily. 90 tablet 3   naloxone (NARCAN) nasal spray 4 mg/0.1 mL SMARTSIG:Both Nares     nitroGLYCERIN (NITROSTAT) 0.4 MG SL tablet Place 1 tablet (0.4 mg total) under the tongue every 5 (five) minutes as needed for chest pain (X3 DOSES  MAX). 25 tablet 11   oxyCODONE-acetaminophen (PERCOCET) 7.5-325 MG tablet Take 1-2 tablets by mouth every 4 (four) hours as needed for moderate pain (back pain).     pantoprazole (PROTONIX) 40 MG tablet Take 1 tablet (40 mg total) by mouth daily. 30 tablet 0   polyethylene glycol (MIRALAX / GLYCOLAX) 17 g packet Take 17 g by mouth daily. 14 each 0   ranolazine (RANEXA) 500 MG 12 hr tablet Take 1 tablet (500 mg total) by mouth 2 (two) times daily. 180 tablet 3   rosuvastatin (CRESTOR) 20 MG tablet Take 1 tablet (20 mg total) by mouth daily. 90 tablet 3   Tiotropium Bromide-Olodaterol (STIOLTO RESPIMAT) 2.5-2.5 MCG/ACT AERS  Inhale 2 puffs into the lungs daily. 4 g 11   No current facility-administered medications for this encounter.    Physical Findings: The patient is in no acute distress. Patient is alert and oriented.  height is 5\' 3"  (1.6 m) and weight is 149 lb (67.6 kg). Her oral temperature is 98.4 F (36.9 C). Her blood pressure is 112/59 (abnormal). .  . Lungs are clear to auscultation bilaterally. Heart has regular rate and rhythm. No palpable cervical, supraclavicular, or axillary adenopathy. Abdomen soft, non-tender, normal bowel sounds.   Lab Findings: Lab Results  Component Value Date   WBC 3.7 (L) 01/03/2023   HGB 13.2 01/03/2023   HCT 39.0 01/03/2023   MCV 95.6 01/03/2023   PLT 187 01/03/2023    Radiographic Findings: No results found.  Impression: Adenocarcinoma of LLL diagnosed in 2022, with additional ground-glass nodule in the posterior right lower lobe suspicious for bronchogenic neoplasm such as noninvasive adenocarcinoma -- Now with an enlarging posterior RLL nodule concerning for malignancy.      She has recovered well from her SBRT.  No evidence of recurrence on clinical exam today.  Plan: As needed follow-up in radiation oncology.  She is scheduled to follow-up with Dr. Arbutus Ped in January 2025 after her chest CT scan to follow-up on her SBRT.   _____________________  Billie Lade, PhD, MD  This document serves as a record of services personally performed by Antony Blackbird, MD. It was created on his behalf by Neena Rhymes, a trained medical scribe. The creation of this record is based on the scribe's personal observations and the provider's statements to them. This document has been checked and approved by the attending provider.

## 2023-03-17 NOTE — Progress Notes (Signed)
  Radiation Oncology         [phone] ________________________________  Name: Karen [name] MRN: [id number]  Date: 03/17/2023  DOB: 02/03/1946  End of Treatment Note  Diagnosis: The primary encounter diagnosis was Malignant neoplasm of lower lobe of right lung (HCC). A diagnosis of Adenocarcinoma of lung, unspecified laterality (HCC) was also pertinent to this visit.   Adenocarcinoma of LLL diagnosed in 2022, with additional ground-glass nodule in the posterior right lower lobe suspicious for bronchogenic neoplasm such as noninvasive adenocarcinoma -- Now with an enlarging posterior RLL nodule concerning for malignancy.      Indication for treatment: Curative        Radiation treatment dates: First Treatment Date: 2023-02-04 - Last Treatment Date: 2023-02-12   Site/Dose/Technique/Mode:   Site: Lung, Right Technique: SBRT/SRT-IMRT Mode: Photon Dose Per Fraction: 18 Gy Prescribed Dose (Delivered / Prescribed): 54 Gy / 54 Gy Prescribed Fxs (Delivered / Prescribed): 3 / 3  Narrative: The patient tolerated radiation treatment relatively well. On the date of her final treatment, she endorsed fatigue, shortness of breath, cough, dysphagia, and some headaches. She denied any other concerns during treatment.   Plan: The patient has completed radiation treatment. The patient will return to radiation oncology clinic for routine followup in one month. I advised them to call or return sooner if they have any questions or concerns related to their recovery or treatment.  -----------------------------------  Billie Lade, PhD, MD  This document serves as a record of services personally performed by Antony Blackbird, MD. It was created on his behalf by Neena Rhymes, a trained medical scribe. The creation of this record is based on the scribe's personal observations and the provider's statements to them. This document has been checked and approved by the attending provider.

## 2023-03-17 NOTE — Progress Notes (Signed)
Karen [name] is here today for follow up post radiation to the lung.  Lung Side: Right  Does the patient complain of any of the following: Pain:Yes, on the right side near ribcage Shortness of breath w/wo exertion: No Cough: She reports coughing more at bedtime. Hemoptysis: No Pain with swallowing: No Swallowing/choking concerns: No Appetite: Fair Energy Level: Fair Post radiation skin Changes: No changes reported to chest but she noticed her right arm appeared to be darker.  BP (!) 112/59 (BP Location: Left Arm, Patient Position: Sitting, Cuff Size: Normal)   Temp 98.4 F (36.9 C) (Oral)   Ht 5\' 3"  (1.6 m)   Wt 149 lb (67.6 kg)   BMI 26.39 kg/m      Additional comments if applicable:

## 2023-04-14 ENCOUNTER — Ambulatory Visit: Payer: 59 | Admitting: Adult Health

## 2023-04-14 ENCOUNTER — Encounter: Payer: Self-pay | Admitting: Adult Health

## 2023-04-14 VITALS — BP 104/66 | HR 72 | Temp 98.9°F | Ht 63.0 in | Wt 150.2 lb

## 2023-04-14 DIAGNOSIS — C3491 Malignant neoplasm of unspecified part of right bronchus or lung: Secondary | ICD-10-CM

## 2023-04-14 DIAGNOSIS — C3492 Malignant neoplasm of unspecified part of left bronchus or lung: Secondary | ICD-10-CM

## 2023-04-14 DIAGNOSIS — J449 Chronic obstructive pulmonary disease, unspecified: Secondary | ICD-10-CM | POA: Diagnosis not present

## 2023-04-14 NOTE — Patient Instructions (Addendum)
CT chest next month as planned by Oncology.  Continue on Stiolto 2 puffs daily  Albuterol inhaler As needed   Activity as tolerated.  RSV vaccine is recommended, can get at your pharmacy.  Follow up with Oncology as planned.  Follow up with Dr. Delton Coombes  in 4 months and As needed

## 2023-04-14 NOTE — Progress Notes (Signed)
@Patient  ID: Karen [name], female    DOB: 06/16/1945, 77 y.o.   MRN: [id number]  Chief Complaint  Patient presents with   Follow-up  Discussed the use of AI scribe software for clinical note transcription with the patient, who gave verbal consent to proceed.   Referring provider: [facility]  HPI: 77 year old female former smoker followed for COPD.  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  Enlarging right lower lobe lung nodule status post SBRT October 2024.  TEST/EVENTS :  CT chest 07/01/2022 shows atelectatic change along the medial right lung base that is unchanged. Slight increase in size in an area of posterior right lower lobe groundglass now 2.3 cm. Left lung with a tiny effusion, more confluent superior segmental left lower lobe opacity now 3.9 cm   PET scan 01/2022 showed some mild hypermetabolism in the treated left lower lobe superior segmental nodule, no hypermetabolism in other nodules   CT chest January 03, 2023 showed 2.7 cm right lower lobe pulmonary nodule with a 1.2 cm solid component increased in size.,  Involving postradiation changes in the left lower lobe.   04/14/2023 Follow up : COPD, lung cancer history, lung nodules Patient presents for a 10-month follow-up.  The patient reports feeling "pretty good" overall.  Says she has some intermittent coughing and wheezing, particularly at night when lying down. Despite these symptoms, the patient remains relatively active, managing light house chores and cooking on occasion. She remains on Stiolto daily.  Denies any increased albuterol use.  Patient has a history of left lower lobe adenocarcinoma diagnosed in 2022.  She underwent SBRT.  Serial CT follow-up has showed an enlarging right lower lobe lung nodule.  She was seen by radiation oncology recently and underwent SBRT in October.  She has a follow-up CT chest next month.  She says she tolerated the  radiation without any known difficulties.          Allergies  Allergen Reactions   Codeine Itching and Swelling   Novocain [Procaine] Hives and Swelling    Eyes swelling    Immunization History  Administered Date(s) Administered   Fluad Quad(high Dose 65+) 01/02/2021   Influenza Split 11/06/2021   Influenza, High Dose Seasonal PF 04/17/2018, 02/04/2019   Influenza-Unspecified 02/12/2023   Pneumococcal Conjugate-13 02/04/2019    Past Medical History:  Diagnosis Date   Acute myocardial infarction, unspecified site, episode of care unspecified    Adenocarcinoma, lung (HCC) 02/16/2021   Allergic rhinitis, cause unspecified    CAD (coronary artery disease)    GERD (gastroesophageal reflux disease)    Headache(784.0)    History of radiation therapy    left lung SBRT 02/08/2021, 02/13/2021, 02/15/2021  Dr Antony Blackbird   History of radiation therapy    RIght lung- 02/04/23-02/12/23-Dr. Antony Blackbird   HTN (hypertension)    Other and unspecified hyperlipidemia    Other diseases of lung, not elsewhere classified    Postsurgical aortocoronary bypass status    hx of it.     Tobacco History: Social History   Tobacco Use  Smoking Status Former   Current packs/day: 0.00   Average packs/day: 0.5 packs/day for 56.1 years (28.1 ttl pk-yrs)   Types: Cigarettes   Start date: 46   Quit date: 06/05/2022   Years since quitting: 0.8  Smokeless Tobacco Never  Tobacco Comments   Smokes 1 cigarette every once in a while ARJ 08/22/22   Counseling given: Not Answered Tobacco  comments: Smokes 1 cigarette every once in a while ARJ 08/22/22   Outpatient Medications Prior to Visit  Medication Sig Dispense Refill   acetaminophen (TYLENOL) 500 MG tablet Take 500 mg by mouth every 6 (six) hours as needed for headache.     albuterol (VENTOLIN HFA) 108 (90 Base) MCG/ACT inhaler Inhale 2 puffs into the lungs every 6 (six) hours as needed for wheezing or shortness of breath. 54 g 3    amLODipine (NORVASC) 5 MG tablet Take 1 tablet (5 mg total) by mouth daily. 90 tablet 3   clopidogrel (PLAVIX) 75 MG tablet Take 1 tablet (75 mg total) by mouth daily. 90 tablet 3   cyanocobalamin (VITAMIN B12) 1000 MCG tablet Take 1 tablet (1,000 mcg total) by mouth daily. 30 tablet 0   cyclobenzaprine (FLEXERIL) 10 MG tablet Take 10 mg by mouth 3 (three) times daily as needed for muscle spasms.     furosemide (LASIX) 40 MG tablet Take 1 tablet (40 mg total) by mouth daily. 90 tablet 3   isosorbide mononitrate (IMDUR) 60 MG 24 hr tablet Take 1 tablet (60 mg total) by mouth 2 (two) times daily. 180 tablet 3   lisinopril (ZESTRIL) 10 MG tablet Take 1 tablet (10 mg total) by mouth daily. 90 tablet 3   metoprolol tartrate (LOPRESSOR) 25 MG tablet Take 0.5 tablets (12.5 mg total) by mouth 2 (two) times daily. 90 tablet 3   naloxone (NARCAN) nasal spray 4 mg/0.1 mL SMARTSIG:Both Nares     nitroGLYCERIN (NITROSTAT) 0.4 MG SL tablet Place 1 tablet (0.4 mg total) under the tongue every 5 (five) minutes as needed for chest pain (X3 DOSES MAX). 25 tablet 11   oxyCODONE-acetaminophen (PERCOCET) 7.5-325 MG tablet Take 1-2 tablets by mouth every 4 (four) hours as needed for moderate pain (back pain).     polyethylene glycol (MIRALAX / GLYCOLAX) 17 g packet Take 17 g by mouth daily. 14 each 0   ranolazine (RANEXA) 500 MG 12 hr tablet Take 1 tablet (500 mg total) by mouth 2 (two) times daily. 180 tablet 3   rosuvastatin (CRESTOR) 20 MG tablet Take 1 tablet (20 mg total) by mouth daily. 90 tablet 3   Tiotropium Bromide-Olodaterol (STIOLTO RESPIMAT) 2.5-2.5 MCG/ACT AERS Inhale 2 puffs into the lungs daily. 4 g 11   pantoprazole (PROTONIX) 40 MG tablet Take 1 tablet (40 mg total) by mouth daily. 30 tablet 0   No facility-administered medications prior to visit.     Review of Systems:   Constitutional:   No  weight loss, night sweats,  Fevers, chills, +fatigue, or  lassitude.  HEENT:   No headaches,   Difficulty swallowing,  Tooth/dental problems, or  Sore throat,                No sneezing, itching, ear ache, nasal congestion, post nasal drip,   CV:  No chest pain,  Orthopnea, PND, swelling in lower extremities, anasarca, dizziness, palpitations, syncope.   GI  No heartburn, indigestion, abdominal pain, nausea, vomiting, diarrhea, change in bowel habits, loss of appetite, bloody stools.   Resp:.  No chest wall deformity  Skin: no rash or lesions.  GU: no dysuria, change in color of urine, no urgency or frequency.  No flank pain, no hematuria   MS:  No joint pain or swelling.  No decreased range of motion.  No back pain.    Physical Exam  BP 104/66 (BP Location: Left Arm, Patient Position: Sitting, Cuff Size: Small)  Pulse 72   Temp 98.9 F (37.2 C) (Oral)   Ht 5\' 3"  (1.6 m)   Wt 150 lb 3.2 oz (68.1 kg)   SpO2 95%   BMI 26.61 kg/m   GEN: A/Ox3; pleasant , NAD, well nourished    HEENT:  [place]/AT,  EACs-clear, TMs-wnl, NOSE-clear, THROAT-clear, no lesions, no postnasal drip or exudate noted.   NECK:  Supple w/ fair ROM; no JVD; normal carotid impulses w/o bruits; no thyromegaly or nodules palpated; no lymphadenopathy.    RESP  Clear  P & A; w/o, wheezes/ rales/ or rhonchi. no accessory muscle use, no dullness to percussion  CARD:  RRR, no m/r/g, no peripheral edema, pulses intact, no cyanosis or clubbing.  GI:   Soft & nt; nml bowel sounds; no organomegaly or masses detected.   Musco: Warm bil, no deformities or joint swelling noted.   Neuro: alert, no focal deficits noted.    Skin: Warm, no lesions or rashes    Lab Results:  CBC    BNP No results found for: "BNP"  ProBNP Imaging: No results found.  Administration History     None          Latest Ref Rng & Units 04/06/2021    8:57 AM  PFT Results  FVC-Pre L 1.77   FVC-Predicted Pre % 83   FVC-Post L 1.70   FVC-Predicted Post % 79   Pre FEV1/FVC % % 72   Post FEV1/FCV % % 69   FEV1-Pre L  1.28   FEV1-Predicted Pre % 77   FEV1-Post L 1.17   DLCO uncorrected ml/min/mmHg 13.11   DLCO UNC% % 70   DLCO corrected ml/min/mmHg 13.69   DLCO COR %Predicted % 73   DLVA Predicted % 110   TLC L 3.68   TLC % Predicted % 75   RV % Predicted % 82     No results found for: "NITRICOXIDE"      Assessment & Plan:  Assessment and Plan    Chronic Obstructive Pulmonary Disease (COPD)  -appears stable. She is on Stiolto, taking two puffs daily, and uses an albuterol inhaler as needed, reporting intermittent use. we discussed the importance of adhering to her inhaler regimen and monitoring symptoms. We will continue Stiolto two puffs daily and use the albuterol inhaler as needed. We encourage activity as tolerated.  Lung Cancer  - Continue follow-up with oncology and radiation oncology. CT chest in January as planned.  General Health Maintenance   Her flu and pneumonia vaccinations are up to date. We recommended the RSV vaccine Thurmond Butts) for her age group and discussed the benefits of the RSV vaccine in preventing respiratory infections.   Follow-up   She will follow up with Dr. Delton Coombes in four months.        Karen Oaks, NP 04/14/2023

## 2023-04-24 ENCOUNTER — Ambulatory Visit (INDEPENDENT_AMBULATORY_CARE_PROVIDER_SITE_OTHER): Payer: 59

## 2023-04-24 DIAGNOSIS — I4901 Ventricular fibrillation: Secondary | ICD-10-CM

## 2023-04-24 LAB — CUP PACEART REMOTE DEVICE CHECK
Battery Remaining Longevity: 42 mo
Battery Remaining Percentage: 48 %
Brady Statistic RV Percent Paced: 0 %
Date Time Interrogation Session: 20241226104500
HighPow Impedance: 72 Ohm
Implantable Lead Connection Status: 753985
Implantable Lead Implant Date: 20071205
Implantable Lead Location: 753860
Implantable Lead Model: 137
Implantable Lead Serial Number: 102363
Implantable Pulse Generator Implant Date: 20141105
Lead Channel Impedance Value: 473 Ohm
Lead Channel Pacing Threshold Amplitude: 0.8 V
Lead Channel Pacing Threshold Pulse Width: 0.5 ms
Lead Channel Setting Pacing Amplitude: 2 V
Lead Channel Setting Pacing Pulse Width: 0.5 ms
Lead Channel Setting Sensing Sensitivity: 0.6 mV
Pulse Gen Serial Number: 126146

## 2023-05-02 ENCOUNTER — Encounter: Payer: Self-pay | Admitting: Internal Medicine

## 2023-05-02 ENCOUNTER — Ambulatory Visit: Payer: 59 | Attending: Internal Medicine | Admitting: Internal Medicine

## 2023-05-02 VITALS — BP 112/76 | HR 76 | Ht 63.0 in | Wt 146.6 lb

## 2023-05-02 DIAGNOSIS — Z95810 Presence of automatic (implantable) cardiac defibrillator: Secondary | ICD-10-CM

## 2023-05-02 DIAGNOSIS — I4729 Other ventricular tachycardia: Secondary | ICD-10-CM | POA: Insufficient documentation

## 2023-05-02 LAB — CUP PACEART INCLINIC DEVICE CHECK
Date Time Interrogation Session: 20250103133142
HighPow Impedance: 69 Ohm
Implantable Lead Connection Status: 753985
Implantable Lead Implant Date: 20071205
Implantable Lead Location: 753860
Implantable Lead Model: 137
Implantable Lead Serial Number: 102363
Implantable Pulse Generator Implant Date: 20141105
Lead Channel Impedance Value: 492 Ohm
Lead Channel Pacing Threshold Amplitude: 0.5 V
Lead Channel Pacing Threshold Pulse Width: 0.5 ms
Lead Channel Sensing Intrinsic Amplitude: 6.4 mV
Lead Channel Setting Pacing Amplitude: 2 V
Lead Channel Setting Pacing Pulse Width: 0.5 ms
Lead Channel Setting Sensing Sensitivity: 0.6 mV
Pulse Gen Serial Number: 126146

## 2023-05-02 NOTE — Progress Notes (Signed)
 Patient Care Team: [facility] as PCP - General Dann Candyce RAMAN, MD as PCP - Cardiology (Cardiology) Fernande Elspeth BROCKS, MD as PCP - Electrophysiology (Cardiology)   HPI  Karen [name] is a 78 y.o. female Seen in followup for ICD implantation for primary prevention in the setting of ischemic heart disease.  Initially implanted 2007 with generator replacement in 2014.  This was done  in the context of an EP study >>inducible ventricular arrhythmias.   Subsequently, she presented with acute MI complicated by ventricular fibrillation and underwent left main stenting followed by emergency bypass surgery done by Dr. ARMOND  2012  Interval hospitalization 1/24 with inferolateral MI with DES stenting and recurrent stenting 05/11/2022 this having occurred just folowing presentation with lower GI bleed, holding of plavix  and embolization  Also with adenocarcinoma of the lung for which she has been undergoing radiation therapy    Modest shortness of breath  She continues to care for her disabled son.  Raymond -- Alone.  His kids are uninvolved     DATE TEST EF   6/15 MYOVIEW     % No ischemia  10/17 Echo   60-65 %   12/23 Echo  50-55%   1/24 LHC  LIMA-LAD atretic SVG-OM; SVD D occluded SVG-RCA >>>ISR>>stented   Date Cr K Hgb  4/21 1.17 4.2 12.9  10/23 1.31 4.2 12.9  9/24 0.1 3.6 13.2        Records and Results [facility]   Past Medical History:  Diagnosis Date   Acute myocardial infarction, unspecified site, episode of care unspecified    Adenocarcinoma, lung (HCC) 02/16/2021   Allergic rhinitis, cause unspecified    CAD (coronary artery disease)    GERD (gastroesophageal reflux disease)    Headache(784.0)    History of radiation therapy    left lung SBRT 02/08/2021, 02/13/2021, 02/15/2021  Dr Lynwood Nasuti   History of radiation therapy    RIght lung- 02/04/23-02/12/23-Dr. Lynwood Nasuti   HTN (hypertension)    Other and unspecified hyperlipidemia    Other  diseases of lung, not elsewhere classified    Postsurgical aortocoronary bypass status    hx of it.     Past Surgical History:  Procedure Laterality Date   CARDIAC CATHETERIZATION     CARDIAC DEFIBRILLATOR PLACEMENT  03/29/2006   [employer]. remote-yes.    CORONARY ARTERY BYPASS GRAFT     x4   CORONARY STENT INTERVENTION N/A 05/11/2022   Procedure: CORONARY STENT INTERVENTION;  Surgeon: Wonda Sharper, MD;  Location: [facility] INVASIVE CV LAB;  Service: Cardiovascular;  Laterality: N/A;   CORONARY/GRAFT ACUTE MI REVASCULARIZATION N/A 05/08/2022   Procedure: Coronary/Graft Acute MI Revascularization;  Surgeon: Burnard Debby LABOR, MD;  Location: [facility] INVASIVE CV LAB;  Service: Cardiovascular;  Laterality: N/A;   IMPLANTABLE CARDIOVERTER DEFIBRILLATOR GENERATOR CHANGE N/A 03/03/2013   Procedure: IMPLANTABLE CARDIOVERTER DEFIBRILLATOR GENERATOR CHANGE;  Surgeon: Elspeth BROCKS Fernande, MD;  Location: [facility] CATH LAB;  Service: Cardiovascular;  Laterality: N/A;   IR ANGIOGRAM VISCERAL SELECTIVE  05/06/2022   IR AORTAGRAM ABDOMINAL SERIALOGRAM  05/06/2022   IR EMBO ART  VEN HEMORR LYMPH EXTRAV  INC GUIDE ROADMAPPING  05/06/2022   IR US  GUIDE VASC ACCESS RIGHT  05/06/2022   LEFT HEART CATH AND CORONARY ANGIOGRAPHY N/A 05/08/2022   Procedure: LEFT HEART CATH AND CORONARY ANGIOGRAPHY;  Surgeon: Burnard Debby LABOR, MD;  Location: [facility] INVASIVE CV LAB;  Service: Cardiovascular;  Laterality: N/A;   LEFT HEART CATH AND CORS/GRAFTS ANGIOGRAPHY N/A  05/11/2022   Procedure: LEFT HEART CATH AND CORS/GRAFTS ANGIOGRAPHY;  Surgeon: Wonda Sharper, MD;  Location: [facility] INVASIVE CV LAB;  Service: Cardiovascular;  Laterality: N/A;   stent implant     x1. possibly x2.     Current Outpatient Medications  Medication Sig Dispense Refill   acetaminophen  (TYLENOL ) 500 MG tablet Take 500 mg by mouth every 6 (six) hours as needed for headache.     albuterol  (VENTOLIN  HFA) 108 (90 Base) MCG/ACT inhaler Inhale 2 puffs into the lungs every 6 (six)  hours as needed for wheezing or shortness of breath. 54 g 3   amLODipine  (NORVASC ) 5 MG tablet Take 1 tablet (5 mg total) by mouth daily. 90 tablet 3   clopidogrel  (PLAVIX ) 75 MG tablet Take 1 tablet (75 mg total) by mouth daily. 90 tablet 3   cyanocobalamin  (VITAMIN B12) 1000 MCG tablet Take 1 tablet (1,000 mcg total) by mouth daily. 30 tablet 0   cyclobenzaprine (FLEXERIL) 10 MG tablet Take 10 mg by mouth 3 (three) times daily as needed for muscle spasms.     famotidine  (PEPCID ) 20 MG tablet Take by mouth. daily     furosemide  (LASIX ) 40 MG tablet Take 1 tablet (40 mg total) by mouth daily. 90 tablet 3   isosorbide  mononitrate (IMDUR ) 60 MG 24 hr tablet Take 1 tablet (60 mg total) by mouth 2 (two) times daily. 180 tablet 3   lisinopril  (ZESTRIL ) 10 MG tablet Take 1 tablet (10 mg total) by mouth daily. 90 tablet 3   metoprolol  tartrate (LOPRESSOR ) 25 MG tablet Take 0.5 tablets (12.5 mg total) by mouth 2 (two) times daily. 90 tablet 3   naloxone (NARCAN) nasal spray 4 mg/0.1 mL SMARTSIG:Both Nares     nitroGLYCERIN  (NITROSTAT ) 0.4 MG SL tablet Place 1 tablet (0.4 mg total) under the tongue every 5 (five) minutes as needed for chest pain (X3 DOSES MAX). 25 tablet 11   oxyCODONE -acetaminophen  (PERCOCET) 7.5-325 MG tablet Take 1-2 tablets by mouth every 4 (four) hours as needed for moderate pain (back pain).     polyethylene glycol (MIRALAX  / GLYCOLAX ) 17 g packet Take 17 g by mouth daily. 14 each 0   ranolazine  (RANEXA ) 500 MG 12 hr tablet Take 1 tablet (500 mg total) by mouth 2 (two) times daily. 180 tablet 3   rosuvastatin  (CRESTOR ) 20 MG tablet Take 1 tablet (20 mg total) by mouth daily. 90 tablet 3   Tiotropium Bromide -Olodaterol (STIOLTO RESPIMAT ) 2.5-2.5 MCG/ACT AERS Inhale 2 puffs into the lungs daily. 4 g 11   VITAMIN D, ERGOCALCIFEROL, PO Take by mouth. weekly     No current facility-administered medications for this visit.    Allergies  Allergen Reactions   Codeine Itching and  Swelling   Novocain [Procaine] Hives and Swelling    Eyes swelling      Review of Systems negative except from HPI and PMH  Physical Exam BP 112/76   Pulse 76   Ht 5' 3 (1.6 m)   Wt 146 lb 9.6 oz (66.5 kg)   SpO2 99%   BMI 25.97 kg/m  Well developed and well nourished in no acute distress HENT normal Neck supple with JVP-flat Clear Device pocket well healed; without hematoma or erythema.  There is no tethering  Regular rate and rhythm, no  gallop No murmur Abd-soft with active BS No Clubbing cyanosis  edema Skin-warm and dry A & Oriented  Grossly normal sensory and motor function  ECG sinus at 76 Intervals 15/08/41  Device function is normal. Programming changes nobne  See Paceart for details    Assessment and  Plan  Ischemic heart disease with prior CABG and normal LV function   Dyspnea on exertion  Hypertension  VT NS  Implantable defibrillator  [employer]     No interval SVT  Dyspnea multifactorial

## 2023-05-02 NOTE — Patient Instructions (Addendum)
Medication Instructions:  Your physician recommends that you continue on your current medications as directed. Please refer to the Current Medication list given to you today.  *If you need a refill on your cardiac medications before your next appointment, please call your pharmacy*   Lab Work: None ordered.  If you have labs (blood work) drawn today and your tests are completely normal, you will receive your results only by: [place] (if you have MyChart) OR A paper copy in the mail If you have any lab test that is abnormal or we need to change your treatment, we will call you to review the results.   Testing/Procedures: None ordered.    Follow-Up: At [facility], you and your health needs are our priority.  As part of our continuing mission to provide you with exceptional heart care, we have created designated Provider Care Teams.  These Care Teams include your primary Cardiologist (physician) and Advanced Practice Providers (APPs -  Physician Assistants and Nurse Practitioners) who all work together to provide you with the care you need, when you need it.  We recommend signing up for the patient portal called "MyChart".  Sign up information is provided on this After Visit Summary.  MyChart is used to connect with patients for Virtual Visits (Telemedicine).  Patients are able to view lab/test results, encounter notes, upcoming appointments, etc.  Non-urgent messages can be sent to your provider as well.   To learn more about what you can do with MyChart, go to [email or web address].    Your next appointment:   12 months with Dr Olin Pia PA

## 2023-05-06 ENCOUNTER — Other Ambulatory Visit: Payer: Self-pay | Admitting: Interventional Cardiology

## 2023-05-06 ENCOUNTER — Ambulatory Visit (HOSPITAL_COMMUNITY)
Admission: RE | Admit: 2023-05-06 | Discharge: 2023-05-06 | Disposition: A | Discharge status: Home / Self Care | Payer: 59 | Source: Ambulatory Visit | Attending: Internal Medicine | Admitting: Internal Medicine

## 2023-05-06 ENCOUNTER — Inpatient Hospital Stay: Payer: 59 | Attending: Internal Medicine

## 2023-05-06 DIAGNOSIS — C3490 Malignant neoplasm of unspecified part of unspecified bronchus or lung: Secondary | ICD-10-CM | POA: Insufficient documentation

## 2023-05-06 DIAGNOSIS — C3431 Malignant neoplasm of lower lobe, right bronchus or lung: Secondary | ICD-10-CM | POA: Diagnosis present

## 2023-05-06 LAB — CMP (CANCER CENTER ONLY)
ALT: 13 U/L (ref 0–44)
AST: 16 U/L (ref 15–41)
Albumin: 4.4 g/dL (ref 3.5–5.0)
Alkaline Phosphatase: 68 U/L (ref 38–126)
Anion gap: 7 (ref 5–15)
BUN: 21 mg/dL (ref 8–23)
CO2: 29 mmol/L (ref 22–32)
Calcium: 9.8 mg/dL (ref 8.9–10.3)
Chloride: 104 mmol/L (ref 98–111)
Creatinine: 1.42 mg/dL — ABNORMAL HIGH (ref 0.44–1.00)
GFR, Estimated: 38 mL/min — ABNORMAL LOW (ref >60)
Glucose, Bld: 96 mg/dL (ref 70–99)
Potassium: 4 mmol/L (ref 3.5–5.1)
Sodium: 140 mmol/L (ref 135–145)
Total Bilirubin: 0.5 mg/dL (ref 0.0–1.2)
Total Protein: 7.2 g/dL (ref 6.5–8.1)

## 2023-05-06 LAB — CBC WITH DIFFERENTIAL (CANCER CENTER ONLY)
Abs Immature Granulocytes: 0.01 10*3/uL (ref 0.00–0.07)
Basophils Absolute: 0 10*3/uL (ref 0.0–0.1)
Basophils Relative: 0 %
Eosinophils Absolute: 0.1 10*3/uL (ref 0.0–0.5)
Eosinophils Relative: 2 %
HCT: 38.2 % (ref 36.0–46.0)
Hemoglobin: 13 g/dL (ref 12.0–15.0)
Immature Granulocytes: 0 %
Lymphocytes Relative: 44 %
Lymphs Abs: 1.7 10*3/uL (ref 0.7–4.0)
MCH: 32.2 pg (ref 26.0–34.0)
MCHC: 34 g/dL (ref 30.0–36.0)
MCV: 94.6 fL (ref 80.0–100.0)
Monocytes Absolute: 0.3 10*3/uL (ref 0.1–1.0)
Monocytes Relative: 7 %
Neutro Abs: 1.9 10*3/uL (ref 1.7–7.7)
Neutrophils Relative %: 47 %
Platelet Count: 187 10*3/uL (ref 150–400)
RBC: 4.04 MIL/uL (ref 3.87–5.11)
RDW: 12 % (ref 11.5–15.5)
WBC Count: 4 10*3/uL (ref 4.0–10.5)
nRBC: 0 % (ref 0.0–0.2)

## 2023-05-06 MED ORDER — IOHEXOL 300 MG/ML  SOLN
60.0000 mL | Freq: Once | INTRAMUSCULAR | Status: AC | PRN
  Administered 2023-05-06: 60 mL via INTRAVENOUS

## 2023-05-14 ENCOUNTER — Inpatient Hospital Stay (HOSPITAL_BASED_OUTPATIENT_CLINIC_OR_DEPARTMENT_OTHER): Payer: 59 | Admitting: Internal Medicine

## 2023-05-14 VITALS — BP 113/63 | HR 78 | Temp 97.3°F | Resp 15 | Ht 63.0 in | Wt 148.1 lb

## 2023-05-14 DIAGNOSIS — C3490 Malignant neoplasm of unspecified part of unspecified bronchus or lung: Secondary | ICD-10-CM | POA: Diagnosis not present

## 2023-05-14 DIAGNOSIS — C3431 Malignant neoplasm of lower lobe, right bronchus or lung: Secondary | ICD-10-CM | POA: Diagnosis not present

## 2023-05-14 NOTE — Progress Notes (Signed)
 [facility] [phone]   [phone]  OFFICE PROGRESS NOTE  Center, [facility] [address] [place] [address]  DIAGNOSIS: Stage IB (T1b, N0, M0) non-small cell lung cancer, adenocarcinoma presented with left lower lobe lung nodule diagnosed in September 2022.   PRIOR THERAPY: SBRT under the care of Dr. Eloise Hake. Last treatment on 02/15/21.    CURRENT THERAPY: Observation   INTERVAL HISTORY: Karen [name] 78 y.o. female returns to the clinic today for follow-up visit.Discussed the use of AI scribe software for clinical note transcription with the patient, who gave verbal consent to proceed.  History of Present Illness   Karen [name], a 78 year old patient with a history of stage 1B non-small cell lung cancer adenocarcinoma, underwent SBRT for a left lower lobe nodule in October 2022. A few months ago, a new mass nodule was discovered in the right lower lobe, for which she received radiation therapy, completed in October 2024.  Since the completion of the second round of radiation, the patient reports variable daily wellbeing. She experiences shortness of breath, particularly noticeable during physical activities such as walking or cooking. Accompanying this is a cough, predominantly dry, which worsens at night. No cough suppressants or remedies are currently being used. The patient denies hemoptysis.  She also expresses concerns about weight loss, although her weight has remained stable since early January. The patient denies experiencing fever or chills, but reports frequently feeling cold.  The patient's son has taken on the role of caregiver, assisting with appointments and daily care due to the patient's self-reported memory issues.       MEDICAL HISTORY: Past Medical History:  Diagnosis Date   Acute myocardial infarction, unspecified site, episode of care unspecified    Adenocarcinoma, lung (HCC) 02/16/2021    Allergic rhinitis, cause unspecified    CAD (coronary artery disease)    GERD (gastroesophageal reflux disease)    Headache(784.0)    History of radiation therapy    left lung SBRT 02/08/2021, 02/13/2021, 02/15/2021  Dr Retta Caster   History of radiation therapy    RIght lung- 02/04/23-02/12/23-Dr. Retta Caster   HTN (hypertension)    Other and unspecified hyperlipidemia    Other diseases of lung, not elsewhere classified    Postsurgical aortocoronary bypass status    hx of it.     ALLERGIES:  is allergic to codeine and novocain [procaine].  MEDICATIONS:  Current Outpatient Medications  Medication Sig Dispense Refill   acetaminophen  (TYLENOL ) 500 MG tablet Take 500 mg by mouth every 6 (six) hours as needed for headache.     albuterol  (VENTOLIN  HFA) 108 (90 Base) MCG/ACT inhaler Inhale 2 puffs into the lungs every 6 (six) hours as needed for wheezing or shortness of breath. 54 g 3   amLODipine  (NORVASC ) 5 MG tablet Take 1 tablet (5 mg total) by mouth daily. 90 tablet 3   clopidogrel  (PLAVIX ) 75 MG tablet Take 1 tablet (75 mg total) by mouth daily. 90 tablet 3   cyanocobalamin  (VITAMIN B12) 1000 MCG tablet Take 1 tablet (1,000 mcg total) by mouth daily. 30 tablet 0   cyclobenzaprine (FLEXERIL) 10 MG tablet Take 10 mg by mouth 3 (three) times daily as needed for muscle spasms.     famotidine  (PEPCID ) 20 MG tablet Take by mouth. daily     furosemide  (LASIX ) 40 MG tablet Take 1 tablet (40 mg total) by mouth daily. 90 tablet 3   isosorbide  mononitrate (IMDUR ) 60 MG 24 hr  tablet Take 1 tablet (60 mg total) by mouth 2 (two) times daily. 180 tablet 3   lisinopril  (ZESTRIL ) 10 MG tablet Take 1 tablet (10 mg total) by mouth daily. 90 tablet 3   metoprolol  tartrate (LOPRESSOR ) 25 MG tablet Take 0.5 tablets (12.5 mg total) by mouth 2 (two) times daily. 90 tablet 3   naloxone (NARCAN) nasal spray 4 mg/0.1 mL SMARTSIG:Both Nares     nitroGLYCERIN  (NITROSTAT ) 0.4 MG SL tablet Place 1 tablet (0.4 mg  total) under the tongue every 5 (five) minutes as needed for chest pain (X3 DOSES MAX). 25 tablet 11   oxyCODONE -acetaminophen  (PERCOCET) 7.5-325 MG tablet Take 1-2 tablets by mouth every 4 (four) hours as needed for moderate pain (back pain).     polyethylene glycol (MIRALAX  / GLYCOLAX ) 17 g packet Take 17 g by mouth daily. 14 each 0   ranolazine  (RANEXA ) 500 MG 12 hr tablet Take 1 tablet (500 mg total) by mouth 2 (two) times daily. 180 tablet 3   rosuvastatin  (CRESTOR ) 20 MG tablet Take 1 tablet (20 mg total) by mouth daily. 90 tablet 3   Tiotropium Bromide -Olodaterol (STIOLTO RESPIMAT ) 2.5-2.5 MCG/ACT AERS Inhale 2 puffs into the lungs daily. 4 g 11   VITAMIN D, ERGOCALCIFEROL, PO Take by mouth. weekly     No current facility-administered medications for this visit.    SURGICAL HISTORY:  Past Surgical History:  Procedure Laterality Date   CARDIAC CATHETERIZATION     CARDIAC DEFIBRILLATOR PLACEMENT  03/29/2006   [employer]. remote-yes.    CORONARY ARTERY BYPASS GRAFT     x4   CORONARY STENT INTERVENTION N/A 05/11/2022   Procedure: CORONARY STENT INTERVENTION;  Surgeon: Arnoldo Lapping, MD;  Location[facility] INVASIVE CV LAB;  Service: Cardiovascular;  Laterality: N/A;   CORONARY/GRAFT ACUTE MI REVASCULARIZATION N/A 05/08/2022   Procedure: Coronary/Graft Acute MI Revascularization;  Surgeon: Millicent Ally, MD;  Location: [facility] INVASIVE CV LAB;  Service: Cardiovascular;  Laterality: N/A;   IMPLANTABLE CARDIOVERTER DEFIBRILLATOR GENERATOR CHANGE N/A 03/03/2013   Procedure: IMPLANTABLE CARDIOVERTER DEFIBRILLATOR GENERATOR CHANGE;  Surgeon: Verona Goodwill, MD;  Location: [facility] CATH LAB;  Service: Cardiovascular;  Laterality: N/A;   IR ANGIOGRAM VISCERAL SELECTIVE  05/06/2022   IR AORTAGRAM ABDOMINAL SERIALOGRAM  05/06/2022   IR EMBO ART  VEN HEMORR LYMPH EXTRAV  INC GUIDE ROADMAPPING  05/06/2022   IR US  GUIDE VASC ACCESS RIGHT  05/06/2022   LEFT HEART CATH AND CORONARY ANGIOGRAPHY N/A 05/08/2022    Procedure: LEFT HEART CATH AND CORONARY ANGIOGRAPHY;  Surgeon: Millicent Ally, MD;  Location: [facility] INVASIVE CV LAB;  Service: Cardiovascular;  Laterality: N/A;   LEFT HEART CATH AND CORS/GRAFTS ANGIOGRAPHY N/A 05/11/2022   Procedure: LEFT HEART CATH AND CORS/GRAFTS ANGIOGRAPHY;  Surgeon: Arnoldo Lapping, MD;  Location: [facility] INVASIVE CV LAB;  Service: Cardiovascular;  Laterality: N/A;   stent implant     x1. possibly x2.     REVIEW OF SYSTEMS:  Constitutional: positive for fatigue Eyes: negative Ears, nose, mouth, throat, and face: negative Respiratory: positive for cough and dyspnea on exertion Cardiovascular: negative Gastrointestinal: negative Genitourinary:negative Integument/breast: negative Hematologic/lymphatic: negative Musculoskeletal:negative Neurological: negative Behavioral/Psych: negative Endocrine: negative Allergic/Immunologic: negative   PHYSICAL EXAMINATION: General appearance: alert, cooperative, fatigued, and no distress Head: Normocephalic, without obvious abnormality, atraumatic Neck: no adenopathy, no JVD, supple, symmetrical, trachea midline, and thyroid not enlarged, symmetric, no tenderness/mass/nodules Lymph nodes: Cervical, supraclavicular, and axillary nodes normal. Resp: clear to auscultation bilaterally Back: symmetric, no curvature. ROM normal. No CVA tenderness.  Cardio: regular rate and rhythm, S1, S2 normal, no murmur, click, rub or gallop GI: soft, non-tender; bowel sounds normal; no masses,  no organomegaly Extremities: extremities normal, atraumatic, no cyanosis or edema Neurologic: Alert and oriented X 3, normal strength and tone. Normal symmetric reflexes. Normal coordination and gait  ECOG PERFORMANCE STATUS: 1 - Symptomatic but completely ambulatory  Blood pressure 113/63, pulse 78, temperature (!) 97.3 F (36.3 C), temperature source Temporal, resp. rate 15, height 5\' 3"  (1.6 m), weight 148 lb 1.6 oz (67.2 kg), SpO2 100%.  LABORATORY  DATA: Lab Results  Component Value Date   WBC 4.0 05/06/2023   HGB 13.0 05/06/2023   HCT 38.2 05/06/2023   MCV 94.6 05/06/2023   PLT 187 05/06/2023      Chemistry      Component Value Date/Time   NA 140 05/06/2023 1337   NA 142 06/06/2022 1423   K 4.0 05/06/2023 1337   CL 104 05/06/2023 1337   CO2 29 05/06/2023 1337   BUN 21 05/06/2023 1337   BUN 12 06/06/2022 1423   CREATININE 1.42 (H) 05/06/2023 1337      Component Value Date/Time   CALCIUM  9.8 05/06/2023 1337   ALKPHOS 68 05/06/2023 1337   AST 16 05/06/2023 1337   ALT 13 05/06/2023 1337   BILITOT 0.5 05/06/2023 1337       RADIOGRAPHIC STUDIES: CT Chest W Contrast Result Date: 05/13/2023 CLINICAL DATA:  Staging non-small-cell lung cancer. Shortness of breath since November 2024. * Tracking Code: BO * EXAM: CT CHEST WITH CONTRAST TECHNIQUE: Multidetector CT imaging of the chest was performed during intravenous contrast administration. RADIATION DOSE REDUCTION: This exam was performed according to the departmental dose-optimization program which includes automated exposure control, adjustment of the mA and/or kV according to patient size and/or use of iterative reconstruction technique. CONTRAST:  60mL OMNIPAQUE  IOHEXOL  300 MG/ML  SOLN COMPARISON:  01/03/2023 CT scan FINDINGS: Cardiovascular: Status post median sternotomy. Left upper chest battery pack with leads extending along the right side of the heart. The heart itself is nonenlarged. No pericardial effusion. Coronary artery calcifications are seen. The thoracic aorta has a normal course and caliber with moderate atherosclerotic partially calcified plaque. Mediastinum/Nodes: No enlarged mediastinal, hilar, or axillary lymph nodes. Thyroid gland, trachea, and esophagus demonstrate no significant findings. Lungs/Pleura: Previously there is a nodule in the superior segment right lower lobe with a central solid area and surrounding ground-glass. The area today has a more solid  appearance and less ground-glass with overall solid component today measuring 20 x 13 mm. Previously the solid component measured 12 mm in dimension with the overall nodule of 2.7 x 1.8 cm. No new right-sided lung nodule. There is some dependent right lower lobes areas of scarring and fibrotic change. No right-sided pneumothorax or effusion. There continues to be parenchymal opacity in the superior segment of the left lower lobe with the adjacent pleural thickening and trace loculated fluid. This area previously measured 3.6 x 2.0 cm and today when measured in a similar fashion for continuity measures 3.6 by 1.9 cm. Upper Abdomen: Along the upper abdomen the adrenal glands are preserved. Previous cholecystectomy. Stable small low-attenuation liver lesions. Musculoskeletal: Mild degenerative changes along the spine. IMPRESSION: Slightly changing configuration of the nodule in the superior segment of the right lower lobe. Previously the lesion was a combination of surrounding ground-glass density and central more solid. Today the lesion is more solid diffusely and therefore overall larger. The surrounding ground-glass is improved. Aggressive lesion again is  possible. Stable opacity superior segment left lower lobe with pleural thickening. Aortic Atherosclerosis (ICD10-I70.0). Electronically Signed   By: Adrianna Horde M.D.   On: 05/13/2023 15:54   CUP PACEART INCLINIC DEVICE CHECK Result Date: 05/02/2023 Normal in-clinic ICD check. Thresholds, sensing, and impedance WNL or stable for patient over time. No episodes. Estimated longevity 3.5 years . Pt enrolled in remote follow-up.Wynona Hedger, BSN, RN  CUP PACEART REMOTE DEVICE CHECK Result Date: 04/24/2023 Scheduled remote reviewed. Normal device function.  Next remote 91 days. ML, CVRS   ASSESSMENT AND PLAN: This is a very pleasant 78 years old African-American female with Stage IB (T1b, N0, M0) non-small cell lung cancer, adenocarcinoma presented with left  lower lobe lung nodule diagnosed in September 2022.  She is status post SBRT under the care of Dr. Eloise Hake. Last treatment on 02/15/21.  Unfortunately her last CT scan showed bilateral pulmonary opacities suspicious for low-grade adenocarcinoma but inflammatory process could not be completely ruled out especially with her aspiration issues. Previous CT scan of the chest in October 2023 showed stable disease with stable bilateral pulmonary nodules that did not resolve in the interval but has been stable since the December 06, 2021 scan. A follow-up PET scan showed mildly increased FDG uptake corresponding to the treated lesion within the superior segment of the left lower lobe.  These are secondary to radiation changes.  She also had persistent nonsolid nodule within the posterior right upper lobe with SUV of 3.0 that slightly larger and more hypermetabolic than the previous imaging studies and low-grade malignancy could not be excluded. The patient had repeat CT scan of the chest performed recently.  I personally and independently reviewed the scan images and discussed the results with the patient today. Her scan showed the part solid 2.7 cm posterior right lower lobe pulmonary nodule had increased in size and solid component compared to the previous study.  This nodule is still highly suspicious for primary bronchogenic adenocarcinoma. The patient underwent SBRT to the right lower lobe lung nodule under the care of Dr. Eloise Hake completed in October 2024. She had repeat CT scan of the chest performed recently.  I personally and independently reviewed the scan and discussed the result with the patient today.  Her scan showed more consolidation in the right lower lobe nodule but this could be secondary to her recent radiation.    Non-Small Cell Lung Cancer (NSCLC)   Stage 1B adenocarcinoma diagnosed in September 2022. Underwent SPRT radiation to the left lower lobe nodule in October 2022 and to a new right lower  lobe mass in October 2024. Recent scan shows increased density in the treated area without active growth. Reports intermittent exertional dyspnea and nocturnal dry cough, occasionally productive. No hemoptysis, weight loss, fever, or chills. Weight slightly increased.   - Schedule follow-up scan in three months   - Perform lab tests and scan one week before the next visit   - Consider PET scan if future scans show concerning changes    Dyspnea on Exertion   Exertional dyspnea likely related to lung cancer and treatment history.   - Monitor symptoms and reassess during the next visit    Chronic Cough   Chronic nocturnal cough, mostly dry but occasionally productive. No current cough treatments.   - Monitor symptoms and reassess during the next visit    General Health Maintenance   Primary caregiver is her son, who assists with appointments and daily activities.   - Advise to obtain a caregiver support  letter from her family doctor    Follow-up   - Schedule follow-up visit in three months   - Perform lab tests and scan one week before the next visit.   The patient was advised to call immediately if she has any other concerning symptoms in the interval. The patient voices understanding of current disease status and treatment options and is in agreement with the current care plan.  All questions were answered. The patient knows to call the clinic with any problems, questions or concerns. We can certainly see the patient much sooner if necessary. The total time spent in the appointment was 30 minutes.  Disclaimer: This note was dictated with voice recognition software. Similar sounding words can inadvertently be transcribed and may not be corrected upon review.

## 2023-05-16 ENCOUNTER — Telehealth: Payer: Self-pay

## 2023-05-16 ENCOUNTER — Other Ambulatory Visit: Payer: Self-pay | Admitting: Gastroenterology

## 2023-05-16 ENCOUNTER — Telehealth: Payer: Self-pay | Admitting: *Deleted

## 2023-05-16 ENCOUNTER — Telehealth: Payer: Self-pay | Admitting: Cardiovascular Disease

## 2023-05-16 NOTE — Telephone Encounter (Signed)
Pt has been scheduled tele preop appt 06/16/23. Med rec and consent are done.

## 2023-05-16 NOTE — Telephone Encounter (Signed)
   Name: Karen [name]  DOB: April 08, 1946  MRN: [id number]  Primary Cardiologist: Lance Muss, MD   Preoperative team, please contact this patient and set up a phone call appointment for further preoperative risk assessment. Please obtain consent and complete medication review. Thank you for your help.  I confirm that guidance regarding antiplatelet and oral anticoagulation therapy has been completed and, if necessary, noted below.  Per office protocol, if patient is without any new symptoms or concerns at the time of their virtual visit, she may hold Plavix for 5 days prior to procedure. Please resume Plavix as soon as possible postprocedure, at the discretion of the surgeon.    I also confirmed the patient resides in the state of [place]. As per [facility] Medical Board telemedicine laws, the patient must reside in the state in which the provider is licensed.   Joylene Grapes, NP 05/16/2023, 12:19 PM  [facility]

## 2023-05-16 NOTE — Telephone Encounter (Signed)
   Pre-operative Risk Assessment    Patient Name: Karen [name]  DOB: Oct 22, 1945 MRN: [id number]   Date of last office visit: 05/02/23 Date of next office visit: 06/30/23   Request for Surgical Clearance    Procedure:   Colonoscopy  Date of Surgery:  Clearance 06/30/23                                Surgeon:  Dr. Charlott Rakes Surgeon's Group or Practice Name:  [facility] Phone number:  [phone] Fax number:  [phone]   Type of Clearance Requested:   - Medical  - Pharmacy:  Hold Clopidogrel (Plavix)     Type of Anesthesia:  Not Indicated   Additional requests/questions:    Garrel Ridgel   05/16/2023, 11:46 AM

## 2023-05-16 NOTE — Telephone Encounter (Signed)
Spoke with patient and she stated her son needs letter stating he is her care taker.

## 2023-05-16 NOTE — Telephone Encounter (Signed)
Pt states her son Johnnye Measel needs a letter about his unemployment because he has to take care of the pt due to cardiac condition. Please advise.

## 2023-05-16 NOTE — Telephone Encounter (Signed)
Pt has been scheduled tele preop appt 06/16/23. Med rec and consent are done.      Patient Consent for Virtual Visit        Karen [name] has provided verbal consent on 05/16/2023 for a virtual visit (video or telephone).   CONSENT FOR VIRTUAL VISIT FOR:  Karen [name]  By participating in this virtual visit I agree to the following:  I hereby voluntarily request, consent and authorize [facility] and its employed or contracted physicians, physician assistants, nurse practitioners or other licensed health care professionals (the Practitioner), to provide me with telemedicine health care services (the "Services") as deemed necessary by the treating Practitioner. I acknowledge and consent to receive the Services by the Practitioner via telemedicine. I understand that the telemedicine visit will involve communicating with the Practitioner through live audiovisual communication technology and the disclosure of certain medical information by electronic transmission. I acknowledge that I have been given the opportunity to request an in-person assessment or other available alternative prior to the telemedicine visit and am voluntarily participating in the telemedicine visit.  I understand that I have the right to withhold or withdraw my consent to the use of telemedicine in the course of my care at any time, without affecting my right to future care or treatment, and that the Practitioner or I may terminate the telemedicine visit at any time. I understand that I have the right to inspect all information obtained and/or recorded in the course of the telemedicine visit and may receive copies of available information for a reasonable fee.  I understand that some of the potential risks of receiving the Services via telemedicine include:  Delay or interruption in medical evaluation due to technological equipment failure or disruption; Information transmitted may not be sufficient (e.g. poor  resolution of images) to allow for appropriate medical decision making by the Practitioner; and/or  In rare instances, security protocols could fail, causing a breach of personal health information.  Furthermore, I acknowledge that it is my responsibility to provide information about my medical history, conditions and care that is complete and accurate to the best of my ability. I acknowledge that Practitioner's advice, recommendations, and/or decision may be based on factors not within their control, such as incomplete or inaccurate data provided by me or distortions of diagnostic images or specimens that may result from electronic transmissions. I understand that the practice of medicine is not an exact science and that Practitioner makes no warranties or guarantees regarding treatment outcomes. I acknowledge that a copy of this consent can be made available to me via my patient portal [facility] MyChart), or I can request a printed copy by calling the office of [facility].    I understand that my insurance will be billed for this visit.   I have read or had this consent read to me. I understand the contents of this consent, which adequately explains the benefits and risks of the Services being provided via telemedicine.  I have been provided ample opportunity to ask questions regarding this consent and the Services and have had my questions answered to my satisfaction. I give my informed consent for the services to be provided through the use of telemedicine in my medical care

## 2023-05-20 NOTE — Telephone Encounter (Addendum)
Mailbox full- unable to leave voicemail.

## 2023-05-22 NOTE — Telephone Encounter (Signed)
Attempted to reach patient at both numbers on file, but no answer and no voicemail on either. Will re-attempt later.

## 2023-05-26 NOTE — Telephone Encounter (Signed)
Third and final attempt to reach patient was unsuccessful. No voicemail box has been set up. Will close encounter and address when patient calls back.

## 2023-05-30 NOTE — Progress Notes (Signed)
 Remote ICD transmission.

## 2023-06-16 ENCOUNTER — Ambulatory Visit: Payer: 59 | Attending: Cardiovascular Disease | Admitting: Student

## 2023-06-16 DIAGNOSIS — Z01810 Encounter for preprocedural cardiovascular examination: Secondary | ICD-10-CM | POA: Diagnosis not present

## 2023-06-16 NOTE — Progress Notes (Signed)
Virtual Visit via Telephone Note   Because of Karen [name]'s co-morbid illnesses, she is at least at moderate risk for complications without adequate follow up.  This format is felt to be most appropriate for this patient at this time.  The patient did not have access to video technology/had technical difficulties with video requiring transitioning to audio format only (telephone).  All issues noted in this document were discussed and addressed.  No physical exam could be performed with this format.  Please refer to the patient's chart for her consent to telehealth for Karen [name].  Evaluation Performed:  Preoperative cardiovascular risk assessment _____________   Date:  06/16/2023   Patient ID:  Karen [name], DOB 15-Apr-1946, MRN [id number] Patient Location:  Home Provider location:   Office  Karen [name] Providers Cardiologist:  Lance Muss, MD Electrophysiologist:  Sherryl Manges, MD {  Chief Complaint / Patient Profile   78 y.o. y/o female with a h/o CAD with complicated history including CABG and PCIs (last PCI January 2024), cardiac arrest s/p ICD implantation, hypertension, hyperlipidemia, GERD, COPD, tobacco abuse, lung cancer who is pending colonoscopy by Dr. Bosie Clos on 06/30/2023 and presents today for telephonic preoperative cardiovascular risk assessment.  History of Present Illness    Karen [name] is a 78 y.o. female who presents via audio/video conferencing for a telehealth visit today.  Pt was last seen in cardiology clinic on 01/13/2023 by Dr. Eldridge Dace and 05/02/2023 by Dr. Graciela Husbands.  At that time Karen [name] was stable from a cardiac standpoint.  The patient is now pending procedure as outlined above. Since her last visit, she is doing well. Patient denies lower extremity edema, orthopnea or PND. She has chronic, stable dyspnea related to lung cancer. No chest pain, pressure, or tightness. She reports occasional palpitations that are not  particularly bothersome to her. She ambulates with a cane for stability. She is able to walk short distances. She does some light to moderate household activities including washing dishes, dusting, grocery shopping with assistance and carrying in groceries.     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 Antony Blackbird   HTN (hypertension)    Other and unspecified hyperlipidemia    Other diseases of lung, not elsewhere classified    Postsurgical aortocoronary bypass status    hx of it.    Past Surgical History:  Procedure Laterality Date   CARDIAC CATHETERIZATION     CARDIAC DEFIBRILLATOR PLACEMENT  03/29/2006   [employer]. remote-yes.    CORONARY ARTERY BYPASS GRAFT     x4   CORONARY STENT INTERVENTION N/A 05/11/2022   Procedure: CORONARY STENT INTERVENTION;  Surgeon: Tonny Bollman, MD;  Location: [facility] INVASIVE CV LAB;  Service: Cardiovascular;  Laterality: N/A;   CORONARY/GRAFT ACUTE MI REVASCULARIZATION N/A 05/08/2022   Procedure: Coronary/Graft Acute MI Revascularization;  Surgeon: Lennette Bihari, MD;  Location: [facility] INVASIVE CV LAB;  Service: Cardiovascular;  Laterality: N/A;   IMPLANTABLE CARDIOVERTER DEFIBRILLATOR GENERATOR CHANGE N/A 03/03/2013   Procedure: IMPLANTABLE CARDIOVERTER DEFIBRILLATOR GENERATOR CHANGE;  Surgeon: [name] Salvia, MD;  Location: [name] CATH LAB;  Service: Cardiovascular;  Laterality: N/A;   IR ANGIOGRAM VISCERAL SELECTIVE  05/06/2022   IR AORTAGRAM ABDOMINAL SERIALOGRAM  05/06/2022   IR EMBO ART  VEN HEMORR LYMPH EXTRAV  INC  GUIDE ROADMAPPING  05/06/2022   IR US GUIDE VASC ACCESS RIGHT  05/06/2022   LEFT HEART CATH AND CORONARY ANGIOGRAPHY N/A 05/08/2022   Procedure: LEFT HEART CATH AND CORONARY ANGIOGRAPHY;  Surgeon: Lennette Bihari, MD;  Location: [facility] INVASIVE CV LAB;  Service: Cardiovascular;  Laterality: N/A;   LEFT HEART CATH AND CORS/GRAFTS ANGIOGRAPHY N/A 05/11/2022   Procedure: LEFT HEART CATH AND CORS/GRAFTS ANGIOGRAPHY;  Surgeon: Tonny Bollman, MD;  Location: [facility] INVASIVE CV LAB;  Service: Cardiovascular;  Laterality: N/A;   stent implant     x1. possibly x2.     Allergies  Allergies  Allergen Reactions   Codeine Itching and Swelling   Novocain [Procaine] Hives and Swelling    Eyes swelling    Home Medications    Prior to Admission medications   Medication Sig Start Date End Date Taking? Authorizing Provider  acetaminophen (TYLENOL) 500 MG tablet Take 500 mg by mouth every 6 (six) hours as needed for headache.    [provider]  albuterol (VENTOLIN HFA) 108 (90 Base) MCG/ACT inhaler Inhale 2 puffs into the lungs every 6 (six) hours as needed for wheezing or shortness of breath. 02/12/23   Leslye Peer, MD  amLODipine (NORVASC) 5 MG tablet Take 1 tablet (5 mg total) by mouth daily. 07/12/22 07/12/23  Swinyer, Zachary George, NP  clopidogrel (PLAVIX) 75 MG tablet Take 1 tablet (75 mg total) by mouth daily. 07/12/22   Swinyer, Zachary George, NP  cyanocobalamin (VITAMIN B12) 1000 MCG tablet Take 1 tablet (1,000 mcg total) by mouth daily. 05/08/22   Tyrone Nine, MD  cyclobenzaprine (FLEXERIL) 10 MG tablet Take 10 mg by mouth 3 (three) times daily as needed for muscle spasms. 01/16/21   [provider]  famotidine (PEPCID) 20 MG tablet Take by mouth. daily    [provider]  furosemide (LASIX) 40 MG tablet Take 1 tablet (40 mg total) by mouth daily. 07/12/22   Swinyer, Zachary George, NP  isosorbide mononitrate (IMDUR) 60 MG 24 hr tablet Take 1 tablet (60 mg total) by mouth 2 (two)  times daily. 07/12/22 07/13/23  Swinyer, Zachary George, NP  lisinopril (ZESTRIL) 10 MG tablet Take 1 tablet (10 mg total) by mouth daily. 07/12/22 07/12/23  Swinyer, Zachary George, NP  metoprolol tartrate (LOPRESSOR) 25 MG tablet Take 0.5 tablets (12.5 mg total) by mouth 2 (two) times daily. 07/16/22 07/16/23  Swinyer, Zachary George, NP  naloxone [name]) nasal spray 4 mg/0.1 mL SMARTSIG:Both Nares 02/26/22   [provider]  nitroGLYCERIN (NITROSTAT) 0.4 MG SL tablet Place 1 tablet (0.4 mg total) under the tongue every 5 (five) minutes as needed for chest pain (X3 DOSES MAX). 02/16/21   Bhagat, Sharrell Ku, PA  oxyCODONE-acetaminophen (PERCOCET) 7.5-325 MG tablet Take 1-2 tablets by mouth every 4 (four) hours as needed for moderate pain (back pain).    [provider]  polyethylene glycol (MIRALAX / GLYCOLAX) 17 g packet Take 17 g by mouth daily. 05/16/22   Dorcas Carrow, MD  ranolazine (RANEXA) 500 MG 12 hr tablet Take 1 tablet (500 mg total) by mouth 2 (two) times daily. 07/12/22 07/12/23  Swinyer, Zachary George, NP  rosuvastatin (CRESTOR) 20 MG tablet Take 1 tablet (20 mg total) by  mouth daily. 07/12/22 07/12/23  Swinyer, Zachary George, NP  Tiotropium Bromide-Olodaterol (STIOLTO RESPIMAT) 2.5-2.5 MCG/ACT AERS Inhale 2 puffs into the lungs daily. 02/12/23   Leslye Peer, MD  VITAMIN D, ERGOCALCIFEROL, PO Take by mouth. weekly    [provider]    Physical Exam    Vital Signs:  Karen [name] does not have vital signs available for review today.  Given telephonic nature of communication, physical exam is limited. AAOx3. NAD. Normal affect.  Speech and respirations are unlabored.  Assessment & Plan    Primary Cardiologist: Lance Muss, MD  Preoperative cardiovascular risk assessment. Colonoscopy by Dr. Bosie Clos on 06/30/2023.  Chart reviewed as part of pre-operative protocol coverage. According to the RCRI, patient has a 0.9% risk of MACE. Patient reports activity equivalent to  4.0 METS (short walks throughout the day, light to moderate household activities).   Given past medical history and time since last visit, based on ACC/AHA guidelines, Karen [name] would be at acceptable risk for the planned procedure without further cardiovascular testing.   Patient was advised that if she develops new symptoms prior to surgery to contact our office to arrange a follow-up appointment.  she verbalized understanding.  Per office protocol, he may hold Plavix for 5 days prior to procedure and should resume as soon as hemodynamically stable postoperatively.  Patient will take aspirin 81 mg daily throughout the perioperative period. She will stop aspirin when she resumes Plavix.   I will route this recommendation to the requesting party via Epic fax function.  Please call with questions.  Time:   Today, I have spent 5 minutes with the patient with telehealth technology discussing medical history, symptoms, and management plan.     Carlos Levering, NP  06/16/2023, 7:26 AM

## 2023-06-16 NOTE — Addendum Note (Signed)
Addended by: Carlos Levering on: 06/16/2023 09:50 AM   Modules accepted: Level of Service

## 2023-06-23 ENCOUNTER — Encounter (HOSPITAL_COMMUNITY): Payer: Self-pay | Admitting: Gastroenterology

## 2023-06-27 ENCOUNTER — Telehealth: Payer: Self-pay | Admitting: Student

## 2023-06-27 MED ORDER — NITROGLYCERIN 0.4 MG SL SUBL: 0.4000 mg | SUBLINGUAL_TABLET | SUBLINGUAL | 2 refills | Status: AC | PRN

## 2023-06-27 NOTE — Telephone Encounter (Signed)
*  STAT* If patient is at the pharmacy, call can be transferred to refill team.   1. Which medications need to be refilled? (please list name of each medication and dose if known) nitroGLYCERIN (NITROSTAT) 0.4 MG SL tablet    2. Would you like to learn more about the convenience, safety, & potential cost savings by using the [facility]?     3. Are you open to using the [facility] (Type [facility]. ).   4. Which pharmacy/location (including street and city if local pharmacy) is medication to be sent to?nitroGLYCERIN (NITROSTAT) 0.4 MG SL tablet    5. Do they need a 30 day or 90 day supply? 25

## 2023-06-27 NOTE — Anesthesia Preprocedure Evaluation (Addendum)
 Anesthesia Evaluation  Patient identified by MRN, date of birth, ID band Patient awake    Reviewed: Allergy & Precautions, NPO status , Patient's Chart, lab work & pertinent test results, reviewed documented beta blocker date and time   History of Anesthesia Complications Negative for: history of anesthetic complications  Airway Mallampati: II  TM Distance: >3 FB Neck ROM: Full    Dental  (+) Edentulous Upper, Edentulous Lower   Pulmonary COPD,  COPD inhaler, Patient abstained from smoking., former smoker  Lung cancer    Pulmonary exam normal        Cardiovascular hypertension, Pt. on medications and Pt. on home beta blockers + CAD, + Past MI, + Cardiac Stents and + CABG  Normal cardiovascular exam+ Cardiac Defibrillator    04/2022 Cath - 1.  Total occlusion of the native RCA 2.  Patent left main and LAD with mild diffuse nonobstructive plaquing, no significant changes from recent cardiac catheterization study 3.  Patent left circumflex and intermediate branch with diffuse plaquing and distal vessel disease but no significant proximal stenosis 4.  Known occlusion of diagonal and obtuse marginal saphenous vein grafts 5.  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  Severe saphenous vein graft to RCA stenoses, treated with drug-eluting stent implantation (3.0 x 24 mm Synergy DES in distal body of graft, overlapping 3.0 x 32 mm and 3.0 x 16 mm Synergy DES in proximal body of graft within the previously implanted stent)  '23 TTE - EF 50 to 55%. The left ventricle demonstrates regional wall motion abnormalities (see scoring diagram/findings for description). Mild mitral valve  regurgitation. No evidence of mitral stenosis.     Neuro/Psych  Headaches  negative psych ROS   GI/Hepatic Neg liver ROS,GERD  Medicated and Controlled,,  Endo/Other  negative endocrine ROS    Renal/GU negative Renal ROS     Musculoskeletal negative musculoskeletal  ROS (+)    Abdominal   Peds  Hematology  On plavix    Anesthesia Other Findings   Reproductive/Obstetrics                             Anesthesia Physical Anesthesia Plan  ASA: 3  Anesthesia Plan: MAC   Post-op Pain Management: Minimal or no pain anticipated   Induction:   PONV Risk Score and Plan: 2 and Propofol infusion and Treatment may vary due to age or medical condition  Airway Management Planned: Natural Airway and Simple Face Mask  Additional Equipment: None  Intra-op Plan:   Post-operative Plan:   Informed Consent: I have reviewed the patients History and Physical, chart, labs and discussed the procedure including the risks, benefits and alternatives for the proposed anesthesia with the patient or authorized representative who has indicated his/her understanding and acceptance.       Plan Discussed with: CRNA and Anesthesiologist  Anesthesia Plan Comments:         Anesthesia Quick Evaluation

## 2023-06-30 ENCOUNTER — Ambulatory Visit (HOSPITAL_COMMUNITY): Payer: Self-pay | Admitting: Anesthesiology

## 2023-06-30 ENCOUNTER — Encounter (HOSPITAL_COMMUNITY)
Admission: RE | Disposition: A | Discharge status: Home / Self Care | Payer: Self-pay | Source: Home / Self Care | Attending: Gastroenterology

## 2023-06-30 ENCOUNTER — Other Ambulatory Visit: Payer: Self-pay

## 2023-06-30 ENCOUNTER — Ambulatory Visit (HOSPITAL_BASED_OUTPATIENT_CLINIC_OR_DEPARTMENT_OTHER): Payer: Self-pay | Admitting: Anesthesiology

## 2023-06-30 ENCOUNTER — Ambulatory Visit (HOSPITAL_COMMUNITY)
Admission: RE | Admit: 2023-06-30 | Discharge: 2023-06-30 | Disposition: A | Discharge status: Home / Self Care | Payer: 59 | Attending: Gastroenterology | Admitting: Gastroenterology

## 2023-06-30 ENCOUNTER — Encounter (HOSPITAL_COMMUNITY): Payer: Self-pay | Admitting: Gastroenterology

## 2023-06-30 DIAGNOSIS — I10 Essential (primary) hypertension: Secondary | ICD-10-CM | POA: Diagnosis not present

## 2023-06-30 DIAGNOSIS — D124 Benign neoplasm of descending colon: Secondary | ICD-10-CM | POA: Diagnosis not present

## 2023-06-30 DIAGNOSIS — K5730 Diverticulosis of large intestine without perforation or abscess without bleeding: Secondary | ICD-10-CM | POA: Insufficient documentation

## 2023-06-30 DIAGNOSIS — K920 Hematemesis: Secondary | ICD-10-CM

## 2023-06-30 DIAGNOSIS — Z951 Presence of aortocoronary bypass graft: Secondary | ICD-10-CM | POA: Insufficient documentation

## 2023-06-30 DIAGNOSIS — I252 Old myocardial infarction: Secondary | ICD-10-CM | POA: Insufficient documentation

## 2023-06-30 DIAGNOSIS — K921 Melena: Secondary | ICD-10-CM | POA: Insufficient documentation

## 2023-06-30 DIAGNOSIS — D122 Benign neoplasm of ascending colon: Secondary | ICD-10-CM | POA: Diagnosis not present

## 2023-06-30 DIAGNOSIS — Z955 Presence of coronary angioplasty implant and graft: Secondary | ICD-10-CM | POA: Diagnosis not present

## 2023-06-30 DIAGNOSIS — J449 Chronic obstructive pulmonary disease, unspecified: Secondary | ICD-10-CM | POA: Diagnosis not present

## 2023-06-30 DIAGNOSIS — K640 First degree hemorrhoids: Secondary | ICD-10-CM | POA: Insufficient documentation

## 2023-06-30 DIAGNOSIS — Z79899 Other long term (current) drug therapy: Secondary | ICD-10-CM | POA: Diagnosis not present

## 2023-06-30 DIAGNOSIS — I2510 Atherosclerotic heart disease of native coronary artery without angina pectoris: Secondary | ICD-10-CM | POA: Diagnosis not present

## 2023-06-30 DIAGNOSIS — K219 Gastro-esophageal reflux disease without esophagitis: Secondary | ICD-10-CM | POA: Diagnosis not present

## 2023-06-30 HISTORY — PX: POLYPECTOMY: SHX5525

## 2023-06-30 HISTORY — PX: COLONOSCOPY WITH PROPOFOL: SHX5780

## 2023-06-30 SURGERY — COLONOSCOPY WITH PROPOFOL
Anesthesia: Monitor Anesthesia Care

## 2023-06-30 MED ORDER — PROPOFOL 500 MG/50ML IV EMUL
INTRAVENOUS | Status: DC | PRN
  Administered 2023-06-30: 50 ug/kg/min via INTRAVENOUS

## 2023-06-30 MED ORDER — ONDANSETRON HCL 4 MG/2ML IJ SOLN
INTRAMUSCULAR | Status: DC | PRN
  Administered 2023-06-30: 4 mg via INTRAVENOUS

## 2023-06-30 MED ORDER — LACTATED RINGERS IV SOLN: INTRAVENOUS | Status: DC | PRN

## 2023-06-30 MED ORDER — PHENYLEPHRINE 80 MCG/ML (10ML) SYRINGE FOR IV PUSH (FOR BLOOD PRESSURE SUPPORT)
PREFILLED_SYRINGE | INTRAVENOUS | Status: DC | PRN
  Administered 2023-06-30: 80 ug via INTRAVENOUS

## 2023-06-30 MED ORDER — PROPOFOL 10 MG/ML IV BOLUS
INTRAVENOUS | Status: DC | PRN
  Administered 2023-06-30: 20 mg via INTRAVENOUS
  Administered 2023-06-30: 30 mg via INTRAVENOUS
  Administered 2023-06-30: 20 mg via INTRAVENOUS
  Administered 2023-06-30: 80 mg via INTRAVENOUS
  Administered 2023-06-30: 20 mg via INTRAVENOUS

## 2023-06-30 MED ORDER — SUCCINYLCHOLINE CHLORIDE 200 MG/10ML IV SOSY
PREFILLED_SYRINGE | INTRAVENOUS | Status: DC | PRN
  Administered 2023-06-30: 60 mg via INTRAVENOUS

## 2023-06-30 MED ORDER — PROPOFOL 1000 MG/100ML IV EMUL
INTRAVENOUS | Status: AC
  Filled 2023-06-30: qty 100

## 2023-06-30 SURGICAL SUPPLY — 20 items

## 2023-06-30 NOTE — H&P (Signed)
 Date of Initial H&P: 06/24/23  History reviewed, patient examined, no change in status, stable for surgery.

## 2023-06-30 NOTE — Op Note (Signed)
 [facility] Patient Name: Karen [name] Procedure Date: 06/30/2023 MRN: [id number] Attending MD: Shirley Friar , MD, [id number] Date of Birth: 1945-08-11 CSN: [id number] Age: 79 Admit Type: Outpatient Procedure:                Colonoscopy Indications:              Last colonoscopy: date unknown (unable to locate                            last colonoscopy report), Hematochezia Providers:                Shirley Friar, MD, Lorenza Evangelist, RN, Alan Ripper, Technician Referring MD:             Knox Royalty Medicines:                Propofol per Anesthesia, Monitored Anesthesia Care                            (intubated during procedure due to retching and                            vomiting) Complications:            No immediate complications. Estimated Blood Loss:     Estimated blood loss: none. Procedure:                Pre-Anesthesia Assessment:                           - Prior to the procedure, a History and Physical                            was performed, and patient medications and                            allergies were reviewed. The patient's tolerance of                            previous anesthesia was also reviewed. The risks                            and benefits of the procedure and the sedation                            options and risks were discussed with the patient.                            All questions were answered, and informed consent                            was obtained. Prior Anticoagulants: The patient has                            taken  Plavix (clopidogrel), last dose was 5 days                            prior to procedure. ASA Grade Assessment: III - A                            patient with severe systemic disease. After                            reviewing the risks and benefits, the patient was                            deemed in satisfactory condition to undergo the                             procedure.                           After obtaining informed consent, the colonoscope                            was passed under direct vision. Throughout the                            procedure, the patient's blood pressure, pulse, and                            oxygen saturations were monitored continuously. The                            [id number]-HQ190L ([id number]) Olympus colonoscope was                            introduced through the anus and advanced to the the                            cecum, identified by appendiceal orifice and                            ileocecal valve. The colonoscopy was performed with                            difficulty due to inadequate bowel prep,                            significant looping and a tortuous colon.                            Successful completion of the procedure was aided by                            applying abdominal pressure and lavage. The patient  tolerated the procedure. The quality of the bowel                            preparation was inadequate. The ileocecal valve,                            appendiceal orifice, and rectum were photographed. Scope In: 8:22:37 AM Scope Out: 9:03:37 AM Scope Withdrawal Time: 0 hours 33 minutes 57 seconds  Total Procedure Duration: 0 hours 41 minutes 0 seconds  Findings:      The perianal and digital rectal examinations were normal.      Multiple large-mouthed, medium-mouthed and small-mouthed diverticula       were found in the entire colon.      A 12 mm polyp was found in the ascending colon. The polyp was       semi-sessile. The polyp was removed with a hot snare. Resection and       retrieval were complete. Estimated blood loss: none.      An 8 mm polyp was found in the descending colon. The polyp was sessile.       The polyp was removed with a hot snare. Resection and retrieval were       complete. Estimated blood loss: none.      Internal hemorrhoids were  found during retroflexion. The hemorrhoids       were medium-sized and Grade I (internal hemorrhoids that do not       prolapse).      Extensive amounts of semi-liquid semi-solid solid stool was found in the       entire colon, interfering with visualization. Lavage of the area was       performed, resulting in clearance with fair visualization. Impression:               - Preparation of the colon was inadequate.                           - Diverticulosis in the entire examined colon.                           - One 12 mm polyp in the ascending colon, removed                            with a hot snare. Resected and retrieved.                           - One 8 mm polyp in the descending colon, removed                            with a hot snare. Resected and retrieved.                           - Internal hemorrhoids.                           - Stool in the entire examined colon. Moderate Sedation:      N/A - MAC procedure Recommendation:           - Patient has a  contact number available for                            emergencies. The signs and symptoms of potential                            delayed complications were discussed with the                            patient. Return to normal activities tomorrow.                            Written discharge instructions were provided to the                            patient.                           - High fiber diet.                           - Await pathology results.                           - Resume Plavix (clopidogrel) at prior dose in 3                            days. Procedure Code(s):        --- Professional ---                           [phone], Colonoscopy, flexible; with removal of                            tumor(s), polyp(s), or other lesion(s) by snare                            technique Diagnosis Code(s):        --- Professional ---                           K92.1, Melena (includes Hematochezia)                            D12.2, Benign neoplasm of ascending colon                           D12.4, Benign neoplasm of descending colon                           K64.0, First degree hemorrhoids                           K57.30, Diverticulosis of large intestine without                            perforation or abscess without bleeding CPT copyright 2022 American  Medical Association. All rights reserved. The codes documented in this report are preliminary and upon coder review may  be revised to meet current compliance requirements. Shirley Friar, MD 06/30/2023 9:15:41 AM This report has been signed electronically. Number of Addenda: 0

## 2023-06-30 NOTE — Discharge Instructions (Signed)
 Hold Plavix until Thursday March 6 and then resume.

## 2023-06-30 NOTE — Transfer of Care (Signed)
 Immediate Anesthesia Transfer of Care Note  Patient: Karen [name]  Procedure(s) Performed: Procedure(s): COLONOSCOPY WITH PROPOFOL (N/A) POLYPECTOMY  Patient Location: PACU  Anesthesia Type:General  Level of Consciousness: Patient easily awoken, comfortable, cooperative, following commands, responds to stimulation.   Airway & Oxygen Therapy: Patient spontaneously breathing, ventilating well, oxygen via simple oxygen mask.  Post-op Assessment: Report given to PACU RN, vital signs reviewed and stable, moving all extremities.   Post vital signs: Reviewed and stable.  Complications: No apparent anesthesia complications  Last Vitals:  Vitals Value Taken Time  BP 115/82 06/30/23 0925  Temp 36.4 C 06/30/23 0925  Pulse 99 06/30/23 0925  Resp 15 06/30/23 0925  SpO2 98 % 06/30/23 0925  Vitals shown include unfiled device data.  Last Pain:  Vitals:   06/30/23 0925  TempSrc: Axillary  PainSc:          Complications: No notable events documented.

## 2023-06-30 NOTE — Anesthesia Procedure Notes (Signed)
 Procedure Name: Intubation Date/Time: 06/30/2023 8:43 AM  Performed by: Ludwig Lean, CRNAPre-anesthesia Checklist: Patient identified, Emergency Drugs available, Suction available and Patient being monitored Patient Re-evaluated:Patient Re-evaluated prior to induction Oxygen Delivery Method: Circle system utilized Preoxygenation: Pre-oxygenation with 100% oxygen Induction Type: IV induction and Rapid sequence Laryngoscope Size: Mac and 3 Grade View: Grade I Tube type: Oral Tube size: 7.0 mm Number of attempts: 1 Airway Equipment and Method: Stylet Placement Confirmation: ETT inserted through vocal cords under direct vision, positive ETCO2 and breath sounds checked- equal and bilateral Secured at: 21 cm Tube secured with: Tape Dental Injury: Teeth and Oropharynx as per pre-operative assessment  Comments: No gastric secretions seen on cords

## 2023-06-30 NOTE — Interval H&P Note (Signed)
 History and Physical Interval Note:  06/30/2023 8:13 AM  Karen [name]  has presented today for surgery, with the diagnosis of Gastrointestinal hemorrhage.  The various methods of treatment have been discussed with the patient and family. After consideration of risks, benefits and other options for treatment, the patient has consented to  Procedure(s): COLONOSCOPY WITH PROPOFOL (N/A) as a surgical intervention.  The patient's history has been reviewed, patient examined, no change in status, stable for surgery.  I have reviewed the patient's chart and labs.  Questions were answered to the patient's satisfaction.     Shirley Friar

## 2023-06-30 NOTE — Anesthesia Procedure Notes (Signed)
 Procedure Name: MAC Date/Time: 06/30/2023 8:19 AM  Performed by: Ludwig Lean, CRNAPre-anesthesia Checklist: Patient identified, Emergency Drugs available, Suction available and Patient being monitored Patient Re-evaluated:Patient Re-evaluated prior to induction Oxygen Delivery Method: Simple face mask Preoxygenation: Pre-oxygenation with 100% oxygen Placement Confirmation: positive ETCO2 and breath sounds checked- equal and bilateral

## 2023-06-30 NOTE — Anesthesia Postprocedure Evaluation (Signed)
 Anesthesia Post Note  Patient: Karen [name]  Procedure(s) Performed: COLONOSCOPY WITH PROPOFOL POLYPECTOMY     Patient location during evaluation: PACU Anesthesia Type: General Level of consciousness: awake and alert Pain management: pain level controlled Vital Signs Assessment: post-procedure vital signs reviewed and stable Respiratory status: spontaneous breathing, nonlabored ventilation and respiratory function stable Cardiovascular status: stable and blood pressure returned to baseline Anesthetic complications: no Comments: Vomited near beginning of procedure under MAC. No signs of aspiration. Intubated for airway protection for rest of the procedure, stomach and ETT suctioned prior to extubation.   No notable events documented.  Last Vitals:  Vitals:   06/30/23 0947 06/30/23 0948  BP: (!) 124/58   Pulse:  89  Resp: (!) 23 14  Temp:    SpO2:  100%    Last Pain:  Vitals:   06/30/23 0947  TempSrc:   PainSc: 0-No pain                 Beryle Lathe

## 2023-07-01 LAB — SURGICAL PATHOLOGY

## 2023-07-03 ENCOUNTER — Encounter (HOSPITAL_COMMUNITY): Payer: Self-pay | Admitting: Gastroenterology

## 2023-07-11 ENCOUNTER — Other Ambulatory Visit: Payer: Self-pay | Admitting: Nurse Practitioner

## 2023-07-15 ENCOUNTER — Ambulatory Visit: Payer: 59 | Attending: Cardiovascular Disease | Admitting: Cardiovascular Disease

## 2023-07-15 ENCOUNTER — Encounter: Payer: Self-pay | Admitting: Cardiovascular Disease

## 2023-07-15 VITALS — BP 110/82 | HR 77 | Ht 63.0 in | Wt 147.0 lb

## 2023-07-15 DIAGNOSIS — I209 Angina pectoris, unspecified: Secondary | ICD-10-CM

## 2023-07-15 DIAGNOSIS — I10 Essential (primary) hypertension: Secondary | ICD-10-CM | POA: Diagnosis not present

## 2023-07-15 DIAGNOSIS — I4729 Other ventricular tachycardia: Secondary | ICD-10-CM

## 2023-07-15 DIAGNOSIS — E785 Hyperlipidemia, unspecified: Secondary | ICD-10-CM | POA: Diagnosis not present

## 2023-07-15 MED ORDER — METOPROLOL TARTRATE 25 MG PO TABS: 12.5000 mg | ORAL_TABLET | Freq: Two times a day (BID) | ORAL | 3 refills | Status: AC

## 2023-07-15 MED ORDER — FUROSEMIDE 40 MG PO TABS: 40.0000 mg | ORAL_TABLET | Freq: Every day | ORAL | 3 refills | Status: AC

## 2023-07-15 MED ORDER — RANOLAZINE ER 500 MG PO TB12: 500.0000 mg | ORAL_TABLET | Freq: Two times a day (BID) | ORAL | 3 refills | Status: AC

## 2023-07-15 MED ORDER — AMLODIPINE BESYLATE 5 MG PO TABS
5.0000 mg | ORAL_TABLET | Freq: Every day | ORAL | 3 refills | Status: AC
Start: 2023-07-15 — End: ?

## 2023-07-15 MED ORDER — ISOSORBIDE MONONITRATE ER 60 MG PO TB24: 60.0000 mg | ORAL_TABLET | Freq: Two times a day (BID) | ORAL | 3 refills | Status: AC

## 2023-07-15 NOTE — Progress Notes (Unsigned)
 Cardiology Office Note:    Date:  07/17/2023   ID:  Karen [name], Karen [name] 1946/04/13, MRN [id number]  PCP:  Center, [facility]   [facility] Providers Cardiologist:  Tonny Bollman, MD Electrophysiologist:  Sherryl Manges, MD     Referring MD: Center, [facility]   Chief Complaint  Patient presents with   Chest Pain    History of Present Illness:    Karen [name] is a 78 y.o. female presenting for cardiology follow-up evaluation.  She is a former patient of Dr. Eldridge Dace and I will be assuming her cardiac care moving forward.  The patient has a history of ischemic heart disease status postcardiac arrest and ICD implantation.  She underwent emergency left main angioplasty followed by emergent CABG.  She was diagnosed with lung cancer in 2022 and has been treated with radiation therapy.  She had a lower GI bleed in 2024 with transient ST elevation.  She underwent saphenous vein graft PCI at that time when she was found to have severe stenosis in the saphenous vein graft RCA and she was treated with multiple drug-eluting stents.  She has been maintained on clopidogrel for antiplatelet therapy.  The patient is here alone today.  She has been having some episodes of chest discomfort, at times with physical activity.  She describes a central and left-sided chest pain that resolves spontaneously.  She is not requiring sublingual nitroglycerin.  She states that some days she can exert herself without problems and other days she experiences chest discomfort.  She also complains of shortness of breath with activity.  She had a CT scan of the chest for surveillance through the cancer center that showed some increased consolidation and she is scheduled to have another CT scan early next month.  No other complaints raised today.     Current Medications: Current Meds  Medication Sig   acetaminophen (TYLENOL) 500 MG tablet Take 500 mg by mouth every 6 (six) hours as needed for  headache.   albuterol (VENTOLIN HFA) 108 (90 Base) MCG/ACT inhaler Inhale 2 puffs into the lungs every 6 (six) hours as needed for wheezing or shortness of breath.   clopidogrel (PLAVIX) 75 MG tablet Take 1 tablet (75 mg total) by mouth daily.   cyanocobalamin (VITAMIN B12) 1000 MCG tablet Take 1 tablet (1,000 mcg total) by mouth daily.   cyclobenzaprine (FLEXERIL) 10 MG tablet Take 10 mg by mouth 3 (three) times daily as needed for muscle spasms.   famotidine (PEPCID) 20 MG tablet Take by mouth. daily   lisinopril (ZESTRIL) 10 MG tablet Take 1 tablet (10 mg total) by mouth daily.   naloxone (NARCAN) nasal spray 4 mg/0.1 mL SMARTSIG:Both Nares   nitroGLYCERIN (NITROSTAT) 0.4 MG SL tablet Place 1 tablet (0.4 mg total) under the tongue every 5 (five) minutes as needed for chest pain (X3 DOSES MAX).   oxyCODONE-acetaminophen (PERCOCET) 7.5-325 MG tablet Take 1-2 tablets by mouth every 4 (four) hours as needed for moderate pain (back pain).   polyethylene glycol (MIRALAX / GLYCOLAX) 17 g packet Take 17 g by mouth daily.   Tiotropium Bromide-Olodaterol (STIOLTO RESPIMAT) 2.5-2.5 MCG/ACT AERS Inhale 2 puffs into the lungs daily.   VITAMIN D, ERGOCALCIFEROL, PO Take by mouth. weekly   [DISCONTINUED] amLODipine (NORVASC) 5 MG tablet Take 1 tablet (5 mg total) by mouth daily.   [DISCONTINUED] furosemide (LASIX) 40 MG tablet Take 1 tablet (40 mg total) by mouth daily.   [DISCONTINUED] metoprolol tartrate (LOPRESSOR) 25 MG tablet  Take 0.5 tablets (12.5 mg total) by mouth 2 (two) times daily.   [DISCONTINUED] ranolazine (RANEXA) 500 MG 12 hr tablet Take 1 tablet (500 mg total) by mouth 2 (two) times daily.     Allergies:   Codeine and Novocain [procaine]   ROS:   Please see the history of present illness.    Positive for leg burning and pain/neuropathy.  All other systems reviewed and are negative.  EKGs/Labs/Other Studies Reviewed:    The following studies were reviewed today: Cardiac Studies &  Procedures   ______________________________________________________________________________________________ CARDIAC CATHETERIZATION  CARDIAC CATHETERIZATION 05/11/2022  Narrative 1.  Total occlusion of the native RCA 2.  Patent left main and LAD with mild diffuse nonobstructive plaquing, no significant changes from recent cardiac catheterization study 3.  Patent left circumflex and intermediate branch with diffuse plaquing and distal vessel disease but no significant proximal stenosis 4.  Known occlusion of diagonal and obtuse marginal saphenous vein grafts 5.  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  Severe saphenous vein graft to RCA stenoses, treated with drug-eluting stent implantation (3.0 x 24 mm Synergy DES in distal body of graft, overlapping 3.0 x 32 mm and 3.0 x 16 mm Synergy DES in proximal body of graft within the previously implanted stent)  Recommendations: Long-term DAPT with aspirin and clopidogrel as tolerated.  Findings Coronary Findings Diagnostic  Dominance: Right  Left Anterior Descending Ost LAD to Prox LAD lesion is 30% stenosed. Dist LAD lesion is 30% stenosed.  Ramus Intermedius Ramus lesion is 40% stenosed.  Left Circumflex Prox Cx to Mid Cx lesion is 75% stenosed.  Right Coronary Artery Mid RCA lesion is 70% stenosed. The lesion was previously treated .  Graft To Dist RCA Origin lesion is 25% stenosed. The lesion was previously treated . Prox Graft lesion is 75% stenosed. Prox Graft to Mid Graft lesion is 40% stenosed. Mid Graft lesion is 75% stenosed. Dist Graft lesion is 75% stenosed.  Intervention  Prox Graft lesion (Graft To Dist RCA) Stent A drug-eluting stent was successfully placed using a STENT SYNERGY XD 3.0X32. There is significant pressure dampening at the ostium of the saphenous vein graft even without obvious obstruction.  Because of this and its proximity to the area being treated, I elected to cover it with a longer stent.  Following stent  deployment, there is contrast staining at the distal edge of the stent.  Vein graft dissection is suspected.  An overlapping 3.0 x 16 mm Synergy stent is deployed. Post-Intervention Lesion Assessment The intervention was successful. Pre-interventional TIMI flow is 3. Post-intervention TIMI flow is 3. No complications occurred at this lesion. There is a 0% residual stenosis post intervention.  Mid Graft lesion (Graft To Dist RCA) Stent (Also treats lesions: Dist Graft) A drug-eluting stent was successfully placed using a SYNERGY XD 3.0X24. Post-Intervention Lesion Assessment The intervention was successful. Pre-interventional TIMI flow is 3. Post-intervention TIMI flow is 3. No complications occurred at this lesion. There is a 0% residual stenosis post intervention.  Dist Graft lesion (Graft To Dist RCA) Stent (Also treats lesions: Mid Graft) See details in Mid Graft lesion (Graft To Dist RCA). Post-Intervention Lesion Assessment The intervention was successful. Pre-interventional TIMI flow is 3. Post-intervention TIMI flow is 3. No complications occurred at this lesion. There is a 0% residual stenosis post intervention.   CARDIAC CATHETERIZATION 05/08/2022  Narrative   Ost LAD to Prox LAD lesion is 30% stenosed.   Ramus lesion is 40% stenosed.   Prox Cx to Mid  Cx lesion is 75% stenosed.   Mid RCA lesion is 70% stenosed.   Origin lesion is 85% stenosed.   Prox Graft to Mid Graft lesion is 40% stenosed.   Mid Graft lesion is 60% stenosed.   Dist Graft lesion is 70% stenosed.   Dist LAD lesion is 30% stenosed.   Balloon angioplasty was performed.   Post intervention, there is a 25% residual stenosis.   LV end diastolic pressure is low.  Acute coronary syndrome with transient ST segment elevation in the inferior and anterolateral leads secondary to high-grade stenosis in a stent in the proximal SVG supplying the distal RCA.  The left main coronary artery is widely patent status post  remote intervention.  The LAD has 30% smooth proximal stenosis and 30% distal stenosis.  There is antegrade flow down the diagonal and LAD without competitive filling.  The ramus intermediate vessel has smooth 40% proximal stenosis.  Left circumflex vessel has eccentric 75% stenosis on a bend in the vessel.  The native RCA has a previously placed stent region of the acute margin and it appears that the distal RCA does not feel from the segment.  The SVG supplying the distal RCA is patent but diffusely diseased.  There appears to be a stent in the very proximal portion of the graft with focal 85 to 90% stenosis just at the beginning of this stent.  The RCA graft has diffuse 40 6070% stenoses throughout.  The vessel fills the distal RCA PDA and PLA branches.  Atretic LIMA graft which had supplied the LAD.  Occluded vein graft which had supplied the circumflex vessel.  Occluded vein graft which had supplied the diagonal vessel.  LVEDP 6 mmHg.  Successful percutaneous coronary intervention with PTCA in the very proximal portion of the previously placed SVG stent with the 85 to 90% stenosis being reduced to 25%.  RECOMMENDATON: Continue DAPT indefinitely.  Apparently, Plavix had been held for several days due to the patient's recent lower GI bleed which was successfully treated with coil embolization by interventional radiology.  Aggressive lipid management with target LDL < 55.  Findings Coronary Findings Diagnostic  Dominance: Right  Left Anterior Descending Ost LAD to Prox LAD lesion is 30% stenosed. Dist LAD lesion is 30% stenosed.  Ramus Intermedius Ramus lesion is 40% stenosed.  Left Circumflex Prox Cx to Mid Cx lesion is 75% stenosed.  Right Coronary Artery Mid RCA lesion is 70% stenosed. The lesion was previously treated .  Graft To Dist RCA Origin lesion is 85% stenosed. The lesion was previously treated . Prox Graft to Mid Graft lesion is 40% stenosed. Mid Graft lesion  is 60% stenosed. Dist Graft lesion is 70% stenosed.  Intervention  Origin lesion (Graft To Dist RCA) Angioplasty Balloon angioplasty was performed. Post-Intervention Lesion Assessment The intervention was successful. Pre-interventional TIMI flow is 3. Post-intervention TIMI flow is 3. There is a 25% residual stenosis post intervention.   STRESS TESTS  MYOCARDIAL PERFUSION IMAGING 02/22/2021  Narrative   Findings are consistent with no ischemia. The study is low risk.   No ST deviation was noted.   LV perfusion is abnormal. Defect 1: There is a small defect with moderate reduction in uptake present in the mid inferior location(s) that is fixed. There is normal wall motion in the defect area. Consistent with artifact caused by bowel tracer uptake.   Left ventricular function is normal. Nuclear stress EF: 60 %. The left ventricular ejection fraction is normal (55-65%). End diastolic cavity  size is normal. End systolic cavity size is normal.   Prior study available for comparison from 10/08/2013. No changes compared to prior study.  Low risk study, no evidence of ischemia. Fixed defect in mid inferior wall adjacent to high extracardiac counts, consistent with artifact.   ECHOCARDIOGRAM  ECHOCARDIOGRAM COMPLETE 03/29/2022  Narrative ECHOCARDIOGRAM REPORT    Patient Name:   Karen [name] Date of Exam: 03/29/2022 Medical Rec #:  [id number]        Height:       63.5 in Accession #:    [id number]       Weight:       153.0 lb Date of Birth:  May 21, 1945         BSA:          1.736 m Patient Age:    76 years         BP:           120/68 mmHg Patient Gender: F                HR:           66 bpm. Exam Location:  [address]  Procedure: 2D Echo, Cardiac Doppler and Color Doppler  Indications:    R06.09 Dyspnea on exertion  History:        Patient has prior history of Echocardiogram examinations, most recent 01/29/2016. CAD and Previous Myocardial Infarction, Defibrillator and Prior  CABG; Risk Factors:Hypertension and Dyslipidemia.  Sonographer:    Cathie Beams RCS Referring Phys: Corky Crafts  IMPRESSIONS   1. Left ventricular ejection fraction, by estimation, is 50 to 55%. The left ventricle has low normal function. The left ventricle demonstrates regional wall motion abnormalities (see scoring diagram/findings for description). Left ventricular diastolic parameters were normal. 2. Right ventricular systolic function is normal. The right ventricular size is normal. 3. The mitral valve is normal in structure. Mild mitral valve regurgitation. No evidence of mitral stenosis. 4. The aortic valve is tricuspid. Aortic valve regurgitation is not visualized. No aortic stenosis is present. 5. The inferior vena cava is normal in size with greater than 50% respiratory variability, suggesting right atrial pressure of 3 mmHg.  FINDINGS Left Ventricle: Left ventricular ejection fraction, by estimation, is 50 to 55%. The left ventricle has low normal function. The left ventricle demonstrates regional wall motion abnormalities. The left ventricular internal cavity size was normal in size. There is no left ventricular hypertrophy. Left ventricular diastolic parameters were normal.   LV Wall Scoring: The inferior wall is akinetic.  Right Ventricle: The right ventricular size is normal. No increase in right ventricular wall thickness. Right ventricular systolic function is normal.  Left Atrium: Left atrial size was normal in size.  Right Atrium: Right atrial size was normal in size.  Pericardium: There is no evidence of pericardial effusion.  Mitral Valve: The mitral valve is normal in structure. Mild mitral valve regurgitation. No evidence of mitral valve stenosis.  Tricuspid Valve: The tricuspid valve is normal in structure. Tricuspid valve regurgitation is not demonstrated. No evidence of tricuspid stenosis.  Aortic Valve: The aortic valve is tricuspid. Aortic  valve regurgitation is not visualized. No aortic stenosis is present.  Pulmonic Valve: The pulmonic valve was normal in structure. Pulmonic valve regurgitation is not visualized. No evidence of pulmonic stenosis.  Aorta: The aortic root is normal in size and structure.  Venous: The inferior vena cava is normal in size with greater than 50% respiratory variability, suggesting right atrial  pressure of 3 mmHg.  IAS/Shunts: No atrial level shunt detected by color flow Doppler.  Additional Comments: A device lead is visualized in the right ventricle.   LEFT VENTRICLE PLAX 2D LVIDd:         3.80 cm   Diastology LVIDs:         2.30 cm   LV e' medial:    6.53 cm/s LV PW:         0.90 cm   LV E/e' medial:  16.7 LV IVS:        0.90 cm   LV e' lateral:   14.40 cm/s LVOT diam:     1.80 cm   LV E/e' lateral: 7.6 LV SV:         59 LV SV Index:   34 LVOT Area:     2.54 cm   RIGHT VENTRICLE RV Basal diam:  2.70 cm RV S prime:     6.41 cm/s TAPSE (M-mode): 1.2 cm  LEFT ATRIUM             Index        RIGHT ATRIUM           Index LA diam:        3.20 cm 1.84 cm/m   RA Area:     12.70 cm LA Vol (A2C):   34.4 ml 19.82 ml/m  RA Volume:   28.60 ml  16.48 ml/m LA Vol (A4C):   29.3 ml 16.88 ml/m LA Biplane Vol: 31.9 ml 18.38 ml/m AORTIC VALVE LVOT Vmax:   117.00 cm/s LVOT Vmean:  66.800 cm/s LVOT VTI:    0.230 m  AORTA Ao Root diam: 2.90 cm Ao Asc diam:  2.70 cm  MITRAL VALVE                TRICUSPID VALVE MV Area (PHT): 4.31 cm     TR Peak grad:   21.5 mmHg MV Decel Time: 176 msec     TR Vmax:        232.00 cm/s MV E velocity: 109.00 cm/s MV A velocity: 82.00 cm/s   SHUNTS MV E/A ratio:  1.33         Systemic VTI:  0.23 m Systemic Diam: 1.80 cm  Donato Schultz MD Electronically signed by Donato Schultz MD Signature Date/Time: 03/29/2022/3:49:31 PM    Final          ______________________________________________________________________________________________      EKG:         Recent Labs: 05/06/2023: ALT 13; BUN 21; Creatinine 1.42; Hemoglobin 13.0; Platelet Count 187; Potassium 4.0; Sodium 140  Recent Lipid Panel    Component Value Date/Time   CHOL 142 06/06/2022 1418   TRIG 41 06/06/2022 1418   HDL 71 06/06/2022 1418   CHOLHDL 2.0 06/06/2022 1418   CHOLHDL 2.5 05/10/2022 0038   VLDL 10 05/10/2022 0038   LDLCALC 61 06/06/2022 1418   LDLDIRECT 82 01/15/2018 1536            Physical Exam:    VS:  BP 110/82   Pulse 77   Ht 5\' 3"  (1.6 m)   Wt 147 lb (66.7 kg)   SpO2 99%   BMI 26.04 kg/m     Wt Readings from Last 3 Encounters:  07/15/23 147 lb (66.7 kg)  06/30/23 148 lb 2.4 oz (67.2 kg)  05/14/23 148 lb 1.6 oz (67.2 kg)     GEN:  Well nourished, well developed in no acute distress HEENT: Normal  NECK: No JVD; No carotid bruits LYMPHATICS: No lymphadenopathy CARDIAC: RRR, no murmurs, rubs, gallops RESPIRATORY:  Clear to auscultation without rales, wheezing or rhonchi  ABDOMEN: Soft, non-tender, non-distended MUSCULOSKELETAL:  No edema; No deformity  SKIN: Warm and dry NEUROLOGIC:  Alert and oriented x 3 PSYCHIATRIC:  Normal affect   Assessment & Plan Angina pectoris (HCC) The patient is maintained on long-term antiplatelet therapy with clopidogrel.  Her antianginal regimen includes amlodipine, isosorbide, ranolazine, and metoprolol.  She is having some breakthrough anginal symptoms and I have recommended a Lexiscan Myoview stress test for further risk stratification.  Her current medications will be continued.  Will determine whether we might need to proceed with repeat cardiac catheterization based on her Myoview result and how her symptoms evolve over time.  I will arrange 80-month APP follow-up. NSVT (nonsustained ventricular tachycardia) (HCC) Followed by Dr. Graciela Husbands.  Patient status post ICD.  Most recent device interrogation from May 02, 2023 reviewed with estimated longevity 3.5 years and no episodes noted.  Continue  beta-blocker. Essential hypertension Blood pressure under optimal control on a combination of amlodipine, lisinopril, isosorbide, furosemide, and metoprolol tartrate. Hyperlipidemia LDL goal <70 Treated with rosuvastatin 20 mg daily.  LDL cholesterol is 61 mg/dL.       Informed Consent   Shared Decision Making/Informed Consent The risks [chest pain, shortness of breath, cardiac arrhythmias, dizziness, blood pressure fluctuations, myocardial infarction, stroke/transient ischemic attack, nausea, vomiting, allergic reaction, radiation exposure, metallic taste sensation and life-threatening complications (estimated to be 1 in 10,000)], benefits (risk stratification, diagnosing coronary artery disease, treatment guidance) and alternatives of a nuclear stress test were discussed in detail with Ms. Brucks and she agrees to proceed.       Medication Adjustments/Labs and Tests Ordered: Current medicines are reviewed at length with the patient today.  Concerns regarding medicines are outlined above.  Orders Placed This Encounter  Procedures   MYOCARDIAL PERFUSION IMAGING   Meds ordered this encounter  Medications   amLODipine (NORVASC) 5 MG tablet    Sig: Take 1 tablet (5 mg total) by mouth daily.    Dispense:  90 tablet    Refill:  3   furosemide (LASIX) 40 MG tablet    Sig: Take 1 tablet (40 mg total) by mouth daily.    Dispense:  90 tablet    Refill:  3   metoprolol tartrate (LOPRESSOR) 25 MG tablet    Sig: Take 0.5 tablets (12.5 mg total) by mouth 2 (two) times daily.    Dispense:  90 tablet    Refill:  3   ranolazine (RANEXA) 500 MG 12 hr tablet    Sig: Take 1 tablet (500 mg total) by mouth 2 (two) times daily.    Dispense:  180 tablet    Refill:  3   isosorbide mononitrate (IMDUR) 60 MG 24 hr tablet    Sig: Take 1 tablet (60 mg total) by mouth 2 (two) times daily.    Dispense:  180 tablet    Refill:  3    Patient Instructions  Testing/Procedures: Lexiscan Myoview stress  test Your physician has requested that you have a lexiscan myoview. For further information please visit [email or web address] Please follow instruction sheet, as given.  Follow-Up: At [facility], you and your health needs are our priority.  As part of our continuing mission to provide you with exceptional heart care, we have created designated Provider Care Teams.  These Care Teams include your primary Cardiologist (physician) and Advanced Practice Providers (APPs -  Physician Assistants and Nurse Practitioners) who all work together to provide you with the care you need, when you need it.  We recommend signing up for the patient portal called "MyChart".  Sign up information is provided on this After Visit Summary.  MyChart is used to connect with patients for Virtual Visits (Telemedicine).  Patients are able to view lab/test results, encounter notes, upcoming appointments, etc.  Non-urgent messages can be sent to your provider as well.   To learn more about what you can do with MyChart, go to [email or web address].    Your next appointment:   6 month(s)  Provider:   Jari Favre, PA-C, Ronie Spies, PA-C, Robin Searing, NP, Tereso Newcomer, PA-C, or Perlie Gold, PA-C     Then, Tonny Bollman, MD will plan to see you again in 1 year(s).   1st Floor: - Lobby - Registration  - Pharmacy  - Lab - Cafe  2nd Floor: - PV Lab - Diagnostic Testing (echo, CT, nuclear med)  3rd Floor: - Vacant  4th Floor: - TCTS (cardiothoracic surgery) - AFib Clinic - Structural Heart Clinic - Vascular Surgery  - Vascular Ultrasound  5th Floor: - HeartCare Cardiology (general and EP) - Clinical Pharmacy for coumadin, hypertension, lipid, weight-loss medications, and med management appointments    Valet parking services will be available as well.     Signed, Tonny Bollman, MD  07/17/2023 7:47 AM    [facility]

## 2023-07-15 NOTE — Assessment & Plan Note (Signed)
 The patient is maintained on long-term antiplatelet therapy with clopidogrel.  Her antianginal regimen includes amlodipine, isosorbide, ranolazine, and metoprolol.  She is having some breakthrough anginal symptoms and I have recommended a Lexiscan Myoview stress test for further risk stratification.  Her current medications will be continued.  Will determine whether we might need to proceed with repeat cardiac catheterization based on her Myoview result and how her symptoms evolve over time.  I will arrange 67-month APP follow-up.

## 2023-07-15 NOTE — Patient Instructions (Signed)
 Testing/Procedures: Lexiscan Myoview stress test Your physician has requested that you have a lexiscan myoview. For further information please visit [email or web address] Please follow instruction sheet, as given.  Follow-Up: At [facility], you and your health needs are our priority.  As part of our continuing mission to provide you with exceptional heart care, we have created designated Provider Care Teams.  These Care Teams include your primary Cardiologist (physician) and Advanced Practice Providers (APPs -  Physician Assistants and Nurse Practitioners) who all work together to provide you with the care you need, when you need it.  We recommend signing up for the patient portal called "MyChart".  Sign up information is provided on this After Visit Summary.  MyChart is used to connect with patients for Virtual Visits (Telemedicine).  Patients are able to view lab/test results, encounter notes, upcoming appointments, etc.  Non-urgent messages can be sent to your provider as well.   To learn more about what you can do with MyChart, go to [email or web address].    Your next appointment:   6 month(s)  Provider:   Jari Favre, PA-C, Ronie Spies, PA-C, Robin Searing, NP, Tereso Newcomer, PA-C, or Perlie Gold, PA-C     Then, Tonny Bollman, MD will plan to see you again in 1 year(s).   1st Floor: - Lobby - Registration  - Pharmacy  - Lab - Cafe  2nd Floor: - PV Lab - Diagnostic Testing (echo, CT, nuclear med)  3rd Floor: - Vacant  4th Floor: - TCTS (cardiothoracic surgery) - AFib Clinic - Structural Heart Clinic - Vascular Surgery  - Vascular Ultrasound  5th Floor: - HeartCare Cardiology (general and EP) - Clinical Pharmacy for coumadin, hypertension, lipid, weight-loss medications, and med management appointments    Valet parking services will be available as well.

## 2023-07-15 NOTE — Assessment & Plan Note (Signed)
 Followed by Dr. Graciela Husbands.  Patient status post ICD.  Most recent device interrogation from May 02, 2023 reviewed with estimated longevity 3.5 years and no episodes noted.  Continue beta-blocker.

## 2023-07-15 NOTE — Assessment & Plan Note (Signed)
 Blood pressure under optimal control on a combination of amlodipine, lisinopril, isosorbide, furosemide, and metoprolol tartrate.

## 2023-07-24 ENCOUNTER — Telehealth: Payer: Self-pay | Admitting: Medical Oncology

## 2023-07-24 LAB — CUP PACEART REMOTE DEVICE CHECK
Battery Remaining Longevity: 42 mo
Battery Remaining Percentage: 44 %
Brady Statistic RV Percent Paced: 0 %
Date Time Interrogation Session: 20250327045500
HighPow Impedance: 66 Ohm
Implantable Lead Connection Status: 753985
Implantable Lead Implant Date: 20071205
Implantable Lead Location: 753860
Implantable Lead Model: 137
Implantable Lead Serial Number: 102363
Implantable Pulse Generator Implant Date: 20141105
Lead Channel Impedance Value: 430 Ohm
Lead Channel Pacing Threshold Amplitude: 0.5 V
Lead Channel Pacing Threshold Pulse Width: 0.5 ms
Lead Channel Setting Pacing Amplitude: 2 V
Lead Channel Setting Pacing Pulse Width: 0.5 ms
Lead Channel Setting Sensing Sensitivity: 0.6 mV
Pulse Gen Serial Number: 126146

## 2023-07-24 NOTE — Telephone Encounter (Signed)
 Calling back for CT appt.

## 2023-07-28 ENCOUNTER — Ambulatory Visit (INDEPENDENT_AMBULATORY_CARE_PROVIDER_SITE_OTHER): Payer: 59

## 2023-07-28 DIAGNOSIS — I4901 Ventricular fibrillation: Secondary | ICD-10-CM

## 2023-07-29 ENCOUNTER — Telehealth (HOSPITAL_COMMUNITY): Payer: Self-pay

## 2023-07-29 NOTE — Telephone Encounter (Signed)
 Spoke to the patient, detailed instructions given. She stated that she would be here for her test. S.Cylus [place] CCT

## 2023-08-01 IMAGING — DX DG CHEST 1V PORT
1 series · 1 of 1 positions shown · non-contrast
Comparison: 10/27/2007; PET-CT-12/08/2020

CLINICAL DATA: Post CT-guided left lower lobe pulmonary nodule
biopsy.

EXAM:
PORTABLE CHEST 1 VIEW

[chest ap]
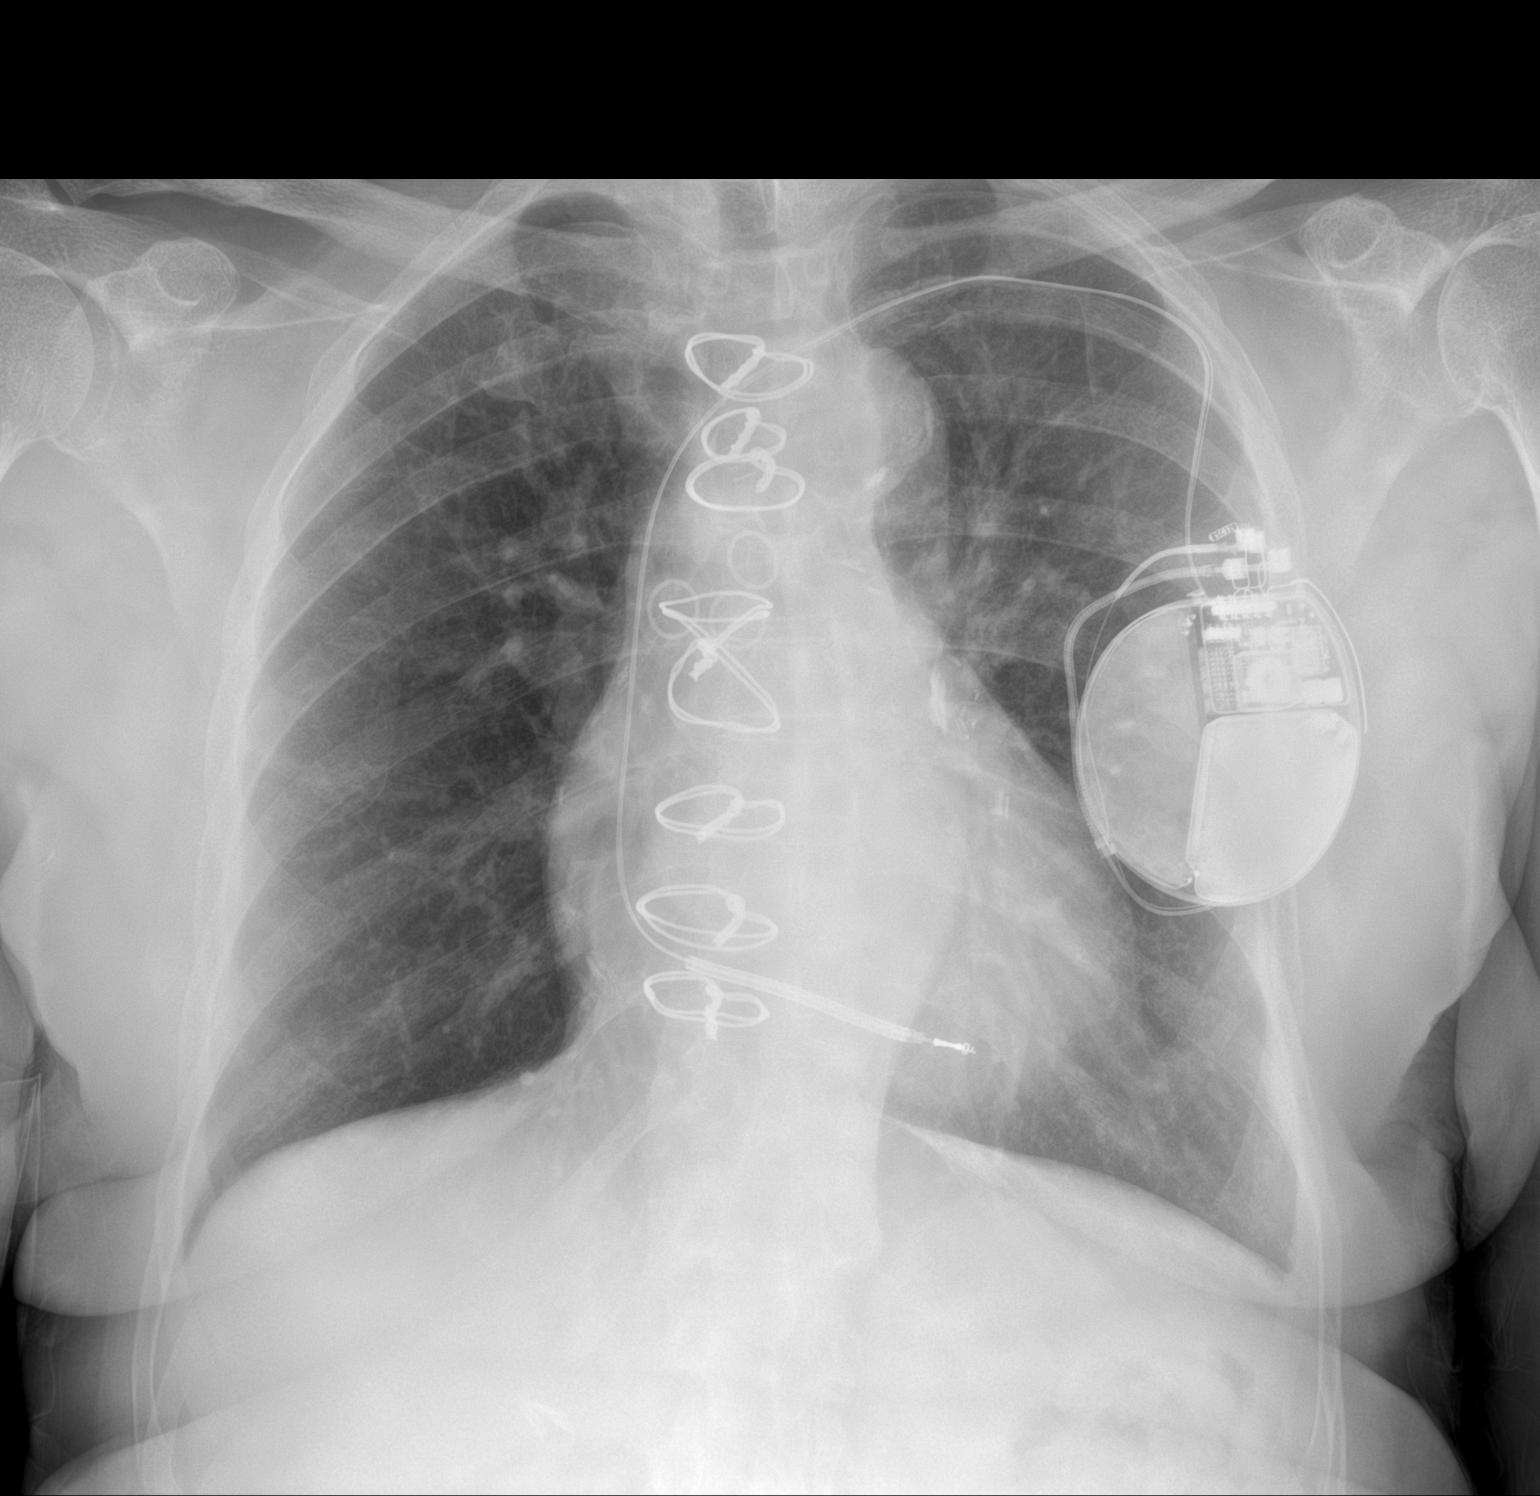

[1 of 1 positions shown; findings below may reference images not displayed]

FINDINGS: Grossly unchanged cardiac silhouette and mediastinal contours post
median sternotomy and CABG. Atherosclerotic plaque within the
thoracic aorta. Stable position of support apparatus.

No evidence of complication following CT-guided left lower lobe
pulmonary nodule biopsy. Specifically, no pneumothorax or pleural
effusion.

No focal airspace opacities. No evidence of edema. No acute osseous
abnormalities.
IMPRESSION: No evidence of complication following CT-guided left lower lobe
pulmonary nodule biopsy. Specifically, no pneumothorax.

## 2023-08-01 IMAGING — CT CT BIOPSY
1 of 4 series · 11 of 32 positions shown, 17 images · non-contrast
Comparison: PET-CT-12/08/2020

INDICATION: No known primary, now with hypermetabolic pulmonary nodules
worrisome for synchronous lung cancer. Please perform CT-guided
biopsy for tissue diagnostic purposes.

EXAM:
CT-GUIDED LEFT LOWER LOBE PULMONARY NODULE BIOPSY

[Series 3: i-spiral 5.0 b40f · axial · 0.71mm/px · z∈[+1127,+1372]mm · 11 of 85 slices shown, 17 images]
[im 8/85  soft-tissue]
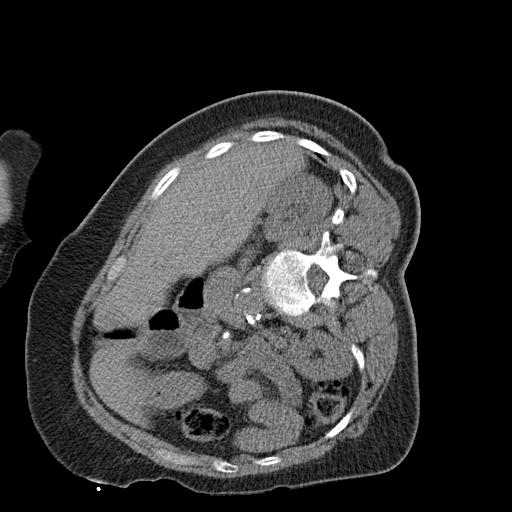
[im 8/85  bone]
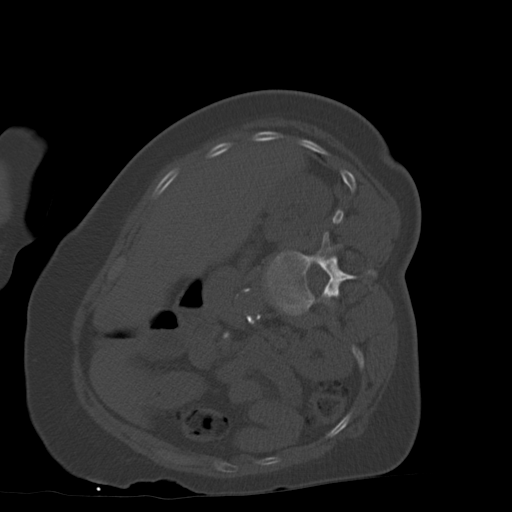
[im 15/85  soft-tissue]
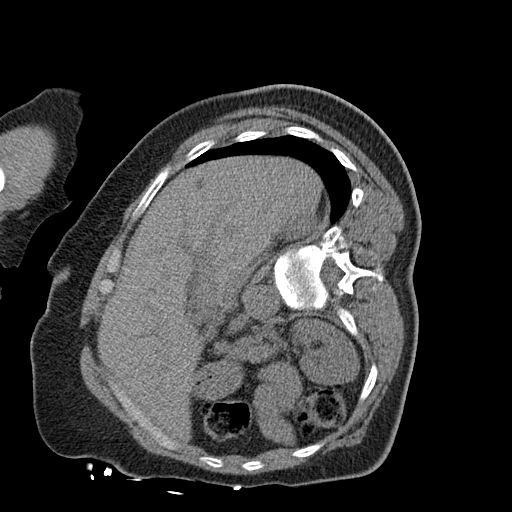
[im 22/85  soft-tissue]
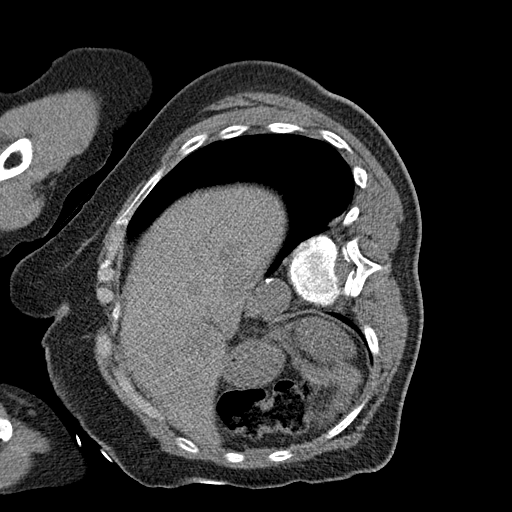
[im 29/85  soft-tissue]
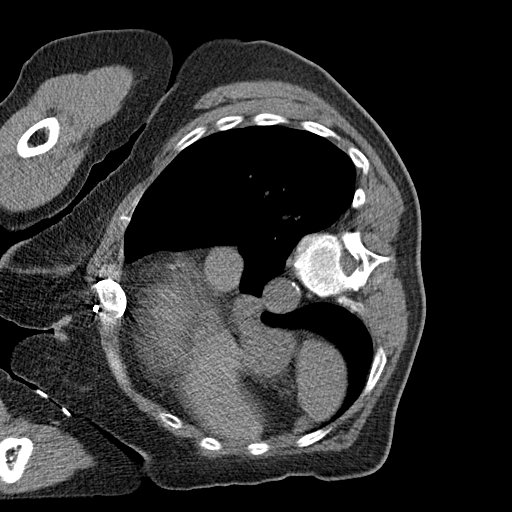
[im 36/85  soft-tissue]
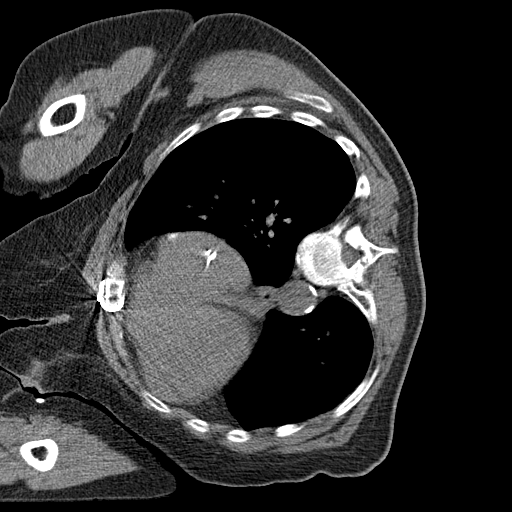
[im 43/85  soft-tissue]
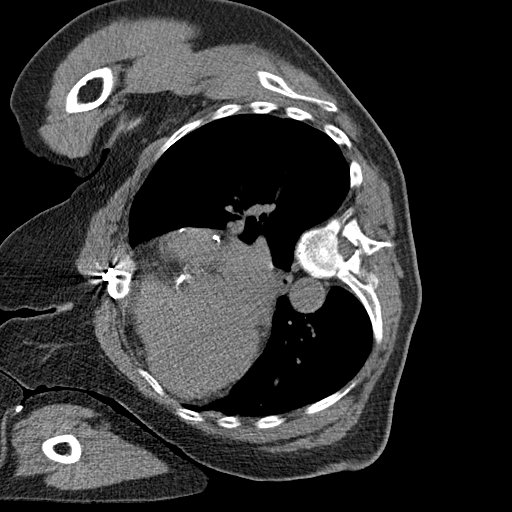
[im 50/85  soft-tissue]
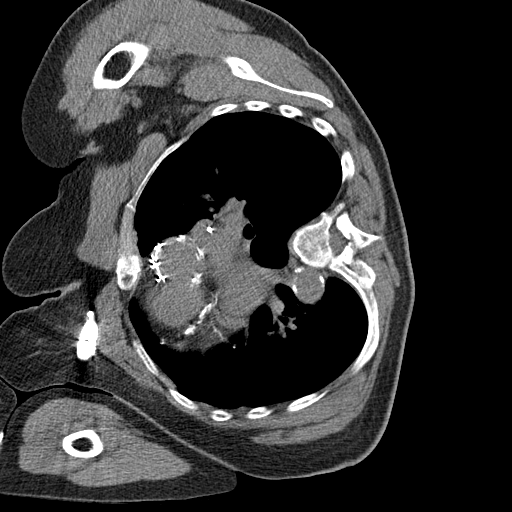
[im 57/85  soft-tissue]
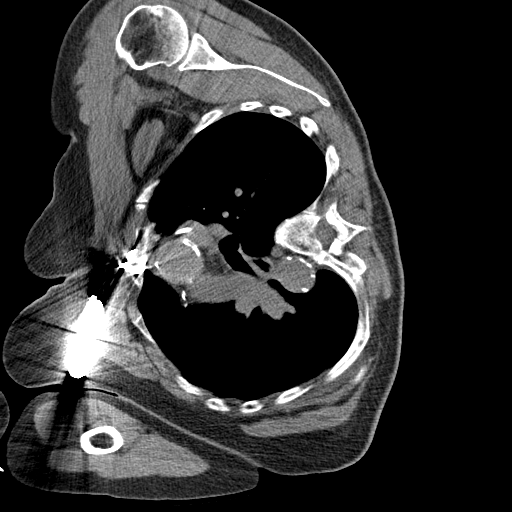
[im 57/85  lung]
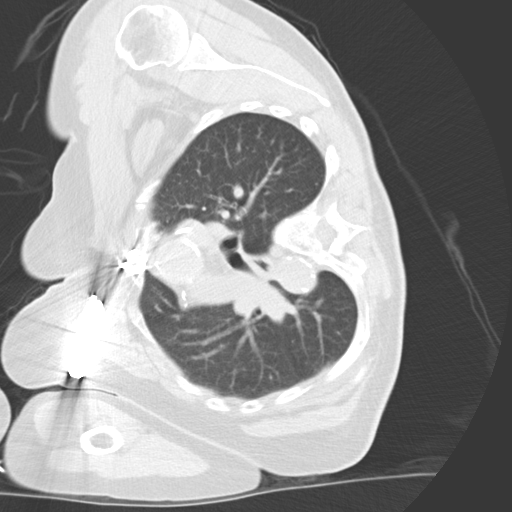
[im 64/85  soft-tissue]
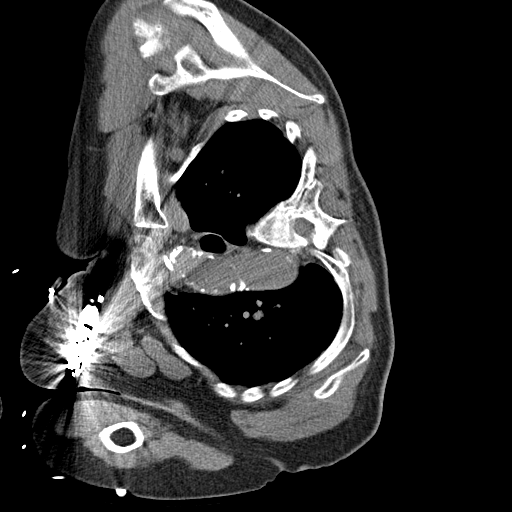
[im 64/85  lung]
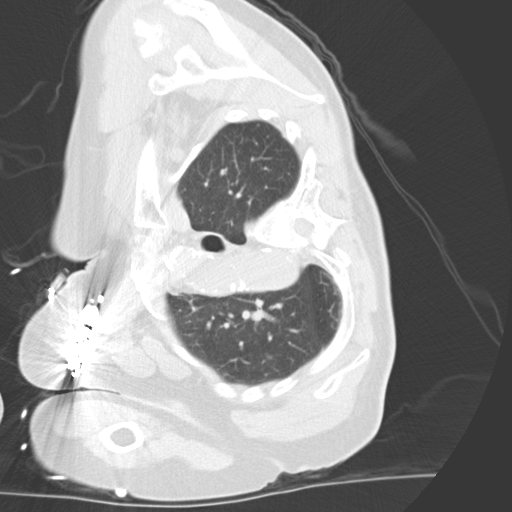
[im 64/85  bone]
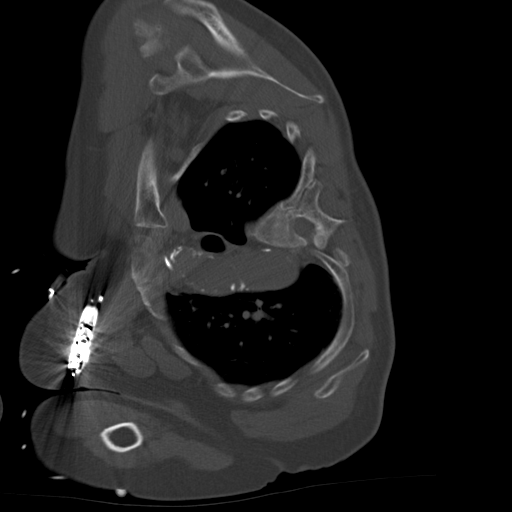
[im 71/85  soft-tissue]
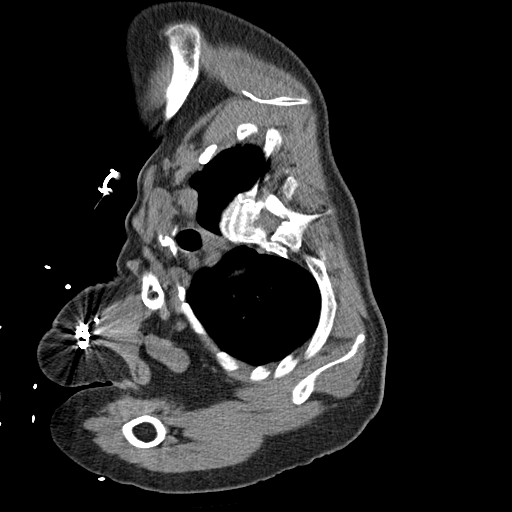
[im 71/85  lung]
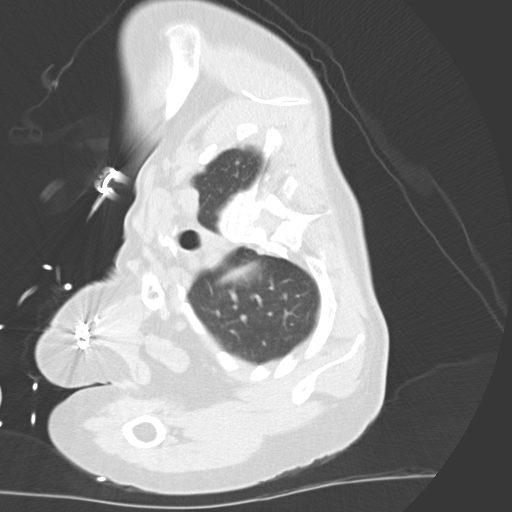
[im 78/85  soft-tissue]
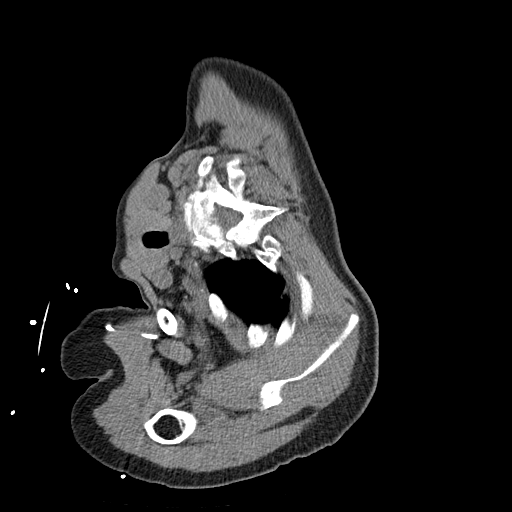
[im 78/85  lung]
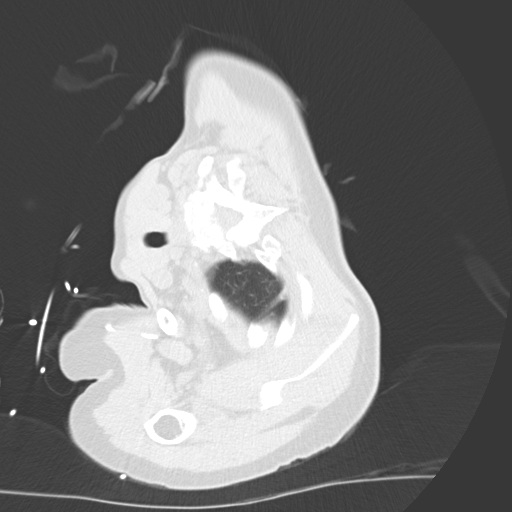

[11 of 32 positions shown; findings below may reference images not displayed]

MEDICATIONS:
None.

ANESTHESIA/SEDATION:
Fentanyl 50 mcg IV; Versed 1 mg IV

Sedation time: 17 minutes; The patient was continuously monitored
during the procedure by the interventional radiology nurse under my
direct supervision.

CONTRAST:  None

COMPLICATIONS:
None immediate.

PROCEDURE:
Informed consent was obtained from the patient following an
explanation of the procedure, risks, benefits and alternatives. The
patient understands,agrees and consents for the procedure. All
questions were addressed. A time out was performed prior to the
initiation of the procedure.

The patient was positioned left lateral decubitus on the CT table
and a limited chest CT was performed for procedural planning
demonstrating unchanged size and appearance of the approximately
x 0.9 cm subpleural nodule within the superior segment of the left
lower lobe (image 25, series 3). The operative site was prepped and
draped in the usual sterile fashion. Under sterile conditions and
local anesthesia, a 17 gauge coaxial needle was advanced into the
peripheral aspect of the nodule. Positioning was confirmed with
intermittent CT fluoroscopy and followed by the acquisition of 2
with an 18 gauge core needle biopsy device. The coaxial needle was
removed following deployment of a [employer] plug and superficial
hemostasis was achieved with manual compression.

Limited postprocedural imaging demonstrates a minimal amount of
asymptomatic peri biopsy alveolar hemorrhage. A dressing was
applied. The patient tolerated the procedure well without immediate
postprocedural complication. The patient was escorted to have an
upright chest radiograph.
IMPRESSION: Technically successful CT guided core needle core biopsy of
hypermetabolic left lower lobe pulmonary nodule.

## 2023-08-04 ENCOUNTER — Inpatient Hospital Stay: Payer: 59 | Attending: Internal Medicine

## 2023-08-04 ENCOUNTER — Ambulatory Visit (HOSPITAL_COMMUNITY)
Admission: RE | Admit: 2023-08-04 | Discharge: 2023-08-04 | Disposition: A | Discharge status: Home / Self Care | Source: Ambulatory Visit | Attending: Internal Medicine | Admitting: Internal Medicine

## 2023-08-04 DIAGNOSIS — N6452 Nipple discharge: Secondary | ICD-10-CM | POA: Insufficient documentation

## 2023-08-04 DIAGNOSIS — R262 Difficulty in walking, not elsewhere classified: Secondary | ICD-10-CM | POA: Insufficient documentation

## 2023-08-04 DIAGNOSIS — C3490 Malignant neoplasm of unspecified part of unspecified bronchus or lung: Secondary | ICD-10-CM

## 2023-08-04 DIAGNOSIS — Z85118 Personal history of other malignant neoplasm of bronchus and lung: Secondary | ICD-10-CM | POA: Insufficient documentation

## 2023-08-04 LAB — CBC WITH DIFFERENTIAL (CANCER CENTER ONLY)
Abs Immature Granulocytes: 0.01 10*3/uL (ref 0.00–0.07)
Basophils Absolute: 0 10*3/uL (ref 0.0–0.1)
Basophils Relative: 0 %
Eosinophils Absolute: 0.1 10*3/uL (ref 0.0–0.5)
Eosinophils Relative: 2 %
HCT: 38.8 % (ref 36.0–46.0)
Hemoglobin: 13.2 g/dL (ref 12.0–15.0)
Immature Granulocytes: 0 %
Lymphocytes Relative: 41 %
Lymphs Abs: 1.9 10*3/uL (ref 0.7–4.0)
MCH: 31.5 pg (ref 26.0–34.0)
MCHC: 34 g/dL (ref 30.0–36.0)
MCV: 92.6 fL (ref 80.0–100.0)
Monocytes Absolute: 0.3 10*3/uL (ref 0.1–1.0)
Monocytes Relative: 7 %
Neutro Abs: 2.3 10*3/uL (ref 1.7–7.7)
Neutrophils Relative %: 50 %
Platelet Count: 196 10*3/uL (ref 150–400)
RBC: 4.19 MIL/uL (ref 3.87–5.11)
RDW: 12.2 % (ref 11.5–15.5)
WBC Count: 4.6 10*3/uL (ref 4.0–10.5)
nRBC: 0 % (ref 0.0–0.2)

## 2023-08-04 LAB — CMP (CANCER CENTER ONLY)
ALT: 9 U/L (ref 0–44)
AST: 15 U/L (ref 15–41)
Albumin: 4.6 g/dL (ref 3.5–5.0)
Alkaline Phosphatase: 77 U/L (ref 38–126)
Anion gap: 8 (ref 5–15)
BUN: 15 mg/dL (ref 8–23)
CO2: 28 mmol/L (ref 22–32)
Calcium: 9.9 mg/dL (ref 8.9–10.3)
Chloride: 104 mmol/L (ref 98–111)
Creatinine: 1.32 mg/dL — ABNORMAL HIGH (ref 0.44–1.00)
GFR, Estimated: 42 mL/min — ABNORMAL LOW (ref >60)
Glucose, Bld: 82 mg/dL (ref 70–99)
Potassium: 3.3 mmol/L — ABNORMAL LOW (ref 3.5–5.1)
Sodium: 140 mmol/L (ref 135–145)
Total Bilirubin: 0.5 mg/dL (ref 0.0–1.2)
Total Protein: 7.7 g/dL (ref 6.5–8.1)

## 2023-08-04 MED ORDER — SODIUM CHLORIDE (PF) 0.9 % IJ SOLN
INTRAMUSCULAR | Status: AC
  Filled 2023-08-04: qty 50

## 2023-08-04 MED ORDER — IOHEXOL 300 MG/ML  SOLN
75.0000 mL | Freq: Once | INTRAMUSCULAR | Status: AC | PRN
  Administered 2023-08-04: 75 mL via INTRAVENOUS

## 2023-08-05 ENCOUNTER — Ambulatory Visit (HOSPITAL_COMMUNITY): Attending: Cardiovascular Disease

## 2023-08-05 DIAGNOSIS — E785 Hyperlipidemia, unspecified: Secondary | ICD-10-CM | POA: Insufficient documentation

## 2023-08-05 DIAGNOSIS — I10 Essential (primary) hypertension: Secondary | ICD-10-CM | POA: Insufficient documentation

## 2023-08-05 DIAGNOSIS — I4729 Other ventricular tachycardia: Secondary | ICD-10-CM | POA: Insufficient documentation

## 2023-08-05 DIAGNOSIS — I209 Angina pectoris, unspecified: Secondary | ICD-10-CM | POA: Insufficient documentation

## 2023-08-05 LAB — MYOCARDIAL PERFUSION IMAGING
LV dias vol: 52 mL (ref 46–106)
LV sys vol: 20 mL
Nuc Stress EF: 63 %
Peak HR: 93 {beats}/min
Rest HR: 71 {beats}/min
Rest Nuclear Isotope Dose: 10.5 mCi
SDS: 2
SRS: 0
SSS: 2
ST Depression (mm): 0 mm
Stress Nuclear Isotope Dose: 32.7 mCi
TID: 0.98

## 2023-08-05 MED ORDER — REGADENOSON 0.4 MG/5ML IV SOLN
0.4000 mg | Freq: Once | INTRAVENOUS | Status: AC
  Administered 2023-08-05: 0.4 mg via INTRAVENOUS

## 2023-08-05 MED ORDER — TECHNETIUM TC 99M TETROFOSMIN IV KIT
32.7000 | PACK | Freq: Once | INTRAVENOUS | Status: AC | PRN
  Administered 2023-08-05: 32.7 via INTRAVENOUS

## 2023-08-05 MED ORDER — TECHNETIUM TC 99M TETROFOSMIN IV KIT
10.5000 | PACK | Freq: Once | INTRAVENOUS | Status: AC | PRN
  Administered 2023-08-05: 10.5 via INTRAVENOUS

## 2023-08-11 ENCOUNTER — Inpatient Hospital Stay (HOSPITAL_BASED_OUTPATIENT_CLINIC_OR_DEPARTMENT_OTHER): Payer: 59 | Admitting: Internal Medicine

## 2023-08-11 VITALS — BP 112/63 | HR 78 | Temp 98.1°F | Resp 17 | Ht 63.0 in | Wt 145.0 lb

## 2023-08-11 DIAGNOSIS — C3490 Malignant neoplasm of unspecified part of unspecified bronchus or lung: Secondary | ICD-10-CM

## 2023-08-11 DIAGNOSIS — N6452 Nipple discharge: Secondary | ICD-10-CM | POA: Diagnosis not present

## 2023-08-11 DIAGNOSIS — Z85118 Personal history of other malignant neoplasm of bronchus and lung: Secondary | ICD-10-CM | POA: Diagnosis present

## 2023-08-11 DIAGNOSIS — R262 Difficulty in walking, not elsewhere classified: Secondary | ICD-10-CM | POA: Diagnosis not present

## 2023-08-11 NOTE — Progress Notes (Signed)
 [facility] [phone]   Fax:([phone]  OFFICE PROGRESS NOTE  Center, [facility] [address] [place] [address]  DIAGNOSIS: Stage IB (T1b, N0, M0) non-small cell lung cancer, adenocarcinoma presented with left lower lobe lung nodule diagnosed in September 2022.   PRIOR THERAPY:  1) SBRT under the care of Dr. Roselind Messier. Last treatment on 02/15/21.   2) SBRT to the right lower lobe lung nodule under the care of Dr. Roselind Messier in October 2024.  CURRENT THERAPY: Observation   INTERVAL HISTORY: Karen [name] 78 y.o. female returns to the clinic today for follow-up visit.Discussed the use of AI scribe software for clinical note transcription with the patient, who gave verbal consent to proceed.  History of Present Illness   Karen [name] is a 78 year old female with stage 1B non-small cell lung cancer who presents for evaluation with repeat CT scan.  She has a history of stage 1B non-small cell lung cancer with a left lower lobe lung nodule identified in September 2022. She underwent SBRT in October 2022 and has been under observation since. The recent CT scan of the chest revealed an increase in size of a nodule in the right lower lobe, from 2.0 x 1.3 cm to 2.4 x 2.0 cm, while the left side remains unchanged post-radiation treatment.  She experiences a sensation of tightness on the left side, which persists even after removing her bra. Additionally, she notes occasional clear discharge from her breast, which she wipes away. Her last mammogram was approximately a year and a half ago.  She mentions difficulty walking due to an undiagnosed issue with her right leg, for which she uses a cane.        MEDICAL HISTORY: Past Medical History:  Diagnosis Date   Acute myocardial infarction, unspecified site, episode of care unspecified    Adenocarcinoma, lung (HCC) 02/16/2021   Allergic rhinitis, cause unspecified    CAD (coronary artery  disease)    GERD (gastroesophageal reflux disease)    Headache(784.0)    History of radiation therapy    left lung SBRT 02/08/2021, 02/13/2021, 02/15/2021  Dr Antony Blackbird   History of radiation therapy    RIght lung- 02/04/23-02/12/23-Dr. Antony Blackbird   HTN (hypertension)    Other and unspecified hyperlipidemia    Other diseases of lung, not elsewhere classified    Postsurgical aortocoronary bypass status    hx of it.     ALLERGIES:  is allergic to codeine and novocain [procaine].  MEDICATIONS:  Current Outpatient Medications  Medication Sig Dispense Refill   acetaminophen (TYLENOL) 500 MG tablet Take 500 mg by mouth every 6 (six) hours as needed for headache.     albuterol (VENTOLIN HFA) 108 (90 Base) MCG/ACT inhaler Inhale 2 puffs into the lungs every 6 (six) hours as needed for wheezing or shortness of breath. 54 g 3   amLODipine (NORVASC) 5 MG tablet Take 1 tablet (5 mg total) by mouth daily. 90 tablet 3   clopidogrel (PLAVIX) 75 MG tablet Take 1 tablet (75 mg total) by mouth daily. 90 tablet 3   cyanocobalamin (VITAMIN B12) 1000 MCG tablet Take 1 tablet (1,000 mcg total) by mouth daily. 30 tablet 0   cyclobenzaprine (FLEXERIL) 10 MG tablet Take 10 mg by mouth 3 (three) times daily as needed for muscle spasms.     famotidine (PEPCID) 20 MG tablet Take by mouth. daily     furosemide (LASIX) 40 MG tablet Take  1 tablet (40 mg total) by mouth daily. 90 tablet 3   isosorbide mononitrate (IMDUR) 60 MG 24 hr tablet Take 1 tablet (60 mg total) by mouth 2 (two) times daily. 180 tablet 3   lisinopril (ZESTRIL) 10 MG tablet Take 1 tablet (10 mg total) by mouth daily. 90 tablet 1   metoprolol tartrate (LOPRESSOR) 25 MG tablet Take 0.5 tablets (12.5 mg total) by mouth 2 (two) times daily. 90 tablet 3   naloxone (NARCAN) nasal spray 4 mg/0.1 mL SMARTSIG:Both Nares     nitroGLYCERIN (NITROSTAT) 0.4 MG SL tablet Place 1 tablet (0.4 mg total) under the tongue every 5 (five) minutes as needed for  chest pain (X3 DOSES MAX). 75 tablet 2   oxyCODONE-acetaminophen (PERCOCET) 7.5-325 MG tablet Take 1-2 tablets by mouth every 4 (four) hours as needed for moderate pain (back pain).     polyethylene glycol (MIRALAX / GLYCOLAX) 17 g packet Take 17 g by mouth daily. 14 each 0   ranolazine (RANEXA) 500 MG 12 hr tablet Take 1 tablet (500 mg total) by mouth 2 (two) times daily. 180 tablet 3   rosuvastatin (CRESTOR) 20 MG tablet Take 1 tablet (20 mg total) by mouth daily. 90 tablet 3   Tiotropium Bromide-Olodaterol (STIOLTO RESPIMAT) 2.5-2.5 MCG/ACT AERS Inhale 2 puffs into the lungs daily. 4 g 11   VITAMIN D, ERGOCALCIFEROL, PO Take by mouth. weekly     No current facility-administered medications for this visit.    SURGICAL HISTORY:  Past Surgical History:  Procedure Laterality Date   CARDIAC CATHETERIZATION     CARDIAC DEFIBRILLATOR PLACEMENT  03/29/2006   [employer]. remote-yes.    COLONOSCOPY WITH PROPOFOL N/A 06/30/2023   Procedure: COLONOSCOPY WITH PROPOFOL;  Surgeon: Charlott Rakes, MD;  Location: [facility] END[facility]COPY;  Service: Gastroenterology;  Laterality: N/A;   CORONARY ARTERY BYPASS GRAFT     x4   CORONARY STENT INTERVENTION N/A 05/11/2022   Procedure: CORONARY STENT INTERVENTION;  Surgeon: Tonny Bollman, MD;  Location: [facility] INVASIVE CV LAB;  Service: Cardiovascular;  Laterality: N/A;   CORONARY/GRAFT ACUTE MI REVASCULARIZATION N/A 05/08/2022   Procedure: Coronary/Graft Acute MI Revascularization;  Surgeon: Lennette Bihari, MD;  Location: [facility] INVASIVE CV LAB;  Service: Cardiovascular;  Laterality: N/A;   IMPLANTABLE CARDIOVERTER DEFIBRILLATOR GENERATOR CHANGE N/A 03/03/2013   Procedure: IMPLANTABLE CARDIOVERTER DEFIBRILLATOR GENERATOR CHANGE;  Surgeon: [name] Salvia, MD;  Location: [facility] CATH LAB;  Service: Cardiovascular;  Laterality: N/A;   IR ANGIOGRAM VISCERAL SELECTIVE  05/06/2022   IR AORTAGRAM ABDOMINAL SERIALOGRAM  05/06/2022   IR EMBO ART  VEN HEMORR LYMPH EXTRAV  INC GUIDE  ROADMAPPING  05/06/2022   IR US GUIDE VASC ACCESS RIGHT  05/06/2022   LEFT HEART CATH AND CORONARY ANGIOGRAPHY N/A 05/08/2022   Procedure: LEFT HEART CATH AND CORONARY ANGIOGRAPHY;  Surgeon: Lennette Bihari, MD;  Location: [facility] INVASIVE CV LAB;  Service: Cardiovascular;  Laterality: N/A;   LEFT HEART CATH AND CORS/GRAFTS ANGIOGRAPHY N/A 05/11/2022   Procedure: LEFT HEART CATH AND CORS/GRAFTS ANGIOGRAPHY;  Surgeon: Tonny Bollman, MD;  Location: [facility] INVASIVE CV LAB;  Service: Cardiovascular;  Laterality: N/A;   POLYPECTOMY  06/30/2023   Procedure: POLYPECTOMY;  Surgeon: Charlott Rakes, MD;  Location: [facility];  Service: Gastroenterology;;   stent implant     x1. possibly x2.     REVIEW OF SYSTEMS:  Constitutional: positive for fatigue Eyes: negative Ears, nose, mouth, throat, and face: negative Respiratory: positive for cough and dyspnea on exertion Cardiovascular: negative Gastrointestinal: negative Genitourinary:negative  Integument/breast: positive for nipple discharge Hematologic/lymphatic: negative Musculoskeletal:negative Neurological: negative Behavioral/Psych: negative Endocrine: negative Allergic/Immunologic: negative   PHYSICAL EXAMINATION: General appearance: alert, cooperative, fatigued, and no distress Head: Normocephalic, without obvious abnormality, atraumatic Neck: no adenopathy, no JVD, supple, symmetrical, trachea midline, and thyroid not enlarged, symmetric, no tenderness/mass/nodules Lymph nodes: Cervical, supraclavicular, and axillary nodes normal. Resp: clear to auscultation bilaterally Back: symmetric, no curvature. ROM normal. No CVA tenderness. Cardio: regular rate and rhythm, S1, S2 normal, no murmur, click, rub or gallop GI: soft, non-tender; bowel sounds normal; no masses,  no organomegaly Extremities: extremities normal, atraumatic, no cyanosis or edema Neurologic: Alert and oriented X 3, normal strength and tone. Normal symmetric reflexes. Normal  coordination and gait  ECOG PERFORMANCE STATUS: 1 - Symptomatic but completely ambulatory  Blood pressure 112/63, pulse 78, temperature 98.1 F (36.7 C), temperature source Temporal, resp. rate 17, height 5\' 3"  (1.6 m), weight 145 lb (65.8 kg), SpO2 100%.  LABORATORY DATA: Lab Results  Component Value Date   WBC 4.6 08/04/2023   HGB 13.2 08/04/2023   HCT 38.8 08/04/2023   MCV 92.6 08/04/2023   PLT 196 08/04/2023      Chemistry      Component Value Date/Time   NA 140 08/04/2023 1449   NA 142 06/06/2022 1423   K 3.3 (L) 08/04/2023 1449   CL 104 08/04/2023 1449   CO2 28 08/04/2023 1449   BUN 15 08/04/2023 1449   BUN 12 06/06/2022 1423   CREATININE 1.32 (H) 08/04/2023 1449      Component Value Date/Time   CALCIUM 9.9 08/04/2023 1449   ALKPHOS 77 08/04/2023 1449   AST 15 08/04/2023 1449   ALT 9 08/04/2023 1449   BILITOT 0.5 08/04/2023 1449       RADIOGRAPHIC STUDIES: CT Chest W Contrast Result Date: 08/09/2023 CLINICAL DATA:  Non-small-cell lung cancer.  Restaging. EXAM: CT CHEST WITH CONTRAST TECHNIQUE: Multidetector CT imaging of the chest was performed during intravenous contrast administration. RADIATION DOSE REDUCTION: This exam was performed according to the departmental dose-optimization program which includes automated exposure control, adjustment of the mA and/or kV according to patient size and/or use of iterative reconstruction technique. CONTRAST:  75mL OMNIPAQUE IOHEXOL 300 MG/ML  SOLN COMPARISON:  05/06/2023 FINDINGS: Cardiovascular: The heart size is normal. No substantial pericardial effusion. Coronary artery calcification is evident. Moderate atherosclerotic calcification is noted in the wall of the thoracic aorta. Status post CABG. Left-sided pacer/AICD noted. Mediastinum/Nodes: No mediastinal lymphadenopathy. There is no hilar lymphadenopathy. The esophagus has normal imaging features. There is no axillary lymphadenopathy. Lungs/Pleura: Focal irregular opacity  in the superior segment left lower lobe is similar to prior. Previously measured 3.6 x 1.9 cm, now measuring 3.9 x 2.0 cm on image 57/6. Right lower lobe irregular pulmonary nodule measured previously at 2.0 x 1.3 cm is now 2.4 x 2.0 cm. 8 mm right lower lobe paraspinal subpleural nodule on 123/6 is similar to prior. Centrilobular emphsyema noted.  No pleural effusion. Upper Abdomen: Scattered tiny hypodensities in the liver parenchyma are too small to characterize but are statistically most likely benign and not appreciably changed in the interval. No followup imaging is recommended. No adrenal nodule or mass. 8 mm hypervascular lesion identified in the dome of the lateral segment left liver, similar to prior and also comparing back to a study from 01/03/2023. Musculoskeletal: No worrisome lytic or sclerotic osseous abnormality. IMPRESSION: 1. Interval increase in size of the right lower lobe irregular pulmonary nodule, now measuring 2.4 x 2.0  cm. Primary bronchogenic neoplasm remains a concern. 2. Focal irregular opacity in the superior segment left lower lobe is similar to prior, potentially post treatment change. 3. 8 mm hypervascular lesion in the dome of the lateral segment left liver, similar to prior and also comparing back to a study from 01/03/2023. While likely benign, close attention on follow-up recommended. 4. Emphysema (ICD10-J43.9) and Aortic Atherosclerosis (ICD10-170.0) . Electronically Signed   By: Donnal Fusi M.D.   On: 08/09/2023 09:18   MYOCARDIAL PERFUSION IMAGING Result Date: 08/05/2023   Findings are consistent with a very small area of basal inferolateral wall ischemia. The study is low risk.   No ST deviation was noted.   LV perfusion is abnormal. Defect 1: There is a small defect with mild reduction in uptake present in the basal inferolateral location(s) that is reversible. There is normal wall motion in the defect area. Consistent with ischemia. Defect 2: There is a small defect  with mild reduction in uptake present in the basal inferior location(s) that is fixed.   Left ventricular function is normal. Nuclear stress EF: 63%. The left ventricular ejection fraction is normal (55-65%). End diastolic cavity size is normal.   CUP PACEART REMOTE DEVICE CHECK Result Date: 07/24/2023 Scheduled remote reviewed. Normal device function.  Presenting rhythm: VS. RV Intrinsic Amplitude 2.7 mV, Trends show fluctuations between 2.6 mV and 22.4 mV during monitoring period. Next remote 91 days. [facility], [facility]   ASSESSMENT AND PLAN: This is a very pleasant 78 years old African-American female with Stage IB (T1b, N0, M0) non-small cell lung cancer, adenocarcinoma presented with left lower lobe lung nodule diagnosed in September 2022.  She is status post SBRT under the care of Dr. Eloise Hake. Last treatment on 02/15/21.  Unfortunately her last CT scan showed bilateral pulmonary opacities suspicious for low-grade adenocarcinoma but inflammatory process could not be completely ruled out especially with her aspiration issues. Previous CT scan of the chest in October 2023 showed stable disease with stable bilateral pulmonary nodules that did not resolve in the interval but has been stable since the December 06, 2021 scan. The patient underwent SBRT to the right lower lobe lung nodule under the care of Dr. Eloise Hake completed in October 2024. She is currently on observation with some tightness in the left side of the chest as well as left breast nipple discharge. She had repeat CT scan of the chest that showed increase in the size of the right middle lobe lung mass.    Right lower lobe lung nodule The right lower lobe lung nodule has increased in size from 2.0 x 1.3 cm to 2.4 x 2.0 cm since the last CT scan, raising concern for potential malignancy, possibly a recurrence or progression of lung cancer. Further investigation is warranted to determine the nature of the nodule. - Order PET scan to evaluate the right  lower lobe lung nodule - Schedule follow-up appointment in three weeks to discuss PET scan results  Left lower lobe lung nodule post-SBRT The left lower lobe lung nodule remains unchanged post-SBRT with no significant changes on the recent CT scan. Continued observation is appropriate. - Continue observation of the left lower lobe lung nodule  Breast discharge She reports clear breast discharge, atypical for her age. The last mammogram was performed a year and a half ago. The discharge may indicate underlying breast pathology requiring further evaluation. A mammogram is necessary as CT scan is ineffective for breast cancer screening. - Advise her to schedule a mammogram at  the breast center as soon as possible   The patient was advised to call immediately if she has any other concerning symptoms in the interval. The patient voices understanding of current disease status and treatment options and is in agreement with the current care plan.  All questions were answered. The patient knows to call the clinic with any problems, questions or concerns. We can certainly see the patient much sooner if necessary. The total time spent in the appointment was 30 minutes.  Disclaimer: This note was dictated with voice recognition software. Similar sounding words can inadvertently be transcribed and may not be corrected upon review.

## 2023-08-12 ENCOUNTER — Telehealth: Payer: Self-pay | Admitting: Internal Medicine

## 2023-08-12 NOTE — Telephone Encounter (Signed)
 Scheduled appointments for scan review. The patient is aware of the appointment details and will be mailed an appointment reminder.

## 2023-08-18 ENCOUNTER — Encounter (HOSPITAL_COMMUNITY)
Admission: RE | Admit: 2023-08-18 | Discharge: 2023-08-18 | Disposition: A | Discharge status: Home / Self Care | Source: Ambulatory Visit | Attending: Internal Medicine | Admitting: Internal Medicine

## 2023-08-18 DIAGNOSIS — C3490 Malignant neoplasm of unspecified part of unspecified bronchus or lung: Secondary | ICD-10-CM | POA: Insufficient documentation

## 2023-08-18 LAB — GLUCOSE, CAPILLARY: Glucose-Capillary: 124 mg/dL — ABNORMAL HIGH (ref 70–99)

## 2023-08-18 MED ORDER — FLUDEOXYGLUCOSE F - 18 (FDG) INJECTION
7.2000 | Freq: Once | INTRAVENOUS | Status: AC
  Administered 2023-08-18: 7.2 via INTRAVENOUS

## 2023-08-19 ENCOUNTER — Other Ambulatory Visit: Payer: Self-pay | Admitting: Family Medicine

## 2023-08-19 DIAGNOSIS — Z1231 Encounter for screening mammogram for malignant neoplasm of breast: Secondary | ICD-10-CM

## 2023-08-29 ENCOUNTER — Other Ambulatory Visit: Payer: Self-pay

## 2023-08-29 DIAGNOSIS — C3490 Malignant neoplasm of unspecified part of unspecified bronchus or lung: Secondary | ICD-10-CM

## 2023-09-01 ENCOUNTER — Inpatient Hospital Stay: Attending: Internal Medicine

## 2023-09-01 ENCOUNTER — Inpatient Hospital Stay (HOSPITAL_BASED_OUTPATIENT_CLINIC_OR_DEPARTMENT_OTHER): Admitting: Internal Medicine

## 2023-09-01 VITALS — BP 135/56 | HR 64 | Temp 97.6°F | Resp 17 | Ht 63.0 in | Wt 143.6 lb

## 2023-09-01 DIAGNOSIS — C3490 Malignant neoplasm of unspecified part of unspecified bronchus or lung: Secondary | ICD-10-CM

## 2023-09-01 DIAGNOSIS — Z923 Personal history of irradiation: Secondary | ICD-10-CM | POA: Insufficient documentation

## 2023-09-01 DIAGNOSIS — Z85118 Personal history of other malignant neoplasm of bronchus and lung: Secondary | ICD-10-CM | POA: Insufficient documentation

## 2023-09-01 LAB — CBC WITH DIFFERENTIAL (CANCER CENTER ONLY)
Abs Immature Granulocytes: 0.01 10*3/uL (ref 0.00–0.07)
Basophils Absolute: 0 10*3/uL (ref 0.0–0.1)
Basophils Relative: 1 %
Eosinophils Absolute: 0.1 10*3/uL (ref 0.0–0.5)
Eosinophils Relative: 3 %
HCT: 36.2 % (ref 36.0–46.0)
Hemoglobin: 12.3 g/dL (ref 12.0–15.0)
Immature Granulocytes: 0 %
Lymphocytes Relative: 28 %
Lymphs Abs: 1.1 10*3/uL (ref 0.7–4.0)
MCH: 31.5 pg (ref 26.0–34.0)
MCHC: 34 g/dL (ref 30.0–36.0)
MCV: 92.8 fL (ref 80.0–100.0)
Monocytes Absolute: 0.3 10*3/uL (ref 0.1–1.0)
Monocytes Relative: 7 %
Neutro Abs: 2.4 10*3/uL (ref 1.7–7.7)
Neutrophils Relative %: 61 %
Platelet Count: 188 10*3/uL (ref 150–400)
RBC: 3.9 MIL/uL (ref 3.87–5.11)
RDW: 12.4 % (ref 11.5–15.5)
WBC Count: 3.9 10*3/uL — ABNORMAL LOW (ref 4.0–10.5)
nRBC: 0 % (ref 0.0–0.2)

## 2023-09-01 LAB — CMP (CANCER CENTER ONLY)
ALT: 9 U/L (ref 0–44)
AST: 12 U/L — ABNORMAL LOW (ref 15–41)
Albumin: 4.1 g/dL (ref 3.5–5.0)
Alkaline Phosphatase: 65 U/L (ref 38–126)
Anion gap: 5 (ref 5–15)
BUN: 14 mg/dL (ref 8–23)
CO2: 30 mmol/L (ref 22–32)
Calcium: 9.3 mg/dL (ref 8.9–10.3)
Chloride: 106 mmol/L (ref 98–111)
Creatinine: 1.07 mg/dL — ABNORMAL HIGH (ref 0.44–1.00)
GFR, Estimated: 53 mL/min — ABNORMAL LOW (ref >60)
Glucose, Bld: 133 mg/dL — ABNORMAL HIGH (ref 70–99)
Potassium: 3.9 mmol/L (ref 3.5–5.1)
Sodium: 141 mmol/L (ref 135–145)
Total Bilirubin: 0.6 mg/dL (ref 0.0–1.2)
Total Protein: 6.6 g/dL (ref 6.5–8.1)

## 2023-09-01 NOTE — Progress Notes (Signed)
 [facility] [phone]   [phone]  OFFICE PROGRESS NOTE  Center, [facility] [address] [place] [address]  DIAGNOSIS: Recurrent non-small cell lung cancer initially diagnosed as stage IB (T1b, N0, M0) non-small cell lung cancer, adenocarcinoma presented with left lower lobe lung nodule diagnosed in September 2022.  She also has right lower lobe pulmonary nodule diagnosed in October 2024.   PRIOR THERAPY:  1) SBRT under the care of Dr. Eloise Hake. Last treatment on 02/15/21.   2) SBRT to the right lower lobe lung nodule under the care of Dr. Eloise Hake in October 2024.  CURRENT THERAPY: Observation   INTERVAL HISTORY: Karen [name] 78 y.o. female returns to the clinic today for follow-up visit.  Discussed the use of AI scribe software for clinical note transcription with the patient, who gave verbal consent to proceed.  History of Present Illness   Karen [name] is a 78 year old female with recurrent non-small cell lung cancer who presents for evaluation and discussion of her PET scan results.  Initially diagnosed with stage 1B adenocarcinoma in September 2022, she presented with a left lower lobe lung nodule and underwent stereotactic body radiation therapy (SBRT). In October 2024, she received another SBRT for a right lower lobe lung nodule. She is currently under observation.  A recent CT scan showed an increase in the size of the right lower lobe irregular nodule, leading to a PET scan. The PET scan indicated activity in the right lower lobe, suspected to be related to previous radiation treatment.  She experiences increased shortness of breath and has had a recent weight loss from 148 to 143 pounds. No hemoptysis, but she mentions having a cold.  Her current medications include lisinopril , metoprolol , and amlodipine  for hypertension, a weekly dose of Fosamax for osteoporosis, and vitamin D supplements.       MEDICAL  HISTORY: Past Medical History:  Diagnosis Date   Acute myocardial infarction, unspecified site, episode of care unspecified    Adenocarcinoma, lung (HCC) 02/16/2021   Allergic rhinitis, cause unspecified    CAD (coronary artery disease)    GERD (gastroesophageal reflux disease)    Headache(784.0)    History of radiation therapy    left lung SBRT 02/08/2021, 02/13/2021, 02/15/2021  Dr Retta Caster   History of radiation therapy    RIght lung- 02/04/23-02/12/23-Dr. Retta Caster   HTN (hypertension)    Other and unspecified hyperlipidemia    Other diseases of lung, not elsewhere classified    Postsurgical aortocoronary bypass status    hx of it.     ALLERGIES:  is allergic to codeine and novocain [procaine].  MEDICATIONS:  Current Outpatient Medications  Medication Sig Dispense Refill   acetaminophen  (TYLENOL ) 500 MG tablet Take 500 mg by mouth every 6 (six) hours as needed for headache.     albuterol  (VENTOLIN  HFA) 108 (90 Base) MCG/ACT inhaler Inhale 2 puffs into the lungs every 6 (six) hours as needed for wheezing or shortness of breath. 54 g 3   amLODipine  (NORVASC ) 5 MG tablet Take 1 tablet (5 mg total) by mouth daily. 90 tablet 3   clopidogrel  (PLAVIX ) 75 MG tablet Take 1 tablet (75 mg total) by mouth daily. 90 tablet 3   cyanocobalamin  (VITAMIN B12) 1000 MCG tablet Take 1 tablet (1,000 mcg total) by mouth daily. 30 tablet 0   cyclobenzaprine (FLEXERIL) 10 MG tablet Take 10 mg by mouth 3 (three) times daily as needed for muscle  spasms.     famotidine  (PEPCID ) 20 MG tablet Take by mouth. daily     furosemide  (LASIX ) 40 MG tablet Take 1 tablet (40 mg total) by mouth daily. 90 tablet 3   isosorbide  mononitrate (IMDUR ) 60 MG 24 hr tablet Take 1 tablet (60 mg total) by mouth 2 (two) times daily. 180 tablet 3   lisinopril  (ZESTRIL ) 10 MG tablet Take 1 tablet (10 mg total) by mouth daily. 90 tablet 1   metoprolol  tartrate (LOPRESSOR ) 25 MG tablet Take 0.5 tablets (12.5 mg total) by  mouth 2 (two) times daily. 90 tablet 3   naloxone (NARCAN) nasal spray 4 mg/0.1 mL SMARTSIG:Both Nares     nitroGLYCERIN  (NITROSTAT ) 0.4 MG SL tablet Place 1 tablet (0.4 mg total) under the tongue every 5 (five) minutes as needed for chest pain (X3 DOSES MAX). 75 tablet 2   oxyCODONE -acetaminophen  (PERCOCET) 7.5-325 MG tablet Take 1-2 tablets by mouth every 4 (four) hours as needed for moderate pain (back pain).     polyethylene glycol (MIRALAX  / GLYCOLAX ) 17 g packet Take 17 g by mouth daily. 14 each 0   ranolazine  (RANEXA ) 500 MG 12 hr tablet Take 1 tablet (500 mg total) by mouth 2 (two) times daily. 180 tablet 3   rosuvastatin  (CRESTOR ) 20 MG tablet Take 1 tablet (20 mg total) by mouth daily. 90 tablet 3   Tiotropium Bromide -Olodaterol (STIOLTO RESPIMAT ) 2.5-2.5 MCG/ACT AERS Inhale 2 puffs into the lungs daily. 4 g 11   VITAMIN D, ERGOCALCIFEROL, PO Take by mouth. weekly     No current facility-administered medications for this visit.    SURGICAL HISTORY:  Past Surgical History:  Procedure Laterality Date   CARDIAC CATHETERIZATION     CARDIAC DEFIBRILLATOR PLACEMENT  03/29/2006   [employer]. remote-yes.    COLONOSCOPY WITH PROPOFOL  N/A 06/30/2023   Procedure: COLONOSCOPY WITH PROPOFOL ;  Surgeon: Baldo Bonds, MD;  Location: [facility];  Service: Gastroenterology;  Laterality: N/A;   CORONARY ARTERY BYPASS GRAFT     x4   CORONARY STENT INTERVENTION N/A 05/11/2022   Procedure: CORONARY STENT INTERVENTION;  Surgeon: Arnoldo Lapping, MD;  Location: [facility] INVASIVE CV LAB;  Service: Cardiovascular;  Laterality: N/A;   CORONARY/GRAFT ACUTE MI REVASCULARIZATION N/A 05/08/2022   Procedure: Coronary/Graft Acute MI Revascularization;  Surgeon: Millicent Ally, MD;  Location: [facility] INVASIVE CV LAB;  Service: Cardiovascular;  Laterality: N/A;   IMPLANTABLE CARDIOVERTER DEFIBRILLATOR GENERATOR CHANGE N/A 03/03/2013   Procedure: IMPLANTABLE CARDIOVERTER DEFIBRILLATOR GENERATOR CHANGE;  Surgeon: Verona Goodwill, MD;  Location: [facility] CATH LAB;  Service: Cardiovascular;  Laterality: N/A;   IR ANGIOGRAM VISCERAL SELECTIVE  05/06/2022   IR AORTAGRAM ABDOMINAL SERIALOGRAM  05/06/2022   IR EMBO ART  VEN HEMORR LYMPH EXTRAV  INC GUIDE ROADMAPPING  05/06/2022   IR US  GUIDE VASC ACCESS RIGHT  05/06/2022   LEFT HEART CATH AND CORONARY ANGIOGRAPHY N/A 05/08/2022   Procedure: LEFT HEART CATH AND CORONARY ANGIOGRAPHY;  Surgeon: Millicent Ally, MD;  Location: [facility] INVASIVE CV LAB;  Service: Cardiovascular;  Laterality: N/A;   LEFT HEART CATH AND CORS/GRAFTS ANGIOGRAPHY N/A 05/11/2022   Procedure: LEFT HEART CATH AND CORS/GRAFTS ANGIOGRAPHY;  Surgeon: Arnoldo Lapping, MD;  Location: [facility] INVASIVE CV LAB;  Service: Cardiovascular;  Laterality: N/A;   POLYPECTOMY  06/30/2023   Procedure: POLYPECTOMY;  Surgeon: Baldo Bonds, MD;  Location: [facility];  Service: Gastroenterology;;   stent implant     x1. possibly x2.     REVIEW OF SYSTEMS:  Constitutional: positive  for fatigue Eyes: negative Ears, nose, mouth, throat, and face: negative Respiratory: positive for cough and dyspnea on exertion Cardiovascular: negative Gastrointestinal: negative Genitourinary:negative Integument/breast: negative Hematologic/lymphatic: negative Musculoskeletal:negative Neurological: negative Behavioral/Psych: negative Endocrine: negative Allergic/Immunologic: negative   PHYSICAL EXAMINATION: General appearance: alert, cooperative, fatigued, and no distress Head: Normocephalic, without obvious abnormality, atraumatic Neck: no adenopathy, no JVD, supple, symmetrical, trachea midline, and thyroid not enlarged, symmetric, no tenderness/mass/nodules Lymph nodes: Cervical, supraclavicular, and axillary nodes normal. Resp: clear to auscultation bilaterally Back: symmetric, no curvature. ROM normal. No CVA tenderness. Cardio: regular rate and rhythm, S1, S2 normal, no murmur, click, rub or gallop GI: soft, non-tender; bowel  sounds normal; no masses,  no organomegaly Extremities: extremities normal, atraumatic, no cyanosis or edema Neurologic: Alert and oriented X 3, normal strength and tone. Normal symmetric reflexes. Normal coordination and gait  ECOG PERFORMANCE STATUS: 1 - Symptomatic but completely ambulatory  Blood pressure (!) 135/56, pulse 64, temperature 97.6 F (36.4 C), temperature source Temporal, resp. rate 17, height 5\' 3"  (1.6 m), weight 143 lb 9.6 oz (65.1 kg), SpO2 100%.  LABORATORY DATA: Lab Results  Component Value Date   WBC 3.9 (L) 09/01/2023   HGB 12.3 09/01/2023   HCT 36.2 09/01/2023   MCV 92.8 09/01/2023   PLT 188 09/01/2023      Chemistry      Component Value Date/Time   NA 140 08/04/2023 1449   NA 142 06/06/2022 1423   K 3.3 (L) 08/04/2023 1449   CL 104 08/04/2023 1449   CO2 28 08/04/2023 1449   BUN 15 08/04/2023 1449   BUN 12 06/06/2022 1423   CREATININE 1.32 (H) 08/04/2023 1449      Component Value Date/Time   CALCIUM  9.9 08/04/2023 1449   ALKPHOS 77 08/04/2023 1449   AST 15 08/04/2023 1449   ALT 9 08/04/2023 1449   BILITOT 0.5 08/04/2023 1449       RADIOGRAPHIC STUDIES: NM PET Image Restage (PS) Skull Base to Thigh (F-18 FDG) Result Date: 08/22/2023 CLINICAL DATA:  Subsequent treatment strategy for non-small cell lung cancer. EXAM: NUCLEAR MEDICINE PET SKULL BASE TO THIGH TECHNIQUE: 7.2 mCi F-18 FDG was injected intravenously. Full-ring PET imaging was performed from the skull base to thigh after the radiotracer. CT data was obtained and used for attenuation correction and anatomic localization. Fasting blood glucose: 124 mg/dl COMPARISON:  52/84/1324 FINDINGS: Mediastinal blood pool activity: SUV max 2.6 Liver activity: SUV max NA NECK: Symmetric activity along the arytenoid region, likely physiologic. Incidental CT findings: Bilateral carotid atherosclerosis. CHEST: The focal consolidation in the superior segment left lower lobe has a maximum SUV of 3.0,  previously 3.9. Most recent SBRT of this nodule: October 2022. The somewhat ill-defined 2.9 by 2.0 cm opacity/nodule in the superior segment right upper lobe has a maximum SUV 5.5, previously smaller and with maximum SUV 3.0 on 02/22/2022. Most recent SBRT of this nodule: October 2024. The small subpleural nodule posteromedially in the right lower lobe shown on CT 08/04/2023 is less well seen today, with no associated measurable metabolic activity. Incidental CT findings: Coronary, aortic arch, and branch vessel atherosclerotic vascular disease. AICD noted. Prior CABG. ABDOMEN/PELVIS: No significant abnormal hypermetabolic activity in this region. Incidental CT findings: Atherosclerosis is present, including aortoiliac atherosclerotic disease. Cholecystectomy. Colonic diverticulosis. SKELETON: No significant abnormal hypermetabolic activity in this region. Incidental CT findings: Grade 1 anterolisthesis at L4-5. Degenerative disc disease at L5-S1. Nondisplaced healing fracture of the left posterior sixth rib adjacent to the region of prior radiation  therapy. IMPRESSION: 1. The somewhat ill-defined 2.9 by 2.0 cm opacity/nodule in the superior segment right upper lobe has a maximum SUV 5.5. SBRT was performed to this region about 6 months ago, and the current density is larger than the original tumor 1 month prior to SBRT. Accordingly this could well be mostly due to radiation pneumonitis, although residual viable tumor in this region cannot be excluded. PET-CT has difficulty differentiating radiation pneumonitis from tumor in this timeframe. Possibilities include surveillance CT or attempted re-biopsy of the region. 2. The small subpleural nodule posteromedially in the right lower lobe shown on CT 08/04/2023 is less well seen today, with no associated measurable metabolic activity. 3. Low-grade activity of the superior segment left lower lobe favoring local fibrosis and treatment related findings. Nondisplaced  healing fracture the left posterior sixth rib adjacent to this region of radiation fibrosis. 4. Coronary, aortic arch, and branch vessel atherosclerotic vascular disease. Aortic Atherosclerosis (ICD10-I70.0). 5. Colonic diverticulosis. 6. Grade 1 anterolisthesis at L4-5. Degenerative disc disease at L5-S1. Electronically Signed   By: Freida Jes M.D.   On: 08/22/2023 14:28   CT Chest W Contrast Result Date: 08/09/2023 CLINICAL DATA:  Non-small-cell lung cancer.  Restaging. EXAM: CT CHEST WITH CONTRAST TECHNIQUE: Multidetector CT imaging of the chest was performed during intravenous contrast administration. RADIATION DOSE REDUCTION: This exam was performed according to the departmental dose-optimization program which includes automated exposure control, adjustment of the mA and/or kV according to patient size and/or use of iterative reconstruction technique. CONTRAST:  75mL OMNIPAQUE  IOHEXOL  300 MG/ML  SOLN COMPARISON:  05/06/2023 FINDINGS: Cardiovascular: The heart size is normal. No substantial pericardial effusion. Coronary artery calcification is evident. Moderate atherosclerotic calcification is noted in the wall of the thoracic aorta. Status post CABG. Left-sided pacer/AICD noted. Mediastinum/Nodes: No mediastinal lymphadenopathy. There is no hilar lymphadenopathy. The esophagus has normal imaging features. There is no axillary lymphadenopathy. Lungs/Pleura: Focal irregular opacity in the superior segment left lower lobe is similar to prior. Previously measured 3.6 x 1.9 cm, now measuring 3.9 x 2.0 cm on image 57/6. Right lower lobe irregular pulmonary nodule measured previously at 2.0 x 1.3 cm is now 2.4 x 2.0 cm. 8 mm right lower lobe paraspinal subpleural nodule on 123/6 is similar to prior. Centrilobular emphsyema noted.  No pleural effusion. Upper Abdomen: Scattered tiny hypodensities in the liver parenchyma are too small to characterize but are statistically most likely benign and not  appreciably changed in the interval. No followup imaging is recommended. No adrenal nodule or mass. 8 mm hypervascular lesion identified in the dome of the lateral segment left liver, similar to prior and also comparing back to a study from 01/03/2023. Musculoskeletal: No worrisome lytic or sclerotic osseous abnormality. IMPRESSION: 1. Interval increase in size of the right lower lobe irregular pulmonary nodule, now measuring 2.4 x 2.0 cm. Primary bronchogenic neoplasm remains a concern. 2. Focal irregular opacity in the superior segment left lower lobe is similar to prior, potentially post treatment change. 3. 8 mm hypervascular lesion in the dome of the lateral segment left liver, similar to prior and also comparing back to a study from 01/03/2023. While likely benign, close attention on follow-up recommended. 4. Emphysema (ICD10-J43.9) and Aortic Atherosclerosis (ICD10-170.0) . Electronically Signed   By: Donnal Fusi M.D.   On: 08/09/2023 09:18   MYOCARDIAL PERFUSION IMAGING Result Date: 08/05/2023   Findings are consistent with a very small area of basal inferolateral wall ischemia. The study is low risk.   No ST deviation  was noted.   LV perfusion is abnormal. Defect 1: There is a small defect with mild reduction in uptake present in the basal inferolateral location(s) that is reversible. There is normal wall motion in the defect area. Consistent with ischemia. Defect 2: There is a small defect with mild reduction in uptake present in the basal inferior location(s) that is fixed.   Left ventricular function is normal. Nuclear stress EF: 63%. The left ventricular ejection fraction is normal (55-65%). End diastolic cavity size is normal.    ASSESSMENT AND PLAN: This is a very pleasant 78 years old African-American female with Stage IB (T1b, N0, M0) non-small cell lung cancer, adenocarcinoma presented with left lower lobe lung nodule diagnosed in September 2022.  She is status post SBRT under the care of  Dr. Eloise Hake. Last treatment on 02/15/21.  Unfortunately her last CT scan showed bilateral pulmonary opacities suspicious for low-grade adenocarcinoma but inflammatory process could not be completely ruled out especially with her aspiration issues. Previous CT scan of the chest in October 2023 showed stable disease with stable bilateral pulmonary nodules that did not resolve in the interval but has been stable since the December 06, 2021 scan. The patient underwent SBRT to the right lower lobe lung nodule under the care of Dr. Eloise Hake completed in October 2024. She had repeat CT scan of the chest that showed increase in the size of the right middle lobe lung mass. She had a PET scan performed recently.  I personally and independently reviewed the PET scan and discussed the result with the patient today. Assessment and Plan    Recurrent lung cancer Recurrent non-small cell lung cancer, initially diagnosed as stage IB adenocarcinoma in September 2022, with a left lower lobe lung nodule. Status post stereotactic body radiation therapy (SBRT) to the left and right lower lobe nodules. Recent PET scan shows activity in the right lower lobe nodule, suspicious for radiation pneumonitis rather than active malignancy. She reports increased shortness of breath and weight loss, but no hemoptysis. Monitoring the nodule with a follow-up CT scan in three months is planned, considering the possibility of radiation-induced changes rather than progression of malignancy. - Repeat CT scan of the chest in 3 months to monitor the right lower lobe nodule. - Instruct her to report any new symptoms such as hemoptysis or increased shortness of breath.  Hypertension Hypertension managed with lisinopril , metoprolol , and amlodipine . - Continue current antihypertensive medications: lisinopril , metoprolol , and amlodipine .  Osteoporosis Osteoporosis diagnosed via bone density scan. Currently managed with vitamin D and a weekly  bisphosphonate, likely Fosamax.   She was advised to call immediately if she has any other concerning symptoms in the interval. The patient voices understanding of current disease status and treatment options and is in agreement with the current care plan.  All questions were answered. The patient knows to call the clinic with any problems, questions or concerns. We can certainly see the patient much sooner if necessary. The total time spent in the appointment was 30 minutes.  Disclaimer: This note was dictated with voice recognition software. Similar sounding words can inadvertently be transcribed and may not be corrected upon review.

## 2023-09-10 NOTE — Progress Notes (Signed)
 Remote ICD transmission.

## 2023-09-26 ENCOUNTER — Telehealth: Payer: Self-pay | Admitting: Cardiovascular Disease

## 2023-09-26 MED ORDER — CLOPIDOGREL BISULFATE 75 MG PO TABS: 75.0000 mg | ORAL_TABLET | Freq: Every day | ORAL | 3 refills | Status: AC

## 2023-09-26 NOTE — Telephone Encounter (Signed)
 Pt's medication was sent to pt's pharmacy as requested. Confirmation received.

## 2023-09-26 NOTE — Telephone Encounter (Signed)
*  STAT* If patient is at the pharmacy, call can be transferred to refill team.   1. Which medications need to be refilled? (please list name of each medication and dose if known)  clopidogrel  (PLAVIX ) 75 MG tablet  2. Which pharmacy/location (including street and city if local pharmacy) is medication to be sent to? [id number], [place] - [address]  3. Do they need a 30 day or 90 day supply?   90 day supply

## 2023-10-27 ENCOUNTER — Ambulatory Visit: Payer: 59

## 2023-10-27 DIAGNOSIS — I4901 Ventricular fibrillation: Secondary | ICD-10-CM

## 2023-10-27 LAB — CUP PACEART REMOTE DEVICE CHECK
Battery Remaining Longevity: 36 mo
Battery Remaining Percentage: 42 %
Brady Statistic RV Percent Paced: 0 %
Date Time Interrogation Session: 20250630044100
HighPow Impedance: 70 Ohm
Implantable Lead Connection Status: 753985
Implantable Lead Implant Date: 20071205
Implantable Lead Location: 753860
Implantable Lead Model: 137
Implantable Lead Serial Number: 102363
Implantable Pulse Generator Implant Date: 20141105
Lead Channel Impedance Value: 464 Ohm
Lead Channel Pacing Threshold Amplitude: 0.5 V
Lead Channel Pacing Threshold Pulse Width: 0.5 ms
Lead Channel Setting Pacing Amplitude: 2 V
Lead Channel Setting Pacing Pulse Width: 0.5 ms
Lead Channel Setting Sensing Sensitivity: 0.6 mV
Pulse Gen Serial Number: 126146

## 2023-10-29 ENCOUNTER — Ambulatory Visit: Payer: Self-pay | Admitting: Cardiology

## 2023-11-19 ENCOUNTER — Telehealth: Payer: Self-pay | Admitting: Internal Medicine

## 2023-11-19 ENCOUNTER — Telehealth: Payer: Self-pay

## 2023-11-19 NOTE — Telephone Encounter (Signed)
 Spoke with patient regarding rescheduled appointments. Patient's lab appointment on Friday, 11/21/23 was rescheduled to 1:00 PM to be closer to her CT scan appointment. Patient voiced understanding.

## 2023-11-19 NOTE — Telephone Encounter (Signed)
 Left the patient a voicemail with the rescheduled appointment details per provider on call.

## 2023-11-21 ENCOUNTER — Ambulatory Visit (HOSPITAL_COMMUNITY)
Admission: RE | Admit: 2023-11-21 | Discharge: 2023-11-21 | Disposition: A | Discharge status: Home / Self Care | Source: Ambulatory Visit | Attending: Internal Medicine | Admitting: Internal Medicine

## 2023-11-21 ENCOUNTER — Inpatient Hospital Stay: Attending: Internal Medicine

## 2023-11-21 ENCOUNTER — Other Ambulatory Visit

## 2023-11-21 DIAGNOSIS — R079 Chest pain, unspecified: Secondary | ICD-10-CM | POA: Diagnosis not present

## 2023-11-21 DIAGNOSIS — R634 Abnormal weight loss: Secondary | ICD-10-CM | POA: Insufficient documentation

## 2023-11-21 DIAGNOSIS — C3490 Malignant neoplasm of unspecified part of unspecified bronchus or lung: Secondary | ICD-10-CM

## 2023-11-21 DIAGNOSIS — C3431 Malignant neoplasm of lower lobe, right bronchus or lung: Secondary | ICD-10-CM | POA: Insufficient documentation

## 2023-11-21 DIAGNOSIS — R519 Headache, unspecified: Secondary | ICD-10-CM | POA: Diagnosis not present

## 2023-11-21 DIAGNOSIS — C3412 Malignant neoplasm of upper lobe, left bronchus or lung: Secondary | ICD-10-CM | POA: Insufficient documentation

## 2023-11-21 DIAGNOSIS — R0602 Shortness of breath: Secondary | ICD-10-CM | POA: Insufficient documentation

## 2023-11-21 LAB — CBC WITH DIFFERENTIAL (CANCER CENTER ONLY)
Abs Immature Granulocytes: 0 K/uL (ref 0.00–0.07)
Basophils Absolute: 0 K/uL (ref 0.0–0.1)
Basophils Relative: 1 %
Eosinophils Absolute: 0.1 K/uL (ref 0.0–0.5)
Eosinophils Relative: 1 %
HCT: 35.4 % — ABNORMAL LOW (ref 36.0–46.0)
Hemoglobin: 12.1 g/dL (ref 12.0–15.0)
Immature Granulocytes: 0 %
Lymphocytes Relative: 48 %
Lymphs Abs: 1.8 K/uL (ref 0.7–4.0)
MCH: 31.7 pg (ref 26.0–34.0)
MCHC: 34.2 g/dL (ref 30.0–36.0)
MCV: 92.7 fL (ref 80.0–100.0)
Monocytes Absolute: 0.3 K/uL (ref 0.1–1.0)
Monocytes Relative: 7 %
Neutro Abs: 1.6 K/uL — ABNORMAL LOW (ref 1.7–7.7)
Neutrophils Relative %: 43 %
Platelet Count: 179 K/uL (ref 150–400)
RBC: 3.82 MIL/uL — ABNORMAL LOW (ref 3.87–5.11)
RDW: 12.4 % (ref 11.5–15.5)
WBC Count: 3.7 K/uL — ABNORMAL LOW (ref 4.0–10.5)
nRBC: 0 % (ref 0.0–0.2)

## 2023-11-21 LAB — CMP (CANCER CENTER ONLY)
ALT: 11 U/L (ref 0–44)
AST: 14 U/L — ABNORMAL LOW (ref 15–41)
Albumin: 4.1 g/dL (ref 3.5–5.0)
Alkaline Phosphatase: 63 U/L (ref 38–126)
Anion gap: 8 (ref 5–15)
BUN: 12 mg/dL (ref 8–23)
CO2: 26 mmol/L (ref 22–32)
Calcium: 9 mg/dL (ref 8.9–10.3)
Chloride: 108 mmol/L (ref 98–111)
Creatinine: 1.15 mg/dL — ABNORMAL HIGH (ref 0.44–1.00)
GFR, Estimated: 49 mL/min — ABNORMAL LOW (ref >60)
Glucose, Bld: 109 mg/dL — ABNORMAL HIGH (ref 70–99)
Potassium: 3.4 mmol/L — ABNORMAL LOW (ref 3.5–5.1)
Sodium: 142 mmol/L (ref 135–145)
Total Bilirubin: 0.5 mg/dL (ref 0.0–1.2)
Total Protein: 6.7 g/dL (ref 6.5–8.1)

## 2023-11-21 MED ORDER — IOHEXOL 300 MG/ML  SOLN
60.0000 mL | Freq: Once | INTRAMUSCULAR | Status: AC | PRN
  Administered 2023-11-21: 60 mL via INTRAVENOUS

## 2023-11-24 ENCOUNTER — Ambulatory Visit: Admitting: Internal Medicine

## 2023-11-26 ENCOUNTER — Other Ambulatory Visit: Payer: Self-pay | Admitting: Nurse Practitioner

## 2023-11-27 ENCOUNTER — Inpatient Hospital Stay: Admitting: Internal Medicine

## 2023-11-27 ENCOUNTER — Telehealth: Payer: Self-pay | Admitting: Internal Medicine

## 2023-11-27 ENCOUNTER — Ambulatory Visit: Admitting: Internal Medicine

## 2023-11-27 VITALS — BP 132/82 | Temp 97.6°F | Resp 16 | Ht 63.0 in | Wt 141.0 lb

## 2023-11-27 DIAGNOSIS — C3490 Malignant neoplasm of unspecified part of unspecified bronchus or lung: Secondary | ICD-10-CM | POA: Diagnosis not present

## 2023-11-27 DIAGNOSIS — C3431 Malignant neoplasm of lower lobe, right bronchus or lung: Secondary | ICD-10-CM | POA: Diagnosis not present

## 2023-11-27 NOTE — Progress Notes (Signed)
 Remote ICD transmission.

## 2023-11-27 NOTE — Telephone Encounter (Signed)
Scheduled appointments with the patient

## 2023-11-27 NOTE — Progress Notes (Signed)
 [facility] [phone]   [phone]  OFFICE PROGRESS NOTE  Center, [facility] [address] [place] [address]  DIAGNOSIS: Recurrent non-small cell lung cancer initially diagnosed as stage IB (T1b, N0, M0) non-small cell lung cancer, adenocarcinoma presented with left lower lobe lung nodule diagnosed in September 2022.  She also has right lower lobe pulmonary nodule diagnosed in October 2024.   PRIOR THERAPY:  1) SBRT under the care of Dr. Shannon. Last treatment on 02/15/21.   2) SBRT to the right lower lobe lung nodule under the care of Dr. Shannon in October 2024.  CURRENT THERAPY: Observation   INTERVAL HISTORY: Karen [name] 78 y.o. female returns to the clinic today for follow-up visit.  Discussed the use of AI scribe software for clinical note transcription with the patient, who gave verbal consent to proceed.  History of Present Illness Karen [name] is a 79 year old female with recurrent non-small cell lung cancer who presents for evaluation and repeat CT scan for restaging.  Initially diagnosed with non-small cell lung cancer in September 2022, she has undergone stereotactic body radiation therapy twice, in 2022 and 2024. She is currently under observation. Recent imaging showed the main mass in the left upper lobe is about the same, possibly slightly smaller, and the mass in the right lower lobe is also about the same. A new 4 mm nodule was identified in the right lower lobe on recent imaging.  She experiences shortness of breath, particularly upon exertion, but does not use supplemental oxygen. She reports severe headaches and pain under her right breast, which makes wearing a bra difficult.  She has noticed a weight loss of approximately three pounds since her last visit, though she is unsure of the cause, whether it be decreased appetite or increased activity during the summer. No nausea, vomiting, or  diarrhea.     MEDICAL HISTORY: Past Medical History:  Diagnosis Date   Acute myocardial infarction, unspecified site, episode of care unspecified    Adenocarcinoma, lung (HCC) 02/16/2021   Allergic rhinitis, cause unspecified    CAD (coronary artery disease)    GERD (gastroesophageal reflux disease)    Headache(784.0)    History of radiation therapy    left lung SBRT 02/08/2021, 02/13/2021, 02/15/2021  Dr Lynwood Shannon   History of radiation therapy    RIght lung- 02/04/23-02/12/23-Dr. Lynwood Shannon   HTN (hypertension)    Other and unspecified hyperlipidemia    Other diseases of lung, not elsewhere classified    Postsurgical aortocoronary bypass status    hx of it.     ALLERGIES:  is allergic to codeine and novocain [procaine].  MEDICATIONS:  Current Outpatient Medications  Medication Sig Dispense Refill   acetaminophen  (TYLENOL ) 500 MG tablet Take 500 mg by mouth every 6 (six) hours as needed for headache.     albuterol  (VENTOLIN  HFA) 108 (90 Base) MCG/ACT inhaler Inhale 2 puffs into the lungs every 6 (six) hours as needed for wheezing or shortness of breath. 54 g 3   amLODipine  (NORVASC ) 5 MG tablet Take 1 tablet (5 mg total) by mouth daily. 90 tablet 3   clopidogrel  (PLAVIX ) 75 MG tablet Take 1 tablet (75 mg total) by mouth daily. 90 tablet 3   cyanocobalamin  (VITAMIN B12) 1000 MCG tablet Take 1 tablet (1,000 mcg total) by mouth daily. 30 tablet 0   cyclobenzaprine (FLEXERIL) 10 MG tablet Take 10 mg by mouth 3 (three) times daily as  needed for muscle spasms.     famotidine  (PEPCID ) 20 MG tablet Take by mouth. daily     furosemide  (LASIX ) 40 MG tablet Take 1 tablet (40 mg total) by mouth daily. 90 tablet 3   isosorbide  mononitrate (IMDUR ) 60 MG 24 hr tablet Take 1 tablet (60 mg total) by mouth 2 (two) times daily. 180 tablet 3   lisinopril  (ZESTRIL ) 10 MG tablet Take 1 tablet (10 mg total) by mouth daily. 90 tablet 1   metoprolol  tartrate (LOPRESSOR ) 25 MG tablet Take 0.5  tablets (12.5 mg total) by mouth 2 (two) times daily. 90 tablet 3   naloxone (NARCAN) nasal spray 4 mg/0.1 mL SMARTSIG:Both Nares     nitroGLYCERIN  (NITROSTAT ) 0.4 MG SL tablet Place 1 tablet (0.4 mg total) under the tongue every 5 (five) minutes as needed for chest pain (X3 DOSES MAX). 75 tablet 2   oxyCODONE -acetaminophen  (PERCOCET) 7.5-325 MG tablet Take 1-2 tablets by mouth every 4 (four) hours as needed for moderate pain (back pain).     polyethylene glycol (MIRALAX  / GLYCOLAX ) 17 g packet Take 17 g by mouth daily. 14 each 0   ranolazine  (RANEXA ) 500 MG 12 hr tablet Take 1 tablet (500 mg total) by mouth 2 (two) times daily. 180 tablet 3   rosuvastatin  (CRESTOR ) 20 MG tablet Take 1 tablet (20 mg total) by mouth daily. 90 tablet 3   Tiotropium Bromide -Olodaterol (STIOLTO RESPIMAT ) 2.5-2.5 MCG/ACT AERS Inhale 2 puffs into the lungs daily. 4 g 11   VITAMIN D, ERGOCALCIFEROL, PO Take by mouth. weekly     No current facility-administered medications for this visit.    SURGICAL HISTORY:  Past Surgical History:  Procedure Laterality Date   CARDIAC CATHETERIZATION     CARDIAC DEFIBRILLATOR PLACEMENT  03/29/2006   [employer]. remote-yes.    COLONOSCOPY WITH PROPOFOL  N/A 06/30/2023   Procedure: COLONOSCOPY WITH PROPOFOL ;  Surgeon: Dianna Specking, MD;  Location: [facility];  Service: Gastroenterology;  Laterality: N/A;   CORONARY ARTERY BYPASS GRAFT     x4   CORONARY STENT INTERVENTION N/A 05/11/2022   Procedure: CORONARY STENT INTERVENTION;  Surgeon: Wonda Sharper, MD;  Location: [facility] INVASIVE CV LAB;  Service: Cardiovascular;  Laterality: N/A;   CORONARY/GRAFT ACUTE MI REVASCULARIZATION N/A 05/08/2022   Procedure: Coronary/Graft Acute MI Revascularization;  Surgeon: Burnard Debby LABOR, MD;  Location: [facility] INVASIVE CV LAB;  Service: Cardiovascular;  Laterality: N/A;   IMPLANTABLE CARDIOVERTER DEFIBRILLATOR GENERATOR CHANGE N/A 03/03/2013   Procedure: IMPLANTABLE CARDIOVERTER DEFIBRILLATOR GENERATOR  CHANGE;  Surgeon: Elspeth JAYSON Sage, MD;  Location: [facility] CATH LAB;  Service: Cardiovascular;  Laterality: N/A;   IR ANGIOGRAM VISCERAL SELECTIVE  05/06/2022   IR AORTAGRAM ABDOMINAL SERIALOGRAM  05/06/2022   IR EMBO ART  VEN HEMORR LYMPH EXTRAV  INC GUIDE ROADMAPPING  05/06/2022   IR US  GUIDE VASC ACCESS RIGHT  05/06/2022   LEFT HEART CATH AND CORONARY ANGIOGRAPHY N/A 05/08/2022   Procedure: LEFT HEART CATH AND CORONARY ANGIOGRAPHY;  Surgeon: Burnard Debby LABOR, MD;  Location: [facility] INVASIVE CV LAB;  Service: Cardiovascular;  Laterality: N/A;   LEFT HEART CATH AND CORS/GRAFTS ANGIOGRAPHY N/A 05/11/2022   Procedure: LEFT HEART CATH AND CORS/GRAFTS ANGIOGRAPHY;  Surgeon: Wonda Sharper, MD;  Location: [facility] INVASIVE CV LAB;  Service: Cardiovascular;  Laterality: N/A;   POLYPECTOMY  06/30/2023   Procedure: POLYPECTOMY;  Surgeon: Dianna Specking, MD;  Location: [facility];  Service: Gastroenterology;;   stent implant     x1. possibly x2.     REVIEW OF SYSTEMS:  A comprehensive review of systems was negative except for: Constitutional: positive for fatigue Respiratory: positive for dyspnea on exertion   PHYSICAL EXAMINATION: General appearance: alert, cooperative, fatigued, and no distress Head: Normocephalic, without obvious abnormality, atraumatic Neck: no adenopathy, no JVD, supple, symmetrical, trachea midline, and thyroid not enlarged, symmetric, no tenderness/mass/nodules Lymph nodes: Cervical, supraclavicular, and axillary nodes normal. Resp: clear to auscultation bilaterally Back: symmetric, no curvature. ROM normal. No CVA tenderness. Cardio: regular rate and rhythm, S1, S2 normal, no murmur, click, rub or gallop GI: soft, non-tender; bowel sounds normal; no masses,  no organomegaly Extremities: extremities normal, atraumatic, no cyanosis or edema  ECOG PERFORMANCE STATUS: 1 - Symptomatic but completely ambulatory  Blood pressure 132/82, temperature 97.6 F (36.4 C), temperature source  Temporal, resp. rate 16, height 5' 3 (1.6 m), weight 141 lb (64 kg), SpO2 100%.  LABORATORY DATA: Lab Results  Component Value Date   WBC 3.7 (L) 11/21/2023   HGB 12.1 11/21/2023   HCT 35.4 (L) 11/21/2023   MCV 92.7 11/21/2023   PLT 179 11/21/2023      Chemistry      Component Value Date/Time   NA 142 11/21/2023 1332   NA 142 06/06/2022 1423   K 3.4 (L) 11/21/2023 1332   CL 108 11/21/2023 1332   CO2 26 11/21/2023 1332   BUN 12 11/21/2023 1332   BUN 12 06/06/2022 1423   CREATININE 1.15 (H) 11/21/2023 1332      Component Value Date/Time   CALCIUM  9.0 11/21/2023 1332   ALKPHOS 63 11/21/2023 1332   AST 14 (L) 11/21/2023 1332   ALT 11 11/21/2023 1332   BILITOT 0.5 11/21/2023 1332       RADIOGRAPHIC STUDIES: CT Chest W Contrast Result Date: 11/22/2023 CLINICAL DATA:  Non-small cell lung cancer, staging. * Tracking Code: BO * EXAM: CT CHEST WITH CONTRAST TECHNIQUE: Multidetector CT imaging of the chest was performed during intravenous contrast administration. RADIATION DOSE REDUCTION: This exam was performed according to the departmental dose-optimization program which includes automated exposure control, adjustment of the mA and/or kV according to patient size and/or use of iterative reconstruction technique. CONTRAST:  60mL OMNIPAQUE  IOHEXOL  300 MG/ML  SOLN COMPARISON:  Multiple priors including PET-CT August 18, 2023 FINDINGS: Cardiovascular: Left chest cardiac device with stable leads. Prior median sternotomy and CABG. Aortic atherosclerosis. Coronary artery calcifications. Mediastinum/Nodes: No suspicious thyroid nodule. No pathologically enlarged mediastinal, hilar or axillary lymph nodes. Mild diffuse esophageal wall thickening. Lungs/Pleura: Similar posterior left upper lobe consolidative process with architectural distortion and adjacent coarse interstitial opacities measuring 3.8 x 2.2 cm on image 53/7 previously 3.9 x 2.6 when remeasured for consistency, mildly metabolic on  prior PET-CT. Irregular nodular consolidation in the superior segment of the right lower lobe with adjacent reticulations and volume loss measures 2.4 x 2.1 cm on image 78/7 previously 2.9 x 2.0 cm, this was hypermetabolic on prior PET-CT with reported SPRT approximately 9 months ago. Stable 7 mm pulmonary nodule in the subpleural right lower lobe on image 121/7, not well seen on prior PET-CT without abnormal hypermetabolism. New 4 mm solid nodule in the right lower lobe on image 83/7. Upper Abdomen: No acute abnormality. Musculoskeletal: No suspicious osseous lesion. IMPRESSION: 1. Irregular nodular consolidation in the superior segment of the right lower lobe with adjacent reticulations and volume loss measures 2.4 x 2.1 cm previously 2.9 x 2.0 cm, this was hypermetabolic on prior PET-CT with reported SPRT approximately 9 months ago. Within the realm of and favored to reflect  post treatment change. Suggest continued attention on follow-up imaging. 2. Similar posterior left upper lobe consolidative process with architectural distortion and adjacent coarse interstitial opacities, mildly metabolic on prior PET-CT. Compatible with post treatment change. Suggest continued attention on follow-up imaging. 3. Stable 7 mm pulmonary nodule in the subpleural right lower lobe, not well seen on prior PET-CT without abnormal hypermetabolism. Suggest continued attention on follow-up imaging. 4. New 4 mm solid nodule in the right lower lobe. Suggest continued attention on follow-up imaging. 5. Mild diffuse esophageal wall thickening, suggestive of esophagitis. 6. Aortic atherosclerosis. Aortic Atherosclerosis (ICD10-I70.0). Electronically Signed   By: Reyes Holder M.D.   On: 11/22/2023 13:37    ASSESSMENT AND PLAN: This is a very pleasant 78 years old African-American female with Stage IB (T1b, N0, M0) non-small cell lung cancer, adenocarcinoma presented with left lower lobe lung nodule diagnosed in September 2022.  She is  status post SBRT under the care of Dr. Shannon. Last treatment on 02/15/21.  Unfortunately her last CT scan showed bilateral pulmonary opacities suspicious for low-grade adenocarcinoma but inflammatory process could not be completely ruled out especially with her aspiration issues. Previous CT scan of the chest in October 2023 showed stable disease with stable bilateral pulmonary nodules that did not resolve in the interval but has been stable since the December 06, 2021 scan. The patient underwent SBRT to the right lower lobe lung nodule under the care of Dr. Shannon completed in October 2024. She is currently on observation.  She is here today for evaluation with repeat CT scan of the chest for restaging of her disease. Her scan showed no concerning findings for disease recurrence or metastasis except for a 4 mm right lower lobe nodules that need close monitoring. Assessment and Plan Assessment & Plan Recurrent non-small cell lung cancer with bilateral lung involvement and new right lower lobe pulmonary nodule Recurrent non-small cell lung cancer with well-managed masses in the left upper lobe and right lower lobe. A new 4 mm nodule in the right lower lobe identified on recent CT scan requires monitoring. - Schedule follow-up CT scan in 4 months for restaging.  Shortness of breath on exertion Shortness of breath on exertion with oxygen saturation at 100% on room air, indicating adequate oxygenation.  Headache Reports of severe headaches without further details or immediate concerns addressed.  Unintentional weight loss Unintentional weight loss of 3 pounds since last visit, possibly related to decreased appetite or increased activity. - Monitor weight and appetite.  Right breast area chest pain Pain under the right breast, with no concerning findings on recent scan in that area. - Advise to go to the emergency department if the pain worsens. The patient was advised to call immediately if she  has any other concerning symptoms in the interval.  The patient voices understanding of current disease status and treatment options and is in agreement with the current care plan.  All questions were answered. The patient knows to call the clinic with any problems, questions or concerns. We can certainly see the patient much sooner if necessary. The total time spent in the appointment was 20 minutes.  Disclaimer: This note was dictated with voice recognition software. Similar sounding words can inadvertently be transcribed and may not be corrected upon review.

## 2024-01-14 ENCOUNTER — Encounter: Payer: Self-pay | Admitting: Emergency Medicine

## 2024-01-14 ENCOUNTER — Ambulatory Visit: Admitting: Emergency Medicine

## 2024-01-14 VITALS — BP 100/62 | HR 80 | Temp 99.7°F | Ht 62.0 in | Wt 142.2 lb

## 2024-01-14 DIAGNOSIS — Z23 Encounter for immunization: Secondary | ICD-10-CM

## 2024-01-14 DIAGNOSIS — F17210 Nicotine dependence, cigarettes, uncomplicated: Secondary | ICD-10-CM

## 2024-01-14 DIAGNOSIS — C3491 Malignant neoplasm of unspecified part of right bronchus or lung: Secondary | ICD-10-CM | POA: Diagnosis not present

## 2024-01-14 DIAGNOSIS — J449 Chronic obstructive pulmonary disease, unspecified: Secondary | ICD-10-CM | POA: Diagnosis not present

## 2024-01-14 MED ORDER — ALBUTEROL SULFATE HFA 108 (90 BASE) MCG/ACT IN AERS
2.0000 | INHALATION_SPRAY | Freq: Four times a day (QID) | RESPIRATORY_TRACT | 3 refills | Status: AC | PRN
Start: 2024-01-14 — End: ?

## 2024-01-14 MED ORDER — STIOLTO RESPIMAT 2.5-2.5 MCG/ACT IN AERS: 2.0000 | INHALATION_SPRAY | Freq: Every day | RESPIRATORY_TRACT | 11 refills | Status: AC

## 2024-01-14 NOTE — Addendum Note (Signed)
 Addended byBETHA FRIES, Mikaiah Stoffer A on: 01/14/2024 04:03 PM   Modules accepted: Orders

## 2024-01-14 NOTE — Assessment & Plan Note (Signed)
 She is somewhat limited by immobility, COPD, her history of lung cancer and its treatment.  She is not very active.  She does use albuterol  about once daily and it does help her with her breathing and her functional capacity.  She remains on Stiolto and does feel that she is getting benefit.  No flares, no antibiotics or prednisone.  Plan to continue same regimen.  She needs a flu shot today and we discussed getting the COVID-19 vaccine as well.

## 2024-01-14 NOTE — Assessment & Plan Note (Signed)
 Continues to smoke about 5 cigarettes daily.  We discussed the importance of cessation today she understands that she is at high risk for progression/recurrence of lung cancer and that it will also impact her COPD and functional capacity.  She has quit before but she believes the stressors in her life are such that she is not in a position to try to stop right now.  We will continue to revisit

## 2024-01-14 NOTE — Patient Instructions (Signed)
 Please continue your Stiolto 2 puffs once daily. Keep your albuterol  available to use 2 puffs when needed for shortness of breath, chest tightness, wheezing.  We will refill this for you today. Flu shot today Get your repeat CT scan of the chest as planned by Dr. Sherrod with oncology.  Follow with them as planned Follow in our office in 6 months.  Follow Dr. Shelah in 1 year or sooner if you have problems.

## 2024-01-14 NOTE — Assessment & Plan Note (Signed)
 She has been treated bilaterally with SBRT.  Following surveillance chest imaging.  Last in 11/21/2023 was reassuring.  Suspect her next scan will be in 6 months.  She will follow with oncology to review

## 2024-01-14 NOTE — Progress Notes (Signed)
 Subjective:    Patient ID: Karen [name], female    DOB: [birth year], 78 y.o.   MRN: [id number]  COPD Her past medical history is significant for COPD.    ROV 02/12/2023 --78 year old woman with a history of left lower lobe adenocarcinoma and SBRT 12/2020, COPD.  She has residual persistent nodular disease that has been followed with serial imaging.  This included some increase in size of her treated left lower lobe mixed density lesion and also a right lower lobe groundglass lesion that was felt to be most consistent with another adenocarcinoma this was treated empirically with SBRT, last treatment is supposed to be today.  She has tried scheduled BD before but not reliably.  She is unsure whether they have ever been beneficial.  She has used her albuterol  in conjunction with her cardiac rehab. She does not have any right now. She has Stiolto or Spiriva  - she isn't sure which. She uses 3-5x a week, not every day.  She wonders about possible FMLA paperwork for her son whose had to take care of her, other family members with medical issues, transportation etc.  It sounds like he has left his job so I am not sure whether FMLA will be possible.  ROV 01/14/2024 --Karen [name] is a 51 followed for COPD and history of left lower lobe adenocarcinoma that was treated with SBRT in 2022.  For recent treatment empirically of a right lower lobe ground glass lesion suspected to be another adenocarcinoma 01/2023.  She has been followed with serial imaging, now in oncology, last was 11/21/2023.  Currently managed on Stiolto.  She uses albuterol  approximately 1x a day. No flares, pred or abx.  She reports good days and bad days. She feels that she tires more quickly. She ambulates w a cane. Her son helps w her care she has some daily cough with mucous, especially at night. White mucous. She remains on stiolto. She needs her flu shot. She wants the covid shot. She is still smoking about 5 cig a day.   CT scan of the  chest 11/21/2023 reviewed by me, shows decrease in size of the irregular superior segmental right lower lobe opacity now 2.4 x 2.1 cm, unchanged posterior left upper lobe consolidation post SBRT treatment, stable 7 mm subpleural right lower lobe nodule, new 4 mm right lower lobe nodule  Review of Systems As per HPI  Past Medical History:  Diagnosis Date   Acute myocardial infarction, unspecified site, episode of care unspecified    Adenocarcinoma, lung (HCC) 02/16/2021   Allergic rhinitis, cause unspecified    CAD (coronary artery disease)    GERD (gastroesophageal reflux disease)    Headache(784.0)    History of radiation therapy    left lung SBRT 02/08/2021, 02/13/2021, 02/15/2021  Dr Lynwood Nasuti   History of radiation therapy    RIght lung- 02/04/23-02/12/23-Dr. Lynwood Nasuti   HTN (hypertension)    Other and unspecified hyperlipidemia    Other diseases of lung, not elsewhere classified    Postsurgical aortocoronary bypass status    hx of it.      Family History  Problem Relation Age of Onset   Heart attack Mother    Heart disease Mother    Breast cancer Maternal Aunt      Social History   Socioeconomic History   Marital status: Legally Separated    Spouse name: Not on file   Number of children: Not on file   Years of education: 2  Highest education level: 11th grade  Occupational History   Not on file  Tobacco Use   Smoking status: Former    Current packs/day: 0.00    Average packs/day: 0.5 packs/day for 56.1 years (28.1 ttl pk-yrs)    Types: Cigarettes    Start date: 25    Quit date: 06/05/2022    Years since quitting: 1.6   Smokeless tobacco: Never   Tobacco comments:    Smokes 1 cigarette every once in a while [facility] 08/22/22  Vaping Use   Vaping status: Never Used  Substance and Sexual Activity   Alcohol use: No   Drug use: No   Sexual activity: Not Currently  Other Topics Concern   Not on file  Social History Narrative   Lives with 2 sons and  granddaughter   Social Drivers of Health   Financial Resource Strain: Not on file  Food Insecurity: Food Insecurity Present (01/15/2023)   Hunger Vital Sign    Worried About Running Out of Food in the Last Year: Often true    Ran Out of Food in the Last Year: Often true  Transportation Needs: No Transportation Needs (01/15/2023)   PRAPARE - Administrator, Civil Service (Medical): No    Lack of Transportation (Non-Medical): No  Physical Activity: Not on file  Stress: Not on file  Social Connections: Not on file  Intimate Partner Violence: Not At Risk (01/15/2023)   Humiliation, Afraid, Rape, and Kick questionnaire    Fear of Current or Ex-Partner: No    Emotionally Abused: No    Physically Abused: No    Sexually Abused: No    No hx TB exposure [place] and [place]   Allergies  Allergen Reactions   Codeine Itching and Swelling   Novocain [Procaine] Hives and Swelling    Eyes swelling     Outpatient Medications Prior to Visit  Medication Sig Dispense Refill   acetaminophen  (TYLENOL ) 500 MG tablet Take 500 mg by mouth every 6 (six) hours as needed for headache.     amLODipine  (NORVASC ) 5 MG tablet Take 1 tablet (5 mg total) by mouth daily. 90 tablet 3   clopidogrel  (PLAVIX ) 75 MG tablet Take 1 tablet (75 mg total) by mouth daily. 90 tablet 3   cyanocobalamin  (VITAMIN B12) 1000 MCG tablet Take 1 tablet (1,000 mcg total) by mouth daily. 30 tablet 0   cyclobenzaprine (FLEXERIL) 10 MG tablet Take 10 mg by mouth 3 (three) times daily as needed for muscle spasms.     famotidine  (PEPCID ) 20 MG tablet Take by mouth. daily     furosemide  (LASIX ) 40 MG tablet Take 1 tablet (40 mg total) by mouth daily. 90 tablet 3   isosorbide  mononitrate (IMDUR ) 60 MG 24 hr tablet Take 1 tablet (60 mg total) by mouth 2 (two) times daily. 180 tablet 3   lisinopril  (ZESTRIL ) 10 MG tablet TAKE 1 TABLET BY MOUTH EVERY DAY 90 tablet 2   metoprolol  tartrate  (LOPRESSOR ) 25 MG tablet Take 0.5 tablets (12.5 mg total) by mouth 2 (two) times daily. 90 tablet 3   naloxone (NARCAN) nasal spray 4 mg/0.1 mL SMARTSIG:Both Nares     nitroGLYCERIN  (NITROSTAT ) 0.4 MG SL tablet Place 1 tablet (0.4 mg total) under the tongue every 5 (five) minutes as needed for chest pain (X3 DOSES MAX). 75 tablet 2   oxyCODONE -acetaminophen  (PERCOCET) 7.5-325 MG tablet Take 1-2 tablets by mouth every 4 (four) hours  as needed for moderate pain (back pain).     polyethylene glycol (MIRALAX  / GLYCOLAX ) 17 g packet Take 17 g by mouth daily. 14 each 0   ranolazine  (RANEXA ) 500 MG 12 hr tablet Take 1 tablet (500 mg total) by mouth 2 (two) times daily. 180 tablet 3   rosuvastatin  (CRESTOR ) 20 MG tablet Take 1 tablet (20 mg total) by mouth daily. 90 tablet 3   Tiotropium Bromide -Olodaterol (STIOLTO RESPIMAT ) 2.5-2.5 MCG/ACT AERS Inhale 2 puffs into the lungs daily. 4 g 11   VITAMIN D, ERGOCALCIFEROL, PO Take by mouth. weekly     albuterol  (VENTOLIN  HFA) 108 (90 Base) MCG/ACT inhaler Inhale 2 puffs into the lungs every 6 (six) hours as needed for wheezing or shortness of breath. 54 g 3   No facility-administered medications prior to visit.          Objective:   Physical Exam  Vitals:   01/14/24 1535  BP: 100/62  Pulse: 80  Temp: 99.7 F (37.6 C)  SpO2: 100%  Weight: 142 lb 3.2 oz (64.5 kg)  Height: 5' 2 (1.575 m)   Gen: Pleasant, well-nourished, in no distress,  normal affect  ENT: No lesions,  mouth clear, dentures, oropharynx clear, no postnasal drip  Neck: No JVD, no stridor  Lungs: No use of accessory muscles, no crackles or wheezing on normal respiration, no wheeze  Cardiovascular: RRR, heart sounds normal, no murmur or gallops, no peripheral edema  Musculoskeletal: No deformities, no cyanosis or clubbing  Neuro: alert, awake, non focal  Skin: Warm, no lesions or rash     Assessment & Plan:  Stage 2 moderate COPD by GOLD classification (HCC) She is  somewhat limited by immobility, COPD, her history of lung cancer and its treatment.  She is not very active.  She does use albuterol  about once daily and it does help her with her breathing and her functional capacity.  She remains on Stiolto and does feel that she is getting benefit.  No flares, no antibiotics or prednisone.  Plan to continue same regimen.  She needs a flu shot today and we discussed getting the COVID-19 vaccine as well.  Adenocarcinoma, lung (HCC) She has been treated bilaterally with SBRT.  Following surveillance chest imaging.  Last in 11/21/2023 was reassuring.  Suspect her next scan will be in 6 months.  She will follow with oncology to review  Nicotine  dependence, cigarettes, uncomplicated Continues to smoke about 5 cigarettes daily.  We discussed the importance of cessation today she understands that she is at high risk for progression/recurrence of lung cancer and that it will also impact her COPD and functional capacity.  She has quit before but she believes the stressors in her life are such that she is not in a position to try to stop right now.  We will continue to revisit    Lamar Chris, MD, PhD 01/14/2024, 3:58 PM [facility] [phone] or if no answer before 7:00PM call [phone] For any issues after 7:00PM please call eLink [phone]

## 2024-01-26 ENCOUNTER — Ambulatory Visit: Payer: 59

## 2024-01-26 DIAGNOSIS — I4901 Ventricular fibrillation: Secondary | ICD-10-CM | POA: Diagnosis not present

## 2024-01-26 LAB — CUP PACEART REMOTE DEVICE CHECK
Battery Remaining Longevity: 36 mo
Battery Remaining Percentage: 38 %
Brady Statistic RV Percent Paced: 0 %
Date Time Interrogation Session: 20250929094200
HighPow Impedance: 61 Ohm
Implantable Lead Connection Status: 753985
Implantable Lead Implant Date: 20071205
Implantable Lead Location: 753860
Implantable Lead Model: 137
Implantable Lead Serial Number: 102363
Implantable Pulse Generator Implant Date: 20141105
Lead Channel Impedance Value: 416 Ohm
Lead Channel Pacing Threshold Amplitude: 0.5 V
Lead Channel Pacing Threshold Pulse Width: 0.5 ms
Lead Channel Setting Pacing Amplitude: 2 V
Lead Channel Setting Pacing Pulse Width: 0.5 ms
Lead Channel Setting Sensing Sensitivity: 0.6 mV
Pulse Gen Serial Number: 126146

## 2024-01-28 NOTE — Progress Notes (Signed)
 Remote ICD Transmission

## 2024-02-01 ENCOUNTER — Ambulatory Visit: Payer: Self-pay | Admitting: Cardiology

## 2024-02-19 ENCOUNTER — Telehealth: Payer: Self-pay | Admitting: Cardiovascular Disease

## 2024-02-19 MED ORDER — ROSUVASTATIN CALCIUM 20 MG PO TABS: 20.0000 mg | ORAL_TABLET | Freq: Every day | ORAL | 1 refills | Status: AC

## 2024-02-19 NOTE — Telephone Encounter (Signed)
 RX sent in

## 2024-02-19 NOTE — Telephone Encounter (Signed)
*  STAT* If patient is at the pharmacy, call can be transferred to refill team.   1. Which medications need to be refilled? (please list name of each medication and dose if known)  rosuvastatin  (CRESTOR ) 20 MG tablet   2. Which pharmacy/location (including street and city if local pharmacy) is medication to be sent to? [id number] - [place], [place] - [address]  3. Do they need a 30 day or 90 day supply?   90 day supply

## 2024-03-29 ENCOUNTER — Inpatient Hospital Stay: Attending: Physician Assistant

## 2024-03-29 DIAGNOSIS — C3490 Malignant neoplasm of unspecified part of unspecified bronchus or lung: Secondary | ICD-10-CM

## 2024-03-29 DIAGNOSIS — C3431 Malignant neoplasm of lower lobe, right bronchus or lung: Secondary | ICD-10-CM | POA: Insufficient documentation

## 2024-03-29 DIAGNOSIS — R634 Abnormal weight loss: Secondary | ICD-10-CM | POA: Insufficient documentation

## 2024-03-29 DIAGNOSIS — F17210 Nicotine dependence, cigarettes, uncomplicated: Secondary | ICD-10-CM | POA: Insufficient documentation

## 2024-03-29 LAB — CBC WITH DIFFERENTIAL (CANCER CENTER ONLY)
Abs Immature Granulocytes: 0.01 K/uL (ref 0.00–0.07)
Basophils Absolute: 0 K/uL (ref 0.0–0.1)
Basophils Relative: 1 %
Eosinophils Absolute: 0 K/uL (ref 0.0–0.5)
Eosinophils Relative: 1 %
HCT: 39.1 % (ref 36.0–46.0)
Hemoglobin: 13 g/dL (ref 12.0–15.0)
Immature Granulocytes: 0 %
Lymphocytes Relative: 44 %
Lymphs Abs: 1.9 K/uL (ref 0.7–4.0)
MCH: 31.4 pg (ref 26.0–34.0)
MCHC: 33.2 g/dL (ref 30.0–36.0)
MCV: 94.4 fL (ref 80.0–100.0)
Monocytes Absolute: 0.3 K/uL (ref 0.1–1.0)
Monocytes Relative: 7 %
Neutro Abs: 2 K/uL (ref 1.7–7.7)
Neutrophils Relative %: 47 %
Platelet Count: 179 K/uL (ref 150–400)
RBC: 4.14 MIL/uL (ref 3.87–5.11)
RDW: 12.2 % (ref 11.5–15.5)
WBC Count: 4.2 K/uL (ref 4.0–10.5)
nRBC: 0 % (ref 0.0–0.2)

## 2024-03-29 LAB — CMP (CANCER CENTER ONLY)
ALT: 15 U/L (ref 0–44)
AST: 23 U/L (ref 15–41)
Albumin: 4.4 g/dL (ref 3.5–5.0)
Alkaline Phosphatase: 58 U/L (ref 38–126)
Anion gap: 12 (ref 5–15)
BUN: 13 mg/dL (ref 8–23)
CO2: 27 mmol/L (ref 22–32)
Calcium: 9.6 mg/dL (ref 8.9–10.3)
Chloride: 103 mmol/L (ref 98–111)
Creatinine: 1.16 mg/dL — ABNORMAL HIGH (ref 0.44–1.00)
GFR, Estimated: 48 mL/min — ABNORMAL LOW (ref >60)
Glucose, Bld: 96 mg/dL (ref 70–99)
Potassium: 3.5 mmol/L (ref 3.5–5.1)
Sodium: 143 mmol/L (ref 135–145)
Total Bilirubin: 0.5 mg/dL (ref 0.0–1.2)
Total Protein: 7.1 g/dL (ref 6.5–8.1)

## 2024-04-02 ENCOUNTER — Telehealth: Payer: Self-pay | Admitting: Medical Oncology

## 2024-04-02 ENCOUNTER — Telehealth: Payer: Self-pay | Admitting: Physician Assistant

## 2024-04-02 NOTE — Progress Notes (Deleted)
 [facility] OFFICE PROGRESS NOTE  Center, [facility] [address] [place] [address]  DIAGNOSIS: Recurrent non-small cell lung cancer initially diagnosed as stage IB (T1b, N0, M0) non-small cell lung cancer, adenocarcinoma presented with left lower lobe lung nodule diagnosed in September 2022.  She also has right lower lobe pulmonary nodule diagnosed in October 2024.   PRIOR THERAPY: 1) SBRT under the care of Dr. Shannon. Last treatment on 02/15/21.   2) SBRT to the right lower lobe lung nodule under the care of Dr. Shannon in October 2024.  CURRENT THERAPY: Observation   INTERVAL HISTORY: Karen [name] 78 y.o. female returns clinic today for follow-up visit.  The patient was last seen in the clinic in July 2025 by Dr. Sherrod.  The patient is on observation for her history of stage I non-small cell lung cancer.  She is status post SBRT under the care of Dr. Shannon.  Today she denies any fever, chills, or night sweats.  She has baseline dyspnea on exertion.  Cough?  She denies any chest pain or hemoptysis.  Denies any nausea, vomiting, diarrhea, or constipation.  Headaches?  She denies any vision changes.  She recently had a restaging CT scan.  She is here today for evaluation and to review her scan results.     MEDICAL HISTORY: Past Medical History:  Diagnosis Date   Acute myocardial infarction, unspecified site, episode of care unspecified    Adenocarcinoma, lung (HCC) 02/16/2021   Allergic rhinitis, cause unspecified    CAD (coronary artery disease)    GERD (gastroesophageal reflux disease)    Headache(784.0)    History of radiation therapy    left lung SBRT 02/08/2021, 02/13/2021, 02/15/2021  Dr Lynwood Shannon   History of radiation therapy    RIght lung- 02/04/23-02/12/23-Dr. Lynwood Shannon   HTN (hypertension)    Other and unspecified hyperlipidemia    Other diseases of lung, not elsewhere classified    Postsurgical aortocoronary bypass  status    hx of it.     ALLERGIES:  is allergic to codeine and novocain [procaine].  MEDICATIONS:  Current Outpatient Medications  Medication Sig Dispense Refill   acetaminophen  (TYLENOL ) 500 MG tablet Take 500 mg by mouth every 6 (six) hours as needed for headache.     albuterol  (VENTOLIN  HFA) 108 (90 Base) MCG/ACT inhaler Inhale 2 puffs into the lungs every 6 (six) hours as needed for wheezing or shortness of breath. 54 g 3   amLODipine  (NORVASC ) 5 MG tablet Take 1 tablet (5 mg total) by mouth daily. 90 tablet 3   clopidogrel  (PLAVIX ) 75 MG tablet Take 1 tablet (75 mg total) by mouth daily. 90 tablet 3   cyanocobalamin  (VITAMIN B12) 1000 MCG tablet Take 1 tablet (1,000 mcg total) by mouth daily. 30 tablet 0   cyclobenzaprine (FLEXERIL) 10 MG tablet Take 10 mg by mouth 3 (three) times daily as needed for muscle spasms.     famotidine  (PEPCID ) 20 MG tablet Take by mouth. daily     furosemide  (LASIX ) 40 MG tablet Take 1 tablet (40 mg total) by mouth daily. 90 tablet 3   isosorbide  mononitrate (IMDUR ) 60 MG 24 hr tablet Take 1 tablet (60 mg total) by mouth 2 (two) times daily. 180 tablet 3   lisinopril  (ZESTRIL ) 10 MG tablet TAKE 1 TABLET BY MOUTH EVERY DAY 90 tablet 2   metoprolol  tartrate (LOPRESSOR ) 25 MG tablet Take 0.5 tablets (12.5 mg total) by mouth 2 (two) times daily. 90  tablet 3   naloxone (NARCAN) nasal spray 4 mg/0.1 mL SMARTSIG:Both Nares     nitroGLYCERIN  (NITROSTAT ) 0.4 MG SL tablet Place 1 tablet (0.4 mg total) under the tongue every 5 (five) minutes as needed for chest pain (X3 DOSES MAX). 75 tablet 2   oxyCODONE -acetaminophen  (PERCOCET) 7.5-325 MG tablet Take 1-2 tablets by mouth every 4 (four) hours as needed for moderate pain (back pain).     polyethylene glycol (MIRALAX  / GLYCOLAX ) 17 g packet Take 17 g by mouth daily. 14 each 0   ranolazine  (RANEXA ) 500 MG 12 hr tablet Take 1 tablet (500 mg total) by mouth 2 (two) times daily. 180 tablet 3   rosuvastatin  (CRESTOR ) 20 MG  tablet Take 1 tablet (20 mg total) by mouth daily. 90 tablet 1   Tiotropium Bromide -Olodaterol (STIOLTO RESPIMAT ) 2.5-2.5 MCG/ACT AERS Inhale 2 puffs into the lungs daily. 4 g 11   VITAMIN D, ERGOCALCIFEROL, PO Take by mouth. weekly     No current facility-administered medications for this visit.    SURGICAL HISTORY:  Past Surgical History:  Procedure Laterality Date   CARDIAC CATHETERIZATION     CARDIAC DEFIBRILLATOR PLACEMENT  03/29/2006   [employer]. remote-yes.    COLONOSCOPY WITH PROPOFOL  N/A 06/30/2023   Procedure: COLONOSCOPY WITH PROPOFOL ;  Surgeon: Dianna Specking, MD;  Location: [facility];  Service: Gastroenterology;  Laterality: N/A;   CORONARY ARTERY BYPASS GRAFT     x4   CORONARY STENT INTERVENTION N/A 05/11/2022   Procedure: CORONARY STENT INTERVENTION;  Surgeon: Wonda Sharper, MD;  Location: [facility] INVASIVE CV LAB;  Service: Cardiovascular;  Laterality: N/A;   CORONARY/GRAFT ACUTE MI REVASCULARIZATION N/A 05/08/2022   Procedure: Coronary/Graft Acute MI Revascularization;  Surgeon: Burnard Debby LABOR, MD;  Location: [facility] INVASIVE CV LAB;  Service: Cardiovascular;  Laterality: N/A;   IMPLANTABLE CARDIOVERTER DEFIBRILLATOR GENERATOR CHANGE N/A 03/03/2013   Procedure: IMPLANTABLE CARDIOVERTER DEFIBRILLATOR GENERATOR CHANGE;  Surgeon: Elspeth JAYSON Sage, MD;  Location: [facility] CATH LAB;  Service: Cardiovascular;  Laterality: N/A;   IR ANGIOGRAM VISCERAL SELECTIVE  05/06/2022   IR AORTAGRAM ABDOMINAL SERIALOGRAM  05/06/2022   IR EMBO ART  VEN HEMORR LYMPH EXTRAV  INC GUIDE ROADMAPPING  05/06/2022   IR US  GUIDE VASC ACCESS RIGHT  05/06/2022   LEFT HEART CATH AND CORONARY ANGIOGRAPHY N/A 05/08/2022   Procedure: LEFT HEART CATH AND CORONARY ANGIOGRAPHY;  Surgeon: Burnard Debby LABOR, MD;  Location: [facility] INVASIVE CV LAB;  Service: Cardiovascular;  Laterality: N/A;   LEFT HEART CATH AND CORS/GRAFTS ANGIOGRAPHY N/A 05/11/2022   Procedure: LEFT HEART CATH AND CORS/GRAFTS ANGIOGRAPHY;  Surgeon: Wonda Sharper, MD;  Location: [facility] INVASIVE CV LAB;  Service: Cardiovascular;  Laterality: N/A;   POLYPECTOMY  06/30/2023   Procedure: POLYPECTOMY;  Surgeon: Dianna Specking, MD;  Location: [facility];  Service: Gastroenterology;;   stent implant     x1. possibly x2.     REVIEW OF SYSTEMS:   Review of Systems  Constitutional: Negative for appetite change, chills, fatigue, fever and unexpected weight change.  HENT:   Negative for mouth sores, nosebleeds, sore throat and trouble swallowing.   Eyes: Negative for eye problems and icterus.  Respiratory: Negative for cough, hemoptysis, shortness of breath and wheezing.   Cardiovascular: Negative for chest pain and leg swelling.  Gastrointestinal: Negative for abdominal pain, constipation, diarrhea, nausea and vomiting.  Genitourinary: Negative for bladder incontinence, difficulty urinating, dysuria, frequency and hematuria.   Musculoskeletal: Negative for back pain, gait problem, neck pain and neck stiffness.  Skin: Negative for  itching and rash.  Neurological: Negative for dizziness, extremity weakness, gait problem, headaches, light-headedness and seizures.  Hematological: Negative for adenopathy. Does not bruise/bleed easily.  Psychiatric/Behavioral: Negative for confusion, depression and sleep disturbance. The patient is not nervous/anxious.     PHYSICAL EXAMINATION:  There were no vitals taken for this visit.  ECOG PERFORMANCE STATUS: {CHL ONC ECOG [id number]  Physical Exam  Constitutional: Oriented to person, place, and time and well-developed, well-nourished, and in no distress. No distress.  HENT:  Head: Normocephalic and atraumatic.  Mouth/Throat: Oropharynx is clear and moist. No oropharyngeal exudate.  Eyes: Conjunctivae are normal. Right eye exhibits no discharge. Left eye exhibits no discharge. No scleral icterus.  Neck: Normal range of motion. Neck supple.  Cardiovascular: Normal rate, regular rhythm, normal heart sounds and  intact distal pulses.   Pulmonary/Chest: Effort normal and breath sounds normal. No respiratory distress. No wheezes. No rales.  Abdominal: Soft. Bowel sounds are normal. Exhibits no distension and no mass. There is no tenderness.  Musculoskeletal: Normal range of motion. Exhibits no edema.  Lymphadenopathy:    No cervical adenopathy.  Neurological: Alert and oriented to person, place, and time. Exhibits normal muscle tone. Gait normal. Coordination normal.  Skin: Skin is warm and dry. No rash noted. Not diaphoretic. No erythema. No pallor.  Psychiatric: Mood, memory and judgment normal.  Vitals reviewed.  LABORATORY DATA: Lab Results  Component Value Date   WBC 4.2 03/29/2024   HGB 13.0 03/29/2024   HCT 39.1 03/29/2024   MCV 94.4 03/29/2024   PLT 179 03/29/2024      Chemistry      Component Value Date/Time   NA 143 03/29/2024 1000   NA 142 06/06/2022 1423   K 3.5 03/29/2024 1000   CL 103 03/29/2024 1000   CO2 27 03/29/2024 1000   BUN 13 03/29/2024 1000   BUN 12 06/06/2022 1423   CREATININE 1.16 (H) 03/29/2024 1000      Component Value Date/Time   CALCIUM  9.6 03/29/2024 1000   ALKPHOS 58 03/29/2024 1000   AST 23 03/29/2024 1000   ALT 15 03/29/2024 1000   BILITOT 0.5 03/29/2024 1000       RADIOGRAPHIC STUDIES:  No results found.   ASSESSMENT/PLAN:  Is a very pleasant 78 year old African-American female with stage Ib (T1b, N0, M0) non-small cell lung cancer, adenocarcinoma.  She presented with a left lower lobe lung nodule in September 2022.   She is status post SBRT under the care of Dr. Shannon.  Her last treatment was on 02/15/2021.  She then was found to have bilateral pulmonary opacities suspicious for low-grade adenocarcinoma but inflammatory process could not be excluded.  Then she had a repeat CT scan October 2023 that showed stable disease and stable bilateral nodules.  She underwent SBRT to a right lower lobe lung nodule under the care of Dr. Shannon in  October 2024.  She is currently on observation.  The patient was seen with Dr. Sherrod today.  Dr. Sherrod personally and independently reviewed the scan and discussed results with the patient today.  The scan showed ***.  Dr. Sherrod recommends ***  ***scan  ***biopsy  ***We will see her for labs and follow up in ***   The patient was advised to call immediately if she has any concerning symptoms in the interval. The patient voices understanding of current disease status and treatment options and is in agreement with the current care plan. All questions were answered. The patient knows to call the  clinic with any problems, questions or concerns. We can certainly see the patient much sooner if necessary   No orders of the defined types were placed in this encounter.    I spent {CHL ONC TIME VISIT - DTPQU:[id number]} counseling the patient face to face. The total time spent in the appointment was {CHL ONC TIME VISIT - DTPQU:[id number]}.  Camdan Burdi L Takiyah Bohnsack, PA-C 04/02/24

## 2024-04-02 NOTE — Telephone Encounter (Signed)
 I am scheduled to see this patient next week.  However, her restaging CT scan is not scheduled until January 2026.  I called and left her voicemail and asked for her to call us  back.  I would like for her to call radiology scheduling at [phone] to see if she can move up her CT appointment to December 2025.  Then once her CT scan is rescheduled I will reschedule her follow-up to be approximately 1 week after her CT scan so we can review the results at her office visit.  I left our callback number and asked that she contact us  so we can discuss this in more detail.

## 2024-04-02 NOTE — Telephone Encounter (Signed)
 Pt confirmed appt

## 2024-04-05 ENCOUNTER — Ambulatory Visit: Admitting: Internal Medicine

## 2024-04-05 ENCOUNTER — Telehealth: Payer: Self-pay

## 2024-04-05 NOTE — Telephone Encounter (Signed)
 Attempted to reach the patient regarding their upcoming appointment scheduled for 04/08/24. The patient needs to have a CT scan completed prior to their visit with Cassie, PA.  The patients CT scan may be moved to an earlier date and that the appointment with Cassie, PA can be rescheduled to one week after the scan is completed.   Requested a return call to discuss and confirm scheduling.

## 2024-04-08 ENCOUNTER — Telehealth: Payer: Self-pay

## 2024-04-08 ENCOUNTER — Inpatient Hospital Stay: Admitting: Physician Assistant

## 2024-04-08 NOTE — Telephone Encounter (Signed)
 Rescheduled patients appt with Cassie, PA for 05/11/23 @ 3 PM.  She voiced understanding.

## 2024-04-08 NOTE — Telephone Encounter (Signed)
 Third attempt to try and reach patient with no answer.

## 2024-04-08 NOTE — Telephone Encounter (Signed)
 Second attempt to reach patient in regards to appt today at 130 PM with Cassie, PA.  Patient needs CT scan prior to appt.  LVM and requested a return call.

## 2024-04-09 IMAGING — US US RENAL
1 series · 14 of 25 positions shown · non-contrast
Comparison: None Available.

CLINICAL DATA: Chronic renal disease

EXAM:
RENAL / URINARY TRACT ULTRASOUND COMPLETE

[Series 1: us renal · 0.22mm/px · 14 of 35 slices shown]
[im 1/35]
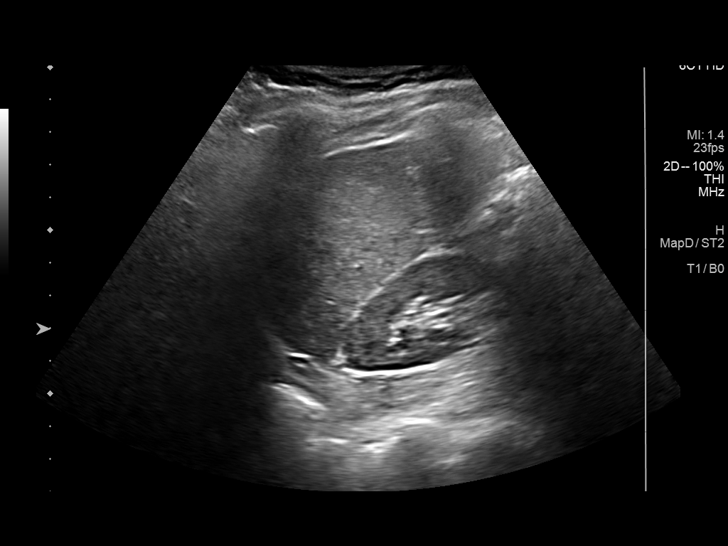
[im 3/35]
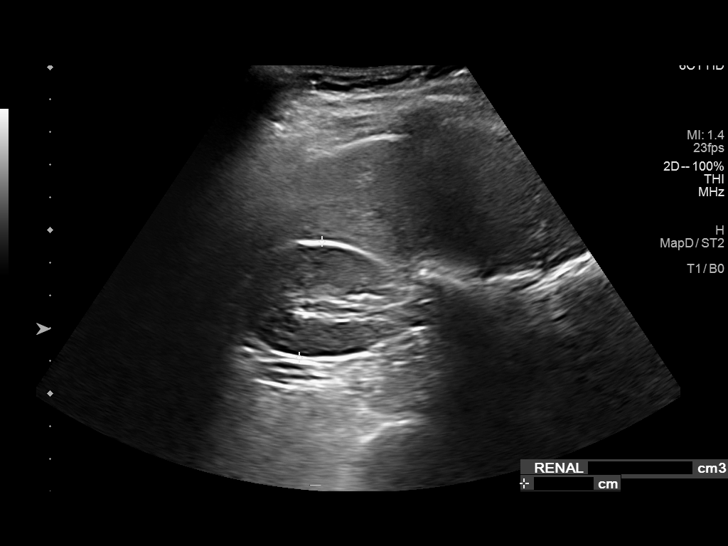
[im 6/35]
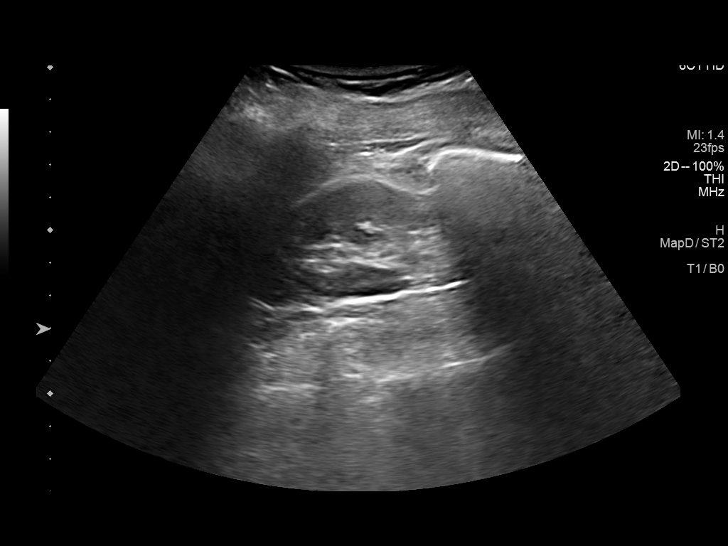
[im 9/35]
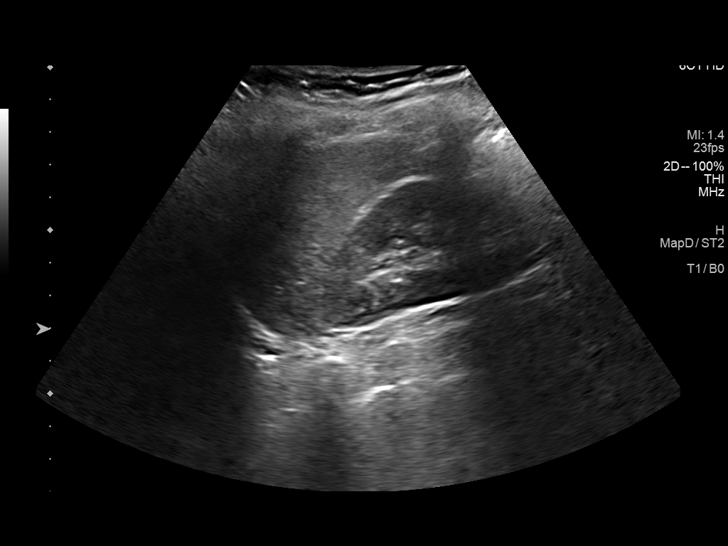
[im 12/35]
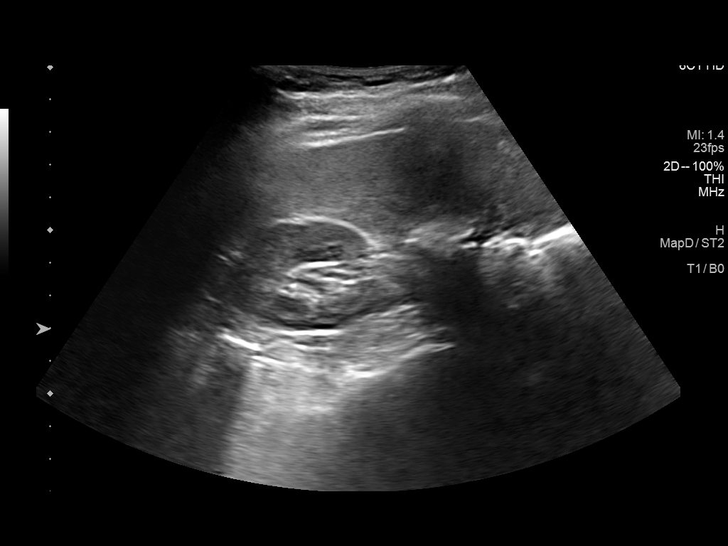
[im 13/35]
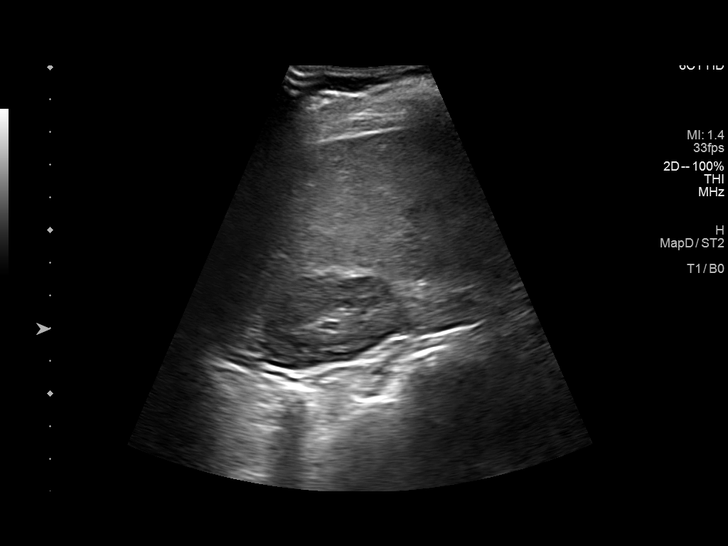
[im 16/35]
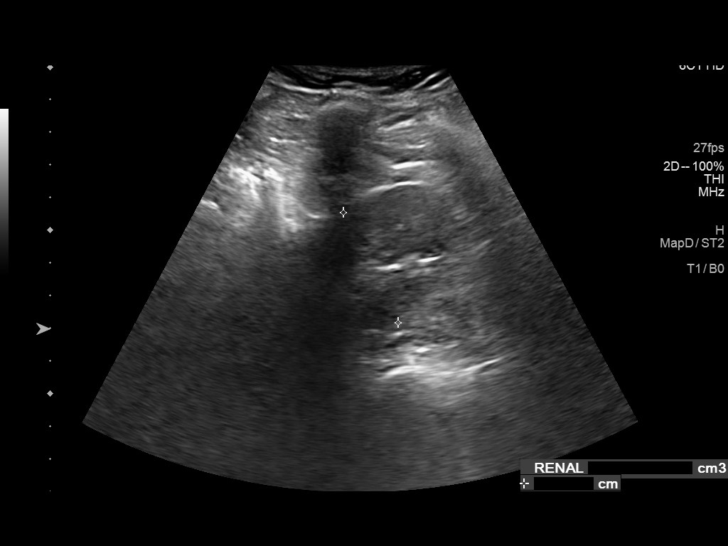
[im 19/35]
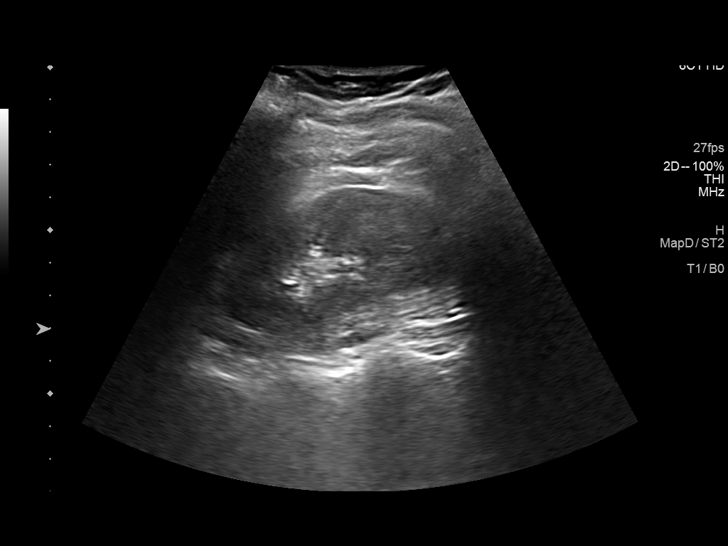
[im 22/35]
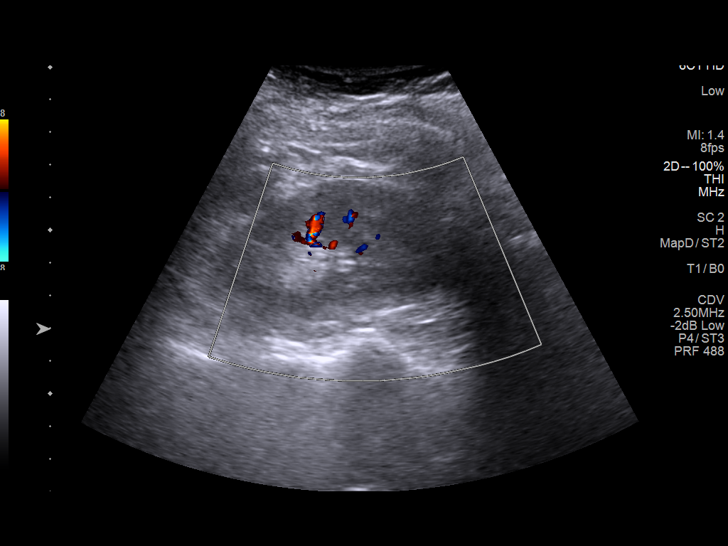
[im 23/35]
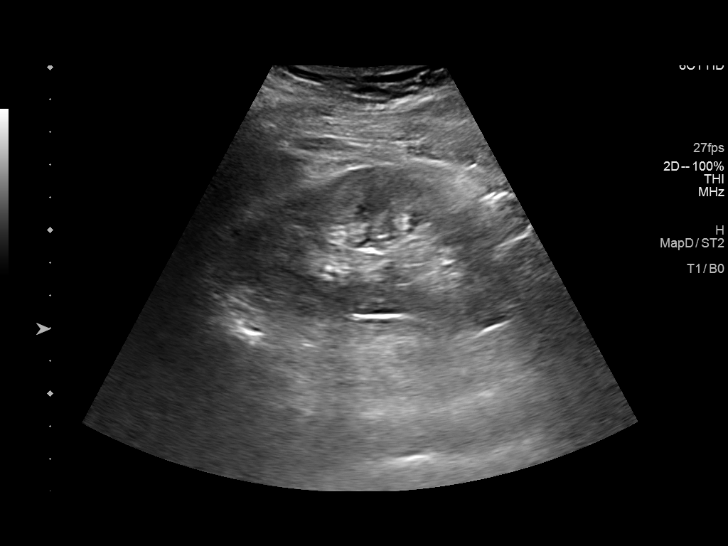
[im 26/35]
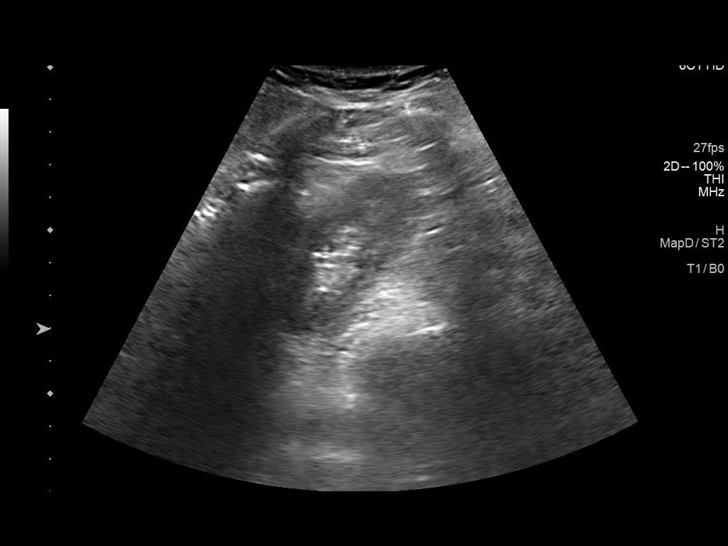
[im 29/35]
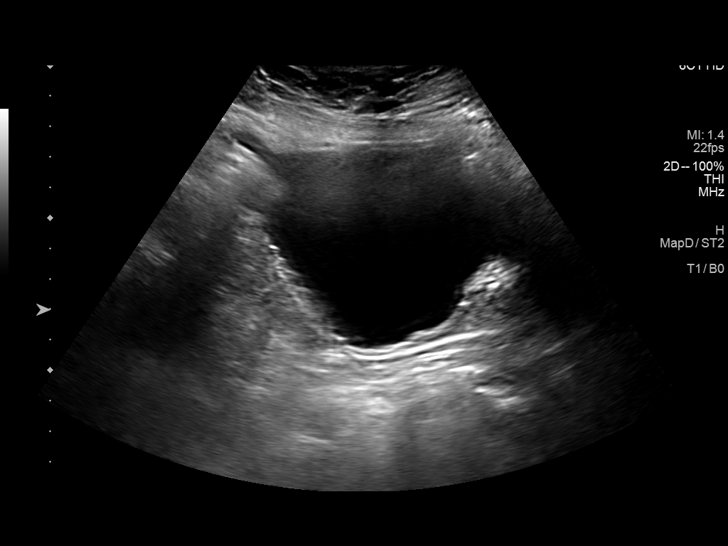
[im 32/35]
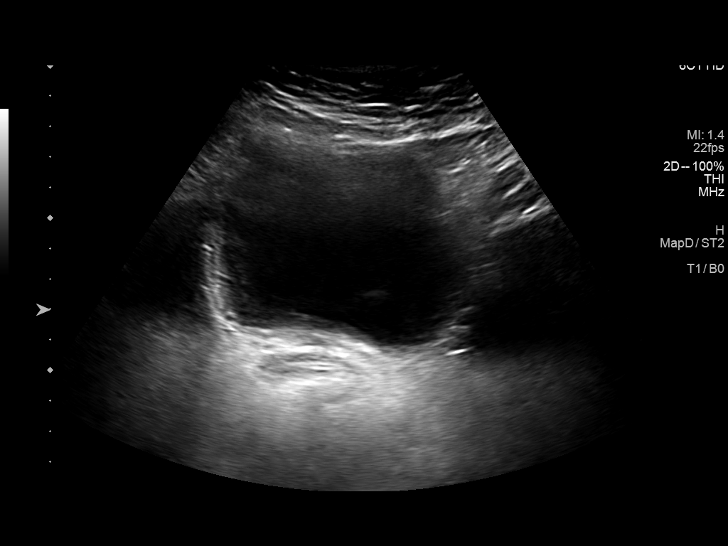
[im 35/35]
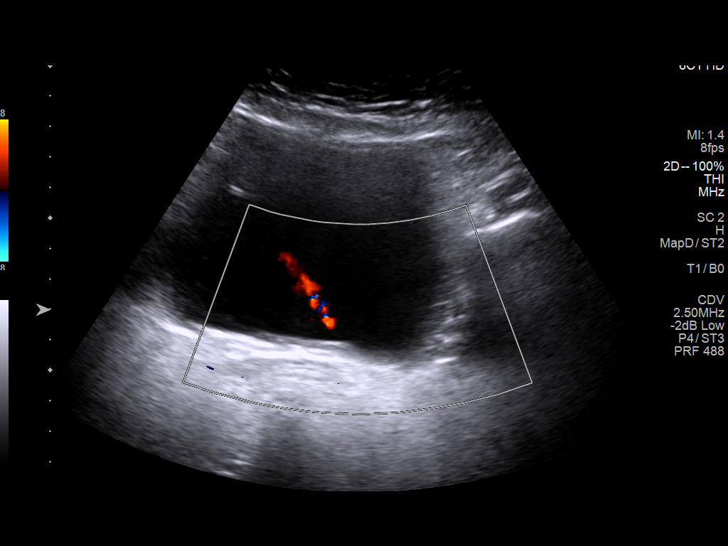

[14 of 25 positions shown; findings below may reference images not displayed]

FINDINGS: Right Kidney:

Renal measurements: 8.5 x 4.0 x 3.6 cm = volume: 65 mL. Echogenicity
within normal limits. No mass or hydronephrosis visualized.
Increased cortical echogenicity.

Left Kidney:

Renal measurements: 9.1 x 4.6 x 3.8 cm = volume: 83 mL. Echogenicity
within normal limits. No mass or hydronephrosis visualized.
Increased cortical echogenicity.

Bladder:

Appears normal for degree of bladder distention.

Other:

None.
IMPRESSION: 1. Increased echogenicity in the renal cortices bilaterally
consistent with medical renal disease. No other abnormalities.

## 2024-04-26 ENCOUNTER — Ambulatory Visit: Payer: 59

## 2024-04-26 DIAGNOSIS — I4901 Ventricular fibrillation: Secondary | ICD-10-CM

## 2024-04-27 LAB — CUP PACEART REMOTE DEVICE CHECK
Battery Remaining Longevity: 30 mo
Battery Remaining Percentage: 35 %
Brady Statistic RV Percent Paced: 0 %
Date Time Interrogation Session: 20251229044200
HighPow Impedance: 64 Ohm
Implantable Lead Connection Status: 753985
Implantable Lead Implant Date: 20071205
Implantable Lead Location: 753860
Implantable Lead Model: 137
Implantable Lead Serial Number: 102363
Implantable Pulse Generator Implant Date: 20141105
Lead Channel Impedance Value: 425 Ohm
Lead Channel Pacing Threshold Amplitude: 0.5 V
Lead Channel Pacing Threshold Pulse Width: 0.5 ms
Lead Channel Setting Pacing Amplitude: 2 V
Lead Channel Setting Pacing Pulse Width: 0.5 ms
Lead Channel Setting Sensing Sensitivity: 0.6 mV
Pulse Gen Serial Number: 126146

## 2024-05-02 ENCOUNTER — Ambulatory Visit: Payer: Self-pay | Admitting: Cardiology

## 2024-05-03 ENCOUNTER — Ambulatory Visit (HOSPITAL_COMMUNITY)
Admission: RE | Admit: 2024-05-03 | Discharge: 2024-05-03 | Disposition: A | Discharge status: Home / Self Care | Source: Ambulatory Visit | Attending: Internal Medicine | Admitting: Internal Medicine

## 2024-05-03 ENCOUNTER — Encounter (HOSPITAL_COMMUNITY): Payer: Self-pay

## 2024-05-03 DIAGNOSIS — C3490 Malignant neoplasm of unspecified part of unspecified bronchus or lung: Secondary | ICD-10-CM | POA: Diagnosis present

## 2024-05-03 MED ORDER — IOHEXOL 350 MG/ML SOLN
60.0000 mL | Freq: Once | INTRAVENOUS | Status: AC | PRN
  Administered 2024-05-03: 60 mL via INTRAVENOUS

## 2024-05-04 NOTE — Progress Notes (Signed)
 Remote ICD Transmission

## 2024-05-05 NOTE — Progress Notes (Signed)
 [facility] OFFICE PROGRESS NOTE  Center, [facility] [address] [place] [address]  DIAGNOSIS: Recurrent non-small cell lung cancer initially diagnosed as stage IB (T1b, N0, M0) non-small cell lung cancer, adenocarcinoma presented with left lower lobe lung nodule diagnosed in September 2022.  She also has right lower lobe pulmonary nodule diagnosed in October 2024.   PRIOR THERAPY: 1) SBRT under the care of Dr. Shannon. Last treatment on 02/15/21.   2) SBRT to the right lower lobe lung nodule under the care of Dr. Shannon in October 2024.  CURRENT THERAPY: Observation   INTERVAL HISTORY: Karen [name] 79 y.o. female returns to the clinic today for a follow-up visit.  The patient was last seen in the clinic in July 2025 by Dr. Sherrod.  The patient is on observation for her history of stage I non-small cell lung cancer.  She is status post SBRT under the care of Dr. Shannon. She had SBRT in 2022 to the left lung. She then had SBRT to the right lung in 2024.   She has not experienced fevers, chills, or recent infections.  Over the past year, she has experienced fecal incontinence, which began in December 2024, and ongoing urinary incontinence. She is unable to control her urine or stool, describing involuntary passage. There is no perineal numbness, dysuria, or diarrhea. She uses Miralax  for constipation. No imaging or specialist evaluation has been performed for these symptoms but she states she put on medication for this. Unclear what she is taking.   She endorses new onset night sweats, waking up diaphoretic without associated fevers or chills.  She experiences progressive exertional dyspnea and has a history of COPD, managed with inhalers. She has a persistent nocturnal cough and recently had a mild upper respiratory infection, treated with cough medicine. She has not tried Delsym. Imaging has not shown any pulmonary infections.  She experiences  episodes of choking while eating, occasionally resulting in emesis. She is followed by a gastroenterologist and is up to date on colonoscopies.  She recently had a restaging CT scan.  She is here today for evaluation and to review her scan results.  MEDICAL HISTORY: Past Medical History:  Diagnosis Date   Acute myocardial infarction, unspecified site, episode of care unspecified    Adenocarcinoma, lung (HCC) 02/16/2021   Allergic rhinitis, cause unspecified    CAD (coronary artery disease)    GERD (gastroesophageal reflux disease)    Headache(784.0)    History of radiation therapy    left lung SBRT 02/08/2021, 02/13/2021, 02/15/2021  Dr Lynwood Shannon   History of radiation therapy    RIght lung- 02/04/23-02/12/23-Dr. Lynwood Shannon   HTN (hypertension)    Other and unspecified hyperlipidemia    Other diseases of lung, not elsewhere classified    Postsurgical aortocoronary bypass status    hx of it.     ALLERGIES:  is allergic to codeine and novocain [procaine].  MEDICATIONS:  Current Outpatient Medications  Medication Sig Dispense Refill   acetaminophen  (TYLENOL ) 500 MG tablet Take 500 mg by mouth every 6 (six) hours as needed for headache.     albuterol  (VENTOLIN  HFA) 108 (90 Base) MCG/ACT inhaler Inhale 2 puffs into the lungs every 6 (six) hours as needed for wheezing or shortness of breath. 54 g 3   amLODipine  (NORVASC ) 5 MG tablet Take 1 tablet (5 mg total) by mouth daily. 90 tablet 3   clopidogrel  (PLAVIX ) 75 MG tablet Take 1 tablet (75 mg total) by mouth  daily. 90 tablet 3   cyanocobalamin  (VITAMIN B12) 1000 MCG tablet Take 1 tablet (1,000 mcg total) by mouth daily. 30 tablet 0   cyclobenzaprine (FLEXERIL) 10 MG tablet Take 10 mg by mouth 3 (three) times daily as needed for muscle spasms.     famotidine  (PEPCID ) 20 MG tablet Take by mouth. daily     furosemide  (LASIX ) 40 MG tablet Take 1 tablet (40 mg total) by mouth daily. 90 tablet 3   isosorbide  mononitrate (IMDUR ) 60 MG 24  hr tablet Take 1 tablet (60 mg total) by mouth 2 (two) times daily. 180 tablet 3   lisinopril  (ZESTRIL ) 10 MG tablet TAKE 1 TABLET BY MOUTH EVERY DAY 90 tablet 2   metoprolol  tartrate (LOPRESSOR ) 25 MG tablet Take 0.5 tablets (12.5 mg total) by mouth 2 (two) times daily. 90 tablet 3   naloxone (NARCAN) nasal spray 4 mg/0.1 mL SMARTSIG:Both Nares     nitroGLYCERIN  (NITROSTAT ) 0.4 MG SL tablet Place 1 tablet (0.4 mg total) under the tongue every 5 (five) minutes as needed for chest pain (X3 DOSES MAX). 75 tablet 2   oxyCODONE -acetaminophen  (PERCOCET) 7.5-325 MG tablet Take 1-2 tablets by mouth every 4 (four) hours as needed for moderate pain (back pain).     polyethylene glycol (MIRALAX  / GLYCOLAX ) 17 g packet Take 17 g by mouth daily. 14 each 0   ranolazine  (RANEXA ) 500 MG 12 hr tablet Take 1 tablet (500 mg total) by mouth 2 (two) times daily. 180 tablet 3   rosuvastatin  (CRESTOR ) 20 MG tablet Take 1 tablet (20 mg total) by mouth daily. 90 tablet 1   Tiotropium Bromide -Olodaterol (STIOLTO RESPIMAT ) 2.5-2.5 MCG/ACT AERS Inhale 2 puffs into the lungs daily. 4 g 11   VITAMIN D, ERGOCALCIFEROL, PO Take by mouth. weekly     No current facility-administered medications for this visit.    SURGICAL HISTORY:  Past Surgical History:  Procedure Laterality Date   CARDIAC CATHETERIZATION     CARDIAC DEFIBRILLATOR PLACEMENT  03/29/2006   [employer]. remote-yes.    COLONOSCOPY WITH PROPOFOL  N/A 06/30/2023   Procedure: COLONOSCOPY WITH PROPOFOL ;  Surgeon: Dianna Specking, MD;  Location: [facility];  Service: Gastroenterology;  Laterality: N/A;   CORONARY ARTERY BYPASS GRAFT     x4   CORONARY STENT INTERVENTION N/A 05/11/2022   Procedure: CORONARY STENT INTERVENTION;  Surgeon: Wonda Sharper, MD;  Location: [facility] INVASIVE CV LAB;  Service: Cardiovascular;  Laterality: N/A;   CORONARY/GRAFT ACUTE MI REVASCULARIZATION N/A 05/08/2022   Procedure: Coronary/Graft Acute MI Revascularization;  Surgeon: Burnard Debby LABOR, MD;  Location: [facility] INVASIVE CV LAB;  Service: Cardiovascular;  Laterality: N/A;   IMPLANTABLE CARDIOVERTER DEFIBRILLATOR GENERATOR CHANGE N/A 03/03/2013   Procedure: IMPLANTABLE CARDIOVERTER DEFIBRILLATOR GENERATOR CHANGE;  Surgeon: Elspeth JAYSON Sage, MD;  Location: [facility] CATH LAB;  Service: Cardiovascular;  Laterality: N/A;   IR ANGIOGRAM VISCERAL SELECTIVE  05/06/2022   IR AORTAGRAM ABDOMINAL SERIALOGRAM  05/06/2022   IR EMBO ART  VEN HEMORR LYMPH EXTRAV  INC GUIDE ROADMAPPING  05/06/2022   IR US  GUIDE VASC ACCESS RIGHT  05/06/2022   LEFT HEART CATH AND CORONARY ANGIOGRAPHY N/A 05/08/2022   Procedure: LEFT HEART CATH AND CORONARY ANGIOGRAPHY;  Surgeon: Burnard Debby LABOR, MD;  Location: [facility] INVASIVE CV LAB;  Service: Cardiovascular;  Laterality: N/A;   LEFT HEART CATH AND CORS/GRAFTS ANGIOGRAPHY N/A 05/11/2022   Procedure: LEFT HEART CATH AND CORS/GRAFTS ANGIOGRAPHY;  Surgeon: Wonda Sharper, MD;  Location: [facility] INVASIVE CV LAB;  Service: Cardiovascular;  Laterality: N/A;  POLYPECTOMY  06/30/2023   Procedure: POLYPECTOMY;  Surgeon: Dianna Specking, MD;  Location: [facility];  Service: Gastroenterology;;   stent implant     x1. possibly x2.     REVIEW OF SYSTEMS:   Review of Systems  Constitutional: Stable fatigue. Negative for appetite change, chills, fever and unexpected weight change.  HENT: Occasional dysphagia. Negative for mouth sores, nosebleeds, sore throat and trouble swallowing.   Eyes: Negative for eye problems and icterus.  Respiratory: Positive for dyspnea on exertion. Positive for occasional cough, especially at night. Negative for hemoptysis and wheezing.   Cardiovascular: Negative for chest pain and leg swelling.  Gastrointestinal: Positive for occasional constipation and fecal incontinence. Positive for occasional dysphagia. Negative for abdominal pain, diarrhea, nausea and vomiting.  Genitourinary: Positive for urinary urgency and incontinence. Negative for bladder incontinence,  difficulty urinating, dysuria, frequency and hematuria.   Musculoskeletal: Negative for back pain, gait problem, neck pain and neck stiffness.  Skin: Negative for itching and rash.  Neurological: Negative for dizziness, extremity weakness, gait problem, headaches, light-headedness and seizures.  Hematological: Negative for adenopathy. Does not bruise/bleed easily.  Psychiatric/Behavioral: Negative for confusion, depression and sleep disturbance. The patient is not nervous/anxious.     PHYSICAL EXAMINATION:  Blood pressure 100/73, pulse 81, temperature 97.9 F (36.6 C), temperature source Temporal, resp. rate 17, weight 140 lb (63.5 kg), SpO2 100%.  ECOG PERFORMANCE STATUS: 1  Physical Exam  Constitutional: Oriented to person, place, and time and thin appearing female, and in no distress.  HENT:  Head: Normocephalic and atraumatic.  Mouth/Throat: Oropharynx is clear and moist. No oropharyngeal exudate.  Eyes: Conjunctivae are normal. Right eye exhibits no discharge. Left eye exhibits no discharge. No scleral icterus.  Neck: Normal range of motion. Neck supple.  Cardiovascular: Normal rate, regular rhythm, normal heart sounds and intact distal pulses.   Pulmonary/Chest: Effort normal and breath sounds normal. No respiratory distress. No wheezes. No rales.  Abdominal: Soft. Bowel sounds are normal. Exhibits no distension and no mass. There is no tenderness.  Musculoskeletal: Normal range of motion. Exhibits no edema.  Lymphadenopathy:    No cervical adenopathy.  Neurological: Alert and oriented to person, place, and time. Exhibits muscle wasting.. Examined in the wheelchair.  Skin: Skin is warm and dry. No rash noted. Not diaphoretic. No erythema. No pallor.  Psychiatric: Mood, memory and judgment normal.  Vitals reviewed.  LABORATORY DATA: Lab Results  Component Value Date   WBC 4.2 03/29/2024   HGB 13.0 03/29/2024   HCT 39.1 03/29/2024   MCV 94.4 03/29/2024   PLT 179 03/29/2024       Chemistry      Component Value Date/Time   NA 143 03/29/2024 1000   NA 142 06/06/2022 1423   K 3.5 03/29/2024 1000   CL 103 03/29/2024 1000   CO2 27 03/29/2024 1000   BUN 13 03/29/2024 1000   BUN 12 06/06/2022 1423   CREATININE 1.16 (H) 03/29/2024 1000      Component Value Date/Time   CALCIUM  9.6 03/29/2024 1000   ALKPHOS 58 03/29/2024 1000   AST 23 03/29/2024 1000   ALT 15 03/29/2024 1000   BILITOT 0.5 03/29/2024 1000       RADIOGRAPHIC STUDIES:  CT Chest W Contrast Result Date: 05/03/2024 CLINICAL DATA:  Non-small cell lung cancer (NSCLC), staging. * Tracking Code: BO * EXAM: CT CHEST WITH CONTRAST TECHNIQUE: Multidetector CT imaging of the chest was performed during intravenous contrast administration. RADIATION DOSE REDUCTION: This exam was performed according  to the departmental dose-optimization program which includes automated exposure control, adjustment of the mA and/or kV according to patient size and/or use of iterative reconstruction technique. CONTRAST:  60mL OMNIPAQUE  IOHEXOL  350 MG/ML SOLN COMPARISON:  CT scan chest from 11/21/2023. FINDINGS: Cardiovascular: Normal cardiac size. No pericardial effusion. No aortic aneurysm. There are coronary artery calcifications, in keeping with coronary artery disease. There are also moderate to severe peripheral atherosclerotic vascular calcifications of thoracic aorta and its major branches. Mediastinum/Nodes: Visualized thyroid gland appears grossly unremarkable. No solid / cystic mediastinal masses. The esophagus is nondistended precluding optimal assessment. There are few mildly prominent mediastinal lymph nodes, which do not meet the size criteria for lymphadenopathy and appear grossly similar to the prior study, favoring benign etiology. No axillary or hilar lymphadenopathy by size criteria. Lungs/Pleura: The central tracheo-bronchial tree is patent. Redemonstration of airspace opacities in the bilateral lungs. The opacity in  the superior segment of left lower lobe measures 2.6 x 4.4 cm, which previously measured up to 2.2 x 3.8 cm. The opacity in the right lower lobe, currently measures 1.9 x 2.6 cm, which previously measured up to 2.1 x 2.4 cm. Overall, no significant interval change. Both opacities exhibit surrounding areas of atelectasis/scarring are also essentially unchanged. No new mass or consolidation. No pleural effusion or pneumothorax. There is a stable part solid nodule in the left upper lobe measuring 7 x 9 mm orthogonally on axial plane (series 7, image 57). There is also stable pleural-based, subcentimeter sized nodule in the right lung lower lobe, posterior inferiorly (series 7, image 114). There is resolution of previously noted 4 mm solid nodule in the right lower lobe. No new or suspicious lung nodules. Upper Abdomen: There is mild diffuse hepatic steatosis. There are several subcentimeter sized hypoattenuating foci in the liver, which are too small to adequately characterize but appears similar to the prior study. Surgically absent gallbladder. There is a tiny sliding hiatal hernia. Visualized upper abdominal viscera within normal limits. Musculoskeletal: The visualized soft tissues of the chest wall are grossly unremarkable. Left-sided single lead ICD noted. Patient is status post CABG. No suspicious osseous lesions. There are mild multilevel degenerative changes in the visualized spine. IMPRESSION: 1. Redemonstration of airspace opacities in the bilateral lungs, essentially unchanged since the prior study. No new lung mass or consolidation. No new or suspicious lung nodule. 2. Multiple other nonacute observations, as described above. Aortic Atherosclerosis (ICD10-I70.0). Electronically Signed   By: Ree Molt M.D.   On: 05/03/2024 14:24   CUP PACEART REMOTE DEVICE CHECK Result Date: 04/27/2024 ICD Scheduled remote reviewed. Normal device function.  Presenting rhythm: Regular VS Next remote transmission per  protocol. LA, CVRS    ASSESSMENT/PLAN:  This is a very pleasant 79 year old African-American female with stage Ib (T1b, N0, M0) non-small cell lung cancer, adenocarcinoma.  She presented with a left lower lobe lung nodule in September 2022.   She is status post SBRT under the care of Dr. Shannon.  Her last treatment was on 02/15/2021.  She then was found to have bilateral pulmonary opacities suspicious for low-grade adenocarcinoma but inflammatory process could not be excluded.  Then she had a repeat CT scan October 2023 that showed stable disease and stable bilateral nodules.  She underwent SBRT to a right lower lobe lung nodule under the care of Dr. Shannon in October 2024.  She is currently on observation.  The patient was seen with Dr. Sherrod today.  Dr. Sherrod personally and independently reviewed the scan and discussed  results with the patient today.  The scan showed no evidence for disease progression.  Dr. Sherrod recommends she continue on observation with repeat imaging in 6 months.   She was encouraged to follow up with her PCP regarding her urinary incontinence which has been going on for a year.   I also encouraged her to follow up with her gastroenterologist. She is up to date on colonoscopy per chart review. Encouraged her to follow up for evaluation of the occasional dysphagia and fecal incontinence. Let her know management will depend on underlying cause.   The patient was advised to call immediately if she has any concerning symptoms in the interval. The patient voices understanding of current disease status and treatment options and is in agreement with the current care plan. All questions were answered. The patient knows to call the clinic with any problems, questions or concerns. We can certainly see the patient much sooner if necessary   Orders Placed This Encounter  Procedures   CT Chest W Contrast    Standing Status:   Future    Expected Date:   11/07/2024     Expiration Date:   05/10/2025    If indicated for the ordered procedure, I authorize the administration of contrast media per Radiology protocol:   Yes    Does the patient have a contrast media/X-ray dye allergy?:   No    Preferred imaging location?:   [facility]   CBC with Differential (Cancer Center Only)    Standing Status:   Future    Expected Date:   11/07/2024    Expiration Date:   05/10/2025   CMP (Cancer Center only)    Standing Status:   Future    Expected Date:   11/07/2024    Expiration Date:   05/10/2025      Amirrah Quigley L Zanaya Baize, PA-C 05/10/2024  ADDENDUM: Hematology/Oncology Attending: I had a face-to-face encounter with the patient today.  I reviewed her records, lab, scan and recommended her care plan.  This is a very pleasant 79 years old African-American female with recurrent non-small cell lung cancer, adenocarcinoma that was initially diagnosed as a stage Ib presented with left lower lobe in September 2022 treated with SBRT and then in October 2024 she developed right lower lobe pulmonary nodule also treated with SBRT and the patient has been on observation since that time.  She is feeling fine today with no concerning complaints except for intermittent incontinence to urine and bowel movement.  This has been going on for a long time.  She has no weakness or sensory issues in her lower extremities.  The patient had repeat CT scan of the chest performed recently.  I personally and independently reviewed the scan and discussed the result with the patient today.  Her scan showed no concerning findings for disease progression. I recommended for her to continue on observation with repeat CT scan of the chest in 6 months. The patient was advised to call immediately if she has any other concerning symptoms in the interval Disclaimer: This note was dictated with voice recognition software. Similar sounding words can inadvertently be transcribed and may be missed upon  review. Sherrod MARLA Sherrod, MD

## 2024-05-10 ENCOUNTER — Inpatient Hospital Stay: Attending: Physician Assistant | Admitting: Physician Assistant

## 2024-05-10 VITALS — BP 100/73 | HR 81 | Temp 97.9°F | Resp 17 | Wt 140.0 lb

## 2024-05-10 DIAGNOSIS — C3490 Malignant neoplasm of unspecified part of unspecified bronchus or lung: Secondary | ICD-10-CM | POA: Diagnosis not present

## 2024-05-10 DIAGNOSIS — C3491 Malignant neoplasm of unspecified part of right bronchus or lung: Secondary | ICD-10-CM | POA: Diagnosis not present

## 2024-10-25 ENCOUNTER — Encounter

## 2024-11-08 ENCOUNTER — Inpatient Hospital Stay

## 2024-11-15 ENCOUNTER — Inpatient Hospital Stay: Admitting: Physician Assistant

## 2025-01-24 ENCOUNTER — Encounter

## 2025-04-25 ENCOUNTER — Encounter

## 2025-07-25 ENCOUNTER — Encounter
